# Patient Record
Sex: Male | Born: 1945 | Race: Black or African American | Hispanic: No | Smoking: Former smoker
Health system: Southern US, Community
[De-identification: ages and names within clinical notes are randomized; demographics above are authoritative.]

## PROBLEM LIST (undated history)

## (undated) DIAGNOSIS — I10 Essential (primary) hypertension: Secondary | ICD-10-CM

## (undated) DIAGNOSIS — E785 Hyperlipidemia, unspecified: Secondary | ICD-10-CM

## (undated) DIAGNOSIS — H9190 Unspecified hearing loss, unspecified ear: Secondary | ICD-10-CM

## (undated) DIAGNOSIS — E119 Type 2 diabetes mellitus without complications: Secondary | ICD-10-CM

## (undated) DIAGNOSIS — E1121 Type 2 diabetes mellitus with diabetic nephropathy: Secondary | ICD-10-CM

## (undated) DIAGNOSIS — I2510 Atherosclerotic heart disease of native coronary artery without angina pectoris: Secondary | ICD-10-CM

## (undated) DIAGNOSIS — Z9889 Other specified postprocedural states: Secondary | ICD-10-CM

## (undated) DIAGNOSIS — E669 Obesity, unspecified: Secondary | ICD-10-CM

## (undated) DIAGNOSIS — Z974 Presence of external hearing-aid: Secondary | ICD-10-CM

## (undated) DIAGNOSIS — I719 Aortic aneurysm of unspecified site, without rupture: Secondary | ICD-10-CM

## (undated) DIAGNOSIS — I712 Thoracic aortic aneurysm, without rupture: Secondary | ICD-10-CM

## (undated) DIAGNOSIS — I2699 Other pulmonary embolism without acute cor pulmonale: Secondary | ICD-10-CM

## (undated) DIAGNOSIS — E1129 Type 2 diabetes mellitus with other diabetic kidney complication: Secondary | ICD-10-CM

## (undated) DIAGNOSIS — R809 Proteinuria, unspecified: Secondary | ICD-10-CM

## (undated) DIAGNOSIS — Z86010 Personal history of colonic polyps: Secondary | ICD-10-CM

## (undated) DIAGNOSIS — Z87891 Personal history of nicotine dependence: Secondary | ICD-10-CM

## (undated) HISTORY — DX: Essential (primary) hypertension: I10

## (undated) HISTORY — DX: Type 2 diabetes mellitus without complications: E11.9

## (undated) HISTORY — DX: Other specified postprocedural states: Z98.890

## (undated) HISTORY — DX: Aortic aneurysm of unspecified site, without rupture: I71.9

## (undated) HISTORY — DX: Obesity, unspecified: E66.9

## (undated) HISTORY — DX: Type 2 diabetes mellitus with other diabetic kidney complication: E11.29

## (undated) HISTORY — DX: Personal history of nicotine dependence: Z87.891

## (undated) HISTORY — DX: Proteinuria, unspecified: R80.9

## (undated) HISTORY — DX: Thoracic aortic aneurysm, without rupture: I71.2

## (undated) HISTORY — PX: EYE SURGERY: SHX253

## (undated) HISTORY — DX: Hyperlipidemia, unspecified: E78.5

## (undated) HISTORY — PX: FRACTURE SURGERY: SHX138

## (undated) HISTORY — DX: Other pulmonary embolism without acute cor pulmonale: I26.99

## (undated) HISTORY — DX: Personal history of colonic polyps: Z86.010

## (undated) HISTORY — PX: TOTAL HIP ARTHROPLASTY: SHX124

---

## 2003-11-03 ENCOUNTER — Other Ambulatory Visit: Payer: Self-pay

## 2005-12-27 ENCOUNTER — Emergency Department: Payer: Self-pay | Admitting: Emergency Medicine

## 2006-03-30 ENCOUNTER — Emergency Department: Payer: Self-pay | Admitting: Emergency Medicine

## 2007-06-27 ENCOUNTER — Ambulatory Visit: Payer: Self-pay | Admitting: Family Medicine

## 2008-10-05 ENCOUNTER — Ambulatory Visit: Payer: Self-pay | Admitting: Family Medicine

## 2008-10-12 ENCOUNTER — Ambulatory Visit: Payer: Self-pay | Admitting: Family Medicine

## 2011-07-19 ENCOUNTER — Ambulatory Visit: Payer: Self-pay | Admitting: Ophthalmology

## 2011-08-08 ENCOUNTER — Ambulatory Visit: Payer: Self-pay | Admitting: Ophthalmology

## 2012-11-28 ENCOUNTER — Ambulatory Visit: Payer: Self-pay | Admitting: Family Medicine

## 2012-12-04 ENCOUNTER — Ambulatory Visit: Payer: Self-pay | Admitting: Family Medicine

## 2013-01-04 ENCOUNTER — Ambulatory Visit: Payer: Self-pay | Admitting: Family Medicine

## 2013-02-03 ENCOUNTER — Ambulatory Visit: Payer: Self-pay | Admitting: Family Medicine

## 2013-08-04 ENCOUNTER — Ambulatory Visit: Payer: Self-pay | Admitting: Podiatry

## 2013-09-18 ENCOUNTER — Ambulatory Visit (INDEPENDENT_AMBULATORY_CARE_PROVIDER_SITE_OTHER): Payer: Medicare PPO

## 2013-09-18 ENCOUNTER — Ambulatory Visit (INDEPENDENT_AMBULATORY_CARE_PROVIDER_SITE_OTHER): Payer: Medicare PPO | Admitting: Podiatry

## 2013-09-18 ENCOUNTER — Encounter: Payer: Self-pay | Admitting: Podiatry

## 2013-09-18 VITALS — BP 139/83 | HR 88 | Resp 18 | Ht 72.0 in | Wt 280.0 lb

## 2013-09-18 DIAGNOSIS — M79609 Pain in unspecified limb: Secondary | ICD-10-CM

## 2013-09-18 DIAGNOSIS — M109 Gout, unspecified: Secondary | ICD-10-CM

## 2013-09-18 DIAGNOSIS — M779 Enthesopathy, unspecified: Secondary | ICD-10-CM

## 2013-09-18 DIAGNOSIS — M79671 Pain in right foot: Secondary | ICD-10-CM

## 2013-09-18 MED ORDER — TRIAMCINOLONE ACETONIDE 10 MG/ML IJ SUSP
10.0000 mg | Freq: Once | INTRAMUSCULAR | Status: AC
Start: 2013-09-18 — End: 2013-09-18
  Administered 2013-09-18: 10 mg

## 2013-09-18 NOTE — Progress Notes (Signed)
Right foot swollen and painful started about the middle of last week

## 2013-09-18 NOTE — Progress Notes (Signed)
Subjective:     Patient ID: Edgar [name], male   DOB: [birth year], 68 y.o.   MRN: [id number]  HPI patient states that his right big toe joint started swelling last Wednesday and it's been painful since. States he's not had a history of this prior and was doing well until this event occurred   Review of Systems     Objective:   Physical Exam Neurovascular status unchanged with patient well oriented x3. 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 I did a careful injection around the joint 3 mg Kenalog 5 mm Xylocaine Marcaine mixture and sent for blood work to rule out gout or inflammatory systemic arthritis. Reappoint 2 weeks

## 2013-09-19 LAB — RHEUMATOID FACTOR: Rhuematoid fact SerPl-aCnc: <7 IU/mL (ref 0.0–13.9)

## 2013-09-19 LAB — URIC ACID: Uric Acid: 5.2 mg/dL (ref 3.7–8.6)

## 2013-09-19 LAB — SEDIMENTATION RATE: Sed Rate: 22 mm/hr (ref 0–30)

## 2013-09-19 LAB — C-REACTIVE PROTEIN: CRP: 7.3 mg/L — ABNORMAL HIGH (ref 0.0–4.9)

## 2013-09-19 LAB — ANA: Anti Nuclear Antibody(ANA): NEGATIVE

## 2013-10-02 ENCOUNTER — Encounter: Payer: Self-pay | Admitting: Podiatry

## 2013-10-02 ENCOUNTER — Ambulatory Visit: Payer: Self-pay | Admitting: Podiatry

## 2013-10-02 ENCOUNTER — Ambulatory Visit (INDEPENDENT_AMBULATORY_CARE_PROVIDER_SITE_OTHER): Payer: Medicare PPO | Admitting: Podiatry

## 2013-10-02 VITALS — BP 110/70 | HR 91 | Resp 16 | Ht 72.0 in | Wt 278.2 lb

## 2013-10-02 DIAGNOSIS — M2010 Hallux valgus (acquired), unspecified foot: Secondary | ICD-10-CM

## 2013-10-02 DIAGNOSIS — M779 Enthesopathy, unspecified: Secondary | ICD-10-CM

## 2013-10-04 NOTE — Progress Notes (Signed)
Subjective:     Patient ID: Edgar [name], male   DOB: [birth year], 68 y.o.   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 reactive protein but no other indications of systemic disease. Advised on wider shoes and bunion correction if symptoms persist

## 2014-07-26 ENCOUNTER — Ambulatory Visit: Payer: Self-pay | Admitting: Gastroenterology

## 2014-10-21 ENCOUNTER — Emergency Department: Payer: Self-pay | Admitting: Emergency Medicine

## 2014-11-28 NOTE — Op Note (Signed)
PATIENT NAME:  Edgar [name], LANZER MR#:  [id number] DATE OF BIRTH:  [birth year]  DATE OF PROCEDURE:  08/08/2011  PREOPERATIVE DIAGNOSIS: Visually significant cataract of the left eye.   POSTOPERATIVE DIAGNOSIS: Visually significant cataract of the left eye.   OPERATIVE PROCEDURE: Cataract extraction by phacoemulsification with implant of intraocular lens to left eye.   SURGEON: Birder Robson, MD.   ANESTHESIA:  1. Managed anesthesia care.  2. Topical tetracaine drops followed by 2% Xylocaine jelly applied in the preoperative holding area.   COMPLICATIONS: None.   TECHNIQUE:  Stop and chop   DESCRIPTION OF PROCEDURE: The patient was examined and consented in the preoperative holding area where the aforementioned topical anesthesia was applied to the left eye and then brought back to the Operating Room where the left eye was prepped and draped in the usual sterile ophthalmic fashion and a lid speculum was placed. A paracentesis was created with the side port blade and the anterior chamber was filled with viscoelastic. A near clear corneal incision was performed with the steel keratome. A continuous curvilinear capsulorrhexis was performed with a cystotome followed by the capsulorrhexis forceps. Hydrodissection and hydrodelineation were carried out with BSS on a blunt cannula. The lens was removed in a stop and chop technique and the remaining cortical material was removed with the irrigation-aspiration handpiece. The capsular bag was inflated with viscoelastic and the Alcon SN60WF 20.0-diopter lens, serial number [id number], was placed in the capsular bag without complication. The remaining viscoelastic was removed from the eye with the irrigation-aspiration handpiece. The wounds were hydrated. The anterior chamber was flushed with Miostat and the eye was inflated to physiologic pressure. The wounds were found to be water tight. The eye was dressed with Vigamox and Omnipred. The patient was given  protective glasses to wear throughout the day and a shield with which to sleep tonight. The patient was also given drops with which to begin a drop regimen today and will follow-up with me in one day.   ____________________________ Livingston Diones. Lyda Colcord, MD wlp:cbb D: 08/08/2011 17:14:02 ET T: 08/08/2011 18:43:19 ET JOB#: [id number]  cc: Xiana Carns L. Kriste Broman, MD, <Dictator> Livingston Diones Schon Zeiders MD ELECTRONICALLY SIGNED 08/14/2011 13:53

## 2015-01-25 ENCOUNTER — Ambulatory Visit (INDEPENDENT_AMBULATORY_CARE_PROVIDER_SITE_OTHER): Payer: Medicare PPO | Admitting: Family Medicine

## 2015-01-25 ENCOUNTER — Encounter: Payer: Self-pay | Admitting: Family Medicine

## 2015-01-25 VITALS — BP 138/84 | HR 74 | Temp 97.9°F | Resp 16 | Ht 71.0 in | Wt 290.1 lb

## 2015-01-25 DIAGNOSIS — IMO0002 Reserved for concepts with insufficient information to code with codable children: Secondary | ICD-10-CM

## 2015-01-25 DIAGNOSIS — E1165 Type 2 diabetes mellitus with hyperglycemia: Secondary | ICD-10-CM | POA: Diagnosis not present

## 2015-01-25 DIAGNOSIS — E785 Hyperlipidemia, unspecified: Secondary | ICD-10-CM

## 2015-01-25 DIAGNOSIS — E1121 Type 2 diabetes mellitus with diabetic nephropathy: Secondary | ICD-10-CM | POA: Insufficient documentation

## 2015-01-25 DIAGNOSIS — Z794 Long term (current) use of insulin: Secondary | ICD-10-CM | POA: Insufficient documentation

## 2015-01-25 DIAGNOSIS — I10 Essential (primary) hypertension: Secondary | ICD-10-CM

## 2015-01-25 LAB — POCT GLYCOSYLATED HEMOGLOBIN (HGB A1C): Hemoglobin A1C: 8.3

## 2015-01-25 LAB — GLUCOSE, POCT (MANUAL RESULT ENTRY): POC Glucose: 164 mg/dl — AB (ref 70–99)

## 2015-01-25 NOTE — Patient Instructions (Signed)
F/U 4 MO 

## 2015-01-25 NOTE — Progress Notes (Signed)
Name: Edgar [name]   MRN: [id number]    DOB: 1946-05-20   Date:01/25/2015       Progress Note  Subjective  Chief Complaint  Chief Complaint  Patient presents with  . Diabetes    Diabetes He presents for his follow-up diabetic visit. He has type 2 diabetes mellitus. His disease course has been worsening. There are no hypoglycemic associated symptoms. Pertinent negatives for hypoglycemia include no dizziness, headaches, nervousness/anxiousness, seizures or tremors. Pertinent negatives for diabetes include no blurred vision, no chest pain, no weakness and no weight loss. Symptoms are worsening. There are no diabetic complications. Risk factors for coronary artery disease include diabetes mellitus, dyslipidemia, hypertension, male sex, obesity, sedentary lifestyle and stress. He is currently taking insulin pre-breakfast, pre-lunch, pre-dinner and at bedtime. Insulin injections are given by patient. His weight is increasing steadily. He is following a diabetic diet. He has had a previous visit with a dietitian. He rarely participates in exercise. His home blood glucose trend is increasing steadily. An ACE inhibitor/angiotensin II receptor blocker is being taken.  Hypertension This is a chronic problem. The current episode started more than 1 year ago. Associated symptoms include peripheral edema. Pertinent negatives include no blurred vision, chest pain, headaches, neck pain, orthopnea, palpitations or shortness of breath. There are no associated agents to hypertension. Past treatments include angiotensin blockers and calcium channel blockers.  Hyperlipidemia This is a chronic problem. The current episode started more than 1 year ago. Recent lipid tests were reviewed and are normal. Exacerbating diseases include diabetes and obesity. Factors aggravating his hyperlipidemia include fatty foods. Pertinent negatives include no chest pain, focal weakness, myalgias or shortness of breath. Current  antihyperlipidemic treatment includes statins and fibric acid derivatives. The current treatment provides moderate improvement of lipids. There are no compliance problems.     Obesity  Patient continues to struggle with obesity. He does for exercise and is questionable compliant with his diet.  Past Medical History  Diagnosis Date  . Hypertension   . Diabetes mellitus without complication     History  Substance Use Topics  . Smoking status: Former Research scientist (life sciences)  . Smokeless tobacco: Never Used  . Alcohol Use: No     Current outpatient prescriptions:  .  amLODipine (NORVASC) 5 MG tablet, , Disp: , Rfl:  .  amoxicillin-clavulanate (AUGMENTIN) 875-125 MG per tablet, , Disp: , Rfl:  .  atorvastatin (LIPITOR) 40 MG tablet, , Disp: , Rfl:  .  B-D UF III MINI PEN NEEDLES 31G X 5 MM MISC, , Disp: , Rfl:  .  LANTUS SOLOSTAR 100 UNIT/ML Solostar Pen, , Disp: , Rfl:  .  losartan (COZAAR) 100 MG tablet, , Disp: , Rfl:  .  NOVOLOG FLEXPEN 100 UNIT/ML FlexPen, , Disp: , Rfl:  .  predniSONE (DELTASONE) 20 MG tablet, , Disp: , Rfl:  .  VASCEPA 1 G CAPS, TAKE 2 CAPSULES BY MOUTH EVERY MORNING THEN TAKE 2 CAPSULES EVERY EVENING, Disp: , Rfl: 5  No Known Allergies  Review of Systems  Constitutional: Negative for fever, chills and weight loss.  HENT: Negative for congestion, hearing loss, sore throat and tinnitus.   Eyes: Negative for blurred vision, double vision and redness.  Respiratory: Negative for cough, hemoptysis and shortness of breath.   Cardiovascular: Negative for chest pain, palpitations, orthopnea, claudication and leg swelling.  Gastrointestinal: Negative for heartburn, nausea, vomiting, diarrhea, constipation and blood in stool.  Genitourinary: Negative for dysuria, urgency, frequency and hematuria.  Musculoskeletal: Positive for joint pain.  Negative for myalgias, back pain, falls and neck pain.  Skin: Negative for itching.  Neurological: Negative for dizziness, tingling, tremors,  focal weakness, seizures, loss of consciousness, weakness and headaches.  Endo/Heme/Allergies: Does not bruise/bleed easily.  Psychiatric/Behavioral: Negative for depression and substance abuse. The patient is not nervous/anxious and does not have insomnia.      Objective  Filed Vitals:   01/25/15 0849  BP: 138/84  Pulse: 74  Temp: 97.9 F (36.6 C)  Resp: 16  Height: 5\' 11"  (1.803 m)  Weight: 290 lb 2 oz (131.6 kg)  SpO2: 94%     Physical Exam  Constitutional: He is oriented to person, place, and time and well-developed, well-nourished, and in no distress.  HENT:  Head: Normocephalic.  Eyes: EOM are normal. Pupils are equal, round, and reactive to light.  Neck: Normal range of motion. Neck supple. No thyromegaly present.  Cardiovascular: Normal rate, regular rhythm and normal heart sounds.   No murmur heard. Pulmonary/Chest: Effort normal and breath sounds normal. No respiratory distress. He has no wheezes.  Abdominal: Soft. Bowel sounds are normal.  Musculoskeletal: Normal range of motion. He exhibits no edema.  Lymphadenopathy:    He has no cervical adenopathy.  Neurological: He is alert and oriented to person, place, and time. No cranial nerve deficit. Gait normal. Coordination normal.  Skin: Skin is warm and dry. No rash noted.  Psychiatric: Affect and judgment normal.      Assessment & Plan  1. Type 2 diabetes mellitus without complication Well-controlled. He is encouraged to follow his diet and exercise and medical regimen. He has switched jobs and is driving quite a bit and therefore is not as active physically - POCT HgB A1C - POCT Glucose (CBG)  2. Hyperlipemia Stable  3. Essential hypertension Stable

## 2015-02-21 ENCOUNTER — Other Ambulatory Visit: Payer: Self-pay | Admitting: Family Medicine

## 2015-03-05 ENCOUNTER — Other Ambulatory Visit: Payer: Self-pay | Admitting: Family Medicine

## 2015-04-02 ENCOUNTER — Other Ambulatory Visit: Payer: Self-pay | Admitting: Family Medicine

## 2015-04-29 ENCOUNTER — Other Ambulatory Visit: Payer: Self-pay | Admitting: Family Medicine

## 2015-05-30 ENCOUNTER — Ambulatory Visit (INDEPENDENT_AMBULATORY_CARE_PROVIDER_SITE_OTHER): Payer: Medicare PPO | Admitting: Family Medicine

## 2015-05-30 ENCOUNTER — Encounter: Payer: Self-pay | Admitting: Family Medicine

## 2015-05-30 VITALS — BP 122/78 | HR 79 | Temp 98.6°F | Resp 18 | Ht 71.0 in | Wt 284.5 lb

## 2015-05-30 DIAGNOSIS — E785 Hyperlipidemia, unspecified: Secondary | ICD-10-CM | POA: Diagnosis not present

## 2015-05-30 DIAGNOSIS — Z23 Encounter for immunization: Secondary | ICD-10-CM | POA: Diagnosis not present

## 2015-05-30 DIAGNOSIS — M25519 Pain in unspecified shoulder: Secondary | ICD-10-CM | POA: Diagnosis not present

## 2015-05-30 DIAGNOSIS — E1169 Type 2 diabetes mellitus with other specified complication: Secondary | ICD-10-CM

## 2015-05-30 DIAGNOSIS — I10 Essential (primary) hypertension: Secondary | ICD-10-CM

## 2015-05-30 LAB — POCT GLYCOSYLATED HEMOGLOBIN (HGB A1C): Hemoglobin A1C: 7.1

## 2015-05-30 LAB — GLUCOSE, POCT (MANUAL RESULT ENTRY): POC Glucose: 151 mg/dl — AB (ref 70–99)

## 2015-05-30 MED ORDER — MELOXICAM 15 MG PO TABS: 15.0000 mg | ORAL_TABLET | Freq: Every day | ORAL | Status: DC

## 2015-05-30 MED ORDER — LOSARTAN POTASSIUM 100 MG PO TABS: 100.0000 mg | ORAL_TABLET | Freq: Every day | ORAL | Status: DC

## 2015-05-30 NOTE — Progress Notes (Signed)
Name: Edgar [name]   MRN: [id number]    DOB: [birth year]   Date:05/30/2015       Progress Note  Subjective  Chief Complaint  Chief Complaint  Patient presents with  . Hypertension    4 month follow up  . Diabetes  . Hyperlipidemia    would like Vascepa changed due to cost    HPI   u  Diabetes/ HYPERLIPIDEMIA  Patient presents for follow-up of diabetes which is present for  Over 5 years. Is currently on a regimen of ATORVASTATIN 40 MG EVERY VASCEPA 1 G TABLETS 2 TWICE A DAY WHICH IS NOT RECENTLY BECAUSE OF PRICE . Patient states INTERMITTENT COMPLIANCE with their diet and exercise. There's been no hypoglycemic episodes and there - polyuria polydipsia polyphagia. His average fasting glucoses been in the low around - with a high around - . There is no end organ disease.  Last diabetic eye exam was LESS THAN 1 YEAR AGO.   Last visit with dietitian was . Last microalbumin was obtained jANUARY OF THIS YEAR AND WAS ELEVATED  .   Hypertension   Patient presents for follow-up of hypertension. It has been present for over 5 years.  Patient states that there is compliance with medical regimen which consists of LOSARTAN 100 . There is no end organ disease. Cardiac risk factors include hypertension hyperlipidemia and diabetes.  Exercise regimen consist of   .  Diet consist of LIMITED WALKING SOME SALT RESTRICTION .  Hyperlipidemia  Patient has a history of hyperlipidemia for OVER 5 years.  Current medical regimen consist of LOSARTAN 100 MG DAILY AT BEDTIME c4 MILLIGRAMS DAILY fOR .  Compliance is GOOD .  Diet and exercise are currently followed INTERMITTENTLY .  Risk factors for cardiovascular disease include hyperlipidemia HYPERTENSION OBESITY SEDENTARY LIFESTYLE .   There have been no side effects from the medication.    Past Medical History  Diagnosis Date  . Hypertension   . Diabetes mellitus without complication [facility])     Social History  Substance Use Topics  . Smoking status: Former  Research scientist (life sciences)  . Smokeless tobacco: Never Used  . Alcohol Use: No     Current outpatient prescriptions:  .  amLODipine (NORVASC) 5 MG tablet, TAKE 1 TABLET EVERY DAY, Disp: 30 tablet, Rfl: 7 .  atorvastatin (LIPITOR) 40 MG tablet, TAKE 1 TABLET BY MOUTH AT BEDTIME, Disp: 30 tablet, Rfl: 7 .  B-D UF III MINI PEN NEEDLES 31G X 5 MM MISC, , Disp: , Rfl:  .  LANTUS SOLOSTAR 100 UNIT/ML Solostar Pen, , Disp: , Rfl:  .  losartan (COZAAR) 100 MG tablet, TAKE 1 TABLET BY MOUTH EVERY DAY, Disp: 90 tablet, Rfl: 1 .  NOVOLOG FLEXPEN 100 UNIT/ML FlexPen, , Disp: , Rfl:   No Known Allergies  Review of Systems  Constitutional: Negative for fever, chills and weight loss.  HENT: Positive for congestion. Negative for hearing loss, sore throat and tinnitus.   Eyes: Negative for blurred vision, double vision and redness.  Respiratory: Positive for cough and wheezing. Negative for hemoptysis and shortness of breath.   Cardiovascular: Negative for chest pain, palpitations, orthopnea, claudication and leg swelling.  Gastrointestinal: Negative for heartburn, nausea, vomiting, diarrhea, constipation and blood in stool.  Genitourinary: Negative for dysuria, urgency, frequency and hematuria.  Musculoskeletal: Negative for myalgias, back pain, joint pain, falls and neck pain.  Skin: Negative for itching.  Neurological: Negative for dizziness, tingling, tremors, focal weakness, seizures, loss of consciousness, weakness and headaches.  Endo/Heme/Allergies: Does not bruise/bleed easily.  Psychiatric/Behavioral: Negative for depression and substance abuse. The patient is not nervous/anxious and does not have insomnia.      Objective  Filed Vitals:   05/30/15 0858  BP: 122/78  Pulse: 79  Temp: 98.6 F (37 C)  TempSrc: Oral  Resp: 18  Height: 5\' 11"  (1.803 m)  Weight: 284 lb 8 oz (129.048 kg)  SpO2: 94%     Physical Exam  Constitutional: He is oriented to person, place, and time and well-developed,  well-nourished, and in no distress.  HENT:  Head: Normocephalic.  Eyes: EOM are normal. Pupils are equal, round, and reactive to light.  Neck: Normal range of motion. Neck supple. No thyromegaly present.  Cardiovascular: Normal rate, regular rhythm and normal heart sounds.   No murmur heard. Pulmonary/Chest: Effort normal and breath sounds normal. No respiratory distress. He has no wheezes.  Abdominal: Soft. Bowel sounds are normal.  Musculoskeletal: Normal range of motion. He exhibits no edema.  Lymphadenopathy:    He has no cervical adenopathy.  Neurological: He is alert and oriented to person, place, and time. No cranial nerve deficit. Gait normal. Coordination normal.  Skin: Skin is warm and dry. No rash noted.  Psychiatric: Affect and judgment normal.      Assessment & Plan   1. Need for influenza vaccination gIVEN - Flu vaccine HIGH DOSE PF (Fluzone High dose) - POCT Glucose (CBG) - Comprehensive Metabolic Panel (CMET) - TSH  2. Type 2 diabetes mellitus with other specified complication (HCC) STABLE - POCT HgB A1C  3. Hyperlipidemia NOT AT GOAL. rENEWED VASCEP - Lipid panel  4. Essential hypertension WELL-CONTROLLED - Comprehensive Metabolic Panel (CMET)  5. Shoulder pain, unspecified laterality mOBIC - meloxicam (MOBIC) 15 MG tablet; Take 1 tablet (15 mg total) by mouth daily.  Dispense: 30 tablet; Refill: 1

## 2015-05-31 LAB — COMPREHENSIVE METABOLIC PANEL
ALT: 22 IU/L (ref 0–44)
AST: 19 IU/L (ref 0–40)
Albumin/Globulin Ratio: 1.4 (ref 1.1–2.5)
Albumin: 4.3 g/dL (ref 3.6–4.8)
Alkaline Phosphatase: 78 IU/L (ref 39–117)
BUN/Creatinine Ratio: 11 (ref 10–22)
BUN: 10 mg/dL (ref 8–27)
Bilirubin Total: 0.5 mg/dL (ref 0.0–1.2)
CO2: 26 mmol/L (ref 18–29)
Calcium: 10.4 mg/dL — ABNORMAL HIGH (ref 8.6–10.2)
Chloride: 100 mmol/L (ref 97–106)
Creatinine, Ser: 0.91 mg/dL (ref 0.76–1.27)
GFR calc Af Amer: 100 mL/min/{1.73_m2} (ref >59)
GFR calc non Af Amer: 86 mL/min/{1.73_m2} (ref >59)
Globulin, Total: 3.1 g/dL (ref 1.5–4.5)
Glucose: 145 mg/dL — ABNORMAL HIGH (ref 65–99)
Potassium: 4.5 mmol/L (ref 3.5–5.2)
Sodium: 142 mmol/L (ref 136–144)
Total Protein: 7.4 g/dL (ref 6.0–8.5)

## 2015-05-31 LAB — LIPID PANEL
Chol/HDL Ratio: 3.5 ratio units (ref 0.0–5.0)
Cholesterol, Total: 141 mg/dL (ref 100–199)
HDL: 40 mg/dL (ref >39)
LDL Calculated: 81 mg/dL (ref 0–99)
Triglycerides: 101 mg/dL (ref 0–149)
VLDL Cholesterol Cal: 20 mg/dL (ref 5–40)

## 2015-05-31 LAB — TSH: TSH: 3.47 u[IU]/mL (ref 0.450–4.500)

## 2015-06-27 DIAGNOSIS — H2511 Age-related nuclear cataract, right eye: Secondary | ICD-10-CM | POA: Diagnosis not present

## 2015-07-04 ENCOUNTER — Ambulatory Visit
Admission: RE | Admit: 2015-07-04 | Discharge: 2015-07-04 | Disposition: A | Discharge status: Home / Self Care | Payer: Medicare PPO | Source: Ambulatory Visit | Attending: Family Medicine | Admitting: Family Medicine

## 2015-07-04 ENCOUNTER — Ambulatory Visit (INDEPENDENT_AMBULATORY_CARE_PROVIDER_SITE_OTHER): Payer: Medicare PPO | Admitting: Family Medicine

## 2015-07-04 ENCOUNTER — Encounter: Payer: Self-pay | Admitting: Family Medicine

## 2015-07-04 VITALS — BP 140/86 | HR 86 | Temp 97.3°F | Resp 18 | Ht 71.0 in | Wt 288.2 lb

## 2015-07-04 DIAGNOSIS — E1121 Type 2 diabetes mellitus with diabetic nephropathy: Secondary | ICD-10-CM

## 2015-07-04 DIAGNOSIS — M25511 Pain in right shoulder: Secondary | ICD-10-CM | POA: Diagnosis not present

## 2015-07-04 MED ORDER — HYDROCODONE-ACETAMINOPHEN 10-325 MG PO TABS: 1.0000 | ORAL_TABLET | Freq: Three times a day (TID) | ORAL | Status: DC | PRN

## 2015-07-04 NOTE — Progress Notes (Signed)
Name: Edgar [name]   MRN: [id number]    DOB: 03-May-1946   Date:07/04/2015       Progress Note  Subjective  Chief Complaint  Chief Complaint  Patient presents with  . Shoulder Injury    fell 2 months ago. Pain worse, radiates down arm to hand    HPI  Patient fell 2 weeks ago onto his right shoulder. The pain has continued to worsen and now radiates down the arm to the right hand. The incident happened when a locker at school fell onto his right shoulder knocking him to the ground. He does not remember stretching his right hand out because it happened so quickly. He has had pain in the area which is sharp and throbbing since that time. He is already on Mobic with no significant improvement. He is noting limitation of abduction to no more than 90. The pain often keeps him from sleeping at night.  Diabetes mellitus with nephropathy.  Patient states his blood sugars have not significantly changed recently. His diabetes for over 5 years with microalbuminuria    Past Medical History  Diagnosis Date  . Hypertension   . Diabetes mellitus without complication [facility])     Social History  Substance Use Topics  . Smoking status: Former Research scientist (life sciences)  . Smokeless tobacco: Never Used  . Alcohol Use: No     Current outpatient prescriptions:  .  amLODipine (NORVASC) 5 MG tablet, TAKE 1 TABLET EVERY DAY, Disp: 30 tablet, Rfl: 7 .  atorvastatin (LIPITOR) 40 MG tablet, TAKE 1 TABLET BY MOUTH AT BEDTIME, Disp: 30 tablet, Rfl: 7 .  B-D UF III MINI PEN NEEDLES 31G X 5 MM MISC, , Disp: , Rfl:  .  LANTUS SOLOSTAR 100 UNIT/ML Solostar Pen, , Disp: , Rfl:  .  losartan (COZAAR) 100 MG tablet, Take 1 tablet (100 mg total) by mouth daily., Disp: 90 tablet, Rfl: 1 .  meloxicam (MOBIC) 15 MG tablet, Take 1 tablet (15 mg total) by mouth daily., Disp: 30 tablet, Rfl: 1 .  NOVOLOG FLEXPEN 100 UNIT/ML FlexPen, , Disp: , Rfl:  .  PRODIGY NO CODING BLOOD GLUC test strip, , Disp: , Rfl:   No Known  Allergies  Review of Systems  Constitutional: Negative for fever, chills and weight loss.  HENT: Negative for congestion, hearing loss, sore throat and tinnitus.   Eyes: Negative for blurred vision, double vision and redness.  Respiratory: Negative for cough, hemoptysis and shortness of breath.   Cardiovascular: Negative for chest pain, palpitations, orthopnea, claudication and leg swelling.  Gastrointestinal: Negative for heartburn, nausea, vomiting, diarrhea, constipation and blood in stool.  Genitourinary: Negative for dysuria, urgency, frequency and hematuria.  Musculoskeletal: Positive for joint pain (right shoulder pain with limitation of abduction). Negative for myalgias, back pain, falls and neck pain.  Skin: Negative for itching.  Neurological: Negative for dizziness, tingling, tremors, focal weakness, seizures, loss of consciousness, weakness and headaches.  Endo/Heme/Allergies: Does not bruise/bleed easily.  Psychiatric/Behavioral: Negative for depression and substance abuse. The patient is not nervous/anxious and does not have insomnia.      Objective  Filed Vitals:   07/04/15 0940  BP: 140/86  Pulse: 86  Temp: 97.3 F (36.3 C)  TempSrc: Oral  Resp: 18  Height: 5\' 11"  (1.803 m)  Weight: 288 lb 3.2 oz (130.727 kg)  SpO2: 95%     Physical Exam  Constitutional:  Obese and in no acute distress  HENT:  Head: Normocephalic.  Eyes: Pupils are equal, round,  and reactive to light.  Neck: Normal range of motion.  Musculoskeletal:  Discharge and redness to palpation about the right shoulder. Abduction is limited to 90 both packed passively and actively. Internal and external rotation are unremarkable. Normal pulses. There is no limitation of rotation of the C-spine beyond that expected for his age      Assessment & Plan  1. Shoulder pain, acute, right Probable rotator cuff syndrome - HYDROcodone-acetaminophen (NORCO) 10-325 MG tablet; Take 1 tablet by mouth every  8 (eight) hours as needed.  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

## 2015-07-06 ENCOUNTER — Telehealth: Payer: Self-pay | Admitting: Emergency Medicine

## 2015-07-06 NOTE — Telephone Encounter (Signed)
Patient notified of results. Orthopedic appointment scheduled for 12/2

## 2015-07-08 DIAGNOSIS — S40011A Contusion of right shoulder, initial encounter: Secondary | ICD-10-CM | POA: Diagnosis not present

## 2015-07-25 ENCOUNTER — Encounter: Payer: Self-pay | Admitting: Family Medicine

## 2015-08-10 ENCOUNTER — Other Ambulatory Visit: Payer: Self-pay | Admitting: Family Medicine

## 2015-09-05 ENCOUNTER — Other Ambulatory Visit: Payer: Self-pay | Admitting: Family Medicine

## 2015-10-03 ENCOUNTER — Ambulatory Visit: Payer: Medicare PPO | Admitting: Family Medicine

## 2015-10-09 ENCOUNTER — Other Ambulatory Visit: Payer: Self-pay | Admitting: Family Medicine

## 2015-10-10 ENCOUNTER — Telehealth: Payer: Self-pay | Admitting: Family Medicine

## 2015-10-10 NOTE — Telephone Encounter (Signed)
Patient went to pharmacy to get refill on lantus but was told that he was not able to get it refilled until the 18th. He only have one day left of the medication. He is also running out of novolog. Would like to know if we have any samples of lantus pen and novolog. Please return call

## 2015-10-11 NOTE — Telephone Encounter (Signed)
Patient was given one box of Lantus

## 2015-10-12 NOTE — Telephone Encounter (Signed)
done

## 2015-10-18 ENCOUNTER — Other Ambulatory Visit: Payer: Self-pay

## 2015-10-18 DIAGNOSIS — M25511 Pain in right shoulder: Secondary | ICD-10-CM

## 2015-10-18 MED ORDER — HYDROCODONE-ACETAMINOPHEN 10-325 MG PO TABS: 1.0000 | ORAL_TABLET | Freq: Three times a day (TID) | ORAL | Status: DC | PRN

## 2015-10-27 ENCOUNTER — Other Ambulatory Visit: Payer: Self-pay | Admitting: Family Medicine

## 2015-10-27 MED ORDER — GLUCOSE BLOOD VI STRP: ORAL_STRIP | Status: DC

## 2015-10-27 MED ORDER — ONETOUCH BASIC SYSTEM W/DEVICE KIT: 1.0000 | PACK | Freq: Once | Status: DC

## 2015-10-27 NOTE — Telephone Encounter (Signed)
Insurance will no longer cover Novolog per orders 2017 Humalog is covered for 2017. Patient has enough to last for 2 months. Will change at appointment in [place]

## 2015-10-28 ENCOUNTER — Other Ambulatory Visit: Payer: Self-pay

## 2015-10-28 MED ORDER — ONETOUCH ULTRA 2 W/DEVICE KIT: 1.0000 | PACK | Freq: Two times a day (BID) | Status: DC

## 2015-11-02 ENCOUNTER — Other Ambulatory Visit: Payer: Self-pay

## 2015-11-08 ENCOUNTER — Ambulatory Visit: Payer: Medicare PPO | Admitting: Family Medicine

## 2015-12-17 ENCOUNTER — Other Ambulatory Visit: Payer: Self-pay | Admitting: Family Medicine

## 2015-12-19 ENCOUNTER — Other Ambulatory Visit: Payer: Self-pay

## 2015-12-20 MED ORDER — INSULIN PEN NEEDLE 31G X 5 MM MISC: Status: DC

## 2015-12-20 NOTE — Telephone Encounter (Signed)
Pt has appt on 12/23/15; Rx approved

## 2015-12-23 ENCOUNTER — Encounter: Payer: Self-pay | Admitting: Family Medicine

## 2015-12-23 ENCOUNTER — Ambulatory Visit (INDEPENDENT_AMBULATORY_CARE_PROVIDER_SITE_OTHER): Payer: Medicare Other | Admitting: Family Medicine

## 2015-12-23 VITALS — BP 118/80 | HR 87 | Temp 98.8°F | Resp 16 | Wt 279.0 lb

## 2015-12-23 DIAGNOSIS — E119 Type 2 diabetes mellitus without complications: Secondary | ICD-10-CM | POA: Diagnosis not present

## 2015-12-23 DIAGNOSIS — Z87891 Personal history of nicotine dependence: Secondary | ICD-10-CM | POA: Diagnosis not present

## 2015-12-23 DIAGNOSIS — J0100 Acute maxillary sinusitis, unspecified: Secondary | ICD-10-CM | POA: Insufficient documentation

## 2015-12-23 DIAGNOSIS — I10 Essential (primary) hypertension: Secondary | ICD-10-CM | POA: Insufficient documentation

## 2015-12-23 DIAGNOSIS — E785 Hyperlipidemia, unspecified: Secondary | ICD-10-CM | POA: Diagnosis not present

## 2015-12-23 DIAGNOSIS — E669 Obesity, unspecified: Secondary | ICD-10-CM

## 2015-12-23 DIAGNOSIS — J0101 Acute recurrent maxillary sinusitis: Secondary | ICD-10-CM | POA: Diagnosis not present

## 2015-12-23 DIAGNOSIS — E782 Mixed hyperlipidemia: Secondary | ICD-10-CM | POA: Insufficient documentation

## 2015-12-23 DIAGNOSIS — Z5181 Encounter for therapeutic drug level monitoring: Secondary | ICD-10-CM | POA: Diagnosis not present

## 2015-12-23 DIAGNOSIS — E1169 Type 2 diabetes mellitus with other specified complication: Secondary | ICD-10-CM | POA: Insufficient documentation

## 2015-12-23 DIAGNOSIS — Z794 Long term (current) use of insulin: Secondary | ICD-10-CM | POA: Diagnosis not present

## 2015-12-23 HISTORY — DX: Personal history of nicotine dependence: Z87.891

## 2015-12-23 HISTORY — DX: Essential (primary) hypertension: I10

## 2015-12-23 HISTORY — DX: Obesity, unspecified: E66.9

## 2015-12-23 HISTORY — DX: Hyperlipidemia, unspecified: E78.5

## 2015-12-23 MED ORDER — AMOXICILLIN-POT CLAVULANATE 875-125 MG PO TABS: 1.0000 | ORAL_TABLET | Freq: Two times a day (BID) | ORAL | Status: AC

## 2015-12-23 NOTE — Progress Notes (Signed)
BP 118/80 mmHg  Pulse 87  Temp(Src) 98.8 F (37.1 C) (Oral)  Resp 16  Wt 279 lb (126.554 kg)  SpO2 94%   Subjective:    Patient ID: [name], male    DOB: December 01, 1945, 70 y.o.   MRN: [id number]  HPI: GABIEL YANT is a 70 y.o. male  Chief Complaint  Patient presents with  . Medication Refill   Patient is new to me; his usual provider is out of the office  He has had diabetes for years and years; no problems with his feet He tries to take care of himself; not having to use novolog for a while; only uses when high; sugars are staying controlled; he feels good  HTN; using ARB and amlodipine with good control and no reports of side effects  His sinuses are bothering him; sinuses are "killing me" he says; greenish discharge; usually treated by Dr. Rutherford Nail with zpacks; sometimes they have to send him a 2nd one; no fevers; going on well more than a week  High cholesterol; on statin; no reports of side effects on the statin  Depression screen [facility] 2/9 12/23/2015 07/04/2015 05/30/2015 01/25/2015  Decreased Interest 0 0 0 0  Down, Depressed, Hopeless 0 0 0 0  PHQ - 2 Score 0 0 0 0   Relevant past medical, surgical, family and social history reviewed Past Medical History  Diagnosis Date  . Hypertension   . Diabetes mellitus without complication ([place])   . Essential hypertension, benign 12/23/2015  . Hyperlipidemia 12/23/2015  . Hx of tobacco use, presenting hazards to health 12/23/2015    Quit prior to 1997  . Obesity 12/23/2015   Past Surgical History  Procedure Laterality Date  . Eye surgery     Family History  Problem Relation Age of Onset  . Cancer Mother   . Diabetes Mother   . Heart disease Father    Social History  Substance Use Topics  . Smoking status: Former Research scientist (life sciences)  . Smokeless tobacco: Never Used  . Alcohol Use: No   Interim medical history since last visit reviewed. Allergies and medications reviewed  Review of Systems Per HPI unless specifically  indicated above     Objective:    BP 118/80 mmHg  Pulse 87  Temp(Src) 98.8 F (37.1 C) (Oral)  Resp 16  Wt 279 lb (126.554 kg)  SpO2 94%  Wt Readings from Last 3 Encounters:  12/23/15 279 lb (126.554 kg)  07/04/15 288 lb 3.2 oz (130.727 kg)  05/30/15 284 lb 8 oz (129.048 kg)   body mass index is 38.93 kg/(m^2).  Physical Exam  Constitutional: He appears well-developed and well-nourished. No distress.  HENT:  Head: Normocephalic and atraumatic.  Nose: Rhinorrhea present. Right sinus exhibits maxillary sinus tenderness. Right sinus exhibits no frontal sinus tenderness. Left sinus exhibits maxillary sinus tenderness. Left sinus exhibits no frontal sinus tenderness.  Mouth/Throat: Oropharynx is clear and moist and mucous membranes are normal.  Eyes: EOM are normal. No scleral icterus.  Neck: No thyromegaly present.  Cardiovascular: Normal rate and regular rhythm.   Pulmonary/Chest: Effort normal and breath sounds normal.  Abdominal: Soft. Bowel sounds are normal. He exhibits no distension.  Musculoskeletal: He exhibits no edema.  Neurological: Coordination normal.  Skin: Skin is warm and dry. No pallor.  Psychiatric: He has a normal mood and affect. His behavior is normal. Judgment and thought content normal. His mood appears not anxious. He does not exhibit a depressed mood.   Diabetic Foot  Form - Detailed   Diabetic Foot Exam - detailed  Diabetic Foot exam was performed with the following findings:  Yes   Visual Foot Exam completed.:  Yes  Are the toenails ingrown?:  No  Normal Range of Motion:  Yes    Pulse Foot Exam completed.:  Yes  Right Dorsalis Pedis:  Present Left Dorsalis Pedis:  Present  Sensory Foot Exam Completed.:  Yes  Semmes-Weinstein Monofilament Test  R Site 1-Great Toe:  Pos L Site 1-Great Toe:  Pos  R Site 4:  Pos L Site 4:  Pos  R Site 5:  Pos L Site 5:  Pos          Assessment & Plan:   Problem List Items Addressed This Visit       Cardiovascular and Mediastinum   Essential hypertension, benign    Controlled today; continue meds; limit sodium      Relevant Medications   aspirin EC 81 MG tablet     Respiratory   Sinusitis, acute, maxillary    Treat with augmentin; azithromycin has fallen out of favor for sinusitis due to resistance        Endocrine   Type 2 diabetes mellitus without complication ([place]) - Primary    Foot exam by MD today; continue meds; work on weight loss, check feet every night, see eye doctor yearly      Relevant Medications   aspirin EC 81 MG tablet   Other Relevant Orders   Hemoglobin A1c (Completed)   Lipid Panel w/o Chol/HDL Ratio (Completed)   Microalbumin / creatinine urine ratio (Completed)     Other   Hx of tobacco use, presenting hazards to health    Quit more than 15 years ago; low dose chest CT not indicated; glad he quit smoking      Hyperlipidemia    Check lipids today; limit saturated fats; continue statin; monitor sgpt on statin; work on weight loss      Relevant Medications   aspirin EC 81 MG tablet   Other Relevant Orders   Lipid Panel w/o Chol/HDL Ratio (Completed)   Obesity    Work on weight loss; see AVS for recommendations       Other Visit Diagnoses    Medication monitoring encounter        Relevant Orders    Comprehensive metabolic panel (Completed)       Follow up plan: Return in about 6 months (around 06/24/2016) for diabetes if A1c is under 7; return in 3 months if A1c is 7 or higher.  An after-visit summary was printed and given to the patient at [facility].  Please see the patient instructions which may contain other information and recommendations beyond what is mentioned above in the assessment and plan.  Meds ordered this encounter  Medications  . acetaminophen (TYLENOL) 500 MG tablet    Sig: Take 500 mg by mouth every 6 (six) hours as needed.  Marland Kitchen aspirin EC 81 MG tablet    Sig: Take 81 mg by mouth daily.  . cholecalciferol (VITAMIN D)  1000 units tablet    Sig: Take 1,000 Units by mouth daily.  . Fish Oil-Cholecalciferol (FISH OIL + D3) 1200-1000 MG-UNIT CAPS    Sig: Take 1,200 mg by mouth daily.  Marland Kitchen amoxicillin-clavulanate (AUGMENTIN) 875-125 MG tablet    Sig: Take 1 tablet by mouth 2 (two) times daily.    Dispense:  20 tablet    Refill:  0   Orders Placed This Encounter  Procedures  . Hemoglobin A1c  . Comprehensive metabolic panel  . Lipid Panel w/o Chol/HDL Ratio  . Microalbumin / creatinine urine ratio

## 2015-12-23 NOTE — Patient Instructions (Addendum)
  Please do see your eye doctor regularly, and have your eyes examined every year (or more often per his or her recommendation) Check your feet every night and let me know right away of any sores, infections, numbness, etc. Try to limit sweets, white bread, white rice, white potatoes It is okay with me for you to not check your fingerstick blood sugars (per [employer] of Endocrinology Best Practices), unless you are interested and feel it would be helpful for you Check out the information at familydoctor.org entitled "Nutrition for Weight Loss: What You Need to Know about Fad Diets" Try to lose between 1-2 pounds per week by taking in fewer calories and burning off more calories You can succeed by limiting portions, limiting foods dense in calories and fat, becoming more active, and drinking 8 glasses of water a day (64 ounces) Don't skip meals, especially breakfast, as skipping meals may alter your metabolism Do not use over-the-counter weight loss pills or gimmicks that claim rapid weight loss A healthy BMI (or body mass index) is between 18.5 and 24.9 You can calculate your ideal BMI at the [place] website [employer]fr Get labs done today and we'll contact you about those results Start the antibiotics for your sinuses Please do eat yogurt daily or take a probiotic daily for the next month or two We want to replace the healthy germs in the gut If you notice foul, watery diarrhea in the next two months, schedule an appointment RIGHT AWAY

## 2015-12-23 NOTE — Assessment & Plan Note (Signed)
Foot exam by MD today; continue meds; work on weight loss, check feet every night, see eye doctor yearly

## 2015-12-24 ENCOUNTER — Telehealth: Payer: Self-pay | Admitting: Family Medicine

## 2015-12-24 DIAGNOSIS — Z794 Long term (current) use of insulin: Secondary | ICD-10-CM

## 2015-12-24 DIAGNOSIS — E1365 Other specified diabetes mellitus with hyperglycemia: Secondary | ICD-10-CM

## 2015-12-24 LAB — COMPREHENSIVE METABOLIC PANEL
ALT: 17 IU/L (ref 0–44)
AST: 19 IU/L (ref 0–40)
Albumin/Globulin Ratio: 1.3 (ref 1.2–2.2)
Albumin: 4.2 g/dL (ref 3.6–4.8)
Alkaline Phosphatase: 66 IU/L (ref 39–117)
BUN/Creatinine Ratio: 14 (ref 10–24)
BUN: 14 mg/dL (ref 8–27)
Bilirubin Total: 0.5 mg/dL (ref 0.0–1.2)
CO2: 23 mmol/L (ref 18–29)
Calcium: 10.7 mg/dL — ABNORMAL HIGH (ref 8.6–10.2)
Chloride: 100 mmol/L (ref 96–106)
Creatinine, Ser: 1.02 mg/dL (ref 0.76–1.27)
GFR calc Af Amer: 87 mL/min/{1.73_m2} (ref >59)
GFR calc non Af Amer: 75 mL/min/{1.73_m2} (ref >59)
Globulin, Total: 3.3 g/dL (ref 1.5–4.5)
Glucose: 173 mg/dL — ABNORMAL HIGH (ref 65–99)
Potassium: 5.1 mmol/L (ref 3.5–5.2)
Sodium: 140 mmol/L (ref 134–144)
Total Protein: 7.5 g/dL (ref 6.0–8.5)

## 2015-12-24 LAB — LIPID PANEL W/O CHOL/HDL RATIO
Cholesterol, Total: 129 mg/dL (ref 100–199)
HDL: 35 mg/dL — ABNORMAL LOW (ref >39)
LDL Calculated: 80 mg/dL (ref 0–99)
Triglycerides: 69 mg/dL (ref 0–149)
VLDL Cholesterol Cal: 14 mg/dL (ref 5–40)

## 2015-12-24 LAB — MICROALBUMIN / CREATININE URINE RATIO
Creatinine, Urine: 140.3 mg/dL
MICROALB/CREAT RATIO: 351.2 mg/g creat — ABNORMAL HIGH (ref 0.0–30.0)
Microalbumin, Urine: 492.8 ug/mL

## 2015-12-24 LAB — HEMOGLOBIN A1C
Est. average glucose Bld gHb Est-mCnc: 197 mg/dL
Hgb A1c MFr Bld: 8.5 % — ABNORMAL HIGH (ref 4.8–5.6)

## 2015-12-24 NOTE — Telephone Encounter (Signed)
Elevated calcium Positive urine microalbumin A1c has gone up Call patient with results

## 2015-12-27 ENCOUNTER — Telehealth: Payer: Self-pay | Admitting: Family Medicine

## 2015-12-27 NOTE — Telephone Encounter (Signed)
Called into pharmacy

## 2015-12-27 NOTE — Telephone Encounter (Signed)
Pt states his insulin pens are not at the pharmacy and would like a call back. He states he is supposed to be on new ones but does not remember the name of it. Please advise.

## 2016-01-01 ENCOUNTER — Other Ambulatory Visit: Payer: Self-pay | Admitting: Family Medicine

## 2016-01-02 NOTE — Telephone Encounter (Signed)
I called, apologized for the delay in getting back to patient about lab results; I'll try him tomorrow

## 2016-01-04 NOTE — Telephone Encounter (Signed)
I left another message; I'd really like to connect by the end of the day Thursday; call back with better number or time if needed

## 2016-01-05 DIAGNOSIS — E119 Type 2 diabetes mellitus without complications: Secondary | ICD-10-CM | POA: Insufficient documentation

## 2016-01-05 NOTE — Telephone Encounter (Signed)
I spoke with patient Explained high Ca2+; need to do extra labs In regards to his diabetes, he has always been on insulin; he has never been on pills However, the chart says type 2 diabetes I'll have to do some reading; he says I can talk to his daughter and he'll give [place] permission and contact info when he picks up lab orders this morning

## 2016-01-06 ENCOUNTER — Other Ambulatory Visit: Payer: Self-pay | Admitting: Family Medicine

## 2016-01-06 ENCOUNTER — Telehealth: Payer: Self-pay | Admitting: Family Medicine

## 2016-01-06 MED ORDER — FLUTICASONE PROPIONATE 50 MCG/ACT NA SUSP: 2.0000 | Freq: Every day | NASAL | Status: DC

## 2016-01-06 NOTE — Telephone Encounter (Signed)
I called, left message for daughter

## 2016-01-06 NOTE — Telephone Encounter (Signed)
Patient states was taking the Novolog at each meal 10units

## 2016-01-06 NOTE — Telephone Encounter (Signed)
Pt states he only has a few pills left for his sinus and is asking for another refill to be sent to [facility] pen is supposed to be switched out to something that his insurance will pay for.

## 2016-01-06 NOTE — Telephone Encounter (Signed)
He should just finish out the antibiotics and then stop; he shouldn't need any more after that If he is still having sinus symptoms, let's have him start fluticasone nasal spray Rx In regards to the Novolog pen, I found the old medicine in the med list, but there were no instructions with it I need to know how many units of Novolog he had been using and how many times a day For example, did he give himself five units with each meal TID or did he just use it for sliding scale when sugars were above 200, etc. (?) Thank you

## 2016-01-10 ENCOUNTER — Other Ambulatory Visit: Payer: Self-pay | Admitting: Family Medicine

## 2016-01-10 LAB — VITAMIN D 25 HYDROXY (VIT D DEFICIENCY, FRACTURES): Vit D, 25-Hydroxy: 45.9 ng/mL (ref 30.0–100.0)

## 2016-01-10 LAB — C-PEPTIDE: C-Peptide: 3.5 ng/mL (ref 1.1–4.4)

## 2016-01-10 LAB — ALBUMIN: Albumin: 3.8 g/dL (ref 3.6–4.8)

## 2016-01-10 LAB — PHOSPHORUS: Phosphorus: 2.6 mg/dL (ref 2.5–4.5)

## 2016-01-10 LAB — GLUTAMIC ACID DECARBOXYLASE AUTO ABS: Glutamic Acid Decarb Ab: <5 U/mL (ref 0.0–5.0)

## 2016-01-10 LAB — PARATHYROID HORMONE, INTACT (NO CA): PTH: 59 pg/mL (ref 15–65)

## 2016-01-10 MED ORDER — INSULIN REGULAR HUMAN 100 UNIT/ML IJ SOLN: 11.0000 [IU] | Freq: Three times a day (TID) | INTRAMUSCULAR | Status: DC

## 2016-01-10 MED ORDER — INSULIN REGULAR HUMAN 100 UNIT/ML IJ SOLN: 10.0000 [IU] | Freq: Three times a day (TID) | INTRAMUSCULAR | Status: DC

## 2016-01-10 NOTE — Progress Notes (Signed)
I called pt, left message; will use a different meal-time insulin; go up just a hair from ten units with meals to eleven units with meals

## 2016-01-10 NOTE — Addendum Note (Signed)
Addended by: Makenzie Vittorio, Satira Anis on: 01/10/2016 07:05 PM   Modules accepted: Orders

## 2016-01-10 NOTE — Telephone Encounter (Signed)
I spoke with the daughter; she says yes, that he has always been on insulin He got sick one day, Dr. Rutherford Nail rushed him to the hospital; close to going into a coma; never did pills, immediately started on insulin I explained labs, they drew everything BUT the calcium that I wanted so staff is going to check into that We'll increase the short-acting insulin from ten units with each meal to eleven units with each meal

## 2016-01-12 LAB — BASIC METABOLIC PANEL
BUN/Creatinine Ratio: 16 (ref 10–24)
BUN: 16 mg/dL (ref 8–27)
CO2: 20 mmol/L (ref 18–29)
Calcium: 9.9 mg/dL (ref 8.6–10.2)
Chloride: 103 mmol/L (ref 96–106)
Creatinine, Ser: 1.03 mg/dL (ref 0.76–1.27)
GFR calc Af Amer: 86 mL/min/{1.73_m2} (ref >59)
GFR calc non Af Amer: 74 mL/min/{1.73_m2} (ref >59)
Glucose: 168 mg/dL — ABNORMAL HIGH (ref 65–99)
Potassium: 4.8 mmol/L (ref 3.5–5.2)
Sodium: 139 mmol/L (ref 134–144)

## 2016-01-12 LAB — SPECIMEN STATUS REPORT

## 2016-01-13 ENCOUNTER — Other Ambulatory Visit: Payer: Self-pay | Admitting: Family Medicine

## 2016-01-17 ENCOUNTER — Telehealth: Payer: Self-pay | Admitting: Family Medicine

## 2016-01-17 NOTE — Telephone Encounter (Signed)
Pt states he needs a call back about his insulin. Pt states he has a hard time with the pen needle going into the insulin bottle and would like to know what he can use.

## 2016-01-18 NOTE — Telephone Encounter (Signed)
Pt needing flexpen but has already been sent in

## 2016-01-21 ENCOUNTER — Encounter: Payer: Self-pay | Admitting: Family Medicine

## 2016-01-21 NOTE — Assessment & Plan Note (Signed)
Check lipids today; limit saturated fats; continue statin; monitor sgpt on statin; work on weight loss

## 2016-01-21 NOTE — Assessment & Plan Note (Signed)
Treat with augmentin; azithromycin has fallen out of favor for sinusitis due to resistance

## 2016-01-21 NOTE — Assessment & Plan Note (Signed)
Controlled today; continue meds; limit sodium

## 2016-01-21 NOTE — Assessment & Plan Note (Signed)
Quit more than 15 years ago; low dose chest CT not indicated; glad he quit smoking

## 2016-01-21 NOTE — Assessment & Plan Note (Signed)
Work on weight loss; see AVS for recommendations

## 2016-01-23 ENCOUNTER — Other Ambulatory Visit: Payer: Self-pay

## 2016-01-24 ENCOUNTER — Telehealth: Payer: Self-pay | Admitting: Family Medicine

## 2016-01-24 MED ORDER — INSULIN PEN NEEDLE 31G X 5 MM MISC: Status: DC

## 2016-01-24 MED ORDER — "INSULIN SYRINGE-NEEDLE U-100 31G X 5/16"" 0.3 ML MISC": Status: DC

## 2016-01-24 NOTE — Telephone Encounter (Signed)
[facility] that [facility] have a lot of the needles, they are needing syringes (long one) [phone]

## 2016-01-24 NOTE — Telephone Encounter (Signed)
I talked with pharmacist He should not be on two short-acting insulins; I had her cancel the most recent one that appears to have been approved by Dr. Rutherford Nail I approved insulin syringes verbally with pharmacist

## 2016-01-25 ENCOUNTER — Telehealth: Payer: Self-pay | Admitting: Emergency Medicine

## 2016-02-14 ENCOUNTER — Encounter: Payer: Self-pay | Admitting: Family Medicine

## 2016-03-14 ENCOUNTER — Other Ambulatory Visit: Payer: Self-pay | Admitting: Family Medicine

## 2016-03-14 MED ORDER — INSULIN GLARGINE 100 UNIT/ML SOLOSTAR PEN: PEN_INJECTOR | SUBCUTANEOUS | 0 refills | Status: DC

## 2016-03-14 NOTE — Telephone Encounter (Signed)
LMOM for pt to schedule an appt.

## 2016-03-14 NOTE — Telephone Encounter (Signed)
Patient needs an appt on or just after August 20th; his last A1c was not controlled, so we can't go six months in between visits; I need to see him three months after his last visit please I'll approve Rxs

## 2016-03-15 ENCOUNTER — Other Ambulatory Visit: Payer: Self-pay | Admitting: Family Medicine

## 2016-03-27 ENCOUNTER — Other Ambulatory Visit: Payer: Self-pay | Admitting: Family Medicine

## 2016-03-30 ENCOUNTER — Other Ambulatory Visit: Payer: Self-pay

## 2016-03-30 MED ORDER — LOSARTAN POTASSIUM 100 MG PO TABS: 100.0000 mg | ORAL_TABLET | Freq: Every day | ORAL | 0 refills | Status: DC

## 2016-03-30 NOTE — Telephone Encounter (Signed)
Reviewed Cr and K+ Rx approved 

## 2016-04-06 HISTORY — PX: JOINT REPLACEMENT: SHX530

## 2016-04-11 ENCOUNTER — Other Ambulatory Visit: Payer: Self-pay | Admitting: Family Medicine

## 2016-04-11 NOTE — Telephone Encounter (Signed)
Please ask him what his sugars have been running over the last week so I can further adjust insulin if needed before sending refills Thanks

## 2016-04-11 NOTE — Telephone Encounter (Signed)
Pt states not checking?

## 2016-04-11 NOTE — Telephone Encounter (Signed)
If he is using insulin, he really needs to check his finger stick blood sugar AT LEAST once a day Dr. Rutherford Nail prescribed him 100 test strips with 12 refills in March Please have him check once a day (preferably three times a day) and call us in one week with readings He is overdue for an appt with me anyway, so please ask him to schedule first available and bring his sugar readings with him to all appts

## 2016-04-11 NOTE — Telephone Encounter (Signed)
pt notified

## 2016-04-13 ENCOUNTER — Emergency Department: Payer: Medicare Other

## 2016-04-13 ENCOUNTER — Inpatient Hospital Stay
Admission: EM | Admit: 2016-04-13 | Discharge: 2016-04-19 | DRG: 480 | Disposition: A | Discharge status: Skilled Nursing Facility | Payer: Medicare Other | Attending: Internal Medicine | Admitting: Internal Medicine

## 2016-04-13 ENCOUNTER — Encounter: Payer: Self-pay | Admitting: Emergency Medicine

## 2016-04-13 DIAGNOSIS — Z452 Encounter for adjustment and management of vascular access device: Secondary | ICD-10-CM

## 2016-04-13 DIAGNOSIS — S72009A Fracture of unspecified part of neck of unspecified femur, initial encounter for closed fracture: Secondary | ICD-10-CM | POA: Diagnosis present

## 2016-04-13 DIAGNOSIS — Z7982 Long term (current) use of aspirin: Secondary | ICD-10-CM

## 2016-04-13 DIAGNOSIS — W1830XA Fall on same level, unspecified, initial encounter: Secondary | ICD-10-CM | POA: Diagnosis present

## 2016-04-13 DIAGNOSIS — M25552 Pain in left hip: Secondary | ICD-10-CM | POA: Diagnosis present

## 2016-04-13 DIAGNOSIS — R0602 Shortness of breath: Secondary | ICD-10-CM | POA: Diagnosis not present

## 2016-04-13 DIAGNOSIS — G9340 Encephalopathy, unspecified: Secondary | ICD-10-CM | POA: Diagnosis not present

## 2016-04-13 DIAGNOSIS — S72142A Displaced intertrochanteric fracture of left femur, initial encounter for closed fracture: Principal | ICD-10-CM | POA: Diagnosis present

## 2016-04-13 DIAGNOSIS — S72002A Fracture of unspecified part of neck of left femur, initial encounter for closed fracture: Secondary | ICD-10-CM | POA: Diagnosis not present

## 2016-04-13 DIAGNOSIS — Z794 Long term (current) use of insulin: Secondary | ICD-10-CM

## 2016-04-13 DIAGNOSIS — K2980 Duodenitis without bleeding: Secondary | ICD-10-CM | POA: Diagnosis present

## 2016-04-13 DIAGNOSIS — I2699 Other pulmonary embolism without acute cor pulmonale: Secondary | ICD-10-CM | POA: Diagnosis not present

## 2016-04-13 DIAGNOSIS — J9601 Acute respiratory failure with hypoxia: Secondary | ICD-10-CM | POA: Diagnosis not present

## 2016-04-13 DIAGNOSIS — R0600 Dyspnea, unspecified: Secondary | ICD-10-CM

## 2016-04-13 DIAGNOSIS — A419 Sepsis, unspecified organism: Secondary | ICD-10-CM | POA: Diagnosis not present

## 2016-04-13 DIAGNOSIS — Z87891 Personal history of nicotine dependence: Secondary | ICD-10-CM | POA: Diagnosis not present

## 2016-04-13 DIAGNOSIS — E119 Type 2 diabetes mellitus without complications: Secondary | ICD-10-CM | POA: Diagnosis present

## 2016-04-13 DIAGNOSIS — R609 Edema, unspecified: Secondary | ICD-10-CM

## 2016-04-13 DIAGNOSIS — N390 Urinary tract infection, site not specified: Secondary | ICD-10-CM | POA: Diagnosis present

## 2016-04-13 DIAGNOSIS — K5900 Constipation, unspecified: Secondary | ICD-10-CM | POA: Diagnosis not present

## 2016-04-13 DIAGNOSIS — N179 Acute kidney failure, unspecified: Secondary | ICD-10-CM | POA: Diagnosis not present

## 2016-04-13 DIAGNOSIS — E669 Obesity, unspecified: Secondary | ICD-10-CM | POA: Diagnosis present

## 2016-04-13 DIAGNOSIS — Z6839 Body mass index (BMI) 39.0-39.9, adult: Secondary | ICD-10-CM | POA: Diagnosis not present

## 2016-04-13 DIAGNOSIS — I10 Essential (primary) hypertension: Secondary | ICD-10-CM | POA: Diagnosis present

## 2016-04-13 DIAGNOSIS — Z79899 Other long term (current) drug therapy: Secondary | ICD-10-CM | POA: Diagnosis not present

## 2016-04-13 DIAGNOSIS — R112 Nausea with vomiting, unspecified: Secondary | ICD-10-CM

## 2016-04-13 DIAGNOSIS — E785 Hyperlipidemia, unspecified: Secondary | ICD-10-CM | POA: Diagnosis present

## 2016-04-13 LAB — CBC WITH DIFFERENTIAL/PLATELET
Basophils Absolute: 0 10*3/uL (ref 0–0.1)
Basophils Relative: 0 %
Eosinophils Absolute: 0.1 10*3/uL (ref 0–0.7)
Eosinophils Relative: 1 %
HCT: 39.7 % — ABNORMAL LOW (ref 40.0–52.0)
Hemoglobin: 13.5 g/dL (ref 13.0–18.0)
Lymphocytes Relative: 16 %
Lymphs Abs: 1.7 10*3/uL (ref 1.0–3.6)
MCH: 29.2 pg (ref 26.0–34.0)
MCHC: 34.1 g/dL (ref 32.0–36.0)
MCV: 85.6 fL (ref 80.0–100.0)
Monocytes Absolute: 0.7 10*3/uL (ref 0.2–1.0)
Monocytes Relative: 6 %
Neutro Abs: 8.3 10*3/uL — ABNORMAL HIGH (ref 1.4–6.5)
Neutrophils Relative %: 77 %
Platelets: 238 10*3/uL (ref 150–440)
RBC: 4.63 MIL/uL (ref 4.40–5.90)
RDW: 14.3 % (ref 11.5–14.5)
WBC: 10.8 10*3/uL — ABNORMAL HIGH (ref 3.8–10.6)

## 2016-04-13 LAB — URINALYSIS COMPLETE WITH MICROSCOPIC (ARMC ONLY)
Bacteria, UA: NONE SEEN
Bilirubin Urine: NEGATIVE
Glucose, UA: >500 mg/dL — AB
Hgb urine dipstick: NEGATIVE
Ketones, ur: NEGATIVE mg/dL
Leukocytes, UA: NEGATIVE
Nitrite: NEGATIVE
Protein, ur: 100 mg/dL — AB
Specific Gravity, Urine: 1.018 (ref 1.005–1.030)
Squamous Epithelial / LPF: NONE SEEN
pH: 5 (ref 5.0–8.0)

## 2016-04-13 LAB — TYPE AND SCREEN
ABO/RH(D): O POS
Antibody Screen: NEGATIVE

## 2016-04-13 LAB — SURGICAL PCR SCREEN
MRSA, PCR: NEGATIVE
Staphylococcus aureus: NEGATIVE

## 2016-04-13 LAB — BASIC METABOLIC PANEL
Anion gap: 6 (ref 5–15)
BUN: 13 mg/dL (ref 6–20)
CO2: 26 mmol/L (ref 22–32)
Calcium: 9.6 mg/dL (ref 8.9–10.3)
Chloride: 105 mmol/L (ref 101–111)
Creatinine, Ser: 0.9 mg/dL (ref 0.61–1.24)
GFR calc Af Amer: >60 mL/min (ref >60)
GFR calc non Af Amer: >60 mL/min (ref >60)
Glucose, Bld: 183 mg/dL — ABNORMAL HIGH (ref 65–99)
Potassium: 3.6 mmol/L (ref 3.5–5.1)
Sodium: 137 mmol/L (ref 135–145)

## 2016-04-13 LAB — GLUCOSE, CAPILLARY
Glucose-Capillary: 184 mg/dL — ABNORMAL HIGH (ref 65–99)
Glucose-Capillary: 220 mg/dL — ABNORMAL HIGH (ref 65–99)

## 2016-04-13 LAB — PROTIME-INR
INR: 0.98
Prothrombin Time: 13 seconds (ref 11.4–15.2)

## 2016-04-13 MED ORDER — HYDROCODONE-ACETAMINOPHEN 10-325 MG PO TABS
1.0000 | ORAL_TABLET | Freq: Four times a day (QID) | ORAL | Status: DC | PRN
  Administered 2016-04-13: 1 via ORAL
  Filled 2016-04-13: qty 1

## 2016-04-13 MED ORDER — ASPIRIN EC 81 MG PO TBEC
81.0000 mg | DELAYED_RELEASE_TABLET | Freq: Every day | ORAL | Status: DC
  Administered 2016-04-13 – 2016-04-17 (×4): 81 mg via ORAL
  Filled 2016-04-13 (×4): qty 1

## 2016-04-13 MED ORDER — DIPHENHYDRAMINE HCL 25 MG PO CAPS
25.0000 mg | ORAL_CAPSULE | Freq: Every evening | ORAL | Status: DC | PRN
  Administered 2016-04-14: 25 mg via ORAL
  Filled 2016-04-13: qty 1

## 2016-04-13 MED ORDER — FISH OIL + D3 1200-1000 MG-UNIT PO CAPS: 1200.0000 mg | ORAL_CAPSULE | Freq: Every day | ORAL | Status: DC

## 2016-04-13 MED ORDER — INSULIN ASPART 100 UNIT/ML ~~LOC~~ SOLN
0.0000 [IU] | Freq: Three times a day (TID) | SUBCUTANEOUS | Status: DC
  Administered 2016-04-14: 2 [IU] via SUBCUTANEOUS
  Administered 2016-04-14: 3 [IU] via SUBCUTANEOUS
  Administered 2016-04-14: 2 [IU] via SUBCUTANEOUS
  Administered 2016-04-15 (×2): 1 [IU] via SUBCUTANEOUS
  Administered 2016-04-15 – 2016-04-16 (×3): 2 [IU] via SUBCUTANEOUS
  Administered 2016-04-16: 1 [IU] via SUBCUTANEOUS
  Administered 2016-04-17: 2 [IU] via SUBCUTANEOUS
  Administered 2016-04-17 – 2016-04-18 (×3): 1 [IU] via SUBCUTANEOUS
  Administered 2016-04-19: 2 [IU] via SUBCUTANEOUS
  Filled 2016-04-13 (×2): qty 1
  Filled 2016-04-13 (×2): qty 2
  Filled 2016-04-13: qty 5
  Filled 2016-04-13 (×2): qty 1
  Filled 2016-04-13 (×6): qty 2
  Filled 2016-04-13: qty 1

## 2016-04-13 MED ORDER — INSULIN GLARGINE 100 UNIT/ML ~~LOC~~ SOLN
30.0000 [IU] | Freq: Every day | SUBCUTANEOUS | Status: DC
  Administered 2016-04-13: 30 [IU] via SUBCUTANEOUS
  Filled 2016-04-13 (×2): qty 0.3

## 2016-04-13 MED ORDER — LOSARTAN POTASSIUM 50 MG PO TABS
100.0000 mg | ORAL_TABLET | Freq: Every day | ORAL | Status: DC
  Administered 2016-04-15 – 2016-04-19 (×5): 100 mg via ORAL
  Filled 2016-04-13 (×5): qty 2

## 2016-04-13 MED ORDER — TETANUS-DIPHTH-ACELL PERTUSSIS 5-2.5-18.5 LF-MCG/0.5 IM SUSP
0.5000 mL | Freq: Once | INTRAMUSCULAR | Status: DC
  Filled 2016-04-13: qty 0.5

## 2016-04-13 MED ORDER — FENTANYL CITRATE (PF) 100 MCG/2ML IJ SOLN
50.0000 ug | Freq: Once | INTRAMUSCULAR | Status: AC
  Administered 2016-04-13: 50 ug via INTRAVENOUS
  Filled 2016-04-13: qty 2

## 2016-04-13 MED ORDER — VITAMIN D 1000 UNITS PO TABS
1000.0000 [IU] | ORAL_TABLET | Freq: Two times a day (BID) | ORAL | Status: DC
  Administered 2016-04-13 – 2016-04-17 (×7): 1000 [IU] via ORAL
  Filled 2016-04-13 (×7): qty 1

## 2016-04-13 MED ORDER — FENTANYL CITRATE (PF) 100 MCG/2ML IJ SOLN
50.0000 ug | Freq: Once | INTRAMUSCULAR | Status: DC
Start: 2016-04-13 — End: 2016-04-13

## 2016-04-13 MED ORDER — FENTANYL CITRATE (PF) 100 MCG/2ML IJ SOLN
INTRAMUSCULAR | Status: AC
  Filled 2016-04-13: qty 2

## 2016-04-13 MED ORDER — ATORVASTATIN CALCIUM 20 MG PO TABS
40.0000 mg | ORAL_TABLET | Freq: Every day | ORAL | Status: DC
  Administered 2016-04-13 – 2016-04-16 (×4): 40 mg via ORAL
  Filled 2016-04-13 (×5): qty 2

## 2016-04-13 MED ORDER — FENTANYL CITRATE (PF) 100 MCG/2ML IJ SOLN
50.0000 ug | Freq: Once | INTRAMUSCULAR | Status: AC
  Administered 2016-04-13: 50 ug via INTRAVENOUS

## 2016-04-13 MED ORDER — MORPHINE SULFATE (PF) 2 MG/ML IV SOLN
2.0000 mg | INTRAVENOUS | Status: DC | PRN
  Administered 2016-04-13 (×2): 2 mg via INTRAVENOUS
  Filled 2016-04-13 (×2): qty 1

## 2016-04-13 MED ORDER — AMLODIPINE BESYLATE 5 MG PO TABS
5.0000 mg | ORAL_TABLET | Freq: Every day | ORAL | Status: DC
  Administered 2016-04-15 – 2016-04-19 (×5): 5 mg via ORAL
  Filled 2016-04-13 (×5): qty 1

## 2016-04-13 MED ORDER — DEXTROSE 5 % IV SOLN
3.0000 g | INTRAVENOUS | Status: DC
  Filled 2016-04-13: qty 3000

## 2016-04-13 MED ORDER — OMEGA-3-ACID ETHYL ESTERS 1 G PO CAPS
1.0000 g | ORAL_CAPSULE | Freq: Every day | ORAL | Status: DC
  Administered 2016-04-13 – 2016-04-17 (×4): 1 g via ORAL
  Filled 2016-04-13 (×4): qty 1

## 2016-04-13 MED ORDER — ACETAMINOPHEN 500 MG PO TABS: 500.0000 mg | ORAL_TABLET | Freq: Four times a day (QID) | ORAL | Status: DC | PRN

## 2016-04-13 MED ORDER — MORPHINE SULFATE (PF) 4 MG/ML IV SOLN
4.0000 mg | INTRAVENOUS | Status: DC | PRN
  Administered 2016-04-13 – 2016-04-14 (×2): 4 mg via INTRAVENOUS
  Filled 2016-04-13 (×2): qty 1

## 2016-04-13 NOTE — Consult Note (Signed)
ORTHOPAEDIC CONSULTATION  REQUESTING PHYSICIAN: Vaughan Basta, MD  Chief Complaint:   Left hip pain.  History of Present Illness: Edgar [name] is a 70 y.o. male with a history of insulin-dependent diabetes, hypertension, and hyperlipidemia who was in his usual state of health this morning. Apparently he was trying to clean out a bees nest from near his mailbox when these began to attacked him. He tried to back awakened quickly but fell backwards onto his left hip. He was unable to get up on his own. With the help of several bystanders, he was able to get back into his truck where he drove home but was then unable to get out. The EMS was called. They got him out of his truck and brought him to the emergency room where x-rays demonstrated a displaced intertrochanteric fracture of his left hip. The patient does note that he bumped his head when he fell but he did not lose consciousness. The patient denies any prior problems with his left hip or lower extremity. He also denies any lightheadedness, shortness of breath, dizziness, or other symptoms that may have precipitated his fall.  Past Medical History:  Diagnosis Date  . Diabetes mellitus without complication ([place])   . Essential hypertension, benign 12/23/2015  . Hx of tobacco use, presenting hazards to health 12/23/2015   Quit prior to 1997  . Hyperlipidemia 12/23/2015  . Hypertension   . Obesity 12/23/2015   Past Surgical History:  Procedure Laterality Date  . EYE SURGERY     Social History   Social History  . Marital status: Married    Spouse name: N/A  . Number of children: N/A  . Years of education: N/A   Social History Main Topics  . Smoking status: Former Research scientist (life sciences)  . Smokeless tobacco: Never Used  . Alcohol use No  . Drug use: No  . Sexual activity: Not Asked   Other Topics Concern  . None   Social History Narrative  . None   Family History  Problem  Relation Age of Onset  . Cancer Mother   . Diabetes Mother   . Heart disease Father    No Known Allergies Prior to Admission medications   Medication Sig Start Date End Date Taking? Authorizing Provider  acetaminophen (TYLENOL) 500 MG tablet Take 500 mg by mouth every 6 (six) hours as needed.   Yes Historical Provider, MD  amLODipine (NORVASC) 5 MG tablet TAKE 1 TABLET EVERY DAY 12/22/15  Yes Ashok Norris, MD  aspirin EC 81 MG tablet Take 81 mg by mouth daily.   Yes Historical Provider, MD  atorvastatin (LIPITOR) 40 MG tablet TAKE 1 TABLET BY MOUTH AT BEDTIME 03/14/16  Yes Arnetha Courser, MD  cholecalciferol (VITAMIN D) 1000 units tablet Take 1,000 Units by mouth 2 (two) times daily.    Yes Historical Provider, MD  Fish Oil-Cholecalciferol (FISH OIL + D3) 1200-1000 MG-UNIT CAPS Take 1,200 mg by mouth daily.   Yes Historical Provider, MD  HYDROcodone-acetaminophen (NORCO) 10-325 MG tablet Take 1 tablet by mouth every 8 (eight) hours as needed. 10/18/15  Yes Ashok Norris, MD  insulin regular (HUMULIN R) 100 units/mL injection Inject 0.11 mLs (11 Units total) into the skin 3 (three) times daily before meals. This replaces NOVOLOG (this is short-acting insulin) 01/10/16  Yes Arnetha Courser, MD  LANTUS SOLOSTAR 100 UNIT/ML Solostar Pen INJECT 56 UNITS SUBCUTANEOUSLY ONCE A DAY 04/11/16  Yes Arnetha Courser, MD  losartan (COZAAR) 100 MG tablet Take 1 tablet (  100 mg total) by mouth daily. 03/30/16  Yes Arnetha Courser, MD   Dg Chest 1 View  Result Date: 04/13/2016 CLINICAL DATA:  [place] from standing position landing on the left side. EXAM: CHEST 1 VIEW COMPARISON:  None. FINDINGS: Heart size is at the upper limits of normal. The aorta shows ectasia. The pulmonary vascularity is normal. Lungs are clear. No bone abnormality seen. IMPRESSION: Ectatic aorta.  No active disease. Electronically Signed   By: Nelson Chimes M.D.   On: 04/13/2016 15:57   Ct Head Wo Contrast  Result Date: 04/13/2016 CLINICAL DATA:   Pain following fall EXAM: CT HEAD WITHOUT CONTRAST TECHNIQUE: Contiguous axial images were obtained from the base of the skull through the vertex without intravenous contrast. COMPARISON:  Brain MRI October 12, 2008 FINDINGS: Brain: There is age related volume loss. The ventricles are normal in configuration. There is no intracranial mass, hemorrhage, extra-axial fluid collection, or midline shift. There is minimal small vessel disease in the centra semiovale bilaterally. Elsewhere gray-white compartments appear normal. No acute infarct evident. Vascular: There are no hyperdense vessels appreciable. There is a small amount of calcification in the left carotid siphon. Skull: The bony calvarium appears intact. There are calcifications along the falx which appear benign. There is a small bony enostosis along the right frontal bone which appears benign. There is no edema in these areas. There is mucosal thickening in several ethmoid air cells bilaterally. There is somewhat ill-defined opacity in the superior right sphenoid sinus as well as in a posterior right ethmoid air cell. Other visualized paranasal sinuses are clear. Visualized orbits appear symmetric bilaterally. Other: Visualized mastoid air cells are clear. IMPRESSION: Minimal periventricular small vessel disease. No intracranial mass, hemorrhage, or extra-axial fluid collection. No acute infarct evident. There are areas of paranasal sinus disease. There is a rather minimal amount of calcification in the left carotid siphon. Electronically Signed   By: Lowella Grip III M.D.   On: 04/13/2016 15:18   Dg Hip Unilat W Or Wo Pelvis 2-3 Views Left  Result Date: 04/13/2016 CLINICAL DATA:  Right status post fall trying to run from bees. Left hip pain. Initial encounter. EXAM: DG HIP (WITH OR WITHOUT PELVIS) 2-3V LEFT COMPARISON:  None. FINDINGS: The patient has an acute left intertrochanteric fracture which is mildly comminuted. The femoral head is located. No  other acute abnormality is seen. Lower lumbar spondylosis noted. IMPRESSION: Acute left intertrochanteric fracture. Electronically Signed   By: Inge Rise M.D.   On: 04/13/2016 16:00    Positive ROS: All other systems have been reviewed and were otherwise negative with the exception of those mentioned in the HPI and as above.  Physical Exam: General:  Alert, no acute distress Psychiatric:  Patient is competent for consent with normal mood and affect   Cardiovascular:  No pedal edema Respiratory:  No wheezing, non-labored breathing GI:  Abdomen is soft and non-tender Skin:  No lesions in the area of chief complaint Neurologic:  Sensation intact distally Lymphatic:  No axillary or cervical lymphadenopathy  Orthopedic Exam:  Orthopedic examination is limited to the left hip and lower extremity. The left lower extremity is shortened and externally rotated as compared to the right. Skin inspection around the left hip is unremarkable, other than some swelling. The patient has moderate pain to palpation over the anterior and lateral aspects of the hip, as well as significant pain with any attempted active or passive motion of the hip. He is able to actively dorsiflex  and plantarflex his toes and ankle. Sensation is intact to light touch to all distributions. He has good capillary refill to his left foot.  X-rays:  X-rays of the pelvis and left hip are available for review. These films demonstrate a comminuted displaced intertrochanteric fracture of the left hip. Mild degenerative changes of the hip joint are noted with overall satisfactory maintenance of the clear space. No lytic lesions or other bony abnormalities are identified.  Assessment: Displaced comminuted intertrochanteric fracture left hip.  Plan: The treatment options are discussed with the patient and his wife, who is at the bedside. Nonsurgical versus surgical treatments have been discussed. The patient would like to proceed with  closed reduction and intramedullary nailing of the left hip fracture. This procedure has been described in detail, as have the potential risks (including bleeding, infection, nerve and/or blood vessel injury, persistent or recurrent pain, malunion and/or nonunion, leg length inequality, need for further surgery, blood clots, strokes, heart attacks and/or arrhythmias, etc.) and benefits. The patient states his understanding and wishes to proceed. A formal written consent will be obtained by the nursing staff.  Thank you for asking me to participate in the care of this most pleasant unfortunate man. I will be happy to follow him with you.   Pascal Lux, MD  Beeper #:  [phone]  04/13/2016 5:22 PM

## 2016-04-13 NOTE — H&P (Signed)
[facility] [place] NAME: Edgar [name]    MR#:  [id number]  DATE OF BIRTH:  [birth year]  DATE OF ADMISSION:  04/13/2016  PRIMARY CARE PHYSICIAN: Ashok Norris, MD   REQUESTING/REFERRING PHYSICIAN: Reita Cliche  CHIEF COMPLAINT:   Chief Complaint  Patient presents with  . Fall  . Hip Pain    HISTORY OF PRESENT ILLNESS: Edgar [name]  is a 70 y.o. male with a known history of Diabetes, hypertension, hyperlipidemia, obesity- is active in his day-to-day life. 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 During his day-to-day activities he denies any chest pain he felt ever. He does not feel short of breath also. While mowing the lawn today he accidentally hit the beehive and was trying to run back to work to get away from the being but he fell down and started having severe pain on his hip. In ER he was found to have fracture on his left hip and so given his admission to hospitalist team.  PAST MEDICAL HISTORY:   Past Medical History:  Diagnosis Date  . Diabetes mellitus without complication ([place])   . Essential hypertension, benign 12/23/2015  . Hx of tobacco use, presenting hazards to health 12/23/2015   Quit prior to 1997  . Hyperlipidemia 12/23/2015  . Hypertension   . Obesity 12/23/2015    PAST SURGICAL HISTORY: Past Surgical History:  Procedure Laterality Date  . EYE SURGERY      SOCIAL HISTORY:  Social History  Substance Use Topics  . Smoking status: Former Research scientist (life sciences)  . Smokeless tobacco: Never Used  . Alcohol use No    FAMILY HISTORY:  Family History  Problem Relation Age of Onset  . Cancer Mother   . Diabetes Mother   . Heart disease Father     DRUG ALLERGIES: No Known Allergies  REVIEW OF SYSTEMS:   CONSTITUTIONAL: No fever, fatigue or weakness.  EYES: No blurred or double vision.  EARS, NOSE, AND THROAT: No tinnitus or ear pain.  RESPIRATORY: No cough, shortness of  breath, wheezing or hemoptysis.  CARDIOVASCULAR: No chest pain, orthopnea, edema.  GASTROINTESTINAL: No nausea, vomiting, diarrhea or abdominal pain.  GENITOURINARY: No dysuria, hematuria.  ENDOCRINE: No polyuria, nocturia,  HEMATOLOGY: No anemia, easy bruising or bleeding SKIN: No rash or lesion. MUSCULOSKELETAL: Left hip joint pain.   NEUROLOGIC: No tingling, numbness, weakness.  PSYCHIATRY: No anxiety or depression.   MEDICATIONS AT HOME:  Prior to Admission medications   Medication Sig Start Date End Date Taking? Authorizing Provider  acetaminophen (TYLENOL) 500 MG tablet Take 500 mg by mouth every 6 (six) hours as needed.   Yes Historical Provider, MD  amLODipine (NORVASC) 5 MG tablet TAKE 1 TABLET EVERY DAY 12/22/15  Yes Ashok Norris, MD  aspirin EC 81 MG tablet Take 81 mg by mouth daily.   Yes Historical Provider, MD  atorvastatin (LIPITOR) 40 MG tablet TAKE 1 TABLET BY MOUTH AT BEDTIME 03/14/16  Yes Arnetha Courser, MD  cholecalciferol (VITAMIN D) 1000 units tablet Take 1,000 Units by mouth 2 (two) times daily.    Yes Historical Provider, MD  Fish Oil-Cholecalciferol (FISH OIL + D3) 1200-1000 MG-UNIT CAPS Take 1,200 mg by mouth daily.   Yes Historical Provider, MD  HYDROcodone-acetaminophen (NORCO) 10-325 MG tablet Take 1 tablet by mouth every 8 (eight) hours as needed. 10/18/15  Yes Ashok Norris, MD  insulin regular (HUMULIN R) 100 units/mL  injection Inject 0.11 mLs (11 Units total) into the skin 3 (three) times daily before meals. This replaces NOVOLOG (this is short-acting insulin) 01/10/16  Yes Arnetha Courser, MD  LANTUS SOLOSTAR 100 UNIT/ML Solostar Pen INJECT 56 UNITS SUBCUTANEOUSLY ONCE A DAY 04/11/16  Yes Arnetha Courser, MD  losartan (COZAAR) 100 MG tablet Take 1 tablet (100 mg total) by mouth daily. 03/30/16  Yes Arnetha Courser, MD      PHYSICAL EXAMINATION:   VITAL SIGNS: Blood pressure 132/75, pulse 75, temperature 98.1 F (36.7 C), temperature source Oral, height 6'  (1.829 m), weight 136.1 kg (300 lb), SpO2 95 %.  GENERAL:  70 y.o.-year-old patient lying in the bed with no acute distress.  EYES: Pupils equal, round, reactive to light and accommodation. No scleral icterus. Extraocular muscles intact.  HEENT: Head atraumatic, normocephalic. Oropharynx and nasopharynx clear.  NECK:  Supple, no jugular venous distention. No thyroid enlargement, no tenderness.  LUNGS: Normal breath sounds bilaterally, no wheezing, rales,rhonchi or crepitation. No use of accessory muscles of respiration.  CARDIOVASCULAR: S1, S2 normal. No murmurs, rubs, or gallops.  ABDOMEN: Soft, nontender, nondistended. Bowel sounds present. No organomegaly or mass.  EXTREMITIES: No pedal edema, cyanosis, or clubbing. Pain in the left hip, restricted movement currently. NEUROLOGIC: Cranial nerves II through XII are intact. Muscle strength 5/5 in all extremities. Sensation intact. Gait not checked.  PSYCHIATRIC: The patient is alert and oriented x 3.  SKIN: No obvious rash, lesion, or ulcer.   LABORATORY PANEL:   CBC  Recent Labs Lab 04/13/16 1621  WBC 10.8*  HGB 13.5  HCT 39.7*  PLT 238  MCV 85.6  MCH 29.2  MCHC 34.1  RDW 14.3  LYMPHSABS 1.7  MONOABS 0.7  EOSABS 0.1  BASOSABS 0.0   ------------------------------------------------------------------------------------------------------------------  Chemistries   Recent Labs Lab 04/13/16 1621  NA 137  K 3.6  CL 105  CO2 26  GLUCOSE 183*  BUN 13  CREATININE 0.90  CALCIUM 9.6   ------------------------------------------------------------------------------------------------------------------ estimated creatinine clearance is 110.7 mL/min (by C-G formula based on SCr of 0.9 mg/dL). ------------------------------------------------------------------------------------------------------------------ No results for input(s): TSH, T4TOTAL, T3FREE, THYROIDAB in the last 72 hours.  Invalid input(s): FREET3   Coagulation  profile  Recent Labs Lab 04/13/16 1621  INR 0.98   ------------------------------------------------------------------------------------------------------------------- No results for input(s): DDIMER in the last 72 hours. -------------------------------------------------------------------------------------------------------------------  Cardiac Enzymes No results for input(s): CKMB, TROPONINI, MYOGLOBIN in the last 168 hours.  Invalid input(s): CK ------------------------------------------------------------------------------------------------------------------ Invalid input(s): POCBNP  ---------------------------------------------------------------------------------------------------------------  Urinalysis    Component Value Date/Time   COLORURINE YELLOW (A) 04/13/2016 1621   APPEARANCEUR CLEAR (A) 04/13/2016 1621   LABSPEC 1.018 04/13/2016 1621   PHURINE 5.0 04/13/2016 1621   GLUCOSEU >500 (A) 04/13/2016 1621   HGBUR NEGATIVE 04/13/2016 1621   BILIRUBINUR NEGATIVE 04/13/2016 1621   KETONESUR NEGATIVE 04/13/2016 1621   PROTEINUR 100 (A) 04/13/2016 1621   NITRITE NEGATIVE 04/13/2016 1621   LEUKOCYTESUR NEGATIVE 04/13/2016 1621     RADIOLOGY: Dg Chest 1 View  Result Date: 04/13/2016 CLINICAL DATA:  [place] from standing position landing on the left side. EXAM: CHEST 1 VIEW COMPARISON:  None. FINDINGS: Heart size is at the upper limits of normal. The aorta shows ectasia. The pulmonary vascularity is normal. Lungs are clear. No bone abnormality seen. IMPRESSION: Ectatic aorta.  No active disease. Electronically Signed   By: Nelson Chimes M.D.   On: 04/13/2016 15:57   Ct Head Wo Contrast  Result Date: 04/13/2016 CLINICAL DATA:  Pain  following fall EXAM: CT HEAD WITHOUT CONTRAST TECHNIQUE: Contiguous axial images were obtained from the base of the skull through the vertex without intravenous contrast. COMPARISON:  Brain MRI October 12, 2008 FINDINGS: Brain: There is age related volume  loss. The ventricles are normal in configuration. There is no intracranial mass, hemorrhage, extra-axial fluid collection, or midline shift. There is minimal small vessel disease in the centra semiovale bilaterally. Elsewhere gray-white compartments appear normal. No acute infarct evident. Vascular: There are no hyperdense vessels appreciable. There is a small amount of calcification in the left carotid siphon. Skull: The bony calvarium appears intact. There are calcifications along the falx which appear benign. There is a small bony enostosis along the right frontal bone which appears benign. There is no edema in these areas. There is mucosal thickening in several ethmoid air cells bilaterally. There is somewhat ill-defined opacity in the superior right sphenoid sinus as well as in a posterior right ethmoid air cell. Other visualized paranasal sinuses are clear. Visualized orbits appear symmetric bilaterally. Other: Visualized mastoid air cells are clear. IMPRESSION: Minimal periventricular small vessel disease. No intracranial mass, hemorrhage, or extra-axial fluid collection. No acute infarct evident. There are areas of paranasal sinus disease. There is a rather minimal amount of calcification in the left carotid siphon. Electronically Signed   By: Lowella Grip III M.D.   On: 04/13/2016 15:18   Dg Hip Unilat W Or Wo Pelvis 2-3 Views Left  Result Date: 04/13/2016 CLINICAL DATA:  Right status post fall trying to run from bees. Left hip pain. Initial encounter. EXAM: DG HIP (WITH OR WITHOUT PELVIS) 2-3V LEFT COMPARISON:  None. FINDINGS: The patient has an acute left intertrochanteric fracture which is mildly comminuted. The femoral head is located. No other acute abnormality is seen. Lower lumbar spondylosis noted. IMPRESSION: Acute left intertrochanteric fracture. Electronically Signed   By: Inge Rise M.D.   On: 04/13/2016 16:00    EKG: Orders placed or performed during the hospital encounter of  04/13/16  . ED EKG  . ED EKG    IMPRESSION AND PLAN:  * Left hip fracture   Pain management and DVT prophylaxis as per the orthopedic surgical team.   As he has good baseline functional status without any anginal episodes, we can proceed to surgery with low cardiac risk. We will keep monitoring his diabetes and blood pressure during postoperative period.  * Diabetes   As he may be in the low for surgery I will give him a lower dose of Lantus tonight and keep him on sliding scale coverage.  * Hypertension   Continue home medication and monitor.  All the records are reviewed and case discussed with ED provider. Management plans discussed with the patient, family and they are in agreement.  CODE STATUS: Full code Code Status History    This patient does not have a recorded code status. Please follow your organizational policy for patients in this situation.       TOTAL TIME TAKING CARE OF THIS PATIENT: 50 minutes.    Vaughan Basta M.D on 04/13/2016   Between 7am to 6pm - Pager - [phone]  After 6pm go to [email or web address] - password [email or web address] [facility]  Office  [phone]  CC: Primary care physician; Ashok Norris, MD   Note: This dictation was prepared with Dragon dictation along with smaller phrase technology. Any transcriptional errors that result from this process are unintentional.

## 2016-04-13 NOTE — Progress Notes (Signed)
Pt c/o severe pain and rated it 10/10 on his left hip. PRN Morphine will not be due until 2220. On call Dr Jannifer [place] paged, ordered to give a dose now and increase the Morphine dose to 4mg  every 4hrs IV. Will administer and continue to monitor.

## 2016-04-13 NOTE — ED Triage Notes (Signed)
Pt in via EMS; pt reports mowing finishing up mowing a yard today, stepping backwards to get away from a bee's nest and falling, landing on ground on left hip.  Pt with complaints of left hip and leg pain down to the knee, shortening noted.  Pt comfortable as long as his is laying still.  Pt A/Ox4, vitals WDL, no immediate distress noted at this time.

## 2016-04-13 NOTE — ED Provider Notes (Addendum)
[facility] Emergency Department Provider Note  ____________________________________________   I have reviewed the triage vital signs and the nursing notes.   HISTORY  Chief Complaint Fall and Hip Pain    HPI Edgar [name] is a 70 y.o. male who is not on blood thinners. He was tenting to eradicate a beehive and the bees came out him. The patient, in fleeing from a possible hymenoptera envenomation, fell over backwards. He did hit his head but did not pass out. Patient sustained however an injury to his left hip is been unable to ambulate since that time. Denies any numbness or weakness. A passing car fortunately helped him out        Past Medical History:  Diagnosis Date  . Diabetes mellitus without complication ([facility])   . Essential hypertension, benign 12/23/2015  . Hx of tobacco use, presenting hazards to health 12/23/2015   Quit prior to 1997  . Hyperlipidemia 12/23/2015  . Hypertension   . Obesity 12/23/2015    Patient Active Problem List   Diagnosis Date Noted  . Hypercalcemia 01/05/2016  . Diabetes ([facility]) 01/05/2016  . Sinusitis, acute, maxillary 12/23/2015  . Essential hypertension, benign 12/23/2015  . Hyperlipidemia 12/23/2015  . Hx of tobacco use, presenting hazards to health 12/23/2015  . Obesity 12/23/2015  . Type II diabetes mellitus, uncontrolled ([facility]) 01/25/2015    Past Surgical History:  Procedure Laterality Date  . EYE SURGERY      Prior to Admission medications   Medication Sig Start Date End Date Taking? Authorizing Provider  acetaminophen (TYLENOL) 500 MG tablet Take 500 mg by mouth every 6 (six) hours as needed.    Historical Provider, MD  amLODipine (NORVASC) 5 MG tablet TAKE 1 TABLET EVERY DAY 12/22/15   Lemont Morrisey, MD  aspirin EC 81 MG tablet Take 81 mg by mouth daily.    Historical Provider, MD  atorvastatin (LIPITOR) 40 MG tablet TAKE 1 TABLET BY MOUTH AT BEDTIME 03/14/16   Melinda P Lada, MD  Blood Glucose  Monitoring Suppl (ONE TOUCH ULTRA 2) w/Device KIT 1 Device by Does not apply route 2 (two) times daily. 10/28/15   Lemont Morrisey, MD  cholecalciferol (VITAMIN D) 1000 units tablet Take 1,000 Units by mouth daily.    Historical Provider, MD  Fish Oil-Cholecalciferol (FISH OIL + D3) 1200-1000 MG-UNIT CAPS Take 1,200 mg by mouth daily.    Historical Provider, MD  fluticasone (FLONASE) 50 MCG/ACT nasal spray Place 2 sprays into both nostrils daily. 01/06/16   Melinda P Lada, MD  glucose blood (ONE TOUCH ULTRA TEST) test strip Use as instructed 10/27/15   Lemont Morrisey, MD  HYDROcodone-acetaminophen (NORCO) 10-325 MG tablet Take 1 tablet by mouth every 8 (eight) hours as needed. 10/18/15   Lemont Morrisey, MD  Insulin Pen Needle (B-D UF III MINI PEN NEEDLES) 31G X 5 MM MISC USE AS DIRECTED 4 TIMES DAILY 01/24/16   Melinda P Lada, MD  insulin regular (HUMULIN R) 100 units/mL injection Inject 0.11 mLs (11 Units total) into the skin 3 (three) times daily before meals. This replaces NOVOLOG (this is short-acting insulin) 01/10/16   Melinda P Lada, MD  Insulin Syringe-Needle U-100 (B-D INS SYR HALF-UNIT .3CC/31G) 31G X 5/16" 0.3 ML MISC Use with insulin four times a day 01/24/16   Melinda P Lada, MD  LANTUS SOLOSTAR 100 UNIT/ML Solostar Pen INJECT 56 UNITS SUBCUTANEOUSLY ONCE A DAY 04/11/16   Melinda P Lada, MD  losartan (COZAAR) 100 MG tablet Take 1 tablet (  100 mg total) by mouth daily. 03/30/16   Arnetha Courser, MD  meloxicam (MOBIC) 15 MG tablet TAKE 1 TABLET (15 MG TOTAL) BY MOUTH DAILY. 08/10/15   Ashok Norris, MD    Allergies Review of patient's allergies indicates no known allergies.  Family History  Problem Relation Age of Onset  . Cancer Mother   . Diabetes Mother   . Heart disease Father     Social History Social History  Substance Use Topics  . Smoking status: Former Research scientist (life sciences)  . Smokeless tobacco: Never Used  . Alcohol use No    Review of Systems Constitutional: No fever/chills Eyes: No  visual changes. ENT: No sore throat. No stiff neck no neck pain Cardiovascular: Denies chest pain. Respiratory: Denies shortness of breath. Gastrointestinal:   no vomiting.  No diarrhea.  No constipation. Genitourinary: Negative for dysuria. Musculoskeletal: Negative lower extremity swelling Skin: Negative for rash. Neurological: Negative for severe headaches, focal weakness or numbness. 10-point ROS otherwise negative.  ____________________________________________   PHYSICAL EXAM:  VITAL SIGNS: ED Triage Vitals  Enc Vitals Group     BP 04/13/16 1411 132/75     Pulse Rate 04/13/16 1411 75     Resp --      Temp 04/13/16 1411 98.1 F (36.7 C)     Temp Source 04/13/16 1411 Oral     SpO2 04/13/16 1411 95 %     Weight 04/13/16 1413 300 lb (136.1 kg)     Height 04/13/16 1413 6' (1.829 m)     Head Circumference --      Peak Flow --      Pain Score 04/13/16 1413 10     Pain Loc --      Pain Edu? --      Excl. in [place]? --     Constitutional: Alert and oriented. Well appearing and in no acute distress. Eyes: Conjunctivae are normal. PERRL. EOMI. Head: Atraumatic. Nose: No congestion/rhinnorhea. Mouth/Throat: Mucous membranes are moist.  Oropharynx non-erythematous. Neck: No stridor.   Nontender with no meningismus Cardiovascular: Normal rate, regular rhythm. Grossly normal heart sounds.  Good peripheral circulation. Respiratory: Normal respiratory effort.  No retractions. Lungs CTAB. Abdominal: Soft and nontender. No distention. No guarding no rebound Back:  There is no focal tenderness or step off.  there is no midline tenderness there are no lesions noted. there is no CVA tenderness Musculoskeletal: Since hip is held in mild flexion and abduction. The hip is tender to palpation with limited range of motion. He has strong distal pulses no knee pain or ankle pain, compartment are soft, no upper extremity tenderness. No joint effusions, no DVT signs strong distal pulses no  edema Neurologic:  Normal speech and language. No gross focal neurologic deficits are appreciated.  Skin:  Skin is warm, dry and intact. No rash noted. Psychiatric: Mood and affect are normal. Speech and behavior are normal.  ____________________________________________   LABS (all labs ordered are listed, but only abnormal results are displayed)  Labs Reviewed  CBC WITH DIFFERENTIAL/PLATELET  BASIC METABOLIC PANEL  PROTIME-INR  URINALYSIS COMPLETEWITH MICROSCOPIC ([place])   ____________________________________________  EKG  I personally interpreted any EKGs ordered by me or triage Late entry ----------------------------------------- 10:59 PM on 04/26/2016 -----------------------------------------  Noble sinus rhythm at 71 beats per an acute ST elevation or depression, LAD noted, no acute ischemic changes.  ____________________________________________  ZOXWRUEAV  I reviewed any imaging ordered by me or triage that were performed during my shift and, if possible, patient and/or  family made aware of any abnormal findings. ____________________________________________   PROCEDURES  Procedure(s) performed: None  Procedures  Critical Care performed: None  ____________________________________________   INITIAL IMPRESSION / ASSESSMENT AND PLAN / ED COURSE  Pertinent labs & imaging results that were available during my care of the patient were reviewed by me and considered in my medical decision making (see chart for details).  Patient fell in a non-syncopal event being chased diabetes. Concern for hip fracture. We have given him pain medication. Because he is nearly 21, has a distracting injury, I will obtain a CT scan of his head although at this time that does not seem to be any indication for a fall from standing to obtain a CT of the cervical spine. We will initiate preoperative workup as I am suspicious that the patient has requisite. There is no evidence of acute  vascular compromise at this time.    ----------------------------------------- 3:52 PM on 04/13/2016 ----------------------------------------- My read of his xray shows an intertroch hip fx.  Blood work pending. Paging ortho &  will admit to hospitalist.  Signed out to dr. Reita Cliche at the end of my shift.    Clinical Course   ____________________________________________   FINAL CLINICAL IMPRESSION(S) / ED DIAGNOSES  Final diagnoses:  None      This chart was dictated using voice recognition software.  Despite best efforts to proofread,  errors can occur which can change meaning.      Schuyler Amor, MD 04/13/16 [place], MD 04/13/16 [place], MD 04/26/16 2259

## 2016-04-13 NOTE — ED Provider Notes (Signed)
[facility]  I accepted care from Dr. Burlene Arnt ____________________________________________    LABS (pertinent positives/negatives)  Labs Reviewed  CBC WITH DIFFERENTIAL/PLATELET - Abnormal; Notable for the following:       Result Value   WBC 10.8 (*)    HCT 39.7 (*)    Neutro Abs 8.3 (*)    All other components within normal limits  BASIC METABOLIC PANEL - Abnormal; Notable for the following:    Glucose, Bld 183 (*)    All other components within normal limits  URINALYSIS COMPLETEWITH MICROSCOPIC ([facility] ONLY) - Abnormal; Notable for the following:    Color, Urine YELLOW (*)    APPearance CLEAR (*)    Glucose, UA >500 (*)    Protein, ur 100 (*)    All other components within normal limits  PROTIME-INR  TYPE AND SCREEN     ____________________________________________    RADIOLOGY All xrays were viewed by me. Imaging interpreted by radiologist.  Left hip:  Left intertroch fracture [id number] aorta.  No active disease  ____________________________________________   PROCEDURES  Procedure(s) performed: None  Critical Care performed: None  ____________________________________________   INITIAL IMPRESSION / ASSESSMENT AND PLAN / ED COURSE   Pertinent labs & imaging results that were available during my care of the patient were reviewed by me and considered in my medical decision making (see chart for details).  This patient is awaiting consultations and laboratory studies, found to have hip fracture after a fall.  I spoke with the patient, he does take baby aspirin daily, no other blood thinners. He last ate around 9 AM.  I consulted Dr.Poggi (ortho) who will see the patient and plan for surgical management, possibly tonight.  Hospitalist was consulted for admission.  CONSULTATIONS: Orthopedics and hospitalist    Patient / Family / Caregiver informed of clinical course, medical decision-making process, and agree with  plan.      ____________________________________________   FINAL CLINICAL IMPRESSION(S) / ED DIAGNOSES  Final diagnoses:  Hip fracture, left, closed, initial encounter ([place])        Lisa Roca, MD 04/13/16 1659

## 2016-04-14 ENCOUNTER — Encounter
Admission: EM | Disposition: A | Discharge status: Skilled Nursing Facility | Payer: Self-pay | Source: Home / Self Care | Attending: Internal Medicine

## 2016-04-14 ENCOUNTER — Inpatient Hospital Stay: Payer: Medicare Other | Admitting: Anesthesiology

## 2016-04-14 ENCOUNTER — Inpatient Hospital Stay: Payer: Medicare Other

## 2016-04-14 HISTORY — PX: INTRAMEDULLARY (IM) NAIL INTERTROCHANTERIC: SHX5875

## 2016-04-14 LAB — CBC
HCT: 35.9 % — ABNORMAL LOW (ref 40.0–52.0)
Hemoglobin: 12.4 g/dL — ABNORMAL LOW (ref 13.0–18.0)
MCH: 29.3 pg (ref 26.0–34.0)
MCHC: 34.5 g/dL (ref 32.0–36.0)
MCV: 85.2 fL (ref 80.0–100.0)
Platelets: 219 10*3/uL (ref 150–440)
RBC: 4.22 MIL/uL — ABNORMAL LOW (ref 4.40–5.90)
RDW: 14 % (ref 11.5–14.5)
WBC: 10.5 10*3/uL (ref 3.8–10.6)

## 2016-04-14 LAB — BASIC METABOLIC PANEL
Anion gap: 7 (ref 5–15)
BUN: 12 mg/dL (ref 6–20)
CO2: 27 mmol/L (ref 22–32)
Calcium: 9.2 mg/dL (ref 8.9–10.3)
Chloride: 102 mmol/L (ref 101–111)
Creatinine, Ser: 0.85 mg/dL (ref 0.61–1.24)
GFR calc Af Amer: >60 mL/min (ref >60)
GFR calc non Af Amer: >60 mL/min (ref >60)
Glucose, Bld: 163 mg/dL — ABNORMAL HIGH (ref 65–99)
Potassium: 3.4 mmol/L — ABNORMAL LOW (ref 3.5–5.1)
Sodium: 136 mmol/L (ref 135–145)

## 2016-04-14 LAB — GLUCOSE, CAPILLARY
Glucose-Capillary: 190 mg/dL — ABNORMAL HIGH (ref 65–99)
Glucose-Capillary: 203 mg/dL — ABNORMAL HIGH (ref 65–99)
Glucose-Capillary: 210 mg/dL — ABNORMAL HIGH (ref 65–99)
Glucose-Capillary: 192 mg/dL — ABNORMAL HIGH (ref 65–99)
Glucose-Capillary: 171 mg/dL — ABNORMAL HIGH (ref 65–99)

## 2016-04-14 SURGERY — FIXATION, FRACTURE, INTERTROCHANTERIC, WITH INTRAMEDULLARY ROD
Anesthesia: General | Laterality: Left

## 2016-04-14 MED ORDER — FENTANYL CITRATE (PF) 100 MCG/2ML IJ SOLN
INTRAMUSCULAR | Status: DC | PRN
  Administered 2016-04-14 (×3): 25 ug via INTRAVENOUS
  Administered 2016-04-14: 50 ug via INTRAVENOUS
  Administered 2016-04-14: 25 ug via INTRAVENOUS

## 2016-04-14 MED ORDER — ONDANSETRON HCL 4 MG/2ML IJ SOLN
4.0000 mg | Freq: Four times a day (QID) | INTRAMUSCULAR | Status: DC | PRN
  Administered 2016-04-14 – 2016-04-16 (×4): 4 mg via INTRAVENOUS
  Filled 2016-04-14 (×4): qty 2

## 2016-04-14 MED ORDER — CEFAZOLIN SODIUM-DEXTROSE 2-3 GM-% IV SOLR
INTRAVENOUS | Status: DC | PRN
  Administered 2016-04-14: 3 g via INTRAVENOUS

## 2016-04-14 MED ORDER — KETOROLAC TROMETHAMINE 30 MG/ML IJ SOLN
30.0000 mg | Freq: Once | INTRAMUSCULAR | Status: AC
Start: 2016-04-14 — End: 2016-04-14
  Administered 2016-04-14: 30 mg via INTRAVENOUS

## 2016-04-14 MED ORDER — METOCLOPRAMIDE HCL 10 MG PO TABS
5.0000 mg | ORAL_TABLET | Freq: Three times a day (TID) | ORAL | Status: DC | PRN
  Filled 2016-04-14: qty 1

## 2016-04-14 MED ORDER — PROPOFOL 500 MG/50ML IV EMUL
INTRAVENOUS | Status: DC | PRN
  Administered 2016-04-14 (×2): via INTRAVENOUS
  Administered 2016-04-14: 100 ug/kg/min via INTRAVENOUS
  Administered 2016-04-14: 75 ug/kg/min via INTRAVENOUS

## 2016-04-14 MED ORDER — PHENYLEPHRINE 40 MCG/ML (10ML) SYRINGE FOR IV PUSH (FOR BLOOD PRESSURE SUPPORT)
PREFILLED_SYRINGE | INTRAVENOUS | Status: DC | PRN
  Administered 2016-04-14 (×2): 300 ug via INTRAVENOUS
  Administered 2016-04-14: 400 ug via INTRAVENOUS
  Administered 2016-04-14: 300 ug via INTRAVENOUS

## 2016-04-14 MED ORDER — ONDANSETRON HCL 4 MG/2ML IJ SOLN: 4.0000 mg | Freq: Once | INTRAMUSCULAR | Status: DC | PRN

## 2016-04-14 MED ORDER — POTASSIUM CHLORIDE IN NACL 20-0.9 MEQ/L-% IV SOLN
INTRAVENOUS | Status: DC
  Administered 2016-04-14 – 2016-04-15 (×2): via INTRAVENOUS
  Filled 2016-04-14 (×5): qty 1000

## 2016-04-14 MED ORDER — SODIUM CHLORIDE 0.9 % IV SOLN
INTRAVENOUS | Status: DC | PRN
  Administered 2016-04-14 (×2): via INTRAVENOUS

## 2016-04-14 MED ORDER — KETAMINE HCL 10 MG/ML IJ SOLN
INTRAMUSCULAR | Status: DC | PRN
  Administered 2016-04-14: 40 mg via INTRAVENOUS
  Administered 2016-04-14: 30 mg via INTRAVENOUS

## 2016-04-14 MED ORDER — BUPIVACAINE-EPINEPHRINE (PF) 0.5% -1:200000 IJ SOLN
INTRAMUSCULAR | Status: DC | PRN
  Administered 2016-04-14: 30 mL via PERINEURAL

## 2016-04-14 MED ORDER — KETOROLAC TROMETHAMINE 15 MG/ML IJ SOLN
15.0000 mg | Freq: Four times a day (QID) | INTRAMUSCULAR | Status: AC
Start: 2016-04-14 — End: 2016-04-15
  Administered 2016-04-14 – 2016-04-15 (×3): 15 mg via INTRAVENOUS
  Filled 2016-04-14 (×4): qty 1

## 2016-04-14 MED ORDER — FENTANYL CITRATE (PF) 100 MCG/2ML IJ SOLN
25.0000 ug | INTRAMUSCULAR | Status: DC | PRN
  Administered 2016-04-14: 25 ug via INTRAVENOUS

## 2016-04-14 MED ORDER — DIPHENHYDRAMINE HCL 12.5 MG/5ML PO ELIX
12.5000 mg | ORAL_SOLUTION | ORAL | Status: DC | PRN
Start: 2016-04-14 — End: 2016-04-19

## 2016-04-14 MED ORDER — NEOMYCIN-POLYMYXIN B GU 40-200000 IR SOLN
Status: AC
  Filled 2016-04-14: qty 2

## 2016-04-14 MED ORDER — OXYCODONE HCL 5 MG PO TABS
5.0000 mg | ORAL_TABLET | ORAL | Status: DC | PRN
  Administered 2016-04-15: 5 mg via ORAL
  Administered 2016-04-16: 10 mg via ORAL
  Administered 2016-04-16 – 2016-04-19 (×5): 5 mg via ORAL
  Filled 2016-04-14 (×2): qty 1
  Filled 2016-04-14: qty 2
  Filled 2016-04-14 (×5): qty 1

## 2016-04-14 MED ORDER — PANTOPRAZOLE SODIUM 40 MG PO TBEC
40.0000 mg | DELAYED_RELEASE_TABLET | Freq: Two times a day (BID) | ORAL | Status: DC
  Administered 2016-04-14 – 2016-04-17 (×7): 40 mg via ORAL
  Filled 2016-04-14 (×7): qty 1

## 2016-04-14 MED ORDER — ONDANSETRON HCL 4 MG/2ML IJ SOLN
INTRAMUSCULAR | Status: DC | PRN
  Administered 2016-04-14: 4 mg via INTRAVENOUS

## 2016-04-14 MED ORDER — BUPIVACAINE-EPINEPHRINE (PF) 0.5% -1:200000 IJ SOLN
INTRAMUSCULAR | Status: AC
  Filled 2016-04-14: qty 30

## 2016-04-14 MED ORDER — DOCUSATE SODIUM 100 MG PO CAPS
100.0000 mg | ORAL_CAPSULE | Freq: Two times a day (BID) | ORAL | Status: DC
  Administered 2016-04-14 – 2016-04-19 (×10): 100 mg via ORAL
  Filled 2016-04-14 (×10): qty 1

## 2016-04-14 MED ORDER — MAGNESIUM HYDROXIDE 400 MG/5ML PO SUSP
30.0000 mL | Freq: Every day | ORAL | Status: DC | PRN
  Administered 2016-04-16: 30 mL via ORAL
  Filled 2016-04-14: qty 30

## 2016-04-14 MED ORDER — INSULIN GLARGINE 100 UNIT/ML ~~LOC~~ SOLN
56.0000 [IU] | Freq: Every day | SUBCUTANEOUS | Status: DC
  Administered 2016-04-14 – 2016-04-18 (×5): 56 [IU] via SUBCUTANEOUS
  Filled 2016-04-14 (×8): qty 0.56

## 2016-04-14 MED ORDER — ACETAMINOPHEN 500 MG PO TABS
1000.0000 mg | ORAL_TABLET | Freq: Four times a day (QID) | ORAL | Status: AC
  Administered 2016-04-14 – 2016-04-15 (×4): 1000 mg via ORAL
  Filled 2016-04-14 (×4): qty 2

## 2016-04-14 MED ORDER — NEOMYCIN-POLYMYXIN B GU 40-200000 IR SOLN
Status: DC | PRN
  Administered 2016-04-14: 2 mL

## 2016-04-14 MED ORDER — KETOROLAC TROMETHAMINE 30 MG/ML IJ SOLN
INTRAMUSCULAR | Status: AC
  Filled 2016-04-14: qty 1

## 2016-04-14 MED ORDER — DEXTROSE 5 % IV SOLN
3.0000 g | Freq: Four times a day (QID) | INTRAVENOUS | Status: AC
  Administered 2016-04-14 – 2016-04-15 (×3): 3 g via INTRAVENOUS
  Filled 2016-04-14 (×3): qty 3000

## 2016-04-14 MED ORDER — ACETAMINOPHEN 325 MG PO TABS: 650.0000 mg | ORAL_TABLET | Freq: Four times a day (QID) | ORAL | Status: DC | PRN

## 2016-04-14 MED ORDER — HYDROMORPHONE HCL 1 MG/ML IJ SOLN
0.5000 mg | Freq: Once | INTRAMUSCULAR | Status: AC
  Administered 2016-04-14: 0.5 mg via INTRAVENOUS
  Filled 2016-04-14: qty 1

## 2016-04-14 MED ORDER — BISACODYL 10 MG RE SUPP: 10.0000 mg | Freq: Every day | RECTAL | Status: DC | PRN

## 2016-04-14 MED ORDER — PROPOFOL 10 MG/ML IV BOLUS
INTRAVENOUS | Status: DC | PRN
  Administered 2016-04-14: 50 mg via INTRAVENOUS

## 2016-04-14 MED ORDER — METOCLOPRAMIDE HCL 5 MG/ML IJ SOLN
5.0000 mg | Freq: Three times a day (TID) | INTRAMUSCULAR | Status: DC | PRN
  Administered 2016-04-15 (×2): 5 mg via INTRAVENOUS
  Administered 2016-04-16: 10 mg via INTRAVENOUS
  Filled 2016-04-14 (×3): qty 2

## 2016-04-14 MED ORDER — ONDANSETRON HCL 4 MG PO TABS
4.0000 mg | ORAL_TABLET | Freq: Four times a day (QID) | ORAL | Status: DC | PRN
  Administered 2016-04-15: 4 mg via ORAL
  Filled 2016-04-14: qty 1

## 2016-04-14 MED ORDER — ENOXAPARIN SODIUM 40 MG/0.4ML ~~LOC~~ SOLN
40.0000 mg | Freq: Two times a day (BID) | SUBCUTANEOUS | Status: DC
Start: 2016-04-14 — End: 2016-04-15
  Administered 2016-04-15: 40 mg via SUBCUTANEOUS
  Filled 2016-04-14 (×2): qty 0.4

## 2016-04-14 MED ORDER — FENTANYL CITRATE (PF) 100 MCG/2ML IJ SOLN
INTRAMUSCULAR | Status: AC
  Filled 2016-04-14: qty 2

## 2016-04-14 MED ORDER — HYDROMORPHONE HCL 1 MG/ML IJ SOLN: 1.0000 mg | INTRAMUSCULAR | Status: DC | PRN

## 2016-04-14 MED ORDER — FLEET ENEMA 7-19 GM/118ML RE ENEM: 1.0000 | ENEMA | Freq: Once | RECTAL | Status: DC | PRN

## 2016-04-14 MED ORDER — MIDAZOLAM HCL 2 MG/2ML IJ SOLN
INTRAMUSCULAR | Status: DC | PRN
  Administered 2016-04-14: 3 mg via INTRAVENOUS
  Administered 2016-04-14: 2 mg via INTRAVENOUS

## 2016-04-14 MED ORDER — ACETAMINOPHEN 650 MG RE SUPP: 650.0000 mg | Freq: Four times a day (QID) | RECTAL | Status: DC | PRN

## 2016-04-14 SURGICAL SUPPLY — 39 items
BIT DRILL 4.3MMS DISTAL GRDTED (BIT) ×1 IMPLANT
BNDG COHESIVE 4X5 TAN STRL (GAUZE/BANDAGES/DRESSINGS) ×2 IMPLANT
BNDG COHESIVE 6X5 TAN STRL LF (GAUZE/BANDAGES/DRESSINGS) ×2 IMPLANT
CANISTER SUCT 1200ML W/VALVE (MISCELLANEOUS) ×2 IMPLANT
CHLORAPREP W/TINT 26ML (MISCELLANEOUS) ×2 IMPLANT
CORTICAL BONE SCR 5.0MM X 46MM (Screw) ×2 IMPLANT
DRAPE C-ARMOR (DRAPES) IMPLANT
DRAPE SHEET LG 3/4 BI-LAMINATE (DRAPES) ×2 IMPLANT
DRILL 4.3MMS DISTAL GRADUATED (BIT) ×2
DRSG OPSITE POSTOP 3X4 (GAUZE/BANDAGES/DRESSINGS) ×2 IMPLANT
DRSG OPSITE POSTOP 4X6 (GAUZE/BANDAGES/DRESSINGS) ×6 IMPLANT
ELECT CAUTERY BLADE 6.4 (BLADE) ×2 IMPLANT
ELECT REM PT RETURN 9FT ADLT (ELECTROSURGICAL) ×2
ELECTRODE REM PT RTRN 9FT ADLT (ELECTROSURGICAL) ×1 IMPLANT
GAUZE SPONGE 4X4 12PLY STRL (GAUZE/BANDAGES/DRESSINGS) IMPLANT
GLOVE BIO SURGEON STRL SZ8 (GLOVE) ×8 IMPLANT
GLOVE INDICATOR 8.0 STRL GRN (GLOVE) ×2 IMPLANT
GOWN STRL REUS W/ TWL LRG LVL3 (GOWN DISPOSABLE) ×1 IMPLANT
GOWN STRL REUS W/ TWL XL LVL3 (GOWN DISPOSABLE) ×1 IMPLANT
GOWN STRL REUS W/TWL LRG LVL3 (GOWN DISPOSABLE) ×1
GOWN STRL REUS W/TWL XL LVL3 (GOWN DISPOSABLE) ×1
GUIDEPIN VERSANAIL DSP 3.2X444 ×2 IMPLANT
GUIDEWIRE BALL NOSE 100CM (WIRE) ×2 IMPLANT
HFN LAG SCREW 10.5MM X 115MM (Orthopedic Implant) ×2 IMPLANT
MAT BLUE FLOOR 46X72 FLO (MISCELLANEOUS) ×2 IMPLANT
NAIL HIP FRACT LT 130D 11X400 (Nail) ×2 IMPLANT
NEEDLE FILTER BLUNT 18X 1/2SAF (NEEDLE) ×1
NEEDLE FILTER BLUNT 18X1 1/2 (NEEDLE) ×1 IMPLANT
NEEDLE HYPO 22GX1.5 SAFETY (NEEDLE) ×2 IMPLANT
NS IRRIG 500ML POUR BTL (IV SOLUTION) ×2 IMPLANT
PACK HIP COMPR (MISCELLANEOUS) ×2 IMPLANT
SCREW CORTICL BON 5.0MM X 46MM (Screw) ×1 IMPLANT
STAPLER SKIN PROX 35W (STAPLE) ×2 IMPLANT
STRAP SAFETY BODY (MISCELLANEOUS) ×2 IMPLANT
SUT VIC AB 1 CT1 36 (SUTURE) ×2 IMPLANT
SUT VIC AB 2-0 CT1 (SUTURE) ×4 IMPLANT
SYR 30ML LL (SYRINGE) ×2 IMPLANT
SYRINGE 10CC LL (SYRINGE) ×2 IMPLANT
TAPE MICROFOAM 4IN (TAPE) IMPLANT

## 2016-04-14 NOTE — Progress Notes (Signed)
Pt. C/o extreme pain in fractured hip after receiving morphine. Dr. Marcille Blanco ordered dilaudid 0.5mg  x1

## 2016-04-14 NOTE — NC FL2 (Signed)
[place] LEVEL OF CARE SCREENING TOOL     IDENTIFICATION  Patient Name: Edgar [name] Birthdate: [birth year] Sex: male Admission Date (Current Location): 04/13/2016  [place] and [place] Number:  Engineering geologist and Address:  [facility], [address] [place], [place] [address]      Provider Number:    Attending Physician Name and Address:  Dustin Flock, MD  Relative Name and Phone Number:       Current Level of Care: Hospital Recommended Level of Care: [place] Prior Approval Number:    Date Approved/Denied: 04/14/16 PASRR Number: [id number]  Discharge Plan: SNF    Current Diagnoses: Patient Active Problem List   Diagnosis Date Noted  . Closed left hip fracture ([place]) 04/13/2016  . Hip fracture ([place]) 04/13/2016  . Hypercalcemia 01/05/2016  . Diabetes ([place]) 01/05/2016  . Sinusitis, acute, maxillary 12/23/2015  . Essential hypertension, benign 12/23/2015  . Hyperlipidemia 12/23/2015  . Hx of tobacco use, presenting hazards to health 12/23/2015  . Obesity 12/23/2015  . Type II diabetes mellitus, uncontrolled ([place]) 01/25/2015    Orientation RESPIRATION BLADDER Height & Weight     Self, Time, Situation, Place  O2 (2L) Continent Weight: 300 lb (136.1 kg) Height:  6' (182.9 cm)  BEHAVIORAL SYMPTOMS/MOOD NEUROLOGICAL BOWEL NUTRITION STATUS      Continent    AMBULATORY STATUS COMMUNICATION OF NEEDS Skin   Extensive Assist Verbally Surgical wounds                       Personal Care Assistance Level of Assistance  Bathing, Dressing, Total care Bathing Assistance: Limited assistance   Dressing Assistance: Limited assistance Total Care Assistance: Maximum assistance   Functional Limitations Info             SPECIAL CARE FACTORS FREQUENCY                       Contractures Contractures Info: Present    Additional Factors Info                  Current Medications  (04/14/2016):  This is the current hospital active medication list Current Facility-Administered Medications  Medication Dose Route Frequency Provider Last Rate Last Dose  . 0.9 % NaCl with KCl 20 mEq/ L  infusion   Intravenous Continuous Corky Mull, MD 100 mL/hr at 04/14/16 1231    . acetaminophen (TYLENOL) tablet 650 mg  650 mg Oral Q6H PRN Corky Mull, MD       Or  . acetaminophen (TYLENOL) suppository 650 mg  650 mg Rectal Q6H PRN Corky Mull, MD      . acetaminophen (TYLENOL) tablet 1,000 mg  1,000 mg Oral Q6H Corky Mull, MD   1,000 mg at 04/14/16 1408  . amLODipine (NORVASC) tablet 5 mg  5 mg Oral Daily Vaughan Basta, MD      . aspirin EC tablet 81 mg  81 mg Oral Daily Vaughan Basta, MD   81 mg at 04/13/16 2118  . atorvastatin (LIPITOR) tablet 40 mg  40 mg Oral QHS Vaughan Basta, MD   40 mg at 04/13/16 2118  . bisacodyl (DULCOLAX) suppository 10 mg  10 mg Rectal Daily PRN Corky Mull, MD      . ceFAZolin (ANCEF) 3 g in dextrose 5 % 50 mL IVPB  3 g Intravenous Q6H Corky Mull, MD   3 g at 04/14/16  1408  . cholecalciferol (VITAMIN D) tablet 1,000 Units  1,000 Units Oral BID Vaughan Basta, MD   1,000 Units at 04/13/16 2118  . diphenhydrAMINE (BENADRYL) 12.5 MG/5ML elixir 12.5-25 mg  12.5-25 mg Oral Q4H PRN Corky Mull, MD      . diphenhydrAMINE (BENADRYL) capsule 25 mg  25 mg Oral QHS PRN Lance Coon, MD   25 mg at 04/14/16 0005  . docusate sodium (COLACE) capsule 100 mg  100 mg Oral BID Corky Mull, MD      . enoxaparin (LOVENOX) injection 40 mg  40 mg Subcutaneous BID Corky Mull, MD      . fentaNYL (SUBLIMAZE) 100 MCG/2ML injection           . HYDROmorphone (DILAUDID) injection 1-2 mg  1-2 mg Intravenous Q2H PRN Corky Mull, MD      . insulin aspart (novoLOG) injection 0-9 Units  0-9 Units Subcutaneous TID WC Vaughan Basta, MD   3 Units at 04/14/16 1157  . insulin glargine (LANTUS) injection 56 Units  56 Units Subcutaneous Q2200 Dustin Flock, MD      . ketorolac (TORADOL) 15 MG/ML injection 15 mg  15 mg Intravenous Q6H Corky Mull, MD   15 mg at 04/14/16 1408  . ketorolac (TORADOL) 30 MG/ML injection           . losartan (COZAAR) tablet 100 mg  100 mg Oral Daily Vaughan Basta, MD      . magnesium hydroxide (MILK OF MAGNESIA) suspension 30 mL  30 mL Oral Daily PRN Corky Mull, MD      . metoCLOPramide (REGLAN) tablet 5-10 mg  5-10 mg Oral Q8H PRN Corky Mull, MD       Or  . metoCLOPramide (REGLAN) injection 5-10 mg  5-10 mg Intravenous Q8H PRN Corky Mull, MD      . omega-3 acid ethyl esters (LOVAZA) capsule 1 g  1 g Oral Daily Vaughan Basta, MD   1 g at 04/13/16 2118  . ondansetron (ZOFRAN) tablet 4 mg  4 mg Oral Q6H PRN Corky Mull, MD       Or  . ondansetron [facility]) injection 4 mg  4 mg Intravenous Q6H PRN Corky Mull, MD      . oxyCODONE (Oxy IR/ROXICODONE) immediate release tablet 5-10 mg  5-10 mg Oral Q3H PRN Corky Mull, MD      . pantoprazole (PROTONIX) EC tablet 40 mg  40 mg Oral BID Corky Mull, MD      . sodium phosphate (FLEET) 7-19 GM/118ML enema 1 enema  1 enema Rectal Once PRN Corky Mull, MD         Discharge Medications: Please see discharge summary for a list of discharge medications.  Relevant Imaging Results:  Relevant Lab Results:   Additional Information  SS [id number]  Zettie Pho, LCSW

## 2016-04-14 NOTE — Transfer of Care (Signed)
Immediate Anesthesia Transfer of Care Note  Patient: Edgar [name]  Procedure(s) Performed: Procedure(s): INTRAMEDULLARY (IM) NAIL INTERTROCHANTRIC (Left)  Patient Location: PACU  Anesthesia Type:General  Level of Consciousness: sedated  Airway & Oxygen Therapy: Patient Spontanous Breathing and Patient connected to face mask oxygen  Post-op Assessment: Report given to RN and Post -op Vital signs reviewed and stable  Post vital signs: Reviewed and stable  Last Vitals:  Vitals:   04/14/16 0429 04/14/16 0735  BP: (!) 142/84 (!) 146/86  Pulse: 83 81  Resp: 20 18  Temp: 36.8 C 36.9 C    Last Pain:  Vitals:   04/14/16 0735  TempSrc: Oral  PainSc:          Complications: No apparent anesthesia complications

## 2016-04-14 NOTE — Anesthesia Postprocedure Evaluation (Signed)
Anesthesia Post Note  Patient: Maddon Kinneman Casasola  Procedure(s) Performed: Procedure(s) (LRB): INTRAMEDULLARY (IM) NAIL INTERTROCHANTRIC (Left)  Patient location during evaluation: PACU Anesthesia Type: General Level of consciousness: awake and alert Pain management: pain level controlled Vital Signs Assessment: post-procedure vital signs reviewed and stable Respiratory status: spontaneous breathing, nonlabored ventilation, respiratory function stable and patient connected to nasal cannula oxygen Cardiovascular status: blood pressure returned to baseline and stable Postop Assessment: no signs of nausea or vomiting Anesthetic complications: no    Last Vitals:  Vitals:   04/14/16 1015 04/14/16 1128  BP:  132/78  Pulse: 76 92  Resp: 13 18  Temp:  36.8 C    Last Pain:  Vitals:   04/14/16 1128  TempSrc: Oral  PainSc: 5                  Martha Clan

## 2016-04-14 NOTE — Anesthesia Preprocedure Evaluation (Deleted)
Anesthesia Evaluation    Airway        Dental   Pulmonary former smoker,           Cardiovascular hypertension,      Neuro/Psych    GI/Hepatic   Endo/Other  diabetes  Renal/GU      Musculoskeletal   Abdominal   Peds  Hematology   Anesthesia Other Findings   Reproductive/Obstetrics                             Anesthesia Physical Anesthesia Plan Anesthesia Quick Evaluation  

## 2016-04-14 NOTE — Progress Notes (Addendum)
[place] [facility]                                                                                                                                                                                            Patient Demographics   Edgar [name], is a 70 y.o. male, DOB - 12/12/1945, NQ:[id number]  Admit date - 04/13/2016   Admitting Physician Corky Mull, MD  Outpatient Primary MD for the patient is Ashok Norris, MD   LOS - 1  Subjective: Patient admitted with a hip fracture had a hip repair earlier today denies any chest pain or shortness of breath    Review of Systems:   CONSTITUTIONAL: No documented fever. No fatigue, weakness. No weight gain, no weight loss.  EYES: No blurry or double vision.  ENT: No tinnitus. No postnasal drip. No redness of the oropharynx.  RESPIRATORY: No cough, no wheeze, no hemoptysis. No dyspnea.  CARDIOVASCULAR: No chest pain. No orthopnea. No palpitations. No syncope.  GASTROINTESTINAL: No nausea, no vomiting or diarrhea. No abdominal pain. No melena or hematochezia.  GENITOURINARY: No dysuria or hematuria.  ENDOCRINE: No polyuria or nocturia. No heat or cold intolerance.  HEMATOLOGY: No anemia. No bruising. No bleeding.  INTEGUMENTARY: No rashes. No lesions.  MUSCULOSKELETAL: No arthritis. No swelling. No gout. Pain in the hip NEUROLOGIC: No numbness, tingling, or ataxia. No seizure-type activity.  PSYCHIATRIC: No anxiety. No insomnia. No ADD.    Vitals:   Vitals:   04/14/16 1003 04/14/16 1015 04/14/16 1128 04/14/16 1307  BP: (!) 91/50  132/78 (!) 148/86  Pulse: 77 76 92 75  Resp:  13 18 20   Temp: 99 F (37.2 C)  98.3 F (36.8 C) 98.4 F (36.9 C)  TempSrc:   Oral Oral  SpO2:  100% 95% 100%  Weight:      Height:        Wt Readings from Last 3 Encounters:  04/13/16 300 lb (136.1 kg)  12/23/15 279 lb (126.6 kg)  07/04/15 288 lb 3.2 oz (130.7 kg)     Intake/Output Summary (Last 24 hours) at  04/14/16 1346 Last data filed at 04/14/16 1100  Gross per 24 hour  Intake             1800 ml  Output             2306 ml  Net             -506 ml    Physical Exam:   GENERAL: Pleasant-appearing in no apparent distress.  HEAD, EYES, EARS, NOSE AND THROAT: Atraumatic, normocephalic. Extraocular muscles are  intact. Pupils equal and reactive to light. Sclerae anicteric. No conjunctival injection. No oro-pharyngeal erythema.  NECK: Supple. There is no jugular venous distention. No bruits, no lymphadenopathy, no thyromegaly.  HEART: Regular rate and rhythm,. No murmurs, no rubs, no clicks.  LUNGS: Clear to auscultation bilaterally. No rales or rhonchi. No wheezes.  ABDOMEN: Soft, flat, nontender, nondistended. Has good bowel sounds. No hepatosplenomegaly appreciated.  EXTREMITIES: No evidence of any cyanosis, clubbing, or peripheral edema.  +2 pedal and radial pulses bilaterally.  NEUROLOGIC: The patient is alert, awake, and oriented x3 with no focal motor or sensory deficits appreciated bilaterally.  SKIN: Moist and warm with no rashes appreciated.  Psych: Not anxious, depressed LN: No inguinal LN enlargement    Antibiotics   Anti-infectives    Start     Dose/Rate Route Frequency Ordered Stop   04/14/16 1315  ceFAZolin (ANCEF) 3 g in dextrose 5 % 50 mL IVPB     3 g 130 mL/hr over 30 Minutes Intravenous Every 6 hours 04/14/16 1303 04/15/16 0714   04/13/16 1729  ceFAZolin (ANCEF) 3 g in dextrose 5 % 50 mL IVPB  Status:  Discontinued     3 g 130 mL/hr over 30 Minutes Intravenous 30 min pre-op 04/13/16 1729 04/14/16 1121      Medications   Scheduled Meds: . acetaminophen  1,000 mg Oral Q6H  . amLODipine  5 mg Oral Daily  . aspirin EC  81 mg Oral Daily  . atorvastatin  40 mg Oral QHS  .  ceFAZolin (ANCEF) IV  3 g Intravenous Q6H  . cholecalciferol  1,000 Units Oral BID  . docusate sodium  100 mg Oral BID  . enoxaparin (LOVENOX) injection  40 mg Subcutaneous BID  . fentaNYL       . insulin aspart  0-9 Units Subcutaneous TID WC  . insulin glargine  30 Units Subcutaneous Q2200  . ketorolac  15 mg Intravenous Q6H  . ketorolac      . losartan  100 mg Oral Daily  . omega-3 acid ethyl esters  1 g Oral Daily  . pantoprazole  40 mg Oral BID   Continuous Infusions: . 0.9 % NaCl with KCl 20 mEq / L 100 mL/hr at 04/14/16 1231   PRN Meds:.acetaminophen **OR** acetaminophen, bisacodyl, diphenhydrAMINE, diphenhydrAMINE, HYDROmorphone (DILAUDID) injection, magnesium hydroxide, metoCLOPramide **OR** metoCLOPramide (REGLAN) injection, ondansetron **OR** ondansetron (ZOFRAN) IV, oxyCODONE, sodium phosphate   Data Review:   Micro Results Recent Results (from the past 240 hour(s))  Surgical pcr screen     Status: None   Collection Time: 04/13/16  6:15 PM  Result Value Ref Range Status   MRSA, PCR NEGATIVE NEGATIVE Final   Staphylococcus aureus NEGATIVE NEGATIVE Final    Comment:        The Xpert SA Assay (FDA approved for NASAL specimens in patients over 31 years of age), is one component of a comprehensive surveillance program.  Test performance has been validated by [facility] for patients greater than or equal to 8 year old. It is not intended to diagnose infection nor to guide or monitor treatment.     Radiology Reports Dg Chest 1 View  Result Date: 04/13/2016 CLINICAL DATA:  [place] from standing position landing on the left side. EXAM: CHEST 1 VIEW COMPARISON:  None. FINDINGS: Heart size is at the upper limits of normal. The aorta shows ectasia. The pulmonary vascularity is normal. Lungs are clear. No bone abnormality seen. IMPRESSION: Ectatic aorta.  No active disease. Electronically Signed  By: Nelson Chimes M.D.   On: 04/13/2016 15:57   Ct Head Wo Contrast  Result Date: 04/13/2016 CLINICAL DATA:  Pain following fall EXAM: CT HEAD WITHOUT CONTRAST TECHNIQUE: Contiguous axial images were obtained from the base of the skull through the vertex without intravenous  contrast. COMPARISON:  Brain MRI October 12, 2008 FINDINGS: Brain: There is age related volume loss. The ventricles are normal in configuration. There is no intracranial mass, hemorrhage, extra-axial fluid collection, or midline shift. There is minimal small vessel disease in the centra semiovale bilaterally. Elsewhere gray-white compartments appear normal. No acute infarct evident. Vascular: There are no hyperdense vessels appreciable. There is a small amount of calcification in the left carotid siphon. Skull: The bony calvarium appears intact. There are calcifications along the falx which appear benign. There is a small bony enostosis along the right frontal bone which appears benign. There is no edema in these areas. There is mucosal thickening in several ethmoid air cells bilaterally. There is somewhat ill-defined opacity in the superior right sphenoid sinus as well as in a posterior right ethmoid air cell. Other visualized paranasal sinuses are clear. Visualized orbits appear symmetric bilaterally. Other: Visualized mastoid air cells are clear. IMPRESSION: Minimal periventricular small vessel disease. No intracranial mass, hemorrhage, or extra-axial fluid collection. No acute infarct evident. There are areas of paranasal sinus disease. There is a rather minimal amount of calcification in the left carotid siphon. Electronically Signed   By: Lowella Grip III M.D.   On: 04/13/2016 15:18   Dg Hip Operative Unilat W Or W/o Pelvis Left  Result Date: 04/14/2016 CLINICAL DATA:  Intra medullary rod fixation of left femur fracture. EXAM: OPERATIVE LEFT HIP (WITH PELVIS IF PERFORMED) 4 VIEWS TECHNIQUE: Fluoroscopic spot image(s) were submitted for interpretation post-operatively. COMPARISON:  Left hip radiographs - 04/13/2016 FINDINGS: 4 spot intraoperative fluoroscopic images of the right hip and femur are provided for review Images demonstrate the sequela of intra medullary rod fixation of the left femur with  dynamic screw fixation of the left femoral neck. The distal end of the femoral rod is transfixed with a single cancellous screw. Overall improved alignment of previously noted intertrochanteric femur fracture with minimal residual displacement. There is a minimal subcutaneous emphysema about the operative site. No radiopaque foreign body. IMPRESSION: Post intra medullary rod fixation of the left femur and dynamic screw fixation of left femoral neck without evidence of complication. Electronically Signed   By: Sandi Mariscal M.D.   On: 04/14/2016 10:43   Dg Hip Unilat W Or Wo Pelvis 2-3 Views Left  Result Date: 04/13/2016 CLINICAL DATA:  Right status post fall trying to run from bees. Left hip pain. Initial encounter. EXAM: DG HIP (WITH OR WITHOUT PELVIS) 2-3V LEFT COMPARISON:  None. FINDINGS: The patient has an acute left intertrochanteric fracture which is mildly comminuted. The femoral head is located. No other acute abnormality is seen. Lower lumbar spondylosis noted. IMPRESSION: Acute left intertrochanteric fracture. Electronically Signed   By: Inge Rise M.D.   On: 04/13/2016 16:00     CBC  Recent Labs Lab 04/13/16 1621 04/14/16 0444  WBC 10.8* 10.5  HGB 13.5 12.4*  HCT 39.7* 35.9*  PLT 238 219  MCV 85.6 85.2  MCH 29.2 29.3  MCHC 34.1 34.5  RDW 14.3 14.0  LYMPHSABS 1.7  --   MONOABS 0.7  --   EOSABS 0.1  --   BASOSABS 0.0  --     Chemistries   Recent Labs Lab 04/13/16 1621  04/14/16 0444  NA 137 136  K 3.6 3.4*  CL 105 102  CO2 26 27  GLUCOSE 183* 163*  BUN 13 12  CREATININE 0.90 0.85  CALCIUM 9.6 9.2   ------------------------------------------------------------------------------------------------------------------ estimated creatinine clearance is 117.2 mL/min (by C-G formula based on SCr of 0.85 mg/dL). ------------------------------------------------------------------------------------------------------------------ No results for input(s): HGBA1C in the last 72  hours. ------------------------------------------------------------------------------------------------------------------ No results for input(s): CHOL, HDL, LDLCALC, TRIG, CHOLHDL, LDLDIRECT in the last 72 hours. ------------------------------------------------------------------------------------------------------------------ No results for input(s): TSH, T4TOTAL, T3FREE, THYROIDAB in the last 72 hours.  Invalid input(s): FREET3 ------------------------------------------------------------------------------------------------------------------ No results for input(s): VITAMINB12, FOLATE, FERRITIN, TIBC, IRON, RETICCTPCT in the last 72 hours.  Coagulation profile  Recent Labs Lab 04/13/16 1621  INR 0.98    No results for input(s): DDIMER in the last 72 hours.  Cardiac Enzymes No results for input(s): CKMB, TROPONINI, MYOGLOBIN in the last 168 hours.  Invalid input(s): CK ------------------------------------------------------------------------------------------------------------------ Invalid input(s): [place]  Patient is a 70 year old status post hip fracture  *Left hip fracture Status post repair  Early ambulation  Likely will need rehabilitation      * Diabetes    resume his home dose Lantus blood sugars are elevated  * Hypertension    blood pressure stable continue Norvasc  *Hyperlipidemia continue atorvastatin  *HypokalemiaCheck potassium in the morning    All the records are reviewed and case discussed with ED provider.     Code Status Orders        Start     Ordered   04/13/16 1757  Full code  Continuous     04/13/16 1756    Code Status History    Date Active Date Inactive Code Status Order ID Comments User Context   This patient has a current code status but no historical code status.           Consults  17min   DVT Prophylaxis  Lovenox   Lab Results  Component Value Date   PLT 219 04/14/2016     Time Spent  in minutes 34min  Greater than 50% of time spent in care coordination and counseling patient regarding the condition and plan of care.   Dustin Flock M.D on 04/14/2016 at 1:46 PM  Between 7am to 6pm - Pager - [phone]  After 6pm go to [email or web address] - password [email or web address] [facility]   Office  [phone]

## 2016-04-14 NOTE — Progress Notes (Signed)
70 y/o M ordered Lovenox 40 mg daily for DVT prophylaxis. Due to weight > 100 kg and BMI > 40, will increase Lovenox dosing to 40 mg q 12 hours.   Ulice Dash, PharmD Clinical Pharmacist

## 2016-04-14 NOTE — Anesthesia Preprocedure Evaluation (Addendum)
Anesthesia Evaluation  Patient identified by MRN, date of birth, ID band Patient awake    Reviewed: Allergy & Precautions, NPO status , Patient's Chart, lab work & pertinent test results  History of Anesthesia Complications Negative for: history of anesthetic complications  Airway Mallampati: III  TM Distance: >3 FB Neck ROM: Full    Dental no notable dental hx.    Pulmonary neg pulmonary ROS, former smoker,    breath sounds clear to auscultation       Cardiovascular Exercise Tolerance: Good hypertension, Pt. 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   Neuro/Psych neg Seizures negative psych ROS   GI/Hepatic negative GI ROS, Neg liver ROS, neg GERD  ,  Endo/Other  diabetes, Well Controlled, Type 2, Insulin DependentMorbid obesity  Renal/GU negative Renal ROS     Musculoskeletal negative musculoskeletal ROS (+)   Abdominal   Peds  Hematology  (+) DOES NOT REFUSE BLOOD PRODUCTS,   Anesthesia Other Findings Past Medical History: No date: Diabetes mellitus without complication ([place]) 123456: Essential hypertension, benign 12/23/2015: Hx of tobacco use, presenting hazards to health     Comment: Quit prior to 1997 12/23/2015: Hyperlipidemia No date: Hypertension 12/23/2015: Obesity   Reproductive/Obstetrics                            Anesthesia Physical Anesthesia Plan  ASA: III  Anesthesia Plan: General   Post-op Pain Management:    Induction:   Airway Management Planned:   Additional Equipment:   Intra-op Plan:   Post-operative Plan:   Informed Consent: I have reviewed the patients History and Physical, chart, labs and discussed the procedure including the risks, benefits and alternatives for the proposed anesthesia with the patient or authorized representative who has indicated his/her understanding and  acceptance.     Plan Discussed with: Anesthesiologist  Anesthesia Plan Comments:         Anesthesia Quick Evaluation

## 2016-04-14 NOTE — Op Note (Signed)
04/13/2016 - 04/14/2016  10:02 AM  Patient:   Edgar [name]  Pre-Op Diagnosis:   Displaced 4 part intertrochanteric fracture, left hip.  Post-Op Diagnosis:   Same.  Procedure:   Reduction and internal fixation of four-part intertrochanteric left hip fracture with Biomet Affyxis TFN nail.  Surgeon:   Pascal Lux, MD  Assistant:   None  Anesthesia:   IV sedation  Findings:   As above  Complications:   None  EBL:   150 cc  Fluids:   900 cc crystalloid  UOP:   10 cc  TT:   None  Drains:   None  Closure:   Staples  Implants:   Biomet Affyxis 11 x 400 mm TFN with a 115 mm lag screw and a 46 mm distal interlocking screw  Brief Clinical Note:   The patient is a 70 year old male who sustained the above-noted injury yesterday when he lost his balance and fell while turning to avoid being attacked by bees. He presented to the emergency room where x-rays demonstrated the above-noted injury. The patient has been cleared medically and presents at this time for reduction and internal fixation of the displaced intertrochanteric left hip fracture.  Procedure:   The patient was brought into the operating room. After adequate iv sedation was obtained, the patient was lain in the supine position on the fracture table. The uninjured leg was placed in a flexed and abducted position while the injured lower extremity was placed in longitudinal traction. The fracture was reduced using longitudinal traction and internal rotation. The adequacy of reduction was verified fluoroscopically in AP and lateral projections and found to be near anatomic. The lateral aspects of the left hip and thigh were prepped with ChloraPrep solution before being draped sterilely. Preoperative antibiotics were administered. The greater trochanter was identified fluoroscopically and an approximately 3 cm incision made about 2-3 fingerbreadths above the tip of the greater trochanter. The incision was carried down through the  subcutaneous tissues to expose the gluteal fascia. This was split the length of the incision, providing access to the tip of the trochanter. Under fluoroscopic guidance, a guidewire was drilled through the tip of the trochanter into the proximal metaphysis to the level of the lesser trochanter. After verifying its position fluoroscopically in AP and lateral projections, it was overreamed with the initial reamer to the depth of the lesser trochanter. A guidewire was passed down through the femoral canal to the supracondylar region. The adequacy of guidewire position was verified fluoroscopically in AP and lateral projections before the length of the guidewire within the canal was measured and found to be 430 mm, so it was elected to proceed with a 400 mm length nail. The femoral canal was reamed sequentially using the flexible reamers, beginning with a 9 mm reamer and progressing to a 13 mm reamer. This provided good cortical chatter. The 11 x 400 mm Biomet Affyxis TFN rod was selected and advanced to the appropriate depth, as verified fluoroscopically. The guide system for the lag screw was positioned and advanced through an approximately 2 cm stab incision over the lateral aspect of the proximal femur. The guidewire was drilled up through the trochanteric femoral nail and into the femoral neck to rest within 5 mm of subchondral bone. After verifying its position in the femoral neck and head in both AP and lateral projections, the guidewire was measured and found to be optimally replicated by a [id number] mm lag screw. The guidewire was overreamed to the appropriate  depth before the lag screw was inserted and advanced to the appropriate depth as verified fluoroscopically in AP and lateral projections. The locking screw was advanced, then backed off a quarter turn to set the lag screw. Again the adequacy of hardware position and fracture reduction was verified fluoroscopically in AP and lateral projections and found to be  excellent.  Attention was directed distally. Using the "perfect circle" technique, the leg and fluoroscopy machine were positioned appropriately. An approximate 1.5 cm stab incision was made over the skin at the appropriate point before the drill bit was advanced through the cortex and across the static hole of the nail. The appropriate length of the screw was determined before the 46 mm distal interlocking screw was positioned, then advanced and tightened securely. Again the adequacy of screw position was verified fluoroscopically in AP and lateral projections and found to be excellent.  The wounds were irrigated thoroughly with sterile saline solution before the deeper subcutaneous tissues were closed using 2-0 Vicryl interrupted sutures. The skin was closed using staples. Sterile occlusive dressings were applied to all wounds before the patient was transferred back to his/her hospital bed. The patient was then transferred to the recovery room in satisfactory condition after tolerating the procedure well.

## 2016-04-14 NOTE — Progress Notes (Signed)
Pt c/o unable to sleep, on call Dr. Jannifer Franklin paged, ordered for Benadryl 25mg  PO prn for sleep. Will administer and continue to monitor.

## 2016-04-14 NOTE — Clinical Social Work Note (Signed)
Clinical Social Work Assessment  Patient Details  Name: Edgar [name] MRN: [id number] Date of Birth: 1945/11/24  Date of referral:  04/14/16               Reason for consult:  Facility Placement                Permission sought to share information with:  Family Supports Permission granted to share information::  Yes, Verbal Permission Granted  Name::     [place]::     Relationship::  Spouse  Contact Information:  [phone]  Housing/Transportation Living arrangements for the past 2 months:  [place] of Information:  Patient, Spouse Patient Interpreter Needed:  None Criminal Activity/Legal Involvement Pertinent to Current Situation/Hospitalization:  No - Comment as needed Significant Relationships:  Adult Children, [place], [employer] Lives with:  Spouse Do you feel safe going back to the place where you live?  Yes Need for family participation in patient care:  No (Coment)  Care giving concerns:  STR placement   Social Worker assessment / plan:  Patient was resting in his bed with multiple family members at bedside. Patient gave verbal consent to conduct SNF referral process and deferred to his wife to make preference decisions.   At baseline, patient is independent with all ADLs and IADLs. Patient presented at [facility] ED with hip pain following a fall when running away from a swarm of bees. Patient had hip fracture repaired. PT recommendation is pending, but ortho suspects SNF will be rec.  Patient's preference is for [facility] or [facility]. CSW advised patient and his wife of financial obligation. Patient and his wife agreed to referral process.  Employment status:  Retired Forensic scientist:  Self Pay (Medicaid Pending) PT Recommendations:  Not assessed at this time Information / Referral to community resources:  [place]  Patient/Family's Response to care:  Patient and spouse were pleasant and receptive to care.  Both thanked CSW for involvement.  Patient/Family's Understanding of and Emotional Response to Diagnosis, Current Treatment, and Prognosis:  Patient and spouse were able to verbalize in their own terms the possible dc options and need for rehabilitation post-op.  Emotional Assessment Appearance:  Appears stated age Attitude/Demeanor/Rapport:   (Pleasant) Affect (typically observed):  Accepting, Appropriate, Pleasant Orientation:  Oriented to Self, Oriented to Place, Oriented to  Time, Oriented to Situation Alcohol / Substance use:  Never Used Psych involvement (Current and /or in the community):  No (Comment)  Discharge Needs  Concerns to be addressed:  Discharge Planning Concerns Readmission within the last 30 days:  No Current discharge risk:  None Barriers to Discharge:  Continued Medical Work up   [employer], LCSW 04/14/2016, 3:40 PM

## 2016-04-14 NOTE — Progress Notes (Signed)
Patient A&Ox4 at this time. POx0, FSBS elevated with sliding scale given per order. Full sensation to bilateral LE. Foot pumps in place at this time.

## 2016-04-15 ENCOUNTER — Inpatient Hospital Stay: Payer: Medicare Other

## 2016-04-15 DIAGNOSIS — Z452 Encounter for adjustment and management of vascular access device: Secondary | ICD-10-CM

## 2016-04-15 DIAGNOSIS — S72002A Fracture of unspecified part of neck of left femur, initial encounter for closed fracture: Secondary | ICD-10-CM

## 2016-04-15 DIAGNOSIS — R0602 Shortness of breath: Secondary | ICD-10-CM

## 2016-04-15 DIAGNOSIS — A419 Sepsis, unspecified organism: Secondary | ICD-10-CM

## 2016-04-15 LAB — GLUCOSE, CAPILLARY
Glucose-Capillary: 138 mg/dL — ABNORMAL HIGH (ref 65–99)
Glucose-Capillary: 164 mg/dL — ABNORMAL HIGH (ref 65–99)
Glucose-Capillary: 175 mg/dL — ABNORMAL HIGH (ref 65–99)
Glucose-Capillary: 141 mg/dL — ABNORMAL HIGH (ref 65–99)
Glucose-Capillary: 133 mg/dL — ABNORMAL HIGH (ref 65–99)

## 2016-04-15 LAB — URINALYSIS COMPLETE WITH MICROSCOPIC (ARMC ONLY)
Bilirubin Urine: NEGATIVE
Glucose, UA: 50 mg/dL — AB
Ketones, ur: NEGATIVE mg/dL
Nitrite: NEGATIVE
Protein, ur: 100 mg/dL — AB
Specific Gravity, Urine: 1.023 (ref 1.005–1.030)
pH: 5 (ref 5.0–8.0)

## 2016-04-15 LAB — APTT: aPTT: 114 seconds — ABNORMAL HIGH (ref 24–36)

## 2016-04-15 LAB — CBC WITH DIFFERENTIAL/PLATELET
Basophils Absolute: 0 10*3/uL (ref 0–0.1)
Basophils Relative: 0 %
Eosinophils Absolute: 0.3 10*3/uL (ref 0–0.7)
Eosinophils Relative: 3 %
HCT: 30.1 % — ABNORMAL LOW (ref 40.0–52.0)
Hemoglobin: 10.4 g/dL — ABNORMAL LOW (ref 13.0–18.0)
Lymphocytes Relative: 11 %
Lymphs Abs: 1.4 10*3/uL (ref 1.0–3.6)
MCH: 29.6 pg (ref 26.0–34.0)
MCHC: 34.6 g/dL (ref 32.0–36.0)
MCV: 85.8 fL (ref 80.0–100.0)
Monocytes Absolute: 0.7 10*3/uL (ref 0.2–1.0)
Monocytes Relative: 6 %
Neutro Abs: 9.6 10*3/uL — ABNORMAL HIGH (ref 1.4–6.5)
Neutrophils Relative %: 80 %
Platelets: 160 10*3/uL (ref 150–440)
RBC: 3.51 MIL/uL — ABNORMAL LOW (ref 4.40–5.90)
RDW: 13.8 % (ref 11.5–14.5)
WBC: 12 10*3/uL — ABNORMAL HIGH (ref 3.8–10.6)

## 2016-04-15 LAB — BASIC METABOLIC PANEL
Anion gap: 4 — ABNORMAL LOW (ref 5–15)
BUN: 19 mg/dL (ref 6–20)
CO2: 26 mmol/L (ref 22–32)
Calcium: 8.6 mg/dL — ABNORMAL LOW (ref 8.9–10.3)
Chloride: 107 mmol/L (ref 101–111)
Creatinine, Ser: 1.07 mg/dL (ref 0.61–1.24)
GFR calc Af Amer: >60 mL/min (ref >60)
GFR calc non Af Amer: >60 mL/min (ref >60)
Glucose, Bld: 142 mg/dL — ABNORMAL HIGH (ref 65–99)
Potassium: 4 mmol/L (ref 3.5–5.1)
Sodium: 137 mmol/L (ref 135–145)

## 2016-04-15 LAB — TROPONIN I
Troponin I: <0.03 ng/mL (ref <0.03)
Troponin I: <0.03 ng/mL (ref <0.03)

## 2016-04-15 LAB — CKMB (ARMC ONLY): CK, MB: 1.7 ng/mL (ref 0.5–5.0)

## 2016-04-15 LAB — HEPARIN LEVEL (UNFRACTIONATED): Heparin Unfractionated: 0.45 IU/mL (ref 0.30–0.70)

## 2016-04-15 LAB — PROTIME-INR
INR: 1.14
Prothrombin Time: 14.7 seconds (ref 11.4–15.2)

## 2016-04-15 LAB — MRSA PCR SCREENING: MRSA by PCR: NEGATIVE

## 2016-04-15 LAB — PROCALCITONIN: Procalcitonin: 2.55 ng/mL

## 2016-04-15 LAB — LACTIC ACID, PLASMA: Lactic Acid, Venous: 1.3 mmol/L (ref 0.5–1.9)

## 2016-04-15 MED ORDER — SODIUM CHLORIDE 0.45 % IV SOLN
INTRAVENOUS | Status: DC
  Administered 2016-04-15 – 2016-04-18 (×3): via INTRAVENOUS

## 2016-04-15 MED ORDER — VANCOMYCIN HCL 10 G IV SOLR
1250.0000 mg | Freq: Two times a day (BID) | INTRAVENOUS | Status: DC
  Administered 2016-04-15: 1250 mg via INTRAVENOUS
  Filled 2016-04-15 (×3): qty 1250

## 2016-04-15 MED ORDER — PIPERACILLIN-TAZOBACTAM 3.375 G IVPB
3.3750 g | Freq: Three times a day (TID) | INTRAVENOUS | Status: DC
  Administered 2016-04-15 – 2016-04-16 (×3): 3.375 g via INTRAVENOUS
  Filled 2016-04-15 (×3): qty 50

## 2016-04-15 MED ORDER — MORPHINE SULFATE (PF) 2 MG/ML IV SOLN: 2.0000 mg | INTRAVENOUS | Status: DC | PRN

## 2016-04-15 MED ORDER — IOPAMIDOL (ISOVUE-370) INJECTION 76%
75.0000 mL | Freq: Once | INTRAVENOUS | Status: AC | PRN
  Administered 2016-04-15: 75 mL via INTRAVENOUS

## 2016-04-15 MED ORDER — HEPARIN BOLUS VIA INFUSION
6000.0000 [IU] | Freq: Once | INTRAVENOUS | Status: AC
  Administered 2016-04-15: 6000 [IU] via INTRAVENOUS
  Filled 2016-04-15: qty 6000

## 2016-04-15 MED ORDER — FUROSEMIDE 10 MG/ML IJ SOLN: 20.0000 mg | Freq: Once | INTRAMUSCULAR | Status: DC

## 2016-04-15 MED ORDER — VANCOMYCIN HCL 10 G IV SOLR
1500.0000 mg | Freq: Once | INTRAVENOUS | Status: AC
  Administered 2016-04-15: 1500 mg via INTRAVENOUS
  Filled 2016-04-15: qty 1500

## 2016-04-15 MED ORDER — FUROSEMIDE 10 MG/ML IJ SOLN
INTRAMUSCULAR | Status: AC
  Administered 2016-04-15: 14:00:00
  Filled 2016-04-15: qty 4

## 2016-04-15 MED ORDER — ALUM & MAG HYDROXIDE-SIMETH 200-200-20 MG/5ML PO SUSP
30.0000 mL | ORAL | Status: DC | PRN
  Administered 2016-04-15: 30 mL via ORAL
  Filled 2016-04-15: qty 30

## 2016-04-15 MED ORDER — MORPHINE SULFATE (PF) 2 MG/ML IV SOLN
2.0000 mg | INTRAVENOUS | Status: AC
  Administered 2016-04-15: 2 mg via INTRAVENOUS

## 2016-04-15 MED ORDER — IPRATROPIUM-ALBUTEROL 0.5-2.5 (3) MG/3ML IN SOLN
3.0000 mL | Freq: Four times a day (QID) | RESPIRATORY_TRACT | Status: DC
  Administered 2016-04-15 – 2016-04-16 (×3): 3 mL via RESPIRATORY_TRACT
  Filled 2016-04-15 (×4): qty 3

## 2016-04-15 MED ORDER — SODIUM CHLORIDE 0.9 % IV BOLUS (SEPSIS)
1000.0000 mL | Freq: Once | INTRAVENOUS | Status: AC
  Administered 2016-04-15: 1000 mL via INTRAVENOUS

## 2016-04-15 MED ORDER — LABETALOL HCL 5 MG/ML IV SOLN
10.0000 mg | INTRAVENOUS | Status: DC | PRN
  Filled 2016-04-15: qty 4

## 2016-04-15 MED ORDER — HEPARIN (PORCINE) IN NACL 100-0.45 UNIT/ML-% IJ SOLN
1750.0000 [IU]/h | INTRAMUSCULAR | Status: DC
  Administered 2016-04-15 – 2016-04-16 (×2): 1750 [IU]/h via INTRAVENOUS
  Filled 2016-04-15 (×4): qty 250

## 2016-04-15 MED ORDER — MORPHINE SULFATE (PF) 2 MG/ML IV SOLN
INTRAVENOUS | Status: AC
  Administered 2016-04-15: 2 mg via INTRAVENOUS
  Filled 2016-04-15: qty 1

## 2016-04-15 NOTE — Progress Notes (Signed)
PT Cancellation Note  Patient Details Name: Edgar [name] MRN: [id number] DOB: 04-Jul-1946   Cancelled Treatment:    Reason Eval/Treat Not Completed: Medical issues which prohibited therapy  Pt with rapid response call with BP, resiratory rate, heart rate increases and transfer to the CCU.  PT held, will need new PT orders when pt is appropriate.     Kreg Shropshire, DPT 04/15/2016, 11:54 AM

## 2016-04-15 NOTE — Plan of Care (Signed)
Problem: Pain Managment: Goal: General experience of comfort will improve Outcome: Progressing Patient's level of pain seemed to be improved with pain medication administration. Able to rest comfortable after administration.  Problem: Fluid Volume: Goal: Ability to maintain a balanced intake and output will improve Outcome: Progressing Patient received IV fluid bolus and has voided many times this shift.

## 2016-04-15 NOTE — Progress Notes (Addendum)
eLink Physician-Brief Progress Note Patient Name: Edgar [name] DOB: [birth year] MRN: [id number]   Date of Service  04/15/2016  HPI/Events of Note  PE - Chest CTA finding c/w acute pulmonary emboli are seen involving the upper and lower lobe branches of the left pulmonary artery.   eICU Interventions  Will order Heparin IV infusion per pharmacy consult.      Intervention Category Major Interventions: Other:  [name] 04/15/2016, 4:35 PM

## 2016-04-15 NOTE — Consult Note (Signed)
Pharmacy Antibiotic Note  Edgar [name] is a 70 y.o. male admitted on 04/13/2016 with hip fracture, now with sepsis.  Pharmacy has been consulted for vancomycin and zosyn dosing.  Plan: Vancomycin 1500mg  once follow by 1250mg  in 6 hours for stacked dosing Vancomycin 1250mg  IV every 12 hours.  Goal trough 15-20 mcg/mL. Zosyn 3.375g IV q8h (4 hour infusion). Trough at steady state 9/12 @ 0630 Height: 6' (182.9 cm) Weight: 300 lb (136.1 kg) IBW/kg (Calculated) : 77.6  Temp (24hrs), Avg:98.8 F (37.1 C), Min:98.1 F (36.7 C), Max:100.7 F (38.2 C)   Recent Labs Lab 04/13/16 1621 04/14/16 0444 04/15/16 0555  WBC 10.8* 10.5 12.0*  CREATININE 0.90 0.85 1.07    Estimated Creatinine Clearance: 93.1 mL/min (by C-G formula based on SCr of 1.07 mg/dL).    No Known Allergies  Antimicrobials this admission: vancomycin 9/10 >>  zosyn 9/10 >>   Dose adjustments this admission:   Microbiology results: 9/10 BCx:  9/10 UCx:     MRSA PCR: neg  Thank you for allowing pharmacy to be a part of this patient's care.  Ramond Dial 04/15/2016 11:43 AM

## 2016-04-15 NOTE — Progress Notes (Signed)
At approximately [place], [place] called and asked me to please come and start IV in room 157. Upon arrival to room, pt. found to be awake and alert with labored breathing, elevated HR and BP. 02 sat 91%. Patient also complaining of abd. pain.  He is 1 day p/o L hip repair. RT had already been paged and was at bedside to give neb. I asked Helene Kelp to go ahead and page rapid response. Durene Fruits, [place] from ICU arrived. Dr. Ulysees Barns at bedside and ordered morphine and lasix IV stat. Meds given by primary care nurse. Dr. Ulysees Barns ordered ICU stepdown bed. I assisted with transfer to ICU.

## 2016-04-15 NOTE — Progress Notes (Signed)
[facility]                                                                                                                                                                                            Patient Demographics   Edgar [name], is a 70 y.o. male, DOB - [birth year], NQ:[id number]  Admit date - 04/13/2016   Admitting Physician Corky Mull, MD  Outpatient Primary MD for the patient is Ashok Norris, MD   LOS - 2  Subjective: Called by nurse due to patient having tachycardia heart rate in the 130s his shivering. Is uncomfortable   Review of Systems:   CONSTITUTIONAL: Positive chills  No fatigue, weakness. No weight gain, no weight loss.  EYES: No blurry or double vision.  ENT: No tinnitus. No postnasal drip. No redness of the oropharynx.  RESPIRATORY: No cough, no wheeze, no hemoptysis. No dyspnea.  CARDIOVASCULAR: No chest pain. No orthopnea. No palpitations. No syncope.  GASTROINTESTINAL: No nausea, no vomiting or diarrhea. No abdominal pain. No melena or hematochezia.  GENITOURINARY: No dysuria or hematuria.  ENDOCRINE: No polyuria or nocturia. No heat or cold intolerance.  HEMATOLOGY: No anemia. No bruising. No bleeding.  INTEGUMENTARY: No rashes. No lesions.  MUSCULOSKELETAL: No arthritis. No swelling. No gout. Pain in the hip NEUROLOGIC: No numbness, tingling, or ataxia. No seizure-type activity.  PSYCHIATRIC: No anxiety. No insomnia. No ADD.    Vitals:   Vitals:   04/15/16 0422 04/15/16 0813 04/15/16 1116 04/15/16 1138  BP: 123/68 129/70  128/79  Pulse: 73 78    Resp: 20 18  (!) 28  Temp: 98.7 F (37.1 C) 98.1 F (36.7 C)  (!) 100.7 F (38.2 C)  TempSrc: Oral Oral  Oral  SpO2: 100% 100% 91% 91%  Weight:    293 lb 3.4 oz (133 kg)  Height:    6' (1.829 m)    Wt Readings from Last 3 Encounters:  04/15/16 293 lb 3.4 oz (133 kg)  12/23/15 279 lb (126.6 kg)  07/04/15 288 lb 3.2 oz (130.7 kg)      Intake/Output Summary (Last 24 hours) at 04/15/16 1234 Last data filed at 04/15/16 0800  Gross per 24 hour  Intake          2138.33 ml  Output             1000 ml  Net          1138.33 ml    Physical Exam:   GENERAL: Appears uncomfortable HEAD, EYES, EARS, NOSE AND THROAT: Atraumatic, normocephalic. Extraocular muscles are intact. Pupils equal and reactive  to light. Sclerae anicteric. No conjunctival injection. No oro-pharyngeal erythema.  NECK: Supple. There is no jugular venous distention. No bruits, no lymphadenopathy, no thyromegaly.  HEART: Tachycardic Regular rate and rhythm,. No murmurs, no rubs, no clicks.  LUNGS: Clear to auscultation bilaterally. No rales or rhonchi. No wheezes.  ABDOMEN: Soft, flat, nontender, nondistended. Has good bowel sounds. No hepatosplenomegaly appreciated.  EXTREMITIES: No evidence of any cyanosis, clubbing, or peripheral edema.  +2 pedal and radial pulses bilaterally.  NEUROLOGIC: The patient is alert, awake, and oriented x3 with no focal motor or sensory deficits appreciated bilaterally.  SKIN: Moist and warm with no rashes appreciated.  Psych: Not anxious, depressed LN: No inguinal LN enlargement    Antibiotics   Anti-infectives    Start     Dose/Rate Route Frequency Ordered Stop   04/15/16 1900  vancomycin (VANCOCIN) 1,250 mg in sodium chloride 0.9 % 250 mL IVPB     1,250 mg 166.7 mL/hr over 90 Minutes Intravenous Every 12 hours 04/15/16 1142     04/15/16 1200  piperacillin-tazobactam (ZOSYN) IVPB 3.375 g     3.375 g 12.5 mL/hr over 240 Minutes Intravenous Every 8 hours 04/15/16 1135     04/15/16 1145  vancomycin (VANCOCIN) 1,500 mg in sodium chloride 0.9 % 500 mL IVPB     1,500 mg 250 mL/hr over 120 Minutes Intravenous  Once 04/15/16 1134     04/14/16 1315  ceFAZolin (ANCEF) 3 g in dextrose 5 % 50 mL IVPB     3 g 130 mL/hr over 30 Minutes Intravenous Every 6 hours 04/14/16 1303 04/15/16 0043   04/13/16 1729  ceFAZolin (ANCEF) 3 g  in dextrose 5 % 50 mL IVPB  Status:  Discontinued     3 g 130 mL/hr over 30 Minutes Intravenous 30 min pre-op 04/13/16 1729 04/14/16 1121      Medications   Scheduled Meds: . amLODipine  5 mg Oral Daily  . aspirin EC  81 mg Oral Daily  . atorvastatin  40 mg Oral QHS  . cholecalciferol  1,000 Units Oral BID  . docusate sodium  100 mg Oral BID  . enoxaparin (LOVENOX) injection  40 mg Subcutaneous BID  . furosemide      . insulin aspart  0-9 Units Subcutaneous TID WC  . insulin glargine  56 Units Subcutaneous Q2200  . ipratropium-albuterol  3 mL Nebulization Q6H  . losartan  100 mg Oral Daily  .  morphine injection  2 mg Intravenous STAT  . omega-3 acid ethyl esters  1 g Oral Daily  . pantoprazole  40 mg Oral BID  . piperacillin-tazobactam (ZOSYN)  IV  3.375 g Intravenous Q8H  . vancomycin  1,250 mg Intravenous Q12H  . vancomycin  1,500 mg Intravenous Once   Continuous Infusions:   PRN Meds:.acetaminophen **OR** acetaminophen, bisacodyl, diphenhydrAMINE, diphenhydrAMINE, labetalol, magnesium hydroxide, metoCLOPramide **OR** metoCLOPramide (REGLAN) injection, morphine injection, ondansetron **OR** ondansetron (ZOFRAN) IV, oxyCODONE, sodium phosphate   Data Review:   Micro Results Recent Results (from the past 240 hour(s))  Surgical pcr screen     Status: None   Collection Time: 04/13/16  6:15 PM  Result Value Ref Range Status   MRSA, PCR NEGATIVE NEGATIVE Final   Staphylococcus aureus NEGATIVE NEGATIVE Final    Comment:        The Xpert SA Assay (FDA approved for NASAL specimens in patients over 2 years of age), is one component of a comprehensive surveillance program.  Test performance has been validated by [facility] for patients greater than or equal to 66 year old. It is not intended to diagnose infection nor to guide or monitor treatment.     Radiology Reports Dg Chest 1 View  Result Date: 04/15/2016 CLINICAL DATA:  Postop for hip surgery, shortness of  Breath EXAM: CHEST 1 VIEW COMPARISON:  04/13/2016 FINDINGS: Cardiomediastinal silhouette is stable. Mild elevation of the right hemidiaphragm again noted. No acute infiltrate or pleural effusion. No pulmonary edema. IMPRESSION: No active disease. Electronically Signed   By: Lahoma Crocker M.D.   On: 04/15/2016 11:25   Dg Chest 1 View  Result Date: 04/13/2016 CLINICAL DATA:  [place] from standing position landing on the left side. EXAM: CHEST 1 VIEW COMPARISON:  None. FINDINGS: Heart size is at the upper limits of normal. The aorta shows ectasia. The pulmonary vascularity is normal. Lungs are clear. No bone abnormality seen. IMPRESSION: Ectatic aorta.  No active disease. Electronically Signed   By: Nelson Chimes M.D.   On: 04/13/2016 15:57   Ct Head Wo Contrast  Result Date: 04/13/2016 CLINICAL DATA:  Pain following fall EXAM: CT HEAD WITHOUT CONTRAST TECHNIQUE: Contiguous axial images were obtained from the base of the skull through the vertex without intravenous contrast. COMPARISON:  Brain MRI October 12, 2008 FINDINGS: Brain: There is age related volume loss. The ventricles are normal in configuration. There is no intracranial mass, hemorrhage, extra-axial fluid collection, or midline shift. There is minimal small vessel disease in the centra semiovale bilaterally. Elsewhere gray-white compartments appear normal. No acute infarct evident. Vascular: There are no hyperdense vessels appreciable. There is a small amount of calcification in the left carotid siphon. Skull: The bony calvarium appears intact. There are calcifications along the falx which appear benign. There is a small bony enostosis along the right frontal bone which appears benign. There is no edema in these areas. There is mucosal thickening in several ethmoid air cells bilaterally. There is somewhat ill-defined opacity in the superior right sphenoid sinus as well as in a posterior right ethmoid air cell. Other visualized paranasal sinuses are clear.  Visualized orbits appear symmetric bilaterally. Other: Visualized mastoid air cells are clear. IMPRESSION: Minimal periventricular small vessel disease. No intracranial mass, hemorrhage, or extra-axial fluid collection. No acute infarct evident. There are areas of paranasal sinus disease. There is a rather minimal amount of calcification in the left carotid siphon. Electronically Signed   By: Lowella Grip III M.D.   On: 04/13/2016 15:18   Dg Hip Operative Unilat W Or W/o Pelvis Left  Result Date: 04/14/2016 CLINICAL DATA:  Intra medullary rod fixation of left femur fracture. EXAM: OPERATIVE LEFT HIP (WITH PELVIS IF PERFORMED) 4 VIEWS TECHNIQUE: Fluoroscopic spot image(s) were submitted for interpretation post-operatively. COMPARISON:  Left hip radiographs - 04/13/2016 FINDINGS: 4 spot intraoperative fluoroscopic images of the right hip and femur are provided for review Images demonstrate the sequela of intra medullary rod fixation of the left femur with dynamic screw fixation of the left femoral neck. The distal end of the femoral rod is transfixed with a single cancellous screw. Overall improved alignment of previously noted intertrochanteric femur fracture with minimal residual displacement. There is a minimal subcutaneous emphysema about the operative site. No radiopaque foreign body. IMPRESSION: Post intra medullary rod fixation of the left femur and dynamic screw fixation of left femoral neck without evidence of complication. Electronically Signed   By: Sandi Mariscal M.D.   On: 04/14/2016 10:43   Dg Hip Unilat W Or Wo Pelvis 2-3 Views  Left  Result Date: 04/13/2016 CLINICAL DATA:  Right status post fall trying to run from bees. Left hip pain. Initial encounter. EXAM: DG HIP (WITH OR WITHOUT PELVIS) 2-3V LEFT COMPARISON:  None. FINDINGS: The patient has an acute left intertrochanteric fracture which is mildly comminuted. The femoral head is located. No other acute abnormality is seen. Lower lumbar  spondylosis noted. IMPRESSION: Acute left intertrochanteric fracture. Electronically Signed   By: Inge Rise M.D.   On: 04/13/2016 16:00     CBC  Recent Labs Lab 04/13/16 1621 04/14/16 0444 04/15/16 0555  WBC 10.8* 10.5 12.0*  HGB 13.5 12.4* 10.4*  HCT 39.7* 35.9* 30.1*  PLT 238 219 160  MCV 85.6 85.2 85.8  MCH 29.2 29.3 29.6  MCHC 34.1 34.5 34.6  RDW 14.3 14.0 13.8  LYMPHSABS 1.7  --  1.4  MONOABS 0.7  --  0.7  EOSABS 0.1  --  0.3  BASOSABS 0.0  --  0.0    Chemistries   Recent Labs Lab 04/13/16 1621 04/14/16 0444 04/15/16 0555  NA 137 136 137  K 3.6 3.4* 4.0  CL 105 102 107  CO2 26 27 26   GLUCOSE 183* 163* 142*  BUN 13 12 19   CREATININE 0.90 0.85 1.07  CALCIUM 9.6 9.2 8.6*   ------------------------------------------------------------------------------------------------------------------ estimated creatinine clearance is 92 mL/min (by C-G formula based on SCr of 1.07 mg/dL). ------------------------------------------------------------------------------------------------------------------ No results for input(s): HGBA1C in the last 72 hours. ------------------------------------------------------------------------------------------------------------------ No results for input(s): CHOL, HDL, LDLCALC, TRIG, CHOLHDL, LDLDIRECT in the last 72 hours. ------------------------------------------------------------------------------------------------------------------ No results for input(s): TSH, T4TOTAL, T3FREE, THYROIDAB in the last 72 hours.  Invalid input(s): FREET3 ------------------------------------------------------------------------------------------------------------------ No results for input(s): VITAMINB12, FOLATE, FERRITIN, TIBC, IRON, RETICCTPCT in the last 72 hours.  Coagulation profile  Recent Labs Lab 04/13/16 1621  INR 0.98    No results for input(s): DDIMER in the last 72 hours.  Cardiac Enzymes No results for input(s): CKMB, TROPONINI,  MYOGLOBIN in the last 168 hours.  Invalid input(s): CK ------------------------------------------------------------------------------------------------------------------ Invalid input(s): [place]  Patient is a 70 year old status post hip fracture   *Suspect sepsis At this time until we know the source I will place  him on empiric   Zosyn and vancomycin Obtain a stat chest x-ray I will also obtain a urinalysis  *Tachycardia with accelerated hypertension We'll use labetalol IV when necessary for elevated blood pressure and heart rate   *Left hip fracture Status post repair    * Diabetes    continue Lantus  * Hypertension Accelerated above continue Norvasc and when necessary labetalol  *Hyperlipidemia continue atorvastatin  *Hypokalemia Potassium is normal  All the records are reviewed and case discussed with ED provider.     Code Status Orders        Start     Ordered   04/13/16 1757  Full code  Continuous     04/13/16 1756    Code Status History    Date Active Date Inactive Code Status Order ID Comments User Context   This patient has a current code status but no historical code status.           Consults Orthopedics   DVT Prophylaxis  Lovenox   Lab Results  Component Value Date   PLT 160 04/15/2016     Time Spent in minutes 65minAgo care time spent patient will be moved to the stepdown  Greater than 50% of time spent in care coordination and counseling patient regarding the  condition and plan of care.   Dustin Flock M.D on 04/15/2016 at 12:34 PM  Between 7am to 6pm - Pager - [phone]  After 6pm go to [email or web address] - password [email or web address] [facility]   Office  [phone]

## 2016-04-15 NOTE — Significant Event (Signed)
Rapid Response Event Note  Overview:  called for rapid response for resp distress and htn, pt laying in bed receiving breathing tx, nursing supervisor in room placing iv    Initial Focused Assessment: tachypnic, unable to speak, hypertensive and tachycardic on monitor.   Interventions: Dr Ulysees Barns to bedside, agreed on tx to stepdown bed for further monitoring. CXR, EKG, labs and lasix and morphine ordered.  Plan of Care (if not transferred):  Event Summary:   at      at          [place]

## 2016-04-15 NOTE — Procedures (Signed)
Central Venous Catheter Placement: Indication: Patient receiving vesicant or irritant drug.; Patient receiving intravenous therapy for longer than 5 days.; Patient has limited or no vascular access.   Consent:emergent.   Risks and benefits explained in detail including risk of infection, bleeding, respiratory failure and death..   Hand washing performed prior to starting the procedure.   Procedure: An active timeout was performed and correct patient, name, & ID confirmed.  After explaining risk and benefits, patient was positioned correctly for central venous access. Patient was prepped using strict sterile technique including chlorohexadine preps, sterile drape, sterile gown and sterile gloves.  The area was prepped, draped and anesthetized in the usual sterile manner. Patient comfort was obtained.  A triple lumen catheter was placed in left Internal Jugular Vein There was good blood return, catheter caps were placed on lumens, catheter flushed easily, the line was secured and a sterile dressing and BIO-PATCH applied.   Ultrasound was used to visualize vasculature and guidance of needle.   Number of Attempts: 1 Complications:none Estimated Blood Loss: none Chest Radiograph indicated and ordered.    Marda Stalker, M.D.  Pulmonary & Critical Care Medicine

## 2016-04-15 NOTE — Progress Notes (Addendum)
Pt complaints of nausea this am during med pass, gave pain medication and prn medication. Patient assessed with CN to gain IV access to reveal patient shaking, warm to touch, tachycardic, and wheezing. Previous conversation with MD to obtain urine, cxr. CN called nursing supervisor for IV access. Upon further assessment called for neb treatment. RR called at approx 11:30ish. Order to be transferred to ICU rm 4

## 2016-04-15 NOTE — Progress Notes (Signed)
Received call from MD Ram possibly two hours ago and he informed me that the CT resulted in the patient having a PE and he would be ordering heparin. Heparin was never ordered. Called Elink, asked to have MD call back, no return phone call. Paged MD Posey Pronto, he stated to order Heparin gtt per PE and pharmacy would dose drip. See new orders. Will continue to monitor patient.

## 2016-04-15 NOTE — Progress Notes (Signed)
  Subjective: 1 Day Post-Op Procedure(s) (LRB): INTRAMEDULLARY (IM) NAIL INTERTROCHANTRIC (Left) Patient reports pain as mild.   Patient seen in rounds with Dr. Roland Rack. Patient is well, and has had no acute complaints or problems Plan is to go Home versus rehabilitation, depending on physical therapy after hospital stay. Negative for chest pain and shortness of breath Fever: no Gastrointestinal: Negative for nausea and vomiting  Objective: Vital signs in last 24 hours: Temp:  [98.1 F (36.7 C)-99.8 F (37.7 C)] 98.7 F (37.1 C) (09/10 0422) Pulse Rate:  [73-92] 73 (09/10 0422) Resp:  [13-20] 20 (09/10 0422) BP: (91-148)/(50-86) 123/68 (09/10 0422) SpO2:  [95 %-100 %] 100 % (09/10 0422) FiO2 (%):  [28 %] 28 % (09/09 1303)  Intake/Output from previous day:  Intake/Output Summary (Last 24 hours) at 04/15/16 0649 Last data filed at 04/14/16 1800  Gross per 24 hour  Intake             1805 ml  Output             1106 ml  Net              699 ml    Intake/Output this shift: No intake/output data recorded.  Labs:  Recent Labs  04/13/16 1621 04/14/16 0444 04/15/16 0555  HGB 13.5 12.4* 10.4*    Recent Labs  04/14/16 0444 04/15/16 0555  WBC 10.5 12.0*  RBC 4.22* 3.51*  HCT 35.9* 30.1*  PLT 219 160    Recent Labs  04/14/16 0444 04/15/16 0555  NA 136 137  K 3.4* 4.0  CL 102 107  CO2 27 26  BUN 12 19  CREATININE 0.85 1.07  GLUCOSE 163* 142*  CALCIUM 9.2 8.6*    Recent Labs  04/13/16 1621  INR 0.98     EXAM General - Patient is Alert and Oriented Extremity - Dorsiflexion/Plantar flexion intact No cellulitis present Compartment soft Dressing/Incision - clean, dry, no drainage Motor Function - intact, moving foot and toes well on exam.   Past Medical History:  Diagnosis Date  . Diabetes mellitus without complication ([place])   . Essential hypertension, benign 12/23/2015  . Hx of tobacco use, presenting hazards to health 12/23/2015   Quit prior to  1997  . Hyperlipidemia 12/23/2015  . Hypertension   . Obesity 12/23/2015    Assessment/Plan: 1 Day Post-Op Procedure(s) (LRB): INTRAMEDULLARY (IM) NAIL INTERTROCHANTRIC (Left) Principal Problem:   Closed left hip fracture (HCC) Active Problems:   Hip fracture (HCC)  Estimated body mass index is 40.69 kg/m as calculated from the following:   Height as of this encounter: 6' (1.829 m).   Weight as of this encounter: 136.1 kg (300 lb). Advance diet Up with therapy D/C IV fluids  DVT Prophylaxis - Lovenox Weight-Bearing as tolerated to left leg  Reche Dixon, PA-C Orthopaedic Surgery 04/15/2016, 6:49 AM

## 2016-04-15 NOTE — Consult Note (Signed)
[facility] Consultation     ASSESSMENT/PLAN   70 yo male left left hip frx s/p internal fixation intertrochanteric nail, now presents to ICU with dyspnea, AMS, tachycardia.   PULMONARY A:Acute hypoxic respiratory failure, CXR 9/10 image reviewed, slightly elevated right diaphragm, otherwise unremarkable.  P:   --R/o Sepsis, will give IVF for possible dehydration, significant respiratory variability seen on IJ ultrasound.  --Will check CT chest to r/o PE.  CARDIOVASCULAR A: Tachycardia, possibly secondary to fever vs. Other.  Hypertension, controlled.  P:  Continue to monitor.  ECG tracings reviewed; no acute changes, will check troponin.   RENAL A:  AKI.  P:   Will give IVF bolus before sending for CT chest.   GASTROINTESTINAL A:  --  HEMATOLOGIC A:  Leukocytosis.   P:  Likely reactive.   INFECTIOUS A:  ?Sepsis P:   Will pan-culture.   Micro/culture results:  BCx2 -- UC -- Sputum--  Antibiotics: Vancomycin 9/10>> Zosyn 9/10>>  ENDOCRINE A: DM.  P:   Continue lantus 56 units daily and SSI.   NEUROLOGIC A:  AMS, suspect due to metabolic encephalopathy.  P:   Possibly due to hypoxia, will continue to monitor.    MAJOR EVENTS/TEST RESULTS:   Best Practices  DVT Prophylaxis: on lovenox.  GI Prophylaxis:--   ---------------------------------------  ---------------------------------------   Name: Edgar [name] MRN: [id number] DOB: July 03, 1946    ADMISSION DATE:  04/13/2016 CONSULTATION DATE:  04/15/16  REFERRING MD :  Dr. Posey Pronto  CHIEF COMPLAINT:  Dyspnea.    HISTORY OF PRESENT ILLNESS:    Patient is a 70 year old male, currently has altered mental status and is not able to give a full history. Therefore, all history was obtained from the chart and from staff, case was discussed with orthopedic surgery. He is postop day 1 from left hip intramedullary nail secondary to fracture. The patient developed fever of 100.7  with tachycardia of 118 bpm with a respiratory rate of 22, late this morning. Subsequently, rapid response was called, and the patient was transferred to the intensive care unit. Since his arrival here, he has been somewhat confused, he is oriented to self, but little else. Review of his chest x-ray images are unremarkable, as well as EKG tracings. The patient has been maintained on room air to 2 L of oxygen throughout his hospitalization, he is currently on 3 L nasal cannula with oxygen saturation 91%. He has no complaints at this time, the surgical site was examined by orthopedic surgeon and was thought to be unremarkable. The patient has no particular complaints at this time.  PAST MEDICAL HISTORY :  Past Medical History:  Diagnosis Date  . Diabetes mellitus without complication ([place])   . Essential hypertension, benign 12/23/2015  . Hx of tobacco use, presenting hazards to health 12/23/2015   Quit prior to 1997  . Hyperlipidemia 12/23/2015  . Hypertension   . Obesity 12/23/2015   Past Surgical History:  Procedure Laterality Date  . EYE SURGERY     Prior to Admission medications   Medication Sig Start Date End Date Taking? Authorizing Provider  acetaminophen (TYLENOL) 500 MG tablet Take 500 mg by mouth every 6 (six) hours as needed.   Yes Historical Provider, MD  amLODipine (NORVASC) 5 MG tablet TAKE 1 TABLET EVERY DAY 12/22/15  Yes Ashok Norris, MD  aspirin EC 81 MG tablet Take 81 mg by mouth daily.   Yes Historical Provider, MD  atorvastatin (LIPITOR) 40 MG tablet TAKE 1 TABLET  BY MOUTH AT BEDTIME 03/14/16  Yes Arnetha Courser, MD  cholecalciferol (VITAMIN D) 1000 units tablet Take 1,000 Units by mouth 2 (two) times daily.    Yes Historical Provider, MD  Fish Oil-Cholecalciferol (FISH OIL + D3) 1200-1000 MG-UNIT CAPS Take 1,200 mg by mouth daily.   Yes Historical Provider, MD  HYDROcodone-acetaminophen (NORCO) 10-325 MG tablet Take 1 tablet by mouth every 8 (eight) hours as needed. 10/18/15   Yes Ashok Norris, MD  insulin regular (HUMULIN R) 100 units/mL injection Inject 0.11 mLs (11 Units total) into the skin 3 (three) times daily before meals. This replaces NOVOLOG (this is short-acting insulin) 01/10/16  Yes Arnetha Courser, MD  LANTUS SOLOSTAR 100 UNIT/ML Solostar Pen INJECT 56 UNITS SUBCUTANEOUSLY ONCE A DAY 04/11/16  Yes Arnetha Courser, MD  losartan (COZAAR) 100 MG tablet Take 1 tablet (100 mg total) by mouth daily. 03/30/16  Yes Arnetha Courser, MD   No Known Allergies  FAMILY HISTORY:  Family History  Problem Relation Age of Onset  . Cancer Mother   . Diabetes Mother   . Heart disease Father    SOCIAL HISTORY:  reports that he has quit smoking. He has never used smokeless tobacco. He reports that he does not drink alcohol or use drugs.  REVIEW OF SYSTEMS:   Could not provide a review of systems due to altered mental status.   VITAL SIGNS: Temp:  [98.1 F (36.7 C)-100.7 F (38.2 C)] 100.7 F (38.2 C) (09/10 1138) Pulse Rate:  [73-83] 78 (09/10 0813) Resp:  [18-28] 28 (09/10 1138) BP: (123-148)/(64-86) 128/79 (09/10 1138) SpO2:  [91 %-100 %] 91 % (09/10 1138) FiO2 (%):  [28 %] 28 % (09/09 1303) Weight:  [293 lb 3.4 oz (133 kg)] 293 lb 3.4 oz (133 kg) (09/10 1138) HEMODYNAMICS:   VENTILATOR SETTINGS: FiO2 (%):  [28 %] 28 % INTAKE / OUTPUT:  Intake/Output Summary (Last 24 hours) at 04/15/16 1247 Last data filed at 04/15/16 0800  Gross per 24 hour  Intake          2138.33 ml  Output             1000 ml  Net          1138.33 ml    Physical Examination:   VS: BP 128/79   Pulse 78   Temp (!) 100.7 F (38.2 C) (Oral)   Resp (!) 28   Ht 6' (1.829 m)   Wt 293 lb 3.4 oz (133 kg)   SpO2 91%   BMI 39.77 kg/m   General Appearance: No distress  Neuro:without focal findings, mental status: Confused HEENT: PERRLA, EOM intact, no ptosis, no other lesions noticed;  Pulmonary: normal breath sounds., diaphragmatic excursion normal. CardiovascularNormal S1,S2.   No m/r/g.    Abdomen: Benign, Soft, non-tender, No masses, hepatosplenomegaly, No lymphadenopathy Renal:  No costovertebral tenderness  GU:  Not performed at this time. Endoc: No evident thyromegaly, no signs of acromegaly. Skin:   warm, no rashes, no ecchymosis  Extremities: normal, no cyanosis, clubbing, no edema, warm with normal capillary refill.    LABS: Reviewed   LABORATORY PANEL:   CBC  Recent Labs Lab 04/15/16 0555  WBC 12.0*  HGB 10.4*  HCT 30.1*  PLT 160    Chemistries   Recent Labs Lab 04/15/16 0555  NA 137  K 4.0  CL 107  CO2 26  GLUCOSE 142*  BUN 19  CREATININE 1.07  CALCIUM 8.6*     Recent  Labs Lab 04/14/16 1127 04/14/16 1713 04/14/16 2118 04/15/16 0736 04/15/16 1120 04/15/16 1141  GLUCAP 210* 192* 171* 138* 164* 175*   No results for input(s): PHART, PCO2ART, PO2ART in the last 168 hours. No results for input(s): AST, ALT, ALKPHOS, BILITOT, ALBUMIN in the last 168 hours.  Cardiac Enzymes No results for input(s): TROPONINI in the last 168 hours.  RADIOLOGY:  Dg Chest 1 View  Result Date: 04/15/2016 CLINICAL DATA:  Postop for hip surgery, shortness of Breath EXAM: CHEST 1 VIEW COMPARISON:  04/13/2016 FINDINGS: Cardiomediastinal silhouette is stable. Mild elevation of the right hemidiaphragm again noted. No acute infiltrate or pleural effusion. No pulmonary edema. IMPRESSION: No active disease. Electronically Signed   By: Lahoma Crocker M.D.   On: 04/15/2016 11:25   Dg Chest 1 View  Result Date: 04/13/2016 CLINICAL DATA:  [place] from standing position landing on the left side. EXAM: CHEST 1 VIEW COMPARISON:  None. FINDINGS: Heart size is at the upper limits of normal. The aorta shows ectasia. The pulmonary vascularity is normal. Lungs are clear. No bone abnormality seen. IMPRESSION: Ectatic aorta.  No active disease. Electronically Signed   By: Nelson Chimes M.D.   On: 04/13/2016 15:57   Ct Head Wo Contrast  Result Date: 04/13/2016 CLINICAL  DATA:  Pain following fall EXAM: CT HEAD WITHOUT CONTRAST TECHNIQUE: Contiguous axial images were obtained from the base of the skull through the vertex without intravenous contrast. COMPARISON:  Brain MRI October 12, 2008 FINDINGS: Brain: There is age related volume loss. The ventricles are normal in configuration. There is no intracranial mass, hemorrhage, extra-axial fluid collection, or midline shift. There is minimal small vessel disease in the centra semiovale bilaterally. Elsewhere gray-white compartments appear normal. No acute infarct evident. Vascular: There are no hyperdense vessels appreciable. There is a small amount of calcification in the left carotid siphon. Skull: The bony calvarium appears intact. There are calcifications along the falx which appear benign. There is a small bony enostosis along the right frontal bone which appears benign. There is no edema in these areas. There is mucosal thickening in several ethmoid air cells bilaterally. There is somewhat ill-defined opacity in the superior right sphenoid sinus as well as in a posterior right ethmoid air cell. Other visualized paranasal sinuses are clear. Visualized orbits appear symmetric bilaterally. Other: Visualized mastoid air cells are clear. IMPRESSION: Minimal periventricular small vessel disease. No intracranial mass, hemorrhage, or extra-axial fluid collection. No acute infarct evident. There are areas of paranasal sinus disease. There is a rather minimal amount of calcification in the left carotid siphon. Electronically Signed   By: Lowella Grip III M.D.   On: 04/13/2016 15:18   Dg Hip Operative Unilat W Or W/o Pelvis Left  Result Date: 04/14/2016 CLINICAL DATA:  Intra medullary rod fixation of left femur fracture. EXAM: OPERATIVE LEFT HIP (WITH PELVIS IF PERFORMED) 4 VIEWS TECHNIQUE: Fluoroscopic spot image(s) were submitted for interpretation post-operatively. COMPARISON:  Left hip radiographs - 04/13/2016 FINDINGS: 4 spot  intraoperative fluoroscopic images of the right hip and femur are provided for review Images demonstrate the sequela of intra medullary rod fixation of the left femur with dynamic screw fixation of the left femoral neck. The distal end of the femoral rod is transfixed with a single cancellous screw. Overall improved alignment of previously noted intertrochanteric femur fracture with minimal residual displacement. There is a minimal subcutaneous emphysema about the operative site. No radiopaque foreign body. IMPRESSION: Post intra medullary rod fixation of the left femur and  dynamic screw fixation of left femoral neck without evidence of complication. Electronically Signed   By: Sandi Mariscal M.D.   On: 04/14/2016 10:43   Dg Hip Unilat W Or Wo Pelvis 2-3 Views Left  Result Date: 04/13/2016 CLINICAL DATA:  Right status post fall trying to run from bees. Left hip pain. Initial encounter. EXAM: DG HIP (WITH OR WITHOUT PELVIS) 2-3V LEFT COMPARISON:  None. FINDINGS: The patient has an acute left intertrochanteric fracture which is mildly comminuted. The femoral head is located. No other acute abnormality is seen. Lower lumbar spondylosis noted. IMPRESSION: Acute left intertrochanteric fracture. Electronically Signed   By: Inge Rise M.D.   On: 04/13/2016 16:00       --Marda Stalker, MD.  Board Certified in Internal Medicine, Pulmonary Medicine, [place], and Sleep Medicine.  ICU Pager [phone] [facility] Office Number: [phone]  Patricia Pesa, M.D.  Vilinda Boehringer, M.D.  Merton Border, M.D   04/15/2016, 12:47 PM  [place].  I have personally obtained a history, examined the patient, evaluated laboratory and imaging results, formulated the assessment and plan and placed orders. The Patient requires high complexity decision making for assessment and support, frequent evaluation and titration of therapies, application of advanced  monitoring technologies and extensive interpretation of multiple databases. The patient has critical illness that could lead imminently to failure of 1 or more organ systems and requires the highest level of physician preparedness to intervene.  Critical Care Time devoted to patient care services described in this note is 35 minutes and is exclusive of time spent in procedures.

## 2016-04-15 NOTE — Consult Note (Signed)
ANTICOAGULATION CONSULT NOTE - Initial Consult  Pharmacy Consult for heparin drip Indication: pulmonary embolus  No Known Allergies  Patient Measurements: Height: 6' (182.9 cm) Weight: 293 lb 3.4 oz (133 kg) IBW/kg (Calculated) : 77.6 Heparin Dosing Weight: 107.8kg  Vital Signs: Temp: 98.9 F (37.2 C) (09/10 1400) Temp Source: Oral (09/10 1400) BP: 105/57 (09/10 1530) Pulse Rate: 89 (09/10 1530)  Labs:  Recent Labs  04/13/16 1621 04/14/16 0444 04/15/16 0555 04/15/16 1258  HGB 13.5 12.4* 10.4*  --   HCT 39.7* 35.9* 30.1*  --   PLT 238 219 160  --   LABPROT 13.0  --   --   --   INR 0.98  --   --   --   CREATININE 0.90 0.85 1.07  --   CKMB  --   --   --  1.7  TROPONINI  --   --   --  <0.03    Estimated Creatinine Clearance: 92 mL/min (by C-G formula based on SCr of 1.07 mg/dL).   Medical History: Past Medical History:  Diagnosis Date  . Diabetes mellitus without complication ([place])   . Essential hypertension, benign 12/23/2015  . Hx of tobacco use, presenting hazards to health 12/23/2015   Quit prior to 1997  . Hyperlipidemia 12/23/2015  . Hypertension   . Obesity 12/23/2015    Medications:  Scheduled:  . amLODipine  5 mg Oral Daily  . aspirin EC  81 mg Oral Daily  . atorvastatin  40 mg Oral QHS  . cholecalciferol  1,000 Units Oral BID  . docusate sodium  100 mg Oral BID  . heparin  6,000 Units Intravenous Once  . insulin aspart  0-9 Units Subcutaneous TID WC  . insulin glargine  56 Units Subcutaneous Q2200  . ipratropium-albuterol  3 mL Nebulization Q6H  . losartan  100 mg Oral Daily  . omega-3 acid ethyl esters  1 g Oral Daily  . pantoprazole  40 mg Oral BID  . piperacillin-tazobactam (ZOSYN)  IV  3.375 g Intravenous Q8H  . vancomycin  1,250 mg Intravenous Q12H    Assessment: Pt is a 70 year old male currently in the ICU found to have pulmonary emboli on CT scan. Pharmacy consulted to dose heparin drip. Pt was not on anticoagulation at home, was on  Lovenox in hospital, last dose 40mg  at 0800 this AM. Baseline labs ordered as add on.  Goal of Therapy:  Heparin level 0.3-0.7 units/ml Monitor platelets by anticoagulation protocol: Yes   Plan:  Give 6000 units bolus x 1 Start heparin infusion at 1750 units/hr Check anti-Xa level in 6 hours and daily while on heparin Continue to monitor H&H and platelets  Hogan Hoobler D Nickolette Espinola, Pharm.D Clinical Pharmacist  04/15/2016,4:41 PM

## 2016-04-16 ENCOUNTER — Inpatient Hospital Stay: Payer: Medicare Other

## 2016-04-16 ENCOUNTER — Encounter: Payer: Self-pay | Admitting: Surgery

## 2016-04-16 DIAGNOSIS — I2699 Other pulmonary embolism without acute cor pulmonale: Secondary | ICD-10-CM

## 2016-04-16 LAB — GLUCOSE, CAPILLARY
Glucose-Capillary: 172 mg/dL — ABNORMAL HIGH (ref 65–99)
Glucose-Capillary: 151 mg/dL — ABNORMAL HIGH (ref 65–99)
Glucose-Capillary: 194 mg/dL — ABNORMAL HIGH (ref 65–99)
Glucose-Capillary: 158 mg/dL — ABNORMAL HIGH (ref 65–99)
Glucose-Capillary: 172 mg/dL — ABNORMAL HIGH (ref 65–99)

## 2016-04-16 LAB — BASIC METABOLIC PANEL
Anion gap: 5 (ref 5–15)
BUN: 19 mg/dL (ref 6–20)
CO2: 27 mmol/L (ref 22–32)
Calcium: 8.5 mg/dL — ABNORMAL LOW (ref 8.9–10.3)
Chloride: 105 mmol/L (ref 101–111)
Creatinine, Ser: 0.93 mg/dL (ref 0.61–1.24)
GFR calc Af Amer: >60 mL/min (ref >60)
GFR calc non Af Amer: >60 mL/min (ref >60)
Glucose, Bld: 131 mg/dL — ABNORMAL HIGH (ref 65–99)
Potassium: 3.6 mmol/L (ref 3.5–5.1)
Sodium: 137 mmol/L (ref 135–145)

## 2016-04-16 LAB — CBC
HCT: 27.4 % — ABNORMAL LOW (ref 40.0–52.0)
Hemoglobin: 9.5 g/dL — ABNORMAL LOW (ref 13.0–18.0)
MCH: 29.5 pg (ref 26.0–34.0)
MCHC: 34.7 g/dL (ref 32.0–36.0)
MCV: 85.1 fL (ref 80.0–100.0)
Platelets: 153 10*3/uL (ref 150–440)
RBC: 3.22 MIL/uL — ABNORMAL LOW (ref 4.40–5.90)
RDW: 14 % (ref 11.5–14.5)
WBC: 14.3 10*3/uL — ABNORMAL HIGH (ref 3.8–10.6)

## 2016-04-16 LAB — HEPARIN LEVEL (UNFRACTIONATED): Heparin Unfractionated: 0.5 IU/mL (ref 0.30–0.70)

## 2016-04-16 LAB — TROPONIN I: Troponin I: <0.03 ng/mL (ref <0.03)

## 2016-04-16 LAB — MAGNESIUM: Magnesium: 1.8 mg/dL (ref 1.7–2.4)

## 2016-04-16 LAB — PROCALCITONIN: Procalcitonin: 8.14 ng/mL

## 2016-04-16 MED ORDER — SODIUM CHLORIDE 0.9% FLUSH
10.0000 mL | Freq: Two times a day (BID) | INTRAVENOUS | Status: DC
  Administered 2016-04-16: 30 mL
  Administered 2016-04-17: 10 mL

## 2016-04-16 MED ORDER — APIXABAN 5 MG PO TABS: 5.0000 mg | ORAL_TABLET | Freq: Two times a day (BID) | ORAL | Status: DC

## 2016-04-16 MED ORDER — FENTANYL CITRATE (PF) 100 MCG/2ML IJ SOLN: 12.5000 ug | INTRAMUSCULAR | Status: DC | PRN

## 2016-04-16 MED ORDER — LACTULOSE 10 GM/15ML PO SOLN
30.0000 g | Freq: Once | ORAL | Status: AC
Start: 2016-04-16 — End: 2016-04-16
  Administered 2016-04-16: 30 g via ORAL
  Filled 2016-04-16: qty 60

## 2016-04-16 MED ORDER — PROCHLORPERAZINE EDISYLATE 5 MG/ML IJ SOLN
10.0000 mg | Freq: Four times a day (QID) | INTRAMUSCULAR | Status: DC | PRN
  Administered 2016-04-16: 10 mg via INTRAVENOUS
  Filled 2016-04-16 (×3): qty 2

## 2016-04-16 MED ORDER — SODIUM CHLORIDE 0.9% FLUSH: 10.0000 mL | INTRAVENOUS | Status: DC | PRN

## 2016-04-16 MED ORDER — APIXABAN 5 MG PO TABS
10.0000 mg | ORAL_TABLET | Freq: Two times a day (BID) | ORAL | Status: DC
  Administered 2016-04-16 – 2016-04-19 (×7): 10 mg via ORAL
  Filled 2016-04-16 (×7): qty 2

## 2016-04-16 MED ORDER — DEXTROSE 5 % IV SOLN
1.0000 g | INTRAVENOUS | Status: DC
  Administered 2016-04-16: 1 g via INTRAVENOUS
  Filled 2016-04-16: qty 10

## 2016-04-16 MED ORDER — CEPHALEXIN 500 MG PO CAPS
500.0000 mg | ORAL_CAPSULE | Freq: Two times a day (BID) | ORAL | Status: DC
  Administered 2016-04-17: 500 mg via ORAL
  Filled 2016-04-16: qty 1

## 2016-04-16 MED ORDER — IPRATROPIUM-ALBUTEROL 0.5-2.5 (3) MG/3ML IN SOLN: 3.0000 mL | Freq: Four times a day (QID) | RESPIRATORY_TRACT | Status: DC | PRN

## 2016-04-16 MED ORDER — DEXTROSE 5 % IV SOLN
1.0000 g | INTRAVENOUS | Status: DC
  Filled 2016-04-16: qty 10

## 2016-04-16 NOTE — Progress Notes (Signed)
Subjective: 2 Days Post-Op Procedure(s) (LRB): INTRAMEDULLARY (IM) NAIL INTERTROCHANTRIC (Left) Patient reports pain as moderate.   Patient is well but recently diagnosed with PE Plan is to go Skilled nursing facility after hospital stay, pending PT Negative for chest pain and shortness of breath this AM.  Pt receiving nebulizer treatment upon entering the room. Fever: no Gastrointestinal:Positive for nausea and vomiting, on IV Compazine  Objective: Vital signs in last 24 hours: Temp:  [98.1 F (36.7 C)-100.7 F (38.2 C)] 98.8 F (37.1 C) (09/11 0600) Pulse Rate:  [72-144] 82 (09/11 0640) Resp:  [11-32] 15 (09/11 0640) BP: (83-201)/(41-88) 104/66 (09/11 0640) SpO2:  [90 %-100 %] 97 % (09/11 0740) Weight:  [133 kg (293 lb 3.4 oz)] 133 kg (293 lb 3.4 oz) (09/10 1138)  Intake/Output from previous day:  Intake/Output Summary (Last 24 hours) at 04/16/16 0749 Last data filed at 04/16/16 0600  Gross per 24 hour  Intake          2588.17 ml  Output             2073 ml  Net           515.17 ml    Intake/Output this shift: No intake/output data recorded.  Labs:  Recent Labs  04/13/16 1621 04/14/16 0444 04/15/16 0555 04/16/16 0417  HGB 13.5 12.4* 10.4* 9.5*    Recent Labs  04/15/16 0555 04/16/16 0417  WBC 12.0* 14.3*  RBC 3.51* 3.22*  HCT 30.1* 27.4*  PLT 160 153    Recent Labs  04/15/16 0555 04/16/16 0417  NA 137 137  K 4.0 3.6  CL 107 105  CO2 26 27  BUN 19 19  CREATININE 1.07 0.93  GLUCOSE 142* 131*  CALCIUM 8.6* 8.5*    Recent Labs  04/13/16 1621 04/15/16 1908  INR 0.98 1.14     EXAM General - Patient is Alert, Appropriate and Oriented Extremity - ABD soft Sensation intact distally Intact pulses distally Dorsiflexion/Plantar flexion intact Incision: scant drainage Dressing/Incision - blood tinged drainage Motor Function - intact, moving foot and toes well on exam.   Lungs clear to auscultation. Mild tympany with percussion of the  stomach.  Normal BS.  Past Medical History:  Diagnosis Date  . Diabetes mellitus without complication ([place])   . Essential hypertension, benign 12/23/2015  . Hx of tobacco use, presenting hazards to health 12/23/2015   Quit prior to 1997  . Hyperlipidemia 12/23/2015  . Hypertension   . Obesity 12/23/2015    Assessment/Plan: 2 Days Post-Op Procedure(s) (LRB): INTRAMEDULLARY (IM) NAIL INTERTROCHANTRIC (Left) Principal Problem:   Closed left hip fracture (HCC) Active Problems:   Hip fracture (HCC)   Encounter for central line placement   SOB (shortness of breath)  Estimated body mass index is 39.77 kg/m as calculated from the following:   Height as of this encounter: 6' (1.829 m).   Weight as of this encounter: 133 kg (293 lb 3.4 oz). Advance diet Up with therapy if patient able to safely participate.  Pt recently diagnosed with PE, started on Heparin.  Pharmacy to monitor. Most recent labs reviewed.  WBC 14.3 this AM, no recent Temp or tachycardia Up with PT pending on condition. Pt will need to have a BM prior to discharge.  DVT Prophylaxis - TED hose and Heparin Weight-Bearing as tolerated to left leg  J. Cameron Proud, PA-C [facility] Orth[facility] 04/16/2016, 7:49 AM

## 2016-04-16 NOTE — Care Management (Signed)
RNCM consult received and will continue to follow. Patient currently in ICU pending PT evaluation. Patient anticipates SNF at discharge; will follow in case that changes. PE- watch for anticoagulant.

## 2016-04-16 NOTE — Progress Notes (Signed)
Left IJ removed without complications, scant bleeding noted, tip intact. Heparin stopped. Eliquis administered.  Transferred to room 135 in stable condition. All belongings transferred with patient and family notified.

## 2016-04-16 NOTE — Consult Note (Signed)
[place] for apixaban dosing  Indication: pulmonary embolus  Assessment: Pharmacy consulted for apixaban dosing for 70 yo male with PE. Patient initially admitted with hip fracture. Patient currently receiving heparin at 1750 units/hr.    Plan:  Will discontinue heparin drip and start patient on apixaban 10mg  PO BID x 7 days followed by apixaban 5mg  PO BID.    No Known Allergies  Patient Measurements: Height: 6' (182.9 cm) Weight: 293 lb 3.4 oz (133 kg) IBW/kg (Calculated) : 77.6 Heparin Dosing Weight: 107.8kg  Vital Signs: Temp: 98.3 F (36.8 C) (09/11 0800) Temp Source: Oral (09/11 0800) BP: 123/69 (09/11 1100) Pulse Rate: 75 (09/11 1100)  Labs:  Recent Labs  04/13/16 1621 04/14/16 0444 04/15/16 0555 04/15/16 1258 04/15/16 1908 04/15/16 2329 04/16/16 0417  HGB 13.5 12.4* 10.4*  --   --   --  9.5*  HCT 39.7* 35.9* 30.1*  --   --   --  27.4*  PLT 238 219 160  --   --   --  153  APTT  --   --   --   --  114*  --   --   LABPROT 13.0  --   --   --  14.7  --   --   INR 0.98  --   --   --  1.14  --   --   HEPARINUNFRC  --   --   --   --   --  0.45 0.50  CREATININE 0.90 0.85 1.07  --   --   --  0.93  CKMB  --   --   --  1.7  --   --   --   TROPONINI  --   --   --  <0.03 <0.03  --  <0.03    Estimated Creatinine Clearance: 105.8 mL/min (by C-G formula based on SCr of 0.93 mg/dL).   Medical History: Past Medical History:  Diagnosis Date  . Diabetes mellitus without complication ([place])   . Essential hypertension, benign 12/23/2015  . Hx of tobacco use, presenting hazards to health 12/23/2015   Quit prior to 1997  . Hyperlipidemia 12/23/2015  . Hypertension   . Obesity 12/23/2015    Medications:  Scheduled:  . amLODipine  5 mg Oral Daily  . apixaban  10 mg Oral BID   Followed by  . [START ON 04/23/2016] apixaban  5 mg Oral BID  . aspirin EC  81 mg Oral Daily  . atorvastatin  40 mg Oral QHS  . [START ON 04/17/2016] cephALEXin   500 mg Oral Q12H  . cholecalciferol  1,000 Units Oral BID  . docusate sodium  100 mg Oral BID  . insulin aspart  0-9 Units Subcutaneous TID WC  . insulin glargine  56 Units Subcutaneous Q2200  . ipratropium-albuterol  3 mL Nebulization Q6H  . losartan  100 mg Oral Daily  . omega-3 acid ethyl esters  1 g Oral Daily  . pantoprazole  40 mg Oral BID  . sodium chloride flush  10-40 mL Intracatheter Q12H   Pharmacy will continue to monitor and adjust per consult.   MLS 04/16/2016,11:16 AM

## 2016-04-16 NOTE — Consult Note (Signed)
[facility] Consultation     ASSESSMENT/PLAN   70 yo male left left hip frx s/p internal fixation intertrochanteric nail, now presents to ICU with dyspnea, AMS, tachycardia. CT chest c/w PE Patient doing well on minimal oxygen therapy on heparin infusion  PULMONARY-PE resp distress improved -oxygen as needed -continue anticoagulation Get Doppler US to assess for DVT   CARDIOVASCULAR -SD status, consider transferring to gen med floor   GASTROINTESTINAL +N/V ?ielus -anti-emetics, lactulose, enema as needed  INFECTIOUS -no indication for abx at this time -will stop all abx   ENDOCRINE A: DM.  P:   Continue lantus 56 units daily and SSI.   NEUROLOGIC Encephalopathy resolved    ---------------------------------------  ---------------------------------------   Name: Edgar [name] MRN: [id number] DOB: [birth year]    ADMISSION DATE:  04/13/2016 CONSULTATION DATE:  04/15/16  REFERRING MD :  Dr. Posey Pronto  CHIEF COMPLAINT:  Dyspnea.    HISTORY OF PRESENT ILLNESS:   Alert and awake, some nausea and vomiting He has no complaints at this time, the surgical site was examined by orthopedic surgeon and was thought to be unremarkable.  The patient has no particular complaints at this time. May consider transferring to gen med floor    Review of Systems:  Gen:  Denies  fever, sweats, chills weigh loss   HEENT: Denies blurred vision, double vision, ear pain, eye pain, hearing loss, nose bleeds, sore throat  Cardiac:  No dizziness, chest pain or heaviness, chest tightness,edema  Resp:   Denies cough or sputum porduction, shortness of breath,wheezing, hemoptysis,   Gi: + nausea or vomiting, diarrhea, +constipation,  Ext:   Denies Joint pain, stiffness or swelling  Skin: Denies  skin rash, easy bruising or bleeding or hives  Endoc:  Denies polyuria, polydipsia , polyphagia or weight change  Psych:   Denies depression, insomnia or  hallucinations   Other:  All other systems negative   VITAL SIGNS: Temp:  [98.1 F (36.7 C)-100.7 F (38.2 C)] 98.8 F (37.1 C) (09/11 0600) Pulse Rate:  [72-144] 82 (09/11 0640) Resp:  [11-32] 15 (09/11 0640) BP: (83-201)/(41-88) 104/66 (09/11 0640) SpO2:  [90 %-100 %] 97 % (09/11 0740) Weight:  [293 lb 3.4 oz (133 kg)] 293 lb 3.4 oz (133 kg) (09/10 1138) HEMODYNAMICS:   VENTILATOR SETTINGS:   INTAKE / OUTPUT:  Intake/Output Summary (Last 24 hours) at 04/16/16 0752 Last data filed at 04/16/16 0600  Gross per 24 hour  Intake          2588.17 ml  Output             2073 ml  Net           515.17 ml    Physical Examination:   VS: BP 104/66   Pulse 82   Temp 98.8 F (37.1 C) (Oral)   Resp 15   Ht 6' (1.829 m)   Wt 293 lb 3.4 oz (133 kg)   SpO2 97%   BMI 39.77 kg/m   General Appearance: No distress  Neuro:without focal findings,  HEENT: PERRLA, EOM intact, no ptosis, no other lesions noticed;  Pulmonary: normal breath sounds., diaphragmatic excursion normal. CardiovascularNormal S1,S2.  No m/r/g.    Abdomen: Benign, Soft, non-tender, No masses, hepatosplenomegaly, No lymphadenopathy Renal:  No costovertebral tenderness  GU:  Not performed at this time. Endoc: No evident thyromegaly, no signs of acromegaly. Skin:   warm, no rashes, no ecchymosis  Extremities: normal, no cyanosis, clubbing, no edema, warm with  normal capillary refill.    LABS: Reviewed   LABORATORY PANEL:   CBC  Recent Labs Lab 04/16/16 0417  WBC 14.3*  HGB 9.5*  HCT 27.4*  PLT 153    Chemistries   Recent Labs Lab 04/16/16 0417  NA 137  K 3.6  CL 105  CO2 27  GLUCOSE 131*  BUN 19  CREATININE 0.93  CALCIUM 8.5*  MG 1.8     Recent Labs Lab 04/15/16 0736 04/15/16 1120 04/15/16 1141 04/15/16 1649 04/15/16 2204 04/16/16 0711  GLUCAP 138* 164* 175* 141* 133* 151*   No results for input(s): PHART, PCO2ART, PO2ART in the last 168 hours. No results for input(s): AST,  ALT, ALKPHOS, BILITOT, ALBUMIN in the last 168 hours.  Cardiac Enzymes  Recent Labs Lab 04/16/16 0417  TROPONINI <0.03    RADIOLOGY:  Dg Chest 1 View  Result Date: 04/15/2016 CLINICAL DATA:  Postop for hip surgery, shortness of Breath EXAM: CHEST 1 VIEW COMPARISON:  04/13/2016 FINDINGS: Cardiomediastinal silhouette is stable. Mild elevation of the right hemidiaphragm again noted. No acute infiltrate or pleural effusion. No pulmonary edema. IMPRESSION: No active disease. Electronically Signed   By: Lahoma Crocker M.D.   On: 04/15/2016 11:25   Ct Angio Chest Pe W Or Wo Contrast  Result Date: 04/15/2016 CLINICAL DATA:  Dyspnea, tachycardia. EXAM: CT ANGIOGRAPHY CHEST WITH CONTRAST TECHNIQUE: Multidetector CT imaging of the chest was performed using the standard protocol during bolus administration of intravenous contrast. Multiplanar CT image reconstructions and MIPs were obtained to evaluate the vascular anatomy. CONTRAST:  75 mL of Isovue 370 intravenously. COMPARISON:  Radiograph of same day. FINDINGS: No pneumothorax or pleural effusion is noted. Mild bilateral posterior basilar subsegmental atelectasis is noted. Filling defects are noted in upper and lower lobe branches of the left pulmonary artery consistent with pulmonary emboli. 4.4 cm ascending thoracic aortic aneurysm is noted. No dissection is noted. Great vessels are widely patent. Visualized portion of upper abdomen is unremarkable. No mediastinal mass or adenopathy is noted. Anterior osteophyte formation is seen throughout the thoracic spine. Review of the MIP images confirms the above findings. IMPRESSION: 4.4 cm ascending thoracic aortic aneurysm. Recommend annual imaging followup by CTA or MRA. This recommendation follows 2010 ACCF/AHA/AATS/ACR/ASA/SCA/SCAI/SIR/STS/SVM Guidelines for the Diagnosis and Management of Patients with Thoracic Aortic Disease. Circulation. 2010; [id number]. Acute pulmonary emboli are seen involving the upper  and lower lobe branches of the left pulmonary artery. Critical Value/emergent results were called by telephone at the time of interpretation on 04/15/2016 at 2:55 pm to Dr. Laverle Hobby , who verbally acknowledged these results. Electronically Signed   By: Marijo Conception, M.D.   On: 04/15/2016 14:56   Dg Chest Port 1 View  Result Date: 04/15/2016 CLINICAL DATA:  Evaluate central line placement. EXAM: PORTABLE CHEST 1 VIEW COMPARISON:  April 15, 2016 FINDINGS: A new left central line terminates with the tip near the caval atrial junction, in good position. No pneumothorax. Mild atelectasis in the bases. No other interval changes or acute abnormalities. IMPRESSION: The left central line is in good position with no pneumothorax. Electronically Signed   By: Dorise Bullion III M.D   On: 04/15/2016 13:06   Dg Hip Operative Unilat W Or W/o Pelvis Left  Result Date: 04/14/2016 CLINICAL DATA:  Intra medullary rod fixation of left femur fracture. EXAM: OPERATIVE LEFT HIP (WITH PELVIS IF PERFORMED) 4 VIEWS TECHNIQUE: Fluoroscopic spot image(s) were submitted for interpretation post-operatively. COMPARISON:  Left hip radiographs - 04/13/2016 FINDINGS:  4 spot intraoperative fluoroscopic images of the right hip and femur are provided for review Images demonstrate the sequela of intra medullary rod fixation of the left femur with dynamic screw fixation of the left femoral neck. The distal end of the femoral rod is transfixed with a single cancellous screw. Overall improved alignment of previously noted intertrochanteric femur fracture with minimal residual displacement. There is a minimal subcutaneous emphysema about the operative site. No radiopaque foreign body. IMPRESSION: Post intra medullary rod fixation of the left femur and dynamic screw fixation of left femoral neck without evidence of complication. Electronically Signed   By: Sandi Mariscal M.D.   On: 04/14/2016 10:43    I have personally obtained a  history, examined the patient, evaluated Pertinent laboratory and RadioGraphic/imaging results, and  formulated the assessment and plan   The Patient requires high complexity decision making for assessment and support, frequent evaluation and titration of therapies.  Patient satisfied with Plan of action and management. All questions answered  Corrin Parker, M.D.  [facility]  Medical Director [place] Director [facility] Cardio-Pulmonary Department

## 2016-04-16 NOTE — Progress Notes (Signed)
Patient stated he has been throwing up and not able to eat food ever since he had surgery 2 days ago. Patient said he did not have any nausea, but he always throws up "out of no where" right after eating. MD notified and ordered a x-ray of the abdomen.   Deri Fuelling, RN

## 2016-04-16 NOTE — Progress Notes (Signed)
[place] [facility]                                                                                                                                                                                            Patient Demographics   Edgar [name], is a 71 y.o. male, DOB - 1946-06-19, [id number]  Admit date - 04/13/2016   Admitting Physician Corky Mull, MD  Outpatient Primary MD for the patient is Ashok Norris, MD   LOS - 3  Subjective: Patient doing better. Continues to have pain in the legs He underwent a CT of the chest which showed PE   Review of Systems:   CONSTITUTIONAL: Positive chills  No fatigue, weakness. No weight gain, no weight loss.  EYES: No blurry or double vision.  ENT: No tinnitus. No postnasal drip. No redness of the oropharynx.  RESPIRATORY: No cough, no wheeze, no hemoptysis. No dyspnea.  CARDIOVASCULAR: No chest pain. No orthopnea. No palpitations. No syncope.  GASTROINTESTINAL: No nausea, no vomiting or diarrhea. No abdominal pain. No melena or hematochezia.  GENITOURINARY: No dysuria or hematuria.  ENDOCRINE: No polyuria or nocturia. No heat or cold intolerance.  HEMATOLOGY: No anemia. No bruising. No bleeding.  INTEGUMENTARY: No rashes. No lesions.  MUSCULOSKELETAL: No arthritis. No swelling. No gout. Pain in the hip NEUROLOGIC: No numbness, tingling, or ataxia. No seizure-type activity.  PSYCHIATRIC: No anxiety. No insomnia. No ADD.    Vitals:   Vitals:   04/16/16 0900 04/16/16 1000 04/16/16 1100 04/16/16 1201  BP: 114/63 130/65 123/69 120/75  Pulse: 89 74 75 80  Resp: (!) 24 18 (!) 21   Temp:      TempSrc:      SpO2: 98% 98% 100% 98%  Weight:      Height:        Wt Readings from Last 3 Encounters:  04/15/16 293 lb 3.4 oz (133 kg)  12/23/15 279 lb (126.6 kg)  07/04/15 288 lb 3.2 oz (130.7 kg)     Intake/Output Summary (Last 24 hours) at 04/16/16 1353 Last data filed at 04/16/16 1251  Gross per  24 hour  Intake          2604.42 ml  Output             2521 ml  Net            83.42 ml    Physical Exam:   GENERAL: Appears uncomfortable HEAD, EYES, EARS, NOSE AND THROAT: Atraumatic, normocephalic. Extraocular muscles are intact. Pupils equal and reactive to light. Sclerae anicteric. No conjunctival injection. No oro-pharyngeal erythema.  NECK: Supple. There is no jugular venous distention. No bruits, no lymphadenopathy, no thyromegaly.  HEART: Tachycardic Regular rate and rhythm,. No murmurs, no rubs, no clicks.  LUNGS: Clear to auscultation bilaterally. No rales or rhonchi. No wheezes.  ABDOMEN: Soft, flat, nontender, nondistended. Has good bowel sounds. No hepatosplenomegaly appreciated.  EXTREMITIES: No evidence of any cyanosis, clubbing, or peripheral edema.  +2 pedal and radial pulses bilaterally.  NEUROLOGIC: The patient is alert, awake, and oriented x3 with no focal motor or sensory deficits appreciated bilaterally.  SKIN: Moist and warm with no rashes appreciated.  Psych: Not anxious, depressed LN: No inguinal LN enlargement    Antibiotics   Anti-infectives    Start     Dose/Rate Route Frequency Ordered Stop   04/17/16 1000  cephALEXin (KEFLEX) capsule 500 mg     500 mg Oral Every 12 hours 04/16/16 1103 04/22/16 0959   04/16/16 1000  cefTRIAXone (ROCEPHIN) 1 g in dextrose 5 % 50 mL IVPB  Status:  Discontinued     1 g 100 mL/hr over 30 Minutes Intravenous Every 24 hours 04/16/16 0856 04/16/16 1103   04/16/16 0900  cefTRIAXone (ROCEPHIN) 1 g in dextrose 5 % 50 mL IVPB  Status:  Discontinued     1 g 100 mL/hr over 30 Minutes Intravenous Every 24 hours 04/16/16 0810 04/16/16 0856   04/15/16 1900  vancomycin (VANCOCIN) 1,250 mg in sodium chloride 0.9 % 250 mL IVPB  Status:  Discontinued     1,250 mg 166.7 mL/hr over 90 Minutes Intravenous Every 12 hours 04/15/16 1142 04/16/16 0803   04/15/16 1200  piperacillin-tazobactam (ZOSYN) IVPB 3.375 g  Status:  Discontinued      3.375 g 12.5 mL/hr over 240 Minutes Intravenous Every 8 hours 04/15/16 1135 04/16/16 0803   04/15/16 1145  vancomycin (VANCOCIN) 1,500 mg in sodium chloride 0.9 % 500 mL IVPB     1,500 mg 250 mL/hr over 120 Minutes Intravenous  Once 04/15/16 1134 04/15/16 1537   04/14/16 1315  ceFAZolin (ANCEF) 3 g in dextrose 5 % 50 mL IVPB     3 g 130 mL/hr over 30 Minutes Intravenous Every 6 hours 04/14/16 1303 04/15/16 0043   04/13/16 1729  ceFAZolin (ANCEF) 3 g in dextrose 5 % 50 mL IVPB  Status:  Discontinued     3 g 130 mL/hr over 30 Minutes Intravenous 30 min pre-op 04/13/16 1729 04/14/16 1121      Medications   Scheduled Meds: . amLODipine  5 mg Oral Daily  . apixaban  10 mg Oral BID   Followed by  . [START ON 04/23/2016] apixaban  5 mg Oral BID  . aspirin EC  81 mg Oral Daily  . atorvastatin  40 mg Oral QHS  . [START ON 04/17/2016] cephALEXin  500 mg Oral Q12H  . cholecalciferol  1,000 Units Oral BID  . docusate sodium  100 mg Oral BID  . insulin aspart  0-9 Units Subcutaneous TID WC  . insulin glargine  56 Units Subcutaneous Q2200  . losartan  100 mg Oral Daily  . omega-3 acid ethyl esters  1 g Oral Daily  . pantoprazole  40 mg Oral BID  . sodium chloride flush  10-40 mL Intracatheter Q12H   Continuous Infusions: . sodium chloride 75 mL/hr at 04/16/16 0900   PRN Meds:.acetaminophen **OR** acetaminophen, alum & mag hydroxide-simeth, bisacodyl, diphenhydrAMINE, diphenhydrAMINE, fentaNYL (SUBLIMAZE) injection, ipratropium-albuterol, labetalol, magnesium hydroxide, metoCLOPramide **OR** metoCLOPramide (REGLAN) injection, ondansetron **OR** ondansetron (ZOFRAN) IV, oxyCODONE, prochlorperazine, sodium chloride  flush, sodium phosphate   Data Review:   Micro Results Recent Results (from the past 240 hour(s))  Surgical pcr screen     Status: None   Collection Time: 04/13/16  6:15 PM  Result Value Ref Range Status   MRSA, PCR NEGATIVE NEGATIVE Final   Staphylococcus aureus NEGATIVE  NEGATIVE Final    Comment:        The Xpert SA Assay (FDA approved for NASAL specimens in patients over 66 years of age), is one component of a comprehensive surveillance program.  Test performance has been validated by [facility] for patients greater than or equal to 21 year old. It is not intended to diagnose infection nor to guide or monitor treatment.   MRSA PCR Screening     Status: None   Collection Time: 04/15/16 11:39 AM  Result Value Ref Range Status   MRSA by PCR NEGATIVE NEGATIVE Final    Comment:        The GeneXpert MRSA Assay (FDA approved for NASAL specimens only), is one component of a comprehensive MRSA colonization surveillance program. It is not intended to diagnose MRSA infection nor to guide or monitor treatment for MRSA infections.   CULTURE, BLOOD (ROUTINE X 2) w Reflex to ID Panel     Status: None (Preliminary result)   Collection Time: 04/15/16 12:58 PM  Result Value Ref Range Status   Specimen Description BLOOD RIGHT HAND  Final   Special Requests   Final    BOTTLES DRAWN AEROBIC AND ANAEROBIC  [place]   Culture NO GROWTH < 24 HOURS  Final   Report Status PENDING  Incomplete  CULTURE, BLOOD (ROUTINE X 2) w Reflex to ID Panel     Status: None (Preliminary result)   Collection Time: 04/15/16  1:07 PM  Result Value Ref Range Status   Specimen Description BLOOD LEFT HAND  Final   Special Requests   Final    BOTTLES DRAWN AEROBIC AND ANAEROBIC  [address], [place]   Culture NO GROWTH < 24 HOURS  Final   Report Status PENDING  Incomplete    Radiology Reports Dg Chest 1 View  Result Date: 04/15/2016 CLINICAL DATA:  Postop for hip surgery, shortness of Breath EXAM: CHEST 1 VIEW COMPARISON:  04/13/2016 FINDINGS: Cardiomediastinal silhouette is stable. Mild elevation of the right hemidiaphragm again noted. No acute infiltrate or pleural effusion. No pulmonary edema. IMPRESSION: No active disease. Electronically Signed   By: Lahoma Crocker M.D.   On:  04/15/2016 11:25   Dg Chest 1 View  Result Date: 04/13/2016 CLINICAL DATA:  [place] from standing position landing on the left side. EXAM: CHEST 1 VIEW COMPARISON:  None. FINDINGS: Heart size is at the upper limits of normal. The aorta shows ectasia. The pulmonary vascularity is normal. Lungs are clear. No bone abnormality seen. IMPRESSION: Ectatic aorta.  No active disease. Electronically Signed   By: Nelson Chimes M.D.   On: 04/13/2016 15:57   Ct Head Wo Contrast  Result Date: 04/13/2016 CLINICAL DATA:  Pain following fall EXAM: CT HEAD WITHOUT CONTRAST TECHNIQUE: Contiguous axial images were obtained from the base of the skull through the vertex without intravenous contrast. COMPARISON:  Brain MRI October 12, 2008 FINDINGS: Brain: There is age related volume loss. The ventricles are normal in configuration. There is no intracranial mass, hemorrhage, extra-axial fluid collection, or midline shift. There is minimal small vessel disease in the centra semiovale bilaterally. Elsewhere gray-white compartments appear normal. No acute infarct evident. Vascular: There are no  hyperdense vessels appreciable. There is a small amount of calcification in the left carotid siphon. Skull: The bony calvarium appears intact. There are calcifications along the falx which appear benign. There is a small bony enostosis along the right frontal bone which appears benign. There is no edema in these areas. There is mucosal thickening in several ethmoid air cells bilaterally. There is somewhat ill-defined opacity in the superior right sphenoid sinus as well as in a posterior right ethmoid air cell. Other visualized paranasal sinuses are clear. Visualized orbits appear symmetric bilaterally. Other: Visualized mastoid air cells are clear. IMPRESSION: Minimal periventricular small vessel disease. No intracranial mass, hemorrhage, or extra-axial fluid collection. No acute infarct evident. There are areas of paranasal sinus disease. There is a  rather minimal amount of calcification in the left carotid siphon. Electronically Signed   By: Lowella Grip III M.D.   On: 04/13/2016 15:18   Ct Angio Chest Pe W Or Wo Contrast  Result Date: 04/15/2016 CLINICAL DATA:  Dyspnea, tachycardia. EXAM: CT ANGIOGRAPHY CHEST WITH CONTRAST TECHNIQUE: Multidetector CT imaging of the chest was performed using the standard protocol during bolus administration of intravenous contrast. Multiplanar CT image reconstructions and MIPs were obtained to evaluate the vascular anatomy. CONTRAST:  75 mL of Isovue 370 intravenously. COMPARISON:  Radiograph of same day. FINDINGS: No pneumothorax or pleural effusion is noted. Mild bilateral posterior basilar subsegmental atelectasis is noted. Filling defects are noted in upper and lower lobe branches of the left pulmonary artery consistent with pulmonary emboli. 4.4 cm ascending thoracic aortic aneurysm is noted. No dissection is noted. Great vessels are widely patent. Visualized portion of upper abdomen is unremarkable. No mediastinal mass or adenopathy is noted. Anterior osteophyte formation is seen throughout the thoracic spine. Review of the MIP images confirms the above findings. IMPRESSION: 4.4 cm ascending thoracic aortic aneurysm. Recommend annual imaging followup by CTA or MRA. This recommendation follows 2010 ACCF/AHA/AATS/ACR/ASA/SCA/SCAI/SIR/STS/SVM Guidelines for the Diagnosis and Management of Patients with Thoracic Aortic Disease. Circulation. 2010; 121SP:1689793. Acute pulmonary emboli are seen involving the upper and lower lobe branches of the left pulmonary artery. Critical Value/emergent results were called by telephone at the time of interpretation on 04/15/2016 at 2:55 pm to Dr. Laverle Hobby , who verbally acknowledged these results. Electronically Signed   By: Marijo Conception, M.D.   On: 04/15/2016 14:56   US Venous Img Lower Bilateral  Result Date: 04/16/2016 CLINICAL DATA:  Acute left leg pain and  swelling. EXAM: BILATERAL LOWER EXTREMITY VENOUS DOPPLER ULTRASOUND TECHNIQUE: Gray-scale sonography with graded compression, as well as color Doppler and duplex ultrasound were performed to evaluate the lower extremity deep venous systems from the level of the common femoral vein and including the common femoral, femoral, profunda femoral, popliteal and calf veins including the posterior tibial, peroneal and gastrocnemius veins when visible. The superficial great saphenous vein was also interrogated. Spectral Doppler was utilized to evaluate flow at rest and with distal augmentation maneuvers in the common femoral, femoral and popliteal veins. COMPARISON:  None. FINDINGS: RIGHT LOWER EXTREMITY Common Femoral Vein: No evidence of thrombus. Normal compressibility, respiratory phasicity and response to augmentation. Saphenofemoral Junction: No evidence of thrombus. Normal compressibility and flow on color Doppler imaging. Profunda Femoral Vein: No evidence of thrombus. Normal compressibility and flow on color Doppler imaging. Femoral Vein: No evidence of thrombus. Normal compressibility, respiratory phasicity and response to augmentation. Popliteal Vein: No evidence of thrombus. Normal compressibility, respiratory phasicity and response to augmentation. Calf Veins: No evidence of  thrombus. Normal compressibility and flow on color Doppler imaging. Superficial Great Saphenous Vein: No evidence of thrombus. Normal compressibility and flow on color Doppler imaging. Venous Reflux:  None. Other Findings:  None. LEFT LOWER EXTREMITY Common Femoral Vein: No evidence of thrombus. Normal compressibility, respiratory phasicity and response to augmentation. Saphenofemoral Junction: No evidence of thrombus. Normal compressibility and flow on color Doppler imaging. Profunda Femoral Vein: No evidence of thrombus. Normal compressibility and flow on color Doppler imaging. Femoral Vein: No evidence of thrombus. Normal compressibility,  respiratory phasicity and response to augmentation. Popliteal Vein: No evidence of thrombus. Normal compressibility, respiratory phasicity and response to augmentation. Calf Veins: No evidence of thrombus. Normal compressibility and flow on color Doppler imaging. Superficial Great Saphenous Vein: No evidence of thrombus. Normal compressibility and flow on color Doppler imaging. Venous Reflux:  None. Other Findings:  None. IMPRESSION: No evidence of deep venous thrombosis. Electronically Signed   By: Jerilynn Mages.  Shick M.D.   On: 04/16/2016 11:21   Dg Chest Port 1 View  Result Date: 04/15/2016 CLINICAL DATA:  Evaluate central line placement. EXAM: PORTABLE CHEST 1 VIEW COMPARISON:  April 15, 2016 FINDINGS: A new left central line terminates with the tip near the caval atrial junction, in good position. No pneumothorax. Mild atelectasis in the bases. No other interval changes or acute abnormalities. IMPRESSION: The left central line is in good position with no pneumothorax. Electronically Signed   By: Dorise Bullion III M.D   On: 04/15/2016 13:06   Dg Hip Operative Unilat W Or W/o Pelvis Left  Result Date: 04/14/2016 CLINICAL DATA:  Intra medullary rod fixation of left femur fracture. EXAM: OPERATIVE LEFT HIP (WITH PELVIS IF PERFORMED) 4 VIEWS TECHNIQUE: Fluoroscopic spot image(s) were submitted for interpretation post-operatively. COMPARISON:  Left hip radiographs - 04/13/2016 FINDINGS: 4 spot intraoperative fluoroscopic images of the right hip and femur are provided for review Images demonstrate the sequela of intra medullary rod fixation of the left femur with dynamic screw fixation of the left femoral neck. The distal end of the femoral rod is transfixed with a single cancellous screw. Overall improved alignment of previously noted intertrochanteric femur fracture with minimal residual displacement. There is a minimal subcutaneous emphysema about the operative site. No radiopaque foreign body. IMPRESSION: Post  intra medullary rod fixation of the left femur and dynamic screw fixation of left femoral neck without evidence of complication. Electronically Signed   By: Sandi Mariscal M.D.   On: 04/14/2016 10:43   Dg Hip Unilat W Or Wo Pelvis 2-3 Views Left  Result Date: 04/13/2016 CLINICAL DATA:  Right status post fall trying to run from bees. Left hip pain. Initial encounter. EXAM: DG HIP (WITH OR WITHOUT PELVIS) 2-3V LEFT COMPARISON:  None. FINDINGS: The patient has an acute left intertrochanteric fracture which is mildly comminuted. The femoral head is located. No other acute abnormality is seen. Lower lumbar spondylosis noted. IMPRESSION: Acute left intertrochanteric fracture. Electronically Signed   By: Inge Rise M.D.   On: 04/13/2016 16:00     CBC  Recent Labs Lab 04/13/16 1621 04/14/16 0444 04/15/16 0555 04/16/16 0417  WBC 10.8* 10.5 12.0* 14.3*  HGB 13.5 12.4* 10.4* 9.5*  HCT 39.7* 35.9* 30.1* 27.4*  PLT 238 219 160 153  MCV 85.6 85.2 85.8 85.1  MCH 29.2 29.3 29.6 29.5  MCHC 34.1 34.5 34.6 34.7  RDW 14.3 14.0 13.8 14.0  LYMPHSABS 1.7  --  1.4  --   MONOABS 0.7  --  0.7  --  EOSABS 0.1  --  0.3  --   BASOSABS 0.0  --  0.0  --     Chemistries   Recent Labs Lab 04/13/16 1621 04/14/16 0444 04/15/16 0555 04/16/16 0417  NA 137 136 137 137  K 3.6 3.4* 4.0 3.6  CL 105 102 107 105  CO2 26 27 26 27   GLUCOSE 183* 163* 142* 131*  BUN 13 12 19 19   CREATININE 0.90 0.85 1.07 0.93  CALCIUM 9.6 9.2 8.6* 8.5*  MG  --   --   --  1.8   ------------------------------------------------------------------------------------------------------------------ estimated creatinine clearance is 105.8 mL/min (by C-G formula based on SCr of 0.93 mg/dL). ------------------------------------------------------------------------------------------------------------------ No results for input(s): HGBA1C in the last 72  hours. ------------------------------------------------------------------------------------------------------------------ No results for input(s): CHOL, HDL, LDLCALC, TRIG, CHOLHDL, LDLDIRECT in the last 72 hours. ------------------------------------------------------------------------------------------------------------------ No results for input(s): TSH, T4TOTAL, T3FREE, THYROIDAB in the last 72 hours.  Invalid input(s): FREET3 ------------------------------------------------------------------------------------------------------------------ No results for input(s): VITAMINB12, FOLATE, FERRITIN, TIBC, IRON, RETICCTPCT in the last 72 hours.  Coagulation profile  Recent Labs Lab 04/13/16 1621 04/15/16 1908  INR 0.98 1.14    No results for input(s): DDIMER in the last 72 hours.  Cardiac Enzymes  Recent Labs Lab 04/15/16 1258 04/15/16 1908 04/16/16 0417  CKMB 1.7  --   --   TROPONINI <0.03 <0.03 <0.03   ------------------------------------------------------------------------------------------------------------------ Invalid input(s): POCBNP    Assessment & Plan  Patient is a 70 year old status post hip fracture   *Pulmonary embolism Continue IV heparin for now due to recent surgery and switch to oral anticoagulation tomorrow  *Urinary tract infection Was on ceftriaxone now changed to Ceftin   *Essential hypertension blood pressure currently low    *Left hip fracture Status post repair    * Diabetes    continue Lantus  *Hyperlipidemia continue atorvastatin  *Hypokalemia Potassium is normal  All the records are reviewed and case discussed with ED provider.     Code Status Orders        Start     Ordered   04/13/16 1757  Full code  Continuous     04/13/16 1756    Code Status History    Date Active Date Inactive Code Status Order ID Comments User Context   This patient has a current code status but no historical code status.            Consults Orthopedics   DVT Prophylaxis  Lovenox   Lab Results  Component Value Date   PLT 153 04/16/2016     Time Spent in minutes 32 minutes spent will transfer this patient off the floorreater than 50% of time spent in care coordination and counseling patient regarding the condition and plan of care.   Dustin Flock M.D on 04/16/2016 at 1:53 PM  Between 7am to 6pm - Pager - [phone]  After 6pm go to [email or web address] - password [email or web address] [place] [facility]   Office  [phone]

## 2016-04-16 NOTE — Consult Note (Addendum)
ANTICOAGULATION CONSULT NOTE - Initial Consult  Pharmacy Consult for heparin drip Indication: pulmonary embolus  No Known Allergies  Patient Measurements: Height: 6' (182.9 cm) Weight: 293 lb 3.4 oz (133 kg) IBW/kg (Calculated) : 77.6 Heparin Dosing Weight: 107.8kg  Vital Signs: Temp: 100.4 F (38 C) (09/10 2000) Temp Source: Oral (09/10 2000) BP: 116/71 (09/10 2300) Pulse Rate: 72 (09/10 2300)  Labs:  Recent Labs  04/13/16 1621 04/14/16 0444 04/15/16 0555 04/15/16 1258 04/15/16 1908 04/15/16 2329  HGB 13.5 12.4* 10.4*  --   --   --   HCT 39.7* 35.9* 30.1*  --   --   --   PLT 238 219 160  --   --   --   APTT  --   --   --   --  114*  --   LABPROT 13.0  --   --   --  14.7  --   INR 0.98  --   --   --  1.14  --   HEPARINUNFRC  --   --   --   --   --  0.45  CREATININE 0.90 0.85 1.07  --   --   --   CKMB  --   --   --  1.7  --   --   TROPONINI  --   --   --  <0.03 <0.03  --     Estimated Creatinine Clearance: 92 mL/min (by C-G formula based on SCr of 1.07 mg/dL).   Medical History: Past Medical History:  Diagnosis Date  . Diabetes mellitus without complication ([place])   . Essential hypertension, benign 12/23/2015  . Hx of tobacco use, presenting hazards to health 12/23/2015   Quit prior to 1997  . Hyperlipidemia 12/23/2015  . Hypertension   . Obesity 12/23/2015    Medications:  Scheduled:  . amLODipine  5 mg Oral Daily  . aspirin EC  81 mg Oral Daily  . atorvastatin  40 mg Oral QHS  . cholecalciferol  1,000 Units Oral BID  . docusate sodium  100 mg Oral BID  . insulin aspart  0-9 Units Subcutaneous TID WC  . insulin glargine  56 Units Subcutaneous Q2200  . ipratropium-albuterol  3 mL Nebulization Q6H  . losartan  100 mg Oral Daily  . omega-3 acid ethyl esters  1 g Oral Daily  . pantoprazole  40 mg Oral BID  . piperacillin-tazobactam (ZOSYN)  IV  3.375 g Intravenous Q8H  . vancomycin  1,250 mg Intravenous Q12H    Assessment: Pt is a 70 year old male  currently in the ICU found to have pulmonary emboli on CT scan. Pharmacy consulted to dose heparin drip. Pt was not on anticoagulation at home, was on Lovenox in hospital, last dose 40mg  at 0800 this AM. Baseline labs ordered as add on.  Goal of Therapy:  Heparin level 0.3-0.7 units/ml Monitor platelets by anticoagulation protocol: Yes   Plan:  Give 6000 units bolus x 1 Start heparin infusion at 1750 units/hr Check anti-Xa level in 6 hours and daily while on heparin Continue to monitor H&H and platelets   9/10 23:30 heparin level 0.45. Continue current regimen. Recheck with AM labs to confirm.  9/11 AM heparin level 0.50. Continue current regimen. Recheck CBC and heparin level tomorrow AM.  Ramond Dial, Pharm.D Clinical Pharmacist  04/16/2016,12:20 AM

## 2016-04-16 NOTE — Progress Notes (Signed)
Clinical Social Worker (CSW) met with patient and his wife [name] to present bed offers. Wife chose [facility]. Kim admissions coordinator at [facility] is aware of accepted bed offer. CSW will continue to follow and assist as needed.   [employer], LCSW [phone]

## 2016-04-16 NOTE — Progress Notes (Signed)
Pt continues to experience N/V despite several doses of Zofran and one dose of Reglan. Hospitalist paged and Dr. Marcille Blanco placed orders for IV Compazine PRN. Will continue to monitor.

## 2016-04-16 NOTE — Progress Notes (Signed)
Report called to receiving nurse on 1A. Central line pulled per order from [facility].

## 2016-04-17 ENCOUNTER — Inpatient Hospital Stay: Payer: Medicare Other

## 2016-04-17 ENCOUNTER — Encounter: Payer: Self-pay | Admitting: Radiology

## 2016-04-17 LAB — CBC
HCT: 25.8 % — ABNORMAL LOW (ref 40.0–52.0)
Hemoglobin: 8.9 g/dL — ABNORMAL LOW (ref 13.0–18.0)
MCH: 29.5 pg (ref 26.0–34.0)
MCHC: 34.6 g/dL (ref 32.0–36.0)
MCV: 85.3 fL (ref 80.0–100.0)
Platelets: 164 10*3/uL (ref 150–440)
RBC: 3.02 MIL/uL — ABNORMAL LOW (ref 4.40–5.90)
RDW: 13.6 % (ref 11.5–14.5)
WBC: 12.5 10*3/uL — ABNORMAL HIGH (ref 3.8–10.6)

## 2016-04-17 LAB — GLUCOSE, CAPILLARY
Glucose-Capillary: 138 mg/dL — ABNORMAL HIGH (ref 65–99)
Glucose-Capillary: 130 mg/dL — ABNORMAL HIGH (ref 65–99)
Glucose-Capillary: 142 mg/dL — ABNORMAL HIGH (ref 65–99)
Glucose-Capillary: 139 mg/dL — ABNORMAL HIGH (ref 65–99)

## 2016-04-17 LAB — BASIC METABOLIC PANEL
Anion gap: 5 (ref 5–15)
BUN: 14 mg/dL (ref 6–20)
CO2: 28 mmol/L (ref 22–32)
Calcium: 9 mg/dL (ref 8.9–10.3)
Chloride: 103 mmol/L (ref 101–111)
Creatinine, Ser: 0.86 mg/dL (ref 0.61–1.24)
GFR calc Af Amer: >60 mL/min (ref >60)
GFR calc non Af Amer: >60 mL/min (ref >60)
Glucose, Bld: 145 mg/dL — ABNORMAL HIGH (ref 65–99)
Potassium: 3.8 mmol/L (ref 3.5–5.1)
Sodium: 136 mmol/L (ref 135–145)

## 2016-04-17 LAB — PROCALCITONIN: Procalcitonin: 4.42 ng/mL

## 2016-04-17 MED ORDER — HYDROMORPHONE HCL 1 MG/ML IJ SOLN: 1.0000 mg | INTRAMUSCULAR | Status: DC | PRN

## 2016-04-17 MED ORDER — FAMOTIDINE IN NACL 20-0.9 MG/50ML-% IV SOLN
20.0000 mg | Freq: Two times a day (BID) | INTRAVENOUS | Status: AC
  Administered 2016-04-17 – 2016-04-18 (×3): 20 mg via INTRAVENOUS
  Filled 2016-04-17 (×4): qty 50

## 2016-04-17 MED ORDER — IOPAMIDOL (ISOVUE-300) INJECTION 61%
100.0000 mL | Freq: Once | INTRAVENOUS | Status: AC | PRN
  Administered 2016-04-17: 100 mL via INTRAVENOUS

## 2016-04-17 MED ORDER — FLEET ENEMA 7-19 GM/118ML RE ENEM
1.0000 | ENEMA | Freq: Once | RECTAL | Status: AC
  Administered 2016-04-17: 1 via RECTAL

## 2016-04-17 MED ORDER — METOCLOPRAMIDE HCL 5 MG/ML IJ SOLN
10.0000 mg | Freq: Three times a day (TID) | INTRAMUSCULAR | Status: DC
  Administered 2016-04-17 – 2016-04-19 (×6): 10 mg via INTRAVENOUS
  Filled 2016-04-17 (×5): qty 2

## 2016-04-17 NOTE — Progress Notes (Signed)
PT Cancellation Note  Patient Details Name: Edgar [name] MRN: [id number] DOB: November 10, 1945   Cancelled Treatment:    Reason Eval/Treat Not Completed: Medical issues which prohibited therapy (Consult received and chart reviewed.  Patient noted with acute PE during hospital stay with anticoag initiated 9/10 at 1702.  Per policy, patient to be on anticoagulation x48 hours prior to initiation of PT.  Will hold at this time and re-attempt next AM)   Lileigh Fahringer H. Owens Shark, PT, DPT, NCS 04/17/16, 11:43 AM [phone]

## 2016-04-17 NOTE — Progress Notes (Signed)
Per MD patient is not stable for D/C today and may be ready tomorrow. Kim admissions coordinator at [facility] is aware of above. Plan is for patient to D/C to [facility] when stable. CSW will continue to follow and assist as needed.   [employer], LCSW [phone]

## 2016-04-17 NOTE — Progress Notes (Signed)
Subjective: 3 Days Post-Op Procedure(s) (LRB): INTRAMEDULLARY (IM) NAIL INTERTROCHANTRIC (Left) Patient reports pain as moderate.   Patient is well but recently diagnosed with PE Plan is to go Skilled nursing facility after hospital stay, pending PT Negative for chest pain and shortness of breath this AM. Fever: no Gastrointestinal:Positive for nausea and vomiting.  Objective: Vital signs in last 24 hours: Temp:  [98.1 F (36.7 C)-99.7 F (37.6 C)] 99.6 F (37.6 C) (09/12 0329) Pulse Rate:  [74-90] 80 (09/12 0329) Resp:  [16-24] 18 (09/12 0329) BP: (98-144)/(63-79) 144/79 (09/12 0329) SpO2:  [93 %-100 %] 94 % (09/12 0329)  Intake/Output from previous day:  Intake/Output Summary (Last 24 hours) at 04/17/16 0722 Last data filed at 04/17/16 0641  Gross per 24 hour  Intake             2447 ml  Output             2700 ml  Net             -253 ml    Intake/Output this shift: No intake/output data recorded.  Labs:  Recent Labs  04/15/16 0555 04/16/16 0417 04/17/16 0505  HGB 10.4* 9.5* 8.9*    Recent Labs  04/16/16 0417 04/17/16 0505  WBC 14.3* 12.5*  RBC 3.22* 3.02*  HCT 27.4* 25.8*  PLT 153 164    Recent Labs  04/16/16 0417 04/17/16 0505  NA 137 136  K 3.6 3.8  CL 105 103  CO2 27 28  BUN 19 14  CREATININE 0.93 0.86  GLUCOSE 131* 145*  CALCIUM 8.5* 9.0    Recent Labs  04/15/16 1908  INR 1.14     EXAM General - Patient is Alert, Appropriate and Oriented Extremity - ABD soft Sensation intact distally Intact pulses distally Dorsiflexion/Plantar flexion intact Incision: scant drainage Dressing/Incision - blood tinged drainage Motor Function - intact, moving foot and toes well on exam.   Lungs clear to auscultation. Bowel distended, decreased BS, tympany across the upper quadrants of the abdomen.  Past Medical History:  Diagnosis Date  . Diabetes mellitus without complication ([place])   . Essential hypertension, benign 12/23/2015  . Hx of  tobacco use, presenting hazards to health 12/23/2015   Quit prior to 1997  . Hyperlipidemia 12/23/2015  . Hypertension   . Obesity 12/23/2015    Assessment/Plan: 3 Days Post-Op Procedure(s) (LRB): INTRAMEDULLARY (IM) NAIL INTERTROCHANTRIC (Left) Principal Problem:   Closed left hip fracture (HCC) Active Problems:   Hip fracture (HCC)   Encounter for central line placement   SOB (shortness of breath)  Estimated body mass index is 39.77 kg/m as calculated from the following:   Height as of this encounter: 6' (1.829 m).   Weight as of this encounter: 133 kg (293 lb 3.4 oz). Advance diet Up with therapy if patient able to safely participate.  Pt recently diagnosed with PE, started on Heparin.  Pharmacy to monitor. Most recent labs reviewed.  WBC 12.5 this AM. Up with PT pending on condition. Pt is still nauseated, and having episodes of emesis.  Would add phenergan to medication regimen, recent KUB demonstrated a nonobstructive gas pattern. Pt will need to have a BM prior to discharge.  DVT Prophylaxis - TED hose and Heparin Weight-Bearing as tolerated to left leg  J. Cameron Proud, PA-C [facility] Orthopaedic Surgery 04/17/2016, 7:22 AM

## 2016-04-17 NOTE — Progress Notes (Signed)
Checked to see if pre-authorization would be needed for non-emergent EMS transport. Per [employer] benefits obtained online through [employer], patient has a [employer] Medicare Advantage PPO policy.  Medicare PPO plans do not require pre-auth for non-emergent ground transports using service codes A0426 or A0428.   

## 2016-04-17 NOTE — Consult Note (Signed)
[place] for apixaban dosing  Indication: pulmonary embolus  Assessment: Pharmacy consulted for apixaban dosing for 70 yo male with PE. Patient initially admitted with hip fracture. Patient received heparin at 1750 units/hr for two days.    Plan:  Heparin drip discontinued and patient started on apixaban 10mg  PO BID x 7 days followed by apixaban 5mg  PO BID.  Continue with current therapy. Will order CBC and SCr for tomorrow.   No Known Allergies  Patient Measurements: Height: 6' (182.9 cm) Weight: 293 lb 3.4 oz (133 kg) IBW/kg (Calculated) : 77.6 Heparin Dosing Weight: 107.8kg  Vital Signs: Temp: 97.9 F (36.6 C) (09/12 0822) Temp Source: Oral (09/12 0822) BP: 139/80 (09/12 0822) Pulse Rate: 86 (09/12 0822)  Labs:  Recent Labs  04/15/16 0555 04/15/16 1258 04/15/16 1908 04/15/16 2329 04/16/16 0417 04/17/16 0505  HGB 10.4*  --   --   --  9.5* 8.9*  HCT 30.1*  --   --   --  27.4* 25.8*  PLT 160  --   --   --  153 164  APTT  --   --  114*  --   --   --   LABPROT  --   --  14.7  --   --   --   INR  --   --  1.14  --   --   --   HEPARINUNFRC  --   --   --  0.45 0.50  --   CREATININE 1.07  --   --   --  0.93 0.86  CKMB  --  1.7  --   --   --   --   TROPONINI  --  <0.03 <0.03  --  <0.03  --     Estimated Creatinine Clearance: 114.4 mL/min (by C-G formula based on SCr of 0.86 mg/dL).   Medical History: Past Medical History:  Diagnosis Date  . Diabetes mellitus without complication ([place])   . Essential hypertension, benign 12/23/2015  . Hx of tobacco use, presenting hazards to health 12/23/2015   Quit prior to 1997  . Hyperlipidemia 12/23/2015  . Hypertension   . Obesity 12/23/2015    Medications:  Scheduled:  . amLODipine  5 mg Oral Daily  . apixaban  10 mg Oral BID   Followed by  . [START ON 04/23/2016] apixaban  5 mg Oral BID  . aspirin EC  81 mg Oral Daily  . atorvastatin  40 mg Oral QHS  . cephALEXin  500 mg Oral Q12H   . cholecalciferol  1,000 Units Oral BID  . docusate sodium  100 mg Oral BID  . insulin aspart  0-9 Units Subcutaneous TID WC  . insulin glargine  56 Units Subcutaneous Q2200  . losartan  100 mg Oral Daily  . omega-3 acid ethyl esters  1 g Oral Daily  . pantoprazole  40 mg Oral BID  . sodium chloride flush  10-40 mL Intracatheter Q12H   Pharmacy will continue to monitor and adjust per consult.   Loree Fee, PharmD Clinical Pharmacist 04/17/2016,9:04 AM

## 2016-04-17 NOTE — Progress Notes (Signed)
[place] [facility]                                                                                                                                                                                            Patient Demographics   Edgar [name], is a 70 y.o. male, DOB - Jun 08, 1946, NQ:[id number]  Admit date - 04/13/2016   Admitting Physician Corky Mull, MD  Outpatient Primary MD for the patient is Ashok Norris, MD   LOS - 4  Subjective:  According nurse patient was very nauseous and was refusing to take his medications. Patient reports that he has not been nauseous and is willing to take his medication. Yesterday went for an abdominal x-ray and he was very upset about how he was moved from his bed to get the x-ray.  Review of Systems:   CONSTITUTIONAL: Positive chills  No fatigue, weakness. No weight gain, no weight loss.  EYES: No blurry or double vision.  ENT: No tinnitus. No postnasal drip. No redness of the oropharynx.  RESPIRATORY: No cough, no wheeze, no hemoptysis. No dyspnea.  CARDIOVASCULAR: No chest pain. No orthopnea. No palpitations. No syncope.  GASTROINTESTINAL: + nausea, no vomiting or diarrhea. No abdominal pain. No melena or hematochezia.  GENITOURINARY: No dysuria or hematuria.  ENDOCRINE: No polyuria or nocturia. No heat or cold intolerance.  HEMATOLOGY: No anemia. No bruising. No bleeding.  INTEGUMENTARY: No rashes. No lesions.  MUSCULOSKELETAL: No arthritis. No swelling. No gout. Pain in the hip NEUROLOGIC: No numbness, tingling, or ataxia. No seizure-type activity.  PSYCHIATRIC: No anxiety. No insomnia. No ADD.    Vitals:   Vitals:   04/17/16 0011 04/17/16 0329 04/17/16 0822 04/17/16 1059  BP: 138/75 (!) 144/79 139/80 131/70  Pulse: 84 80 86 75  Resp: 16 18 18  (!) 21  Temp: 99.1 F (37.3 C) 99.6 F (37.6 C) 97.9 F (36.6 C) 98.6 F (37 C)  TempSrc: Oral Oral Oral   SpO2: 93% 94% 99% 98%  Weight:      Height:         Wt Readings from Last 3 Encounters:  04/15/16 293 lb 3.4 oz (133 kg)  12/23/15 279 lb (126.6 kg)  07/04/15 288 lb 3.2 oz (130.7 kg)     Intake/Output Summary (Last 24 hours) at 04/17/16 1221 Last data filed at 04/17/16 0641  Gross per 24 hour  Intake          1880.75 ml  Output             2500 ml  Net          -  619.25 ml    Physical Exam:   GENERAL: Appears uncomfortable HEAD, EYES, EARS, NOSE AND THROAT: Atraumatic, normocephalic. Extraocular muscles are intact. Pupils equal and reactive to light. Sclerae anicteric. No conjunctival injection. No oro-pharyngeal erythema.  NECK: Supple. There is no jugular venous distention. No bruits, no lymphadenopathy, no thyromegaly.  HEART: Tachycardic Regular rate and rhythm,. No murmurs, no rubs, no clicks.  LUNGS: Clear to auscultation bilaterally. No rales or rhonchi. No wheezes.  ABDOMEN: Soft, flat, nontender, nondistended. Has good bowel sounds. No hepatosplenomegaly appreciated.  EXTREMITIES: No evidence of any cyanosis, clubbing, or peripheral edema.  +2 pedal and radial pulses bilaterally.  NEUROLOGIC: The patient is alert, awake, and oriented x3 with no focal motor or sensory deficits appreciated bilaterally.  SKIN: Moist and warm with no rashes appreciated.  Psych: Not anxious, depressed LN: No inguinal LN enlargement    Antibiotics   Anti-infectives    Start     Dose/Rate Route Frequency Ordered Stop   04/17/16 1000  cephALEXin (KEFLEX) capsule 500 mg     500 mg Oral Every 12 hours 04/16/16 1103 04/22/16 0959   04/16/16 1000  cefTRIAXone (ROCEPHIN) 1 g in dextrose 5 % 50 mL IVPB  Status:  Discontinued     1 g 100 mL/hr over 30 Minutes Intravenous Every 24 hours 04/16/16 0856 04/16/16 1103   04/16/16 0900  cefTRIAXone (ROCEPHIN) 1 g in dextrose 5 % 50 mL IVPB  Status:  Discontinued     1 g 100 mL/hr over 30 Minutes Intravenous Every 24 hours 04/16/16 0810 04/16/16 0856   04/15/16 1900  vancomycin (VANCOCIN) 1,250  mg in sodium chloride 0.9 % 250 mL IVPB  Status:  Discontinued     1,250 mg 166.7 mL/hr over 90 Minutes Intravenous Every 12 hours 04/15/16 1142 04/16/16 0803   04/15/16 1200  piperacillin-tazobactam (ZOSYN) IVPB 3.375 g  Status:  Discontinued     3.375 g 12.5 mL/hr over 240 Minutes Intravenous Every 8 hours 04/15/16 1135 04/16/16 0803   04/15/16 1145  vancomycin (VANCOCIN) 1,500 mg in sodium chloride 0.9 % 500 mL IVPB     1,500 mg 250 mL/hr over 120 Minutes Intravenous  Once 04/15/16 1134 04/15/16 1537   04/14/16 1315  ceFAZolin (ANCEF) 3 g in dextrose 5 % 50 mL IVPB     3 g 130 mL/hr over 30 Minutes Intravenous Every 6 hours 04/14/16 1303 04/15/16 0043   04/13/16 1729  ceFAZolin (ANCEF) 3 g in dextrose 5 % 50 mL IVPB  Status:  Discontinued     3 g 130 mL/hr over 30 Minutes Intravenous 30 min pre-op 04/13/16 1729 04/14/16 1121      Medications   Scheduled Meds: . amLODipine  5 mg Oral Daily  . apixaban  10 mg Oral BID   Followed by  . [START ON 04/23/2016] apixaban  5 mg Oral BID  . aspirin EC  81 mg Oral Daily  . atorvastatin  40 mg Oral QHS  . cephALEXin  500 mg Oral Q12H  . cholecalciferol  1,000 Units Oral BID  . docusate sodium  100 mg Oral BID  . famotidine (PEPCID) IV  20 mg Intravenous Q12H  . insulin aspart  0-9 Units Subcutaneous TID WC  . insulin glargine  56 Units Subcutaneous Q2200  . losartan  100 mg Oral Daily  . metoCLOPramide (REGLAN) injection  10 mg Intravenous Q8H  . omega-3 acid ethyl esters  1 g Oral Daily  . pantoprazole  40 mg Oral BID  .  sodium chloride flush  10-40 mL Intracatheter Q12H   Continuous Infusions: . sodium chloride 50 mL/hr at 04/17/16 1109   PRN Meds:.acetaminophen **OR** acetaminophen, alum & mag hydroxide-simeth, bisacodyl, diphenhydrAMINE, diphenhydrAMINE, HYDROmorphone (DILAUDID) injection, ipratropium-albuterol, labetalol, magnesium hydroxide, metoCLOPramide **OR** metoCLOPramide (REGLAN) injection, ondansetron **OR** ondansetron  (ZOFRAN) IV, oxyCODONE, prochlorperazine, sodium chloride flush, sodium phosphate   Data Review:   Micro Results Recent Results (from the past 240 hour(s))  Surgical pcr screen     Status: None   Collection Time: 04/13/16  6:15 PM  Result Value Ref Range Status   MRSA, PCR NEGATIVE NEGATIVE Final   Staphylococcus aureus NEGATIVE NEGATIVE Final    Comment:        The Xpert SA Assay (FDA approved for NASAL specimens in patients over 90 years of age), is one component of a comprehensive surveillance program.  Test performance has been validated by [facility] for patients greater than or equal to 47 year old. It is not intended to diagnose infection nor to guide or monitor treatment.   MRSA PCR Screening     Status: None   Collection Time: 04/15/16 11:39 AM  Result Value Ref Range Status   MRSA by PCR NEGATIVE NEGATIVE Final    Comment:        The GeneXpert MRSA Assay (FDA approved for NASAL specimens only), is one component of a comprehensive MRSA colonization surveillance program. It is not intended to diagnose MRSA infection nor to guide or monitor treatment for MRSA infections.   CULTURE, BLOOD (ROUTINE X 2) w Reflex to ID Panel     Status: None (Preliminary result)   Collection Time: 04/15/16 12:58 PM  Result Value Ref Range Status   Specimen Description BLOOD RIGHT HAND  Final   Special Requests   Final    BOTTLES DRAWN AEROBIC AND ANAEROBIC  [place]   Culture NO GROWTH 2 DAYS  Final   Report Status PENDING  Incomplete  CULTURE, BLOOD (ROUTINE X 2) w Reflex to ID Panel     Status: None (Preliminary result)   Collection Time: 04/15/16  1:07 PM  Result Value Ref Range Status   Specimen Description BLOOD LEFT HAND  Final   Special Requests   Final    BOTTLES DRAWN AEROBIC AND ANAEROBIC  [id number], [place]   Culture NO GROWTH 2 DAYS  Final   Report Status PENDING  Incomplete    Radiology Reports Dg Chest 1 View  Result Date: 04/15/2016 CLINICAL DATA:   Postop for hip surgery, shortness of Breath EXAM: CHEST 1 VIEW COMPARISON:  04/13/2016 FINDINGS: Cardiomediastinal silhouette is stable. Mild elevation of the right hemidiaphragm again noted. No acute infiltrate or pleural effusion. No pulmonary edema. IMPRESSION: No active disease. Electronically Signed   By: Lahoma Crocker M.D.   On: 04/15/2016 11:25   Dg Chest 1 View  Result Date: 04/13/2016 CLINICAL DATA:  [place] from standing position landing on the left side. EXAM: CHEST 1 VIEW COMPARISON:  None. FINDINGS: Heart size is at the upper limits of normal. The aorta shows ectasia. The pulmonary vascularity is normal. Lungs are clear. No bone abnormality seen. IMPRESSION: Ectatic aorta.  No active disease. Electronically Signed   By: Nelson Chimes M.D.   On: 04/13/2016 15:57   Ct Head Wo Contrast  Result Date: 04/13/2016 CLINICAL DATA:  Pain following fall EXAM: CT HEAD WITHOUT CONTRAST TECHNIQUE: Contiguous axial images were obtained from the base of the skull through the vertex without intravenous contrast. COMPARISON:  Brain MRI October 12, 2008 FINDINGS: Brain: There is age related volume loss. The ventricles are normal in configuration. There is no intracranial mass, hemorrhage, extra-axial fluid collection, or midline shift. There is minimal small vessel disease in the centra semiovale bilaterally. Elsewhere gray-white compartments appear normal. No acute infarct evident. Vascular: There are no hyperdense vessels appreciable. There is a small amount of calcification in the left carotid siphon. Skull: The bony calvarium appears intact. There are calcifications along the falx which appear benign. There is a small bony enostosis along the right frontal bone which appears benign. There is no edema in these areas. There is mucosal thickening in several ethmoid air cells bilaterally. There is somewhat ill-defined opacity in the superior right sphenoid sinus as well as in a posterior right ethmoid air cell. Other  visualized paranasal sinuses are clear. Visualized orbits appear symmetric bilaterally. Other: Visualized mastoid air cells are clear. IMPRESSION: Minimal periventricular small vessel disease. No intracranial mass, hemorrhage, or extra-axial fluid collection. No acute infarct evident. There are areas of paranasal sinus disease. There is a rather minimal amount of calcification in the left carotid siphon. Electronically Signed   By: Lowella Grip III M.D.   On: 04/13/2016 15:18   Ct Angio Chest Pe W Or Wo Contrast  Result Date: 04/15/2016 CLINICAL DATA:  Dyspnea, tachycardia. EXAM: CT ANGIOGRAPHY CHEST WITH CONTRAST TECHNIQUE: Multidetector CT imaging of the chest was performed using the standard protocol during bolus administration of intravenous contrast. Multiplanar CT image reconstructions and MIPs were obtained to evaluate the vascular anatomy. CONTRAST:  75 mL of Isovue 370 intravenously. COMPARISON:  Radiograph of same day. FINDINGS: No pneumothorax or pleural effusion is noted. Mild bilateral posterior basilar subsegmental atelectasis is noted. Filling defects are noted in upper and lower lobe branches of the left pulmonary artery consistent with pulmonary emboli. 4.4 cm ascending thoracic aortic aneurysm is noted. No dissection is noted. Great vessels are widely patent. Visualized portion of upper abdomen is unremarkable. No mediastinal mass or adenopathy is noted. Anterior osteophyte formation is seen throughout the thoracic spine. Review of the MIP images confirms the above findings. IMPRESSION: 4.4 cm ascending thoracic aortic aneurysm. Recommend annual imaging followup by CTA or MRA. This recommendation follows 2010 ACCF/AHA/AATS/ACR/ASA/SCA/SCAI/SIR/STS/SVM Guidelines for the Diagnosis and Management of Patients with Thoracic Aortic Disease. Circulation. 2010; [id number]. Acute pulmonary emboli are seen involving the upper and lower lobe branches of the left pulmonary artery. Critical  Value/emergent results were called by telephone at the time of interpretation on 04/15/2016 at 2:55 pm to Dr. Laverle Hobby , who verbally acknowledged these results. Electronically Signed   By: Marijo Conception, M.D.   On: 04/15/2016 14:56   US Venous Img Lower Bilateral  Result Date: 04/16/2016 CLINICAL DATA:  Acute left leg pain and swelling. EXAM: BILATERAL LOWER EXTREMITY VENOUS DOPPLER ULTRASOUND TECHNIQUE: Gray-scale sonography with graded compression, as well as color Doppler and duplex ultrasound were performed to evaluate the lower extremity deep venous systems from the level of the common femoral vein and including the common femoral, femoral, profunda femoral, popliteal and calf veins including the posterior tibial, peroneal and gastrocnemius veins when visible. The superficial great saphenous vein was also interrogated. Spectral Doppler was utilized to evaluate flow at rest and with distal augmentation maneuvers in the common femoral, femoral and popliteal veins. COMPARISON:  None. FINDINGS: RIGHT LOWER EXTREMITY Common Femoral Vein: No evidence of thrombus. Normal compressibility, respiratory phasicity and response to augmentation. Saphenofemoral Junction: No evidence of thrombus. Normal compressibility and flow  on color Doppler imaging. Profunda Femoral Vein: No evidence of thrombus. Normal compressibility and flow on color Doppler imaging. Femoral Vein: No evidence of thrombus. Normal compressibility, respiratory phasicity and response to augmentation. Popliteal Vein: No evidence of thrombus. Normal compressibility, respiratory phasicity and response to augmentation. Calf Veins: No evidence of thrombus. Normal compressibility and flow on color Doppler imaging. Superficial Great Saphenous Vein: No evidence of thrombus. Normal compressibility and flow on color Doppler imaging. Venous Reflux:  None. Other Findings:  None. LEFT LOWER EXTREMITY Common Femoral Vein: No evidence of thrombus. Normal  compressibility, respiratory phasicity and response to augmentation. Saphenofemoral Junction: No evidence of thrombus. Normal compressibility and flow on color Doppler imaging. Profunda Femoral Vein: No evidence of thrombus. Normal compressibility and flow on color Doppler imaging. Femoral Vein: No evidence of thrombus. Normal compressibility, respiratory phasicity and response to augmentation. Popliteal Vein: No evidence of thrombus. Normal compressibility, respiratory phasicity and response to augmentation. Calf Veins: No evidence of thrombus. Normal compressibility and flow on color Doppler imaging. Superficial Great Saphenous Vein: No evidence of thrombus. Normal compressibility and flow on color Doppler imaging. Venous Reflux:  None. Other Findings:  None. IMPRESSION: No evidence of deep venous thrombosis. Electronically Signed   By: Jerilynn Mages.  Shick M.D.   On: 04/16/2016 11:21   Dg Chest Port 1 View  Result Date: 04/15/2016 CLINICAL DATA:  Evaluate central line placement. EXAM: PORTABLE CHEST 1 VIEW COMPARISON:  April 15, 2016 FINDINGS: A new left central line terminates with the tip near the caval atrial junction, in good position. No pneumothorax. Mild atelectasis in the bases. No other interval changes or acute abnormalities. IMPRESSION: The left central line is in good position with no pneumothorax. Electronically Signed   By: Dorise Bullion III M.D   On: 04/15/2016 13:06   Dg Abd 2 Views  Result Date: 04/16/2016 CLINICAL DATA:  70 year old male with history of emesis since surgery 2 days ago. EXAM: ABDOMEN - 2 VIEW COMPARISON:  No priors. FINDINGS: Gas and stool are noted throughout the colon extending to the level of the distal rectum. No pathologic dilatation of small bowel. No pneumoperitoneum. Orthopedic fixation hardware in the left hip incompletely imaged. Healing intertrochanteric left hip fracture. Skin staples adjacent to the left hip. IMPRESSION: 1. Nonobstructive bowel gas pattern. 2. No  pneumoperitoneum. Electronically Signed   By: Vinnie Langton M.D.   On: 04/16/2016 18:14   Dg Hip Operative Unilat W Or W/o Pelvis Left  Result Date: 04/14/2016 CLINICAL DATA:  Intra medullary rod fixation of left femur fracture. EXAM: OPERATIVE LEFT HIP (WITH PELVIS IF PERFORMED) 4 VIEWS TECHNIQUE: Fluoroscopic spot image(s) were submitted for interpretation post-operatively. COMPARISON:  Left hip radiographs - 04/13/2016 FINDINGS: 4 spot intraoperative fluoroscopic images of the right hip and femur are provided for review Images demonstrate the sequela of intra medullary rod fixation of the left femur with dynamic screw fixation of the left femoral neck. The distal end of the femoral rod is transfixed with a single cancellous screw. Overall improved alignment of previously noted intertrochanteric femur fracture with minimal residual displacement. There is a minimal subcutaneous emphysema about the operative site. No radiopaque foreign body. IMPRESSION: Post intra medullary rod fixation of the left femur and dynamic screw fixation of left femoral neck without evidence of complication. Electronically Signed   By: Sandi Mariscal M.D.   On: 04/14/2016 10:43   Dg Hip Unilat W Or Wo Pelvis 2-3 Views Left  Result Date: 04/13/2016 CLINICAL DATA:  Right status post fall  trying to run from bees. Left hip pain. Initial encounter. EXAM: DG HIP (WITH OR WITHOUT PELVIS) 2-3V LEFT COMPARISON:  None. FINDINGS: The patient has an acute left intertrochanteric fracture which is mildly comminuted. The femoral head is located. No other acute abnormality is seen. Lower lumbar spondylosis noted. IMPRESSION: Acute left intertrochanteric fracture. Electronically Signed   By: Inge Rise M.D.   On: 04/13/2016 16:00     CBC  Recent Labs Lab 04/13/16 1621 04/14/16 0444 04/15/16 0555 04/16/16 0417 04/17/16 0505  WBC 10.8* 10.5 12.0* 14.3* 12.5*  HGB 13.5 12.4* 10.4* 9.5* 8.9*  HCT 39.7* 35.9* 30.1* 27.4* 25.8*  PLT  238 219 160 153 164  MCV 85.6 85.2 85.8 85.1 85.3  MCH 29.2 29.3 29.6 29.5 29.5  MCHC 34.1 34.5 34.6 34.7 34.6  RDW 14.3 14.0 13.8 14.0 13.6  LYMPHSABS 1.7  --  1.4  --   --   MONOABS 0.7  --  0.7  --   --   EOSABS 0.1  --  0.3  --   --   BASOSABS 0.0  --  0.0  --   --     Chemistries   Recent Labs Lab 04/13/16 1621 04/14/16 0444 04/15/16 0555 04/16/16 0417 04/17/16 0505  NA 137 136 137 137 136  K 3.6 3.4* 4.0 3.6 3.8  CL 105 102 107 105 103  CO2 26 27 26 27 28   GLUCOSE 183* 163* 142* 131* 145*  BUN 13 12 19 19 14   CREATININE 0.90 0.85 1.07 0.93 0.86  CALCIUM 9.6 9.2 8.6* 8.5* 9.0  MG  --   --   --  1.8  --    ------------------------------------------------------------------------------------------------------------------ estimated creatinine clearance is 114.4 mL/min (by C-G formula based on SCr of 0.86 mg/dL). ------------------------------------------------------------------------------------------------------------------ No results for input(s): HGBA1C in the last 72 hours. ------------------------------------------------------------------------------------------------------------------ No results for input(s): CHOL, HDL, LDLCALC, TRIG, CHOLHDL, LDLDIRECT in the last 72 hours. ------------------------------------------------------------------------------------------------------------------ No results for input(s): TSH, T4TOTAL, T3FREE, THYROIDAB in the last 72 hours.  Invalid input(s): FREET3 ------------------------------------------------------------------------------------------------------------------ No results for input(s): VITAMINB12, FOLATE, FERRITIN, TIBC, IRON, RETICCTPCT in the last 72 hours.  Coagulation profile  Recent Labs Lab 04/13/16 1621 04/15/16 1908  INR 0.98 1.14    No results for input(s): DDIMER in the last 72 hours.  Cardiac Enzymes  Recent Labs Lab 04/15/16 1258 04/15/16 1908 04/16/16 0417  CKMB 1.7  --   --   TROPONINI <0.03  <0.03 <0.03   ------------------------------------------------------------------------------------------------------------------ Invalid input(s): POCBNP    Assessment & Plan  Patient is a 70 year old status post hip fracture   *Pulmonary embolism Patient was switched to oral anticoagulation by the pulmonologist yesterday Doppler of his lower extremity negative  **Nausea and vomiting Abdominal x-rays negative Continue supportive therapy with antiemetics  *Urinary tract infection Continue oral Ceftin  *Essential hypertension blood pressure Now blood pressure normal blood pressure medications on hold   *Left hip fracture Status post repair  Elevating physical therapy to see tomorrow day health PT today because of the diagnoses of PE  * Diabetes    continue Lantus and sliding scale  *Hyperlipidemia continue atorvastatin  *Hypokalemia Potassium is normal  All the records are reviewed and case discussed with ED provider.     Code Status Orders        Start     Ordered   04/13/16 1757  Full code  Continuous     04/13/16 1756    Code Status History    Date Active Date  Inactive Code Status Order ID Comments User Context   This patient has a current code status but no historical code status.           Consults Orthopedics   DVT Prophylaxis  Lovenox   Lab Results  Component Value Date   PLT 164 04/17/2016     Time Spent in minutes 32 minutes spent will transfer this patient off the floorreater than 50% of time spent in care coordination and counseling patient regarding the condition and plan of care.   Dustin Flock M.D on 04/17/2016 at 12:21 PM  Between 7am to 6pm - Pager - [phone]  After 6pm go to [email or web address] - password [email or web address] [facility]   Office  [phone]

## 2016-04-18 LAB — BASIC METABOLIC PANEL
Anion gap: 5 (ref 5–15)
BUN: 16 mg/dL (ref 6–20)
CO2: 29 mmol/L (ref 22–32)
Calcium: 8.9 mg/dL (ref 8.9–10.3)
Chloride: 102 mmol/L (ref 101–111)
Creatinine, Ser: 0.78 mg/dL (ref 0.61–1.24)
GFR calc Af Amer: >60 mL/min (ref >60)
GFR calc non Af Amer: >60 mL/min (ref >60)
Glucose, Bld: 127 mg/dL — ABNORMAL HIGH (ref 65–99)
Potassium: 3.7 mmol/L (ref 3.5–5.1)
Sodium: 136 mmol/L (ref 135–145)

## 2016-04-18 LAB — CBC
HCT: 26.5 % — ABNORMAL LOW (ref 40.0–52.0)
Hemoglobin: 9.1 g/dL — ABNORMAL LOW (ref 13.0–18.0)
MCH: 28.9 pg (ref 26.0–34.0)
MCHC: 34.2 g/dL (ref 32.0–36.0)
MCV: 84.7 fL (ref 80.0–100.0)
Platelets: 208 10*3/uL (ref 150–440)
RBC: 3.13 MIL/uL — ABNORMAL LOW (ref 4.40–5.90)
RDW: 13.7 % (ref 11.5–14.5)
WBC: 11.5 10*3/uL — ABNORMAL HIGH (ref 3.8–10.6)

## 2016-04-18 LAB — GLUCOSE, CAPILLARY
Glucose-Capillary: 112 mg/dL — ABNORMAL HIGH (ref 65–99)
Glucose-Capillary: 132 mg/dL — ABNORMAL HIGH (ref 65–99)
Glucose-Capillary: 137 mg/dL — ABNORMAL HIGH (ref 65–99)
Glucose-Capillary: 162 mg/dL — ABNORMAL HIGH (ref 65–99)

## 2016-04-18 MED ORDER — FAMOTIDINE 20 MG PO TABS: 20.0000 mg | ORAL_TABLET | Freq: Two times a day (BID) | ORAL | Status: DC

## 2016-04-18 MED ORDER — OXYCODONE-ACETAMINOPHEN 5-325 MG PO TABS: 1.0000 | ORAL_TABLET | Freq: Four times a day (QID) | ORAL | Status: DC | PRN

## 2016-04-18 MED ORDER — PANTOPRAZOLE SODIUM 40 MG PO TBEC
40.0000 mg | DELAYED_RELEASE_TABLET | Freq: Two times a day (BID) | ORAL | Status: DC
  Administered 2016-04-18 – 2016-04-19 (×3): 40 mg via ORAL
  Filled 2016-04-18 (×3): qty 1

## 2016-04-18 MED ORDER — ASPIRIN EC 81 MG PO TBEC
81.0000 mg | DELAYED_RELEASE_TABLET | Freq: Every day | ORAL | Status: DC
  Administered 2016-04-18 – 2016-04-19 (×2): 81 mg via ORAL
  Filled 2016-04-18 (×2): qty 1

## 2016-04-18 MED ORDER — VITAMIN D 1000 UNITS PO TABS
1000.0000 [IU] | ORAL_TABLET | Freq: Two times a day (BID) | ORAL | Status: DC
  Administered 2016-04-18 – 2016-04-19 (×2): 1000 [IU] via ORAL
  Filled 2016-04-18 (×2): qty 1

## 2016-04-18 MED ORDER — OMEGA-3-ACID ETHYL ESTERS 1 G PO CAPS
1.0000 g | ORAL_CAPSULE | Freq: Every day | ORAL | Status: DC
  Administered 2016-04-19: 1 g via ORAL
  Filled 2016-04-18 (×2): qty 1

## 2016-04-18 MED ORDER — MAGNESIUM CITRATE PO SOLN
0.5000 | Freq: Once | ORAL | Status: DC
  Filled 2016-04-18 (×2): qty 296

## 2016-04-18 MED ORDER — FAMOTIDINE IN NACL 20-0.9 MG/50ML-% IV SOLN
20.0000 mg | Freq: Two times a day (BID) | INTRAVENOUS | Status: DC
  Filled 2016-04-18: qty 50

## 2016-04-18 MED ORDER — FAMOTIDINE 20 MG PO TABS
20.0000 mg | ORAL_TABLET | Freq: Two times a day (BID) | ORAL | Status: DC
  Administered 2016-04-18 – 2016-04-19 (×2): 20 mg via ORAL
  Filled 2016-04-18 (×2): qty 1

## 2016-04-18 MED ORDER — ATORVASTATIN CALCIUM 20 MG PO TABS
40.0000 mg | ORAL_TABLET | Freq: Every day | ORAL | Status: DC
  Administered 2016-04-18: 40 mg via ORAL
  Filled 2016-04-18: qty 2

## 2016-04-18 MED ORDER — POLYETHYLENE GLYCOL 3350 17 G PO PACK
17.0000 g | PACK | Freq: Every day | ORAL | Status: DC
  Administered 2016-04-18 – 2016-04-19 (×2): 17 g via ORAL
  Filled 2016-04-18 (×2): qty 1

## 2016-04-18 MED ORDER — LACTULOSE 10 GM/15ML PO SOLN
30.0000 g | Freq: Two times a day (BID) | ORAL | Status: DC
  Administered 2016-04-18 (×2): 30 g via ORAL
  Filled 2016-04-18 (×3): qty 60

## 2016-04-18 MED ORDER — FAMOTIDINE IN NACL 20-0.9 MG/50ML-% IV SOLN
20.0000 mg | Freq: Two times a day (BID) | INTRAVENOUS | Status: DC
  Filled 2016-04-18 (×2): qty 50

## 2016-04-18 NOTE — Progress Notes (Signed)
[place] at [facility]                                                                                                                                                                                            Patient Demographics   Edgar [name], is a 70 y.o. male, DOB - [birth year], [id number]  Admit date - 04/13/2016   Admitting Physician Corky Mull, MD  Outpatient Primary MD for the patient is Ashok Norris, MD   LOS - 5  Subjective:  Patient is nausea is improved. CT scan showed some duodenitis patient has not had a bowel movement  Review of Systems:   CONSTITUTIONAL: Positive chills  No fatigue, weakness. No weight gain, no weight loss.  EYES: No blurry or double vision.  ENT: No tinnitus. No postnasal drip. No redness of the oropharynx.  RESPIRATORY: No cough, no wheeze, no hemoptysis. No dyspnea.  CARDIOVASCULAR: No chest pain. No orthopnea. No palpitations. No syncope.  GASTROINTESTINAL: Improved nausea, no vomiting or diarrhea. No abdominal pain. No melena or hematochezia. Positive constipation GENITOURINARY: No dysuria or hematuria.  ENDOCRINE: No polyuria or nocturia. No heat or cold intolerance.  HEMATOLOGY: No anemia. No bruising. No bleeding.  INTEGUMENTARY: No rashes. No lesions.  MUSCULOSKELETAL: No arthritis. No swelling. No gout. Pain in the hip NEUROLOGIC: No numbness, tingling, or ataxia. No seizure-type activity.  PSYCHIATRIC: No anxiety. No insomnia. No ADD.    Vitals:   Vitals:   04/17/16 1911 04/18/16 0329 04/18/16 0721 04/18/16 0933  BP: (!) 148/73 (!) 150/82 (!) 155/81 (!) 150/80  Pulse: 74 85 79   Resp: 19 19 17    Temp: 98.7 F (37.1 C) 98.1 F (36.7 C) 98.8 F (37.1 C)   TempSrc: Oral Oral Oral   SpO2: 98% 99% 95%   Weight:      Height:        Wt Readings from Last 3 Encounters:  04/15/16 293 lb 3.4 oz (133 kg)  12/23/15 279 lb (126.6 kg)  07/04/15 288 lb 3.2 oz (130.7 kg)     Intake/Output  Summary (Last 24 hours) at 04/18/16 1203 Last data filed at 04/18/16 1115  Gross per 24 hour  Intake          1725.42 ml  Output             1575 ml  Net           150.42 ml    Physical Exam:   GENERAL: Appears uncomfortable HEAD, EYES, EARS, NOSE AND THROAT: Atraumatic, normocephalic. Extraocular muscles are intact. Pupils equal and reactive to light. Sclerae  anicteric. No conjunctival injection. No oro-pharyngeal erythema.  NECK: Supple. There is no jugular venous distention. No bruits, no lymphadenopathy, no thyromegaly.  HEART: Tachycardic Regular rate and rhythm,. No murmurs, no rubs, no clicks.  LUNGS: Clear to auscultation bilaterally. No rales or rhonchi. No wheezes.  ABDOMEN: Soft, flat, nontender, nondistended. Has good bowel sounds. No hepatosplenomegaly appreciated.  EXTREMITIES: No evidence of any cyanosis, clubbing, or peripheral edema.  +2 pedal and radial pulses bilaterally.  NEUROLOGIC: The patient is alert, awake, and oriented x3 with no focal motor or sensory deficits appreciated bilaterally.  SKIN: Moist and warm with no rashes appreciated.  Psych: Not anxious, depressed LN: No inguinal LN enlargement    Antibiotics   Anti-infectives    Start     Dose/Rate Route Frequency Ordered Stop   04/17/16 1000  cephALEXin (KEFLEX) capsule 500 mg  Status:  Discontinued     500 mg Oral Every 12 hours 04/16/16 1103 04/17/16 1431   04/16/16 1000  cefTRIAXone (ROCEPHIN) 1 g in dextrose 5 % 50 mL IVPB  Status:  Discontinued     1 g 100 mL/hr over 30 Minutes Intravenous Every 24 hours 04/16/16 0856 04/16/16 1103   04/16/16 0900  cefTRIAXone (ROCEPHIN) 1 g in dextrose 5 % 50 mL IVPB  Status:  Discontinued     1 g 100 mL/hr over 30 Minutes Intravenous Every 24 hours 04/16/16 0810 04/16/16 0856   04/15/16 1900  vancomycin (VANCOCIN) 1,250 mg in sodium chloride 0.9 % 250 mL IVPB  Status:  Discontinued     1,250 mg 166.7 mL/hr over 90 Minutes Intravenous Every 12 hours 04/15/16  1142 04/16/16 0803   04/15/16 1200  piperacillin-tazobactam (ZOSYN) IVPB 3.375 g  Status:  Discontinued     3.375 g 12.5 mL/hr over 240 Minutes Intravenous Every 8 hours 04/15/16 1135 04/16/16 0803   04/15/16 1145  vancomycin (VANCOCIN) 1,500 mg in sodium chloride 0.9 % 500 mL IVPB     1,500 mg 250 mL/hr over 120 Minutes Intravenous  Once 04/15/16 1134 04/15/16 1537   04/14/16 1315  ceFAZolin (ANCEF) 3 g in dextrose 5 % 50 mL IVPB     3 g 130 mL/hr over 30 Minutes Intravenous Every 6 hours 04/14/16 1303 04/15/16 0043   04/13/16 1729  ceFAZolin (ANCEF) 3 g in dextrose 5 % 50 mL IVPB  Status:  Discontinued     3 g 130 mL/hr over 30 Minutes Intravenous 30 min pre-op 04/13/16 1729 04/14/16 1121      Medications   Scheduled Meds: . amLODipine  5 mg Oral Daily  . apixaban  10 mg Oral BID   Followed by  . [START ON 04/23/2016] apixaban  5 mg Oral BID  . docusate sodium  100 mg Oral BID  . famotidine  20 mg Oral BID  . insulin aspart  0-9 Units Subcutaneous TID WC  . insulin glargine  56 Units Subcutaneous Q2200  . lactulose  30 g Oral BID  . losartan  100 mg Oral Daily  . metoCLOPramide (REGLAN) injection  10 mg Intravenous Q8H  . polyethylene glycol  17 g Oral Daily  . sodium chloride flush  10-40 mL Intracatheter Q12H   Continuous Infusions:   PRN Meds:.acetaminophen **OR** acetaminophen, alum & mag hydroxide-simeth, bisacodyl, diphenhydrAMINE, HYDROmorphone (DILAUDID) injection, ipratropium-albuterol, labetalol, magnesium hydroxide, metoCLOPramide **OR** metoCLOPramide (REGLAN) injection, ondansetron **OR** ondansetron (ZOFRAN) IV, oxyCODONE, prochlorperazine, sodium chloride flush, sodium phosphate   Data Review:   Micro Results Recent Results (from the past 240  hour(s))  Surgical pcr screen     Status: None   Collection Time: 04/13/16  6:15 PM  Result Value Ref Range Status   MRSA, PCR NEGATIVE NEGATIVE Final   Staphylococcus aureus NEGATIVE NEGATIVE Final    Comment:         The Xpert SA Assay (FDA approved for NASAL specimens in patients over 28 years of age), is one component of a comprehensive surveillance program.  Test performance has been validated by [facility] for patients greater than or equal to 86 year old. It is not intended to diagnose infection nor to guide or monitor treatment.   MRSA PCR Screening     Status: None   Collection Time: 04/15/16 11:39 AM  Result Value Ref Range Status   MRSA by PCR NEGATIVE NEGATIVE Final    Comment:        The GeneXpert MRSA Assay (FDA approved for NASAL specimens only), is one component of a comprehensive MRSA colonization surveillance program. It is not intended to diagnose MRSA infection nor to guide or monitor treatment for MRSA infections.   CULTURE, BLOOD (ROUTINE X 2) w Reflex to ID Panel     Status: None (Preliminary result)   Collection Time: 04/15/16 12:58 PM  Result Value Ref Range Status   Specimen Description BLOOD RIGHT HAND  Final   Special Requests   Final    BOTTLES DRAWN AEROBIC AND ANAEROBIC  [place]   Culture NO GROWTH 3 DAYS  Final   Report Status PENDING  Incomplete  CULTURE, BLOOD (ROUTINE X 2) w Reflex to ID Panel     Status: None (Preliminary result)   Collection Time: 04/15/16  1:07 PM  Result Value Ref Range Status   Specimen Description BLOOD LEFT HAND  Final   Special Requests   Final    BOTTLES DRAWN AEROBIC AND ANAEROBIC  [id number], [place]   Culture NO GROWTH 3 DAYS  Final   Report Status PENDING  Incomplete    Radiology Reports Dg Chest 1 View  Result Date: 04/15/2016 CLINICAL DATA:  Postop for hip surgery, shortness of Breath EXAM: CHEST 1 VIEW COMPARISON:  04/13/2016 FINDINGS: Cardiomediastinal silhouette is stable. Mild elevation of the right hemidiaphragm again noted. No acute infiltrate or pleural effusion. No pulmonary edema. IMPRESSION: No active disease. Electronically Signed   By: Lahoma Crocker M.D.   On: 04/15/2016 11:25   Dg Chest 1  View  Result Date: 04/13/2016 CLINICAL DATA:  [place] from standing position landing on the left side. EXAM: CHEST 1 VIEW COMPARISON:  None. FINDINGS: Heart size is at the upper limits of normal. The aorta shows ectasia. The pulmonary vascularity is normal. Lungs are clear. No bone abnormality seen. IMPRESSION: Ectatic aorta.  No active disease. Electronically Signed   By: Nelson Chimes M.D.   On: 04/13/2016 15:57   Ct Head Wo Contrast  Result Date: 04/13/2016 CLINICAL DATA:  Pain following fall EXAM: CT HEAD WITHOUT CONTRAST TECHNIQUE: Contiguous axial images were obtained from the base of the skull through the vertex without intravenous contrast. COMPARISON:  Brain MRI October 12, 2008 FINDINGS: Brain: There is age related volume loss. The ventricles are normal in configuration. There is no intracranial mass, hemorrhage, extra-axial fluid collection, or midline shift. There is minimal small vessel disease in the centra semiovale bilaterally. Elsewhere gray-white compartments appear normal. No acute infarct evident. Vascular: There are no hyperdense vessels appreciable. There is a small amount of calcification in the left carotid siphon. Skull: The bony calvarium  appears intact. There are calcifications along the falx which appear benign. There is a small bony enostosis along the right frontal bone which appears benign. There is no edema in these areas. There is mucosal thickening in several ethmoid air cells bilaterally. There is somewhat ill-defined opacity in the superior right sphenoid sinus as well as in a posterior right ethmoid air cell. Other visualized paranasal sinuses are clear. Visualized orbits appear symmetric bilaterally. Other: Visualized mastoid air cells are clear. IMPRESSION: Minimal periventricular small vessel disease. No intracranial mass, hemorrhage, or extra-axial fluid collection. No acute infarct evident. There are areas of paranasal sinus disease. There is a rather minimal amount of  calcification in the left carotid siphon. Electronically Signed   By: Lowella Grip III M.D.   On: 04/13/2016 15:18   Ct Angio Chest Pe W Or Wo Contrast  Result Date: 04/15/2016 CLINICAL DATA:  Dyspnea, tachycardia. EXAM: CT ANGIOGRAPHY CHEST WITH CONTRAST TECHNIQUE: Multidetector CT imaging of the chest was performed using the standard protocol during bolus administration of intravenous contrast. Multiplanar CT image reconstructions and MIPs were obtained to evaluate the vascular anatomy. CONTRAST:  75 mL of Isovue 370 intravenously. COMPARISON:  Radiograph of same day. FINDINGS: No pneumothorax or pleural effusion is noted. Mild bilateral posterior basilar subsegmental atelectasis is noted. Filling defects are noted in upper and lower lobe branches of the left pulmonary artery consistent with pulmonary emboli. 4.4 cm ascending thoracic aortic aneurysm is noted. No dissection is noted. Great vessels are widely patent. Visualized portion of upper abdomen is unremarkable. No mediastinal mass or adenopathy is noted. Anterior osteophyte formation is seen throughout the thoracic spine. Review of the MIP images confirms the above findings. IMPRESSION: 4.4 cm ascending thoracic aortic aneurysm. Recommend annual imaging followup by CTA or MRA. This recommendation follows 2010 ACCF/AHA/AATS/ACR/ASA/SCA/SCAI/SIR/STS/SVM Guidelines for the Diagnosis and Management of Patients with Thoracic Aortic Disease. Circulation. 2010; [id number]. Acute pulmonary emboli are seen involving the upper and lower lobe branches of the left pulmonary artery. Critical Value/emergent results were called by telephone at the time of interpretation on 04/15/2016 at 2:55 pm to Dr. Laverle Hobby , who verbally acknowledged these results. Electronically Signed   By: Marijo Conception, M.D.   On: 04/15/2016 14:56   Ct Abdomen Pelvis W Contrast  Result Date: 04/17/2016 CLINICAL DATA:  Generalized abdomen pain with nausea vomiting  starting today. EXAM: CT ABDOMEN AND PELVIS WITH CONTRAST TECHNIQUE: Multidetector CT imaging of the abdomen and pelvis was performed using the standard protocol following bolus administration of intravenous contrast. CONTRAST:  177mL ISOVUE-300 IOPAMIDOL (ISOVUE-300) INJECTION 61% COMPARISON:  October 21, 2014 FINDINGS: Lower chest: There is mild dependent atelectasis of posterior lung bases. Minimal right pleural effusion is noted. Hepatobiliary: There is diffuse low density of the liver without vessel displacement. No focal liver lesion is identified. The gallbladder is normal. Pancreas: Unremarkable. No pancreatic ductal dilatation or surrounding inflammatory changes. Spleen: Normal in size without focal abnormality. Adrenals/Urinary Tract: The adrenal glands are normal. There is chronic bilateral perinephric stranding. There is no hydronephrosis bilaterally. No focal renal lesion is identified. The bladder is normal. Stomach/Bowel: There is mild stranding surrounding the duodenum. There is no bowel obstruction. The stomach is normal. Colon is normal. The appendix is normal. Vascular/Lymphatic: Aortic atherosclerosis. No enlarged abdominal or pelvic lymph nodes. Reproductive: Prostate is unremarkable. Other: No abdominal wall hernia or abnormality. No abdominopelvic ascites. Musculoskeletal: No acute or significant osseous findings. Prior fixation of the left hip is noted. Degenerative joint changes  of the spine are noted. IMPRESSION: Mild stranding surrounding duodenum consistent with duodenitis. Fatty infiltration of liver. Minimal right pleural effusion. Electronically Signed   By: Abelardo Diesel M.D.   On: 04/17/2016 15:49   US Venous Img Lower Bilateral  Result Date: 04/16/2016 CLINICAL DATA:  Acute left leg pain and swelling. EXAM: BILATERAL LOWER EXTREMITY VENOUS DOPPLER ULTRASOUND TECHNIQUE: Gray-scale sonography with graded compression, as well as color Doppler and duplex ultrasound were performed to  evaluate the lower extremity deep venous systems from the level of the common femoral vein and including the common femoral, femoral, profunda femoral, popliteal and calf veins including the posterior tibial, peroneal and gastrocnemius veins when visible. The superficial great saphenous vein was also interrogated. Spectral Doppler was utilized to evaluate flow at rest and with distal augmentation maneuvers in the common femoral, femoral and popliteal veins. COMPARISON:  None. FINDINGS: RIGHT LOWER EXTREMITY Common Femoral Vein: No evidence of thrombus. Normal compressibility, respiratory phasicity and response to augmentation. Saphenofemoral Junction: No evidence of thrombus. Normal compressibility and flow on color Doppler imaging. Profunda Femoral Vein: No evidence of thrombus. Normal compressibility and flow on color Doppler imaging. Femoral Vein: No evidence of thrombus. Normal compressibility, respiratory phasicity and response to augmentation. Popliteal Vein: No evidence of thrombus. Normal compressibility, respiratory phasicity and response to augmentation. Calf Veins: No evidence of thrombus. Normal compressibility and flow on color Doppler imaging. Superficial Great Saphenous Vein: No evidence of thrombus. Normal compressibility and flow on color Doppler imaging. Venous Reflux:  None. Other Findings:  None. LEFT LOWER EXTREMITY Common Femoral Vein: No evidence of thrombus. Normal compressibility, respiratory phasicity and response to augmentation. Saphenofemoral Junction: No evidence of thrombus. Normal compressibility and flow on color Doppler imaging. Profunda Femoral Vein: No evidence of thrombus. Normal compressibility and flow on color Doppler imaging. Femoral Vein: No evidence of thrombus. Normal compressibility, respiratory phasicity and response to augmentation. Popliteal Vein: No evidence of thrombus. Normal compressibility, respiratory phasicity and response to augmentation. Calf Veins: No  evidence of thrombus. Normal compressibility and flow on color Doppler imaging. Superficial Great Saphenous Vein: No evidence of thrombus. Normal compressibility and flow on color Doppler imaging. Venous Reflux:  None. Other Findings:  None. IMPRESSION: No evidence of deep venous thrombosis. Electronically Signed   By: Jerilynn Mages.  Shick M.D.   On: 04/16/2016 11:21   Dg Chest Port 1 View  Result Date: 04/15/2016 CLINICAL DATA:  Evaluate central line placement. EXAM: PORTABLE CHEST 1 VIEW COMPARISON:  April 15, 2016 FINDINGS: A new left central line terminates with the tip near the caval atrial junction, in good position. No pneumothorax. Mild atelectasis in the bases. No other interval changes or acute abnormalities. IMPRESSION: The left central line is in good position with no pneumothorax. Electronically Signed   By: Dorise Bullion III M.D   On: 04/15/2016 13:06   Dg Abd 2 Views  Result Date: 04/16/2016 CLINICAL DATA:  70 year old male with history of emesis since surgery 2 days ago. EXAM: ABDOMEN - 2 VIEW COMPARISON:  No priors. FINDINGS: Gas and stool are noted throughout the colon extending to the level of the distal rectum. No pathologic dilatation of small bowel. No pneumoperitoneum. Orthopedic fixation hardware in the left hip incompletely imaged. Healing intertrochanteric left hip fracture. Skin staples adjacent to the left hip. IMPRESSION: 1. Nonobstructive bowel gas pattern. 2. No pneumoperitoneum. Electronically Signed   By: Vinnie Langton M.D.   On: 04/16/2016 18:14   Dg Hip Operative Unilat W Or W/o Pelvis Left  Result Date: 04/14/2016 CLINICAL DATA:  Intra medullary rod fixation of left femur fracture. EXAM: OPERATIVE LEFT HIP (WITH PELVIS IF PERFORMED) 4 VIEWS TECHNIQUE: Fluoroscopic spot image(s) were submitted for interpretation post-operatively. COMPARISON:  Left hip radiographs - 04/13/2016 FINDINGS: 4 spot intraoperative fluoroscopic images of the right hip and femur are provided for  review Images demonstrate the sequela of intra medullary rod fixation of the left femur with dynamic screw fixation of the left femoral neck. The distal end of the femoral rod is transfixed with a single cancellous screw. Overall improved alignment of previously noted intertrochanteric femur fracture with minimal residual displacement. There is a minimal subcutaneous emphysema about the operative site. No radiopaque foreign body. IMPRESSION: Post intra medullary rod fixation of the left femur and dynamic screw fixation of left femoral neck without evidence of complication. Electronically Signed   By: Sandi Mariscal M.D.   On: 04/14/2016 10:43   Dg Hip Unilat W Or Wo Pelvis 2-3 Views Left  Result Date: 04/13/2016 CLINICAL DATA:  Right status post fall trying to run from bees. Left hip pain. Initial encounter. EXAM: DG HIP (WITH OR WITHOUT PELVIS) 2-3V LEFT COMPARISON:  None. FINDINGS: The patient has an acute left intertrochanteric fracture which is mildly comminuted. The femoral head is located. No other acute abnormality is seen. Lower lumbar spondylosis noted. IMPRESSION: Acute left intertrochanteric fracture. Electronically Signed   By: Inge Rise M.D.   On: 04/13/2016 16:00     CBC  Recent Labs Lab 04/13/16 1621 04/14/16 0444 04/15/16 0555 04/16/16 0417 04/17/16 0505 04/18/16 0255  WBC 10.8* 10.5 12.0* 14.3* 12.5* 11.5*  HGB 13.5 12.4* 10.4* 9.5* 8.9* 9.1*  HCT 39.7* 35.9* 30.1* 27.4* 25.8* 26.5*  PLT 238 219 160 153 164 208  MCV 85.6 85.2 85.8 85.1 85.3 84.7  MCH 29.2 29.3 29.6 29.5 29.5 28.9  MCHC 34.1 34.5 34.6 34.7 34.6 34.2  RDW 14.3 14.0 13.8 14.0 13.6 13.7  LYMPHSABS 1.7  --  1.4  --   --   --   MONOABS 0.7  --  0.7  --   --   --   EOSABS 0.1  --  0.3  --   --   --   BASOSABS 0.0  --  0.0  --   --   --     Chemistries   Recent Labs Lab 04/14/16 0444 04/15/16 0555 04/16/16 0417 04/17/16 0505 04/18/16 0255  NA 136 137 137 136 136  K 3.4* 4.0 3.6 3.8 3.7  CL 102  107 105 103 102  CO2 27 26 27 28 29   GLUCOSE 163* 142* 131* 145* 127*  BUN 12 19 19 14 16   CREATININE 0.85 1.07 0.93 0.86 0.78  CALCIUM 9.2 8.6* 8.5* 9.0 8.9  MG  --   --  1.8  --   --    ------------------------------------------------------------------------------------------------------------------ estimated creatinine clearance is 123 mL/min (by C-G formula based on SCr of 0.78 mg/dL). ------------------------------------------------------------------------------------------------------------------ No results for input(s): HGBA1C in the last 72 hours. ------------------------------------------------------------------------------------------------------------------ No results for input(s): CHOL, HDL, LDLCALC, TRIG, CHOLHDL, LDLDIRECT in the last 72 hours. ------------------------------------------------------------------------------------------------------------------ No results for input(s): TSH, T4TOTAL, T3FREE, THYROIDAB in the last 72 hours.  Invalid input(s): FREET3 ------------------------------------------------------------------------------------------------------------------ No results for input(s): VITAMINB12, FOLATE, FERRITIN, TIBC, IRON, RETICCTPCT in the last 72 hours.  Coagulation profile  Recent Labs Lab 04/13/16 1621 04/15/16 1908  INR 0.98 1.14    No results for input(s): DDIMER in the last 72 hours.  Cardiac Enzymes  Recent  Labs Lab 04/15/16 1258 04/15/16 1908 04/16/16 0417  CKMB 1.7  --   --   TROPONINI <0.03 <0.03 <0.03   ------------------------------------------------------------------------------------------------------------------ Invalid input(s): POCBNP    Assessment & Plan  Patient is a 70 year old status post hip fracture   *Pulmonary embolism Continued therapy with eliquis Doppler of his lower extremity negative  **Nausea and vomiting Due to duodenitis patient was started on IV famotidine which was changed orally by pharmacy    *Constipation Patient received a enema yesterday with no bowel movements I will add MiraLAX and lactulose to current regimen mag citrate half dose  *Urinary tract infection Continue oral Ceftin  *Essential hypertension blood pressure Now blood pressure normal blood pressure medications on hold  *Left hip fracture Status post repair Physical therapy seen the patient will need rehabilitation  * Diabetes    continue Lantus and sliding scale  *Hyperlipidemia continue atorvastatin  *Hypokalemia Potassium is normal  All the records are reviewed and case discussed with ED provider.     Code Status Orders        Start     Ordered   04/13/16 1757  Full code  Continuous     04/13/16 1756    Code Status History    Date Active Date Inactive Code Status Order ID Comments User Context   This patient has a current code status but no historical code status.           Consults Orthopedics   DVT Prophylaxis  Lovenox   Lab Results  Component Value Date   PLT 208 04/18/2016     Time Spent in minutes 25 minutes spent will transfer this patient off the floorreater than 50% of time spent in care coordination and counseling patient regarding the condition and plan of care.   Dustin Flock M.D on 04/18/2016 at 12:03 PM  Between 7am to 6pm - Pager - [phone]  After 6pm go to [email or web address] - password [email or web address] [facility]   Office  [phone]

## 2016-04-18 NOTE — Consult Note (Signed)
[place] for apixaban dosing  Indication: pulmonary embolus  Assessment: Pharmacy consulted for apixaban dosing for 70 yo male with PE. Patient initially admitted with hip fracture. Patient received heparin at 1750 units/hr for two days.    Plan:  Heparin drip discontinued and patient started on apixaban 10mg  PO BID x 7 days followed by apixaban 5mg  PO BID.  Continue with current therapy. Order CBC and SCr for 9/15.   No Known Allergies  Patient Measurements: Height: 6' (182.9 cm) Weight: 293 lb 3.4 oz (133 kg) IBW/kg (Calculated) : 77.6 Heparin Dosing Weight: 107.8kg  Vital Signs: Temp: 98.8 F (37.1 C) (09/13 0721) Temp Source: Oral (09/13 0721) BP: 155/81 (09/13 0721) Pulse Rate: 79 (09/13 0721)  Labs:  Recent Labs  04/15/16 1258 04/15/16 1908 04/15/16 2329  04/16/16 0417 04/17/16 0505 04/18/16 0255  HGB  --   --   --   < > 9.5* 8.9* 9.1*  HCT  --   --   --   --  27.4* 25.8* 26.5*  PLT  --   --   --   --  153 164 208  APTT  --  114*  --   --   --   --   --   LABPROT  --  14.7  --   --   --   --   --   INR  --  1.14  --   --   --   --   --   HEPARINUNFRC  --   --  0.45  --  0.50  --   --   CREATININE  --   --   --   --  0.93 0.86 0.78  CKMB 1.7  --   --   --   --   --   --   TROPONINI <0.03 <0.03  --   --  <0.03  --   --   < > = values in this interval not displayed.  Estimated Creatinine Clearance: 123 mL/min (by C-G formula based on SCr of 0.78 mg/dL).   Medical History: Past Medical History:  Diagnosis Date  . Diabetes mellitus without complication ([place])   . Essential hypertension, benign 12/23/2015  . Hx of tobacco use, presenting hazards to health 12/23/2015   Quit prior to 1997  . Hyperlipidemia 12/23/2015  . Hypertension   . Obesity 12/23/2015    Medications:  Scheduled:  . amLODipine  5 mg Oral Daily  . apixaban  10 mg Oral BID   Followed by  . [START ON 04/23/2016] apixaban  5 mg Oral BID  . docusate  sodium  100 mg Oral BID  . famotidine (PEPCID) IV  20 mg Intravenous Q12H  . insulin aspart  0-9 Units Subcutaneous TID WC  . insulin glargine  56 Units Subcutaneous Q2200  . losartan  100 mg Oral Daily  . metoCLOPramide (REGLAN) injection  10 mg Intravenous Q8H  . sodium chloride flush  10-40 mL Intracatheter Q12H   Pharmacy will continue to monitor and adjust per consult.   Loree Fee, PharmD Clinical Pharmacist 04/18/2016,8:41 AM

## 2016-04-18 NOTE — Progress Notes (Signed)
CONCERNING: IV to Oral Route Change Policy  RECOMMENDATION: This patient is receiving famotidine by the intravenous route.  Based on criteria approved by the Pharmacy and Therapeutics Committee, the intravenous medication(s) is/are being converted to the equivalent oral dose form(s).   DESCRIPTION: These criteria include:  The patient is eating (either orally or via tube) and/or has been taking other orally administered medications for a least 24 hours  The patient has no evidence of active gastrointestinal bleeding or impaired GI absorption (gastrectomy, short bowel, patient on TNA or NPO).  If you have questions about this conversion, please contact the Pharmacy Department  []   [phone] )  [facility] [x]   [phone] )  [facility] []   [phone] )  [facility] []   [phone] )  [facility] []   [phone] )  Frank, [facility] 04/18/2016 9:50 AM

## 2016-04-18 NOTE — Care Management (Signed)
Discharge anticipated 09/14 to [facility]

## 2016-04-18 NOTE — Progress Notes (Signed)
Per MD patient will likely D/C to [facility] tomorrow. [facility] admissions coordinator at [facility] is aware of above. Clinical Social Worker (CSW) will continue to follow and assist as needed.   [employer], LCSW [phone]

## 2016-04-18 NOTE — Progress Notes (Signed)
Subjective: 4 Days Post-Op Procedure(s) (LRB): INTRAMEDULLARY (IM) NAIL INTERTROCHANTRIC (Left) Patient reports pain as moderate.   Patient is well but recently diagnosed with PE Plan is to go Skilled nursing facility after hospital stay, pending PT Negative for chest pain and shortness of breath this AM. Fever: no Gastrointestinal:Positive for nausea and vomiting.  Objective: Vital signs in last 24 hours: Temp:  [98.1 F (36.7 C)-99.4 F (37.4 C)] 98.8 F (37.1 C) (09/13 0721) Pulse Rate:  [74-85] 79 (09/13 0721) Resp:  [17-19] 17 (09/13 0721) BP: (142-155)/(71-82) 150/80 (09/13 0933) SpO2:  [95 %-99 %] 95 % (09/13 0721)  Intake/Output from previous day:  Intake/Output Summary (Last 24 hours) at 04/18/16 1138 Last data filed at 04/18/16 0900  Gross per 24 hour  Intake          1725.42 ml  Output             1450 ml  Net           275.42 ml    Intake/Output this shift: Total I/O In: 120 [P.O.:120] Out: 175 [Urine:175]  Labs:  Recent Labs  04/16/16 0417 04/17/16 0505 04/18/16 0255  HGB 9.5* 8.9* 9.1*    Recent Labs  04/17/16 0505 04/18/16 0255  WBC 12.5* 11.5*  RBC 3.02* 3.13*  HCT 25.8* 26.5*  PLT 164 208    Recent Labs  04/17/16 0505 04/18/16 0255  NA 136 136  K 3.8 3.7  CL 103 102  CO2 28 29  BUN 14 16  CREATININE 0.86 0.78  GLUCOSE 145* 127*  CALCIUM 9.0 8.9    Recent Labs  04/15/16 1908  INR 1.14     EXAM General - Patient is Alert, Appropriate and Oriented Extremity - ABD soft Sensation intact distally Intact pulses distally Dorsiflexion/Plantar flexion intact Incision: scant drainage Dressing/Incision - blood tinged drainage Motor Function - intact, moving foot and toes well on exam.   Lungs clear to auscultation. Bowel distended, decreased BS, tympany across the upper quadrants of the abdomen.  Past Medical History:  Diagnosis Date  . Diabetes mellitus without complication ([place])   . Essential hypertension, benign  12/23/2015  . Hx of tobacco use, presenting hazards to health 12/23/2015   Quit prior to 1997  . Hyperlipidemia 12/23/2015  . Hypertension   . Obesity 12/23/2015    Assessment/Plan: 4 Days Post-Op Procedure(s) (LRB): INTRAMEDULLARY (IM) NAIL INTERTROCHANTRIC (Left) Principal Problem:   Closed left hip fracture (HCC) Active Problems:   Hip fracture (HCC)   Encounter for central line placement   SOB (shortness of breath)  Estimated body mass index is 39.77 kg/m as calculated from the following:   Height as of this encounter: 6' (1.829 m).   Weight as of this encounter: 133 kg (293 lb 3.4 oz). Advance diet Up with therapy if patient able to safely participate.  Pt recently diagnosed with PE, patient is currently on Eliquis. Most recent labs reviewed.  WBC 11.5 this AM. Up with PT. Pt is still nauseated, and having episodes of emesis.  Would add phenergan to medication regimen, recent KUB demonstrated a nonobstructive gas pattern. Pt has had a BM since being admitted.  DVT Prophylaxis - Foot Pumps, TED hose and Eliquis. Weight-Bearing as tolerated to left leg  J. Cameron Proud, PA-C [facility] 04/18/2016, 11:38 AM

## 2016-04-18 NOTE — Progress Notes (Signed)
Physical Therapy Treatment Patient Details Name: Edgar [name] MRN: [id number] DOB: 10/30/1945 Today's Date: 04/18/2016    History of Present Illness Pt is a 70 y.o. male presenting to hospital s/p fall (trying to get away from bees) sustaining acute L intertrochanteric fx 04/13/16.  Pt s/p ORIF 04/14/16.  Pt with rapid response 9/10 and transferred to CCU prior to PT eval being able to be initiated (pt with dyspnea, AMS, tachycardia).  Imaging 04/15/16 showing acute PE.  Pt also found to have UTI.  Pt with emesis after eating (N/V but abdominal x-rays negative).  New PT orders received and pt seen on POD #4 (per PT guidelines for acute PE's).  PMH includes DM, htn, 4.4 cm ascending thoracic aortic aneurysm.    PT Comments    Pt on BSC upon PT arrival. Pt required +2 mod/max assistance with transfer sit to stand and BSC to bed; CNA assisted with pt hygiene. Pt required mod/max cuing in order to stand upright and use B UE for support on the RW so as to improve standing balance. Pt complete 3 more transfers sit to stand +2 mod assist and was limited due to L hip/thigh pain (10/10; RN notified) and fatigue. Pt requires mod cuing for hand placement, foot placement, and trunk position to assist in sit to stand and to control decent into sitting. Ambulation not progressed at this time due to pt fatigue and pain level. Pt able to transfer sit to supine min assist to get L LE onto the bed and then mod verbal and tactile cues to position self properly. Pt will benefit from continued PT in order to improve functional mobility and strengthening.   Follow Up Recommendations  SNF     Equipment Recommendations  Rolling walker with 5" wheels    Recommendations for Other Services       Precautions / Restrictions Precautions Precautions: Fall Restrictions Weight Bearing Restrictions: Yes LLE Weight Bearing: Weight bearing as tolerated    Mobility  Bed Mobility Overal bed mobility: Needs Assistance Bed  Mobility: Sit to Supine     Sit to supine: Min assist   General bed mobility comments: min assist to get L LE onto bed and mod cuing to bend R knee and pushing self laterally and up in the bed (also used trapeze bar)  Transfers Overall transfer level: Needs assistance Equipment used: Rolling walker (2 wheeled) Transfers: Sit to/from Stand Sit to Stand: Mod assist;+2 physical assistance         General transfer comment: x4; vc's required for hand and feet placement; pt initially keeps trunk flexed on standing but able to correct with mod cuing; increased time to perform; pt limits WB on L LE (L knee maintained in flexion)  Ambulation/Gait Ambulation/Gait assistance: +2 physical assistance;Mod assist;Max assist Ambulation Distance (Feet): 3 Feet (backward steps from BSC to EOB) Assistive device: Rolling walker (2 wheeled) Gait Pattern/deviations: Decreased step length - right;Antalgic;Decreased weight shift to left Gait velocity: decreased   General Gait Details: distance limited to pt pain; max cuing to stand upright and proper body position prior to stand to sit transfer   Stairs            Wheelchair Mobility    Modified Rankin (Stroke Patients Only)       Balance Overall balance assessment: Needs assistance Sitting-balance support: Bilateral upper extremity supported;Feet supported Sitting balance-Leahy Scale: Fair     Standing balance support: Bilateral upper extremity supported (On RW) Standing balance-Leahy Scale: Fair Standing balance  comment: anterior LOB due to flexed posture; mod cuing and +2 min assist to correct; once upright in static standing no LOB             High level balance activites: Backward walking High Level Balance Comments: +2 mod assist with max cuing for safety and use of B UE on RW for support    Cognition Arousal/Alertness: Awake/alert Behavior During Therapy: WFL for tasks assessed/performed Overall Cognitive Status: Within  Functional Limits for tasks assessed                      Exercises     General Comments General comments (skin integrity, edema, etc.): Pt on BSC upon PT arrival; CNA assisted pt with hygiene. Pt agreeable to PT session. Pt vitals monitored and WNLs throughout session.       Pertinent Vitals/Pain Pain Assessment: 0-10 Pain Score: 10-Worst pain ever (8/10 at rest; 10/10 at end of session; RN notified) Pain Location: L hip/thigh Pain Descriptors / Indicators: Pounding Pain Intervention(s): Limited activity within patient's tolerance;Monitored during session;Patient requesting pain meds-RN notified;Repositioned    Home Living Family/patient expects to be discharged to:: Private residence Living Arrangements: Spouse/significant other;Children (Wife and daughter's) Available Help at Discharge: Family   Home Access: Level entry   Home Layout: One level Home Equipment: None      Prior Function Level of Independence: Independent      Comments: Pt denies any falls in past 6 months.  Reports being active.   PT Goals (current goals can now be found in the care plan section) Acute Rehab PT Goals Patient Stated Goal: To decrease pain and to be more well.  PT Goal Formulation: With patient Time For Goal Achievement: 05/02/16 Potential to Achieve Goals: Good Progress towards PT goals: Not progressing toward goals - comment (pt limited by fatigue and pain this P.M. )    Frequency  BID    PT Plan Current plan remains appropriate    Co-evaluation             End of Session Equipment Utilized During Treatment: Gait belt Activity Tolerance: Patient limited by pain;Patient limited by fatigue Patient left: in bed;with call bell/phone within reach;with bed alarm set;with SCD's reapplied (foot pumps applied and activated; B towel rolls to elevate B ankles)     Time: [id number] PT Time Calculation (min) (ACUTE ONLY): 27 min  Charges:                      G CodesRiley Nearing, SPT 04/18/2016, 3:07 PM

## 2016-04-18 NOTE — Evaluation (Signed)
Physical Therapy Evaluation Patient Details Name: Edgar [name] MRN: [id number] DOB: Dec 12, 1945 Today's Date: 04/18/2016   History of Present Illness  Pt is a 70 y.o. male presenting to hospital s/p fall (trying to get away from bees) sustaining acute L intertrochanteric fx 04/13/16.  Pt s/p ORIF 04/14/16.  Pt with rapid response 9/10 and transferred to CCU prior to PT eval being able to be initiated (pt with dyspnea, AMS, tachycardia).  Imaging 04/15/16 showing acute PE.  Pt also found to have UTI.  Pt with emesis after eating (N/V but abdominal x-rays negative).  New PT orders received and pt seen on POD #4 (per PT guidelines for acute PE's).  PMH includes DM, htn, 4.4 cm ascending thoracic aortic aneurysm.  Clinical Impression  Prior to admission, pt was independent ambulating without AD.  Pt lives with his wife and daughter's in 1 level home with level entry.  Currently pt is min to mod assist x1 supine to sit; min to mod assist x2 to stand with RW; and min assist x2 to ambulate 3 feet bed to chair with RW (limited d/t fatigue and L hip pain).  Pt would benefit from skilled PT to address noted impairments and functional limitations.  Recommend pt discharge to STR when medically appropriate.    Follow Up Recommendations SNF    Equipment Recommendations  Rolling walker with 5" wheels    Recommendations for Other Services       Precautions / Restrictions Precautions Precautions: Fall Restrictions Weight Bearing Restrictions: Yes LLE Weight Bearing: Weight bearing as tolerated      Mobility  Bed Mobility Overal bed mobility: Needs Assistance Bed Mobility: Supine to Sit     Supine to sit: Min assist;Mod assist;HOB elevated     General bed mobility comments: vc's for technique required and heavy use of bedrail noted; assist for L LE and to scoot to edge of bed  Transfers Overall transfer level: Needs assistance Equipment used: Rolling walker (2 wheeled) Transfers: Sit to/from  Stand Sit to Stand: Min assist;Mod assist;+2 physical assistance         General transfer comment: vc's required for hand and feet placement; assist to initiate stand and come to full upright required; increased time to perform  Ambulation/Gait Ambulation/Gait assistance: Min assist;+2 physical assistance Ambulation Distance (Feet): 3 Feet (bed to chair) Assistive device: Rolling walker (2 wheeled)   Gait velocity: decreased   General Gait Details: antalgic; decreased stance time L LE; vc's required for walker use and gait pattern; limited distance d/t fatigue and pain  Stairs            Wheelchair Mobility    Modified Rankin (Stroke Patients Only)       Balance Overall balance assessment: Needs assistance Sitting-balance support: Bilateral upper extremity supported;Feet supported Sitting balance-Leahy Scale: Fair     Standing balance support: Bilateral upper extremity supported (on RW) Standing balance-Leahy Scale: Fair Standing balance comment: static standing (once assisted into full upright)                             Pertinent Vitals/Pain Pain Assessment: 0-10 Pain Score: 5  (3/10 at rest; 5/10 with activity and end of session) Pain Location: L hip/thigh Pain Descriptors / Indicators: Sore;Tender Pain Intervention(s): Limited activity within patient's tolerance;Monitored during session;Repositioned (RN notified of pt's pain levels)  HR 79-99 bpm and O2 >90% on room air throughout session (O2 92% end of session).  Home Living Family/patient expects to be discharged to:: Private residence Living Arrangements: Spouse/significant other;Children (Wife and daughter's) Available Help at Discharge: Family   Home Access: Level entry     Home Layout: One level [place]: None      Prior Function Level of Independence: Independent         Comments: Pt denies any falls in past 6 months.  Reports being active.     Hand Dominance         Extremity/Trunk Assessment   Upper Extremity Assessment: Overall WFL for tasks assessed           Lower Extremity Assessment: RLE deficits/detail;LLE deficits/detail RLE Deficits / Details: strength at least 3+/5 hip flexion, knee flexion/extension, and DF LLE Deficits / Details: L hip flexion at least 2+/5 (limited d/t pain); L knee flexion/extension at least 2+/5; DF at least 3+/5  Cervical / Trunk Assessment: Normal  Communication   Communication: HOH  Cognition Arousal/Alertness: Awake/alert Behavior During Therapy: WFL for tasks assessed/performed Overall Cognitive Status: Within Functional Limits for tasks assessed                      General Comments General comments (skin integrity, edema, etc.): Mild drainage noted lateral superior L hip dressing  Nursing cleared pt for participation in physical therapy.  Pt agreeable to PT session.  Pt verbose during session requiring intermittent re-direction.    Exercises General Exercises - Lower Extremity Ankle Circles/Pumps: AROM;Strengthening;Both;10 reps;Supine Quad Sets: AROM;Strengthening;Both;10 reps;Supine Short Arc Quad: Strengthening;Both;10 reps;Supine (AROM R; AAROM L) Heel Slides: Strengthening;Both;10 reps;Supine (AROM R; AAROM L) Hip ABduction/ADduction: Strengthening;Both;10 reps;Supine (AROM R; AAROM L)  Pt required vc's and tactile cues for correct technique with ex's.      Assessment/Plan    PT Assessment Patient needs continued PT services  PT Diagnosis Difficulty walking;Generalized weakness;Acute pain   PT Problem List Decreased strength;Decreased activity tolerance;Decreased balance;Decreased mobility;Decreased knowledge of use of DME;Decreased knowledge of precautions;Pain  PT Treatment Interventions DME instruction;Gait training;Functional mobility training;Therapeutic activities;Therapeutic exercise;Balance training;Patient/family education   PT Goals (Current goals can be found in the  Care Plan section) Acute Rehab PT Goals Patient Stated Goal: to be able to walk again PT Goal Formulation: With patient Time For Goal Achievement: 05/02/16 Potential to Achieve Goals: Good    Frequency BID   Barriers to discharge Decreased caregiver support      Co-evaluation               End of Session Equipment Utilized During Treatment: Gait belt Activity Tolerance: Patient limited by fatigue;Patient limited by pain Patient left: in chair;with call bell/phone within reach;with chair alarm set;with SCD's reapplied (B heels elevated via pillows) Nurse Communication: Mobility status;Precautions;Weight bearing status (Pt's pain level)         Time: [id number] PT Time Calculation (min) (ACUTE ONLY): 45 min   Charges:   PT Evaluation $PT Eval Low Complexity: 1 Procedure PT Treatments $Therapeutic Exercise: 8-22 mins $Therapeutic Activity: 8-22 mins   PT G CodesLeitha Bleak 04-26-16, 12:02 PM Leitha Bleak, [place]

## 2016-04-19 ENCOUNTER — Telehealth: Payer: Self-pay | Admitting: Family Medicine

## 2016-04-19 ENCOUNTER — Encounter
Admission: RE | Admit: 2016-04-19 | Discharge: 2016-04-19 | Disposition: A | Discharge status: Home / Self Care | Payer: Medicare PPO | Source: Ambulatory Visit | Attending: Internal Medicine | Admitting: Internal Medicine

## 2016-04-19 LAB — GLUCOSE, CAPILLARY
Glucose-Capillary: 154 mg/dL — ABNORMAL HIGH (ref 65–99)
Glucose-Capillary: 177 mg/dL — ABNORMAL HIGH (ref 65–99)

## 2016-04-19 MED ORDER — APIXABAN 5 MG PO TABS: 10.0000 mg | ORAL_TABLET | Freq: Two times a day (BID) | ORAL | Status: DC

## 2016-04-19 MED ORDER — PANTOPRAZOLE SODIUM 40 MG PO TBEC: 40.0000 mg | DELAYED_RELEASE_TABLET | Freq: Two times a day (BID) | ORAL | Status: DC

## 2016-04-19 MED ORDER — OXYCODONE-ACETAMINOPHEN 5-325 MG PO TABS: 1.0000 | ORAL_TABLET | Freq: Four times a day (QID) | ORAL | 0 refills | Status: DC | PRN

## 2016-04-19 MED ORDER — LACTULOSE 10 GM/15ML PO SOLN: 30.0000 g | Freq: Two times a day (BID) | ORAL | 0 refills | Status: DC

## 2016-04-19 MED ORDER — BISACODYL 10 MG RE SUPP: 10.0000 mg | Freq: Every day | RECTAL | 0 refills | Status: DC | PRN

## 2016-04-19 MED ORDER — ALUM & MAG HYDROXIDE-SIMETH 200-200-20 MG/5ML PO SUSP: 30.0000 mL | ORAL | 0 refills | Status: DC | PRN

## 2016-04-19 NOTE — Clinical Social Work Placement (Signed)
   CLINICAL SOCIAL WORK PLACEMENT  NOTE  Date:  04/19/2016  Patient Details  Name: Edgar [name] MRN: [id number] Date of Birth: 05/25/1946  Clinical Social Work is seeking post-discharge placement for this patient at the [place] level of care (*CSW will initial, date and re-position this form in  chart as items are completed):  Yes   Patient/family provided with [facility] Work Department's list of facilities offering this level of care within the geographic area requested by the patient (or if unable, by the patient's family).  Yes   Patient/family informed of their freedom to choose among providers that offer the needed level of care, that participate in Medicare, Medicaid or managed care program needed by the patient, have an available bed and are willing to accept the patient.  Yes   Patient/family informed of [facility]'s ownership interest in [facility] and [facility], as well as of the fact that they are under no obligation to receive care at these facilities.  PASRR submitted to EDS on 04/14/16     PASRR number received on 04/14/16     Existing PASRR number confirmed on       FL2 transmitted to all facilities in geographic area requested by pt/family on 04/14/16     FL2 transmitted to all facilities within larger geographic area on       Patient informed that his/her managed care company has contracts with or will negotiate with certain facilities, including the following:        Yes   Patient/family informed of bed offers received.  Patient chooses bed at  [facility] )     Physician recommends and patient chooses bed at      Patient to be transferred to  [facility] ) on 04/19/16.  Patient to be transferred to facility by  [facility] )     Patient family notified on 04/19/16 of transfer.  Name of family member notified:   (Patient's wife [name] is aware of D/C today. )     PHYSICIAN        Additional Comment:    _______________________________________________ Hani Campusano, Veronia Beets, LCSW 04/19/2016, 9:36 AM

## 2016-04-19 NOTE — Progress Notes (Signed)
Patient is medically stable for D/C to [facility] today. Per [place] admissions coordinator at [facility] patient will go to room 201-A. RN will call report at [phone] and arrange EMS for transport. Clinical Education officer, museum (CSW) sent D/C orders to [employer] via [employer]. Patient is aware of above. CSW contacted patient's wife [name] and made her aware of above. Please reconsult if future social work needs arise. CSW signing off.   [employer], LCSW [phone]

## 2016-04-19 NOTE — Progress Notes (Signed)
Physical Therapy Treatment Patient Details Name: Edgar [name] MRN: [id number] DOB: Mar 17, 1946 Today's Date: 04/19/2016    History of Present Illness Pt is a 70 y.o. male presenting to hospital s/p fall (trying to get away from bees) sustaining acute L intertrochanteric fx 04/13/16.  Pt s/p ORIF 04/14/16.  Pt with rapid response 9/10 and transferred to CCU prior to PT eval being able to be initiated (pt with dyspnea, AMS, tachycardia).  Imaging 04/15/16 showing acute PE.  Pt also found to have UTI.  Pt with emesis after eating (N/V but abdominal x-rays negative).  New PT orders received and pt seen on POD #4 (per PT guidelines for acute PE's).  PMH includes DM, htn, 4.4 cm ascending thoracic aortic aneurysm.    PT Comments    Pt demonstrates decreased strength and WB tolerance on the L LE with activities. Pt throughout session requires mod to max cuing to complete exercises correctly and techniques to increase safe mobility. Pt is +1 min assist with bed mobility; +2 mod assist for transfers and ambulation. Pt ambulated 4 ft; greatly limited due to pain. Pt needs reinforcement with exercise program to assist in strengthening in order to allow greater tolerance with activity on the L LE . The patient will benefit from continued PT to address these impairments and improve functional mobility.    Follow Up Recommendations  SNF     Equipment Recommendations  Rolling walker with 5" wheels    Recommendations for Other Services       Precautions / Restrictions Precautions Precautions: Fall Restrictions Weight Bearing Restrictions: Yes LLE Weight Bearing: Weight bearing as tolerated    Mobility  Bed Mobility Overal bed mobility: Needs Assistance Bed Mobility: Supine to Sit     Supine to sit: Min assist;HOB elevated (heavy bed rail use with B UE)     General bed mobility comments: Pt required min assist to bring L LE to EOB; mod cuing for hand placement and pushing self into sitting position;  increased time to complete; reports dizziness with transfer; resolved in approximately 2 minutes  Transfers Overall transfer level: Needs assistance Equipment used: Rolling walker (2 wheeled) Transfers: Sit to/from Stand Sit to Stand: +2 physical assistance;Mod assist         General transfer comment: Pt required several attempts; mod cuing for hand placement and trunk positioning to assist in transfer; pt corrected standing posture quickly and with min vc's; +2 min guard for static standing  Ambulation/Gait Ambulation/Gait assistance: Mod assist;+2 physical assistance Ambulation Distance (Feet): 4 Feet Assistive device: Rolling walker (2 wheeled) Gait Pattern/deviations: Step-to pattern;Decreased step length - right;Decreased stance time - left;Decreased weight shift to left;Antalgic;Trunk flexed Gait velocity: decreased   General Gait Details: pt unable to lift L LE to take a step (slides/shuffles L LE forward); verbal pain with WB on the L LE. Pt requires max cuing for stepping pattern, safe hand placement on RW and need to use B UE support on RW to assist with decreasing WB on L LE in stance.   Stairs            Wheelchair Mobility    Modified Rankin (Stroke Patients Only)       Balance Overall balance assessment: Needs assistance Sitting-balance support: Bilateral upper extremity supported;Feet supported Sitting balance-Leahy Scale: Fair     Standing balance support: Bilateral upper extremity supported Standing balance-Leahy Scale: Fair Standing balance comment: no LOB during transfer or static standing; pt improved shifting hips forward to prevent anterior LOB  Cognition Arousal/Alertness: Awake/alert Behavior During Therapy: WFL for tasks assessed/performed Overall Cognitive Status: Within Functional Limits for tasks assessed                      Exercises General Exercises - Lower Extremity Ankle Circles/Pumps:  AROM;Both;15 reps;Strengthening;Supine Short Arc Quad: AROM;Strengthening;Right;10 reps;Supine;Other (comment) (AAROM; L; strengthening; supine; increased time to complete) Heel Slides: Left;AAROM;10 reps;Supine;Strengthening (increased time to complete) Hip ABduction/ADduction: AAROM;Strengthening;Left;10 reps;Supine (increased time; max cuing to increase participation)    General Comments        Pertinent Vitals/Pain Pain Score: 10-Worst pain ever (4/10 at rest; 10/10 with ambulation; 4/10 at end of session) Pain Location: L Hip/thigh Pain Descriptors / Indicators: Sore Pain Intervention(s): Limited activity within patient's tolerance;Monitored during session;Repositioned;Other (comment) (Pt denied need to notify RN for pain meds)    Home Living                      Prior Function            PT Goals (current goals can now be found in the care plan section) Acute Rehab PT Goals Patient Stated Goal: To be more well and mobile.  PT Goal Formulation: With patient Time For Goal Achievement: 05/02/16 Potential to Achieve Goals: Good Progress towards PT goals: Not progressing toward goals - comment (Increased pain limiting activity tolerance)    Frequency  BID    PT Plan Current plan remains appropriate    Co-evaluation             End of Session Equipment Utilized During Treatment: Gait belt Activity Tolerance: Patient limited by pain Patient left: in chair;with call bell/phone within reach;with chair alarm set;with SCD's reapplied (foot pumps (applied and activated); towel roll on L and  pillow under R LE to elevate bil heels)     Time: [id number] PT Time Calculation (min) (ACUTE ONLY): 41 min  Charges:                       G Codes:      Edgar [name] Rachell Cipro, SPT 04/19/2016, 10:15 AM

## 2016-04-19 NOTE — Discharge Instructions (Signed)
Heart healthy and ADA diet. °Activity as tolerated. °

## 2016-04-19 NOTE — Care Management Important Message (Signed)
Important Message  Patient Details  Name: ANOOP BISSETT MRN: [id number] Date of Birth: 1945/12/27   Medicare Important Message Given:  Yes    Alvie Heidelberg, RN 04/19/2016, 10:37 AM

## 2016-04-19 NOTE — Consult Note (Signed)
[facility] for apixaban dosing  Indication: pulmonary embolus  Assessment: Pharmacy consulted for apixaban dosing for 70 yo male with PE. Patient initially admitted with hip fracture. Patient received heparin at 1750 units/hr for two days.    Plan:  Heparin drip discontinued and patient started on apixaban 10mg  PO BID x 7 days followed by apixaban 5mg  PO BID.  Continue with current therapy. Order CBC and SCr for 9/15. Patient may be discharged today.   No Known Allergies  Patient Measurements: Height: 6' (182.9 cm) Weight: 293 lb 3.4 oz (133 kg) IBW/kg (Calculated) : 77.6 Heparin Dosing Weight: 107.8kg  Vital Signs: Temp: 98.2 F (36.8 C) (09/14 0808) Temp Source: Oral (09/14 0808) BP: 131/77 (09/14 0808) Pulse Rate: 81 (09/14 0808)  Labs:  Recent Labs  04/17/16 0505 04/18/16 0255  HGB 8.9* 9.1*  HCT 25.8* 26.5*  PLT 164 208  CREATININE 0.86 0.78    Estimated Creatinine Clearance: 123 mL/min (by C-G formula based on SCr of 0.78 mg/dL).   Medical History: Past Medical History:  Diagnosis Date  . Diabetes mellitus without complication ([place])   . Essential hypertension, benign 12/23/2015  . Hx of tobacco use, presenting hazards to health 12/23/2015   Quit prior to 1997  . Hyperlipidemia 12/23/2015  . Hypertension   . Obesity 12/23/2015    Medications:  Scheduled:  . amLODipine  5 mg Oral Daily  . apixaban  10 mg Oral BID   Followed by  . [START ON 04/23/2016] apixaban  5 mg Oral BID  . aspirin EC  81 mg Oral Daily  . atorvastatin  40 mg Oral q1800  . cholecalciferol  1,000 Units Oral BID  . docusate sodium  100 mg Oral BID  . famotidine  20 mg Oral BID  . insulin aspart  0-9 Units Subcutaneous TID WC  . insulin glargine  56 Units Subcutaneous Q2200  . lactulose  30 g Oral BID  . losartan  100 mg Oral Daily  . magnesium citrate  0.5 Bottle Oral Once  . metoCLOPramide (REGLAN) injection  10 mg Intravenous Q8H  . omega-3  acid ethyl esters  1 g Oral Daily  . pantoprazole  40 mg Oral BID  . polyethylene glycol  17 g Oral Daily  . sodium chloride flush  10-40 mL Intracatheter Q12H   Pharmacy will continue to monitor and adjust per consult.   Loree Fee, PharmD Clinical Pharmacist 04/19/2016,9:37 AM

## 2016-04-19 NOTE — Progress Notes (Signed)
Subjective: 5 Days Post-Op Procedure(s) (LRB): INTRAMEDULLARY (IM) NAIL INTERTROCHANTRIC (Left) Patient reports pain as mild.   Patient is well, and has had no acute complaints or problems.  Recently diagnosed with PE, on Eliquis. Plan is to go Skilled nursing facility after hospital stay. Negative for chest pain and shortness of breath this AM. Fever: no Gastrointestinal:Negative for nausea and vomiting.  Objective: Vital signs in last 24 hours: Temp:  [98.2 F (36.8 C)-99.5 F (37.5 C)] 98.2 F (36.8 C) (09/14 0352) Pulse Rate:  [74-82] 74 (09/14 0352) Resp:  [16-18] 16 (09/14 0352) BP: (132-150)/(63-80) 132/69 (09/14 0352) SpO2:  [93 %-99 %] 99 % (09/14 0352)  Intake/Output from previous day:  Intake/Output Summary (Last 24 hours) at 04/19/16 0729 Last data filed at 04/19/16 0643  Gross per 24 hour  Intake              840 ml  Output             2200 ml  Net            -1360 ml    Intake/Output this shift: No intake/output data recorded.  Labs:  Recent Labs  04/17/16 0505 04/18/16 0255  HGB 8.9* 9.1*    Recent Labs  04/17/16 0505 04/18/16 0255  WBC 12.5* 11.5*  RBC 3.02* 3.13*  HCT 25.8* 26.5*  PLT 164 208    Recent Labs  04/17/16 0505 04/18/16 0255  NA 136 136  K 3.8 3.7  CL 103 102  CO2 28 29  BUN 14 16  CREATININE 0.86 0.78  GLUCOSE 145* 127*  CALCIUM 9.0 8.9   No results for input(s): LABPT, INR in the last 72 hours.   EXAM General - Patient is Alert, Appropriate and Oriented Extremity - ABD soft Sensation intact distally Intact pulses distally Dorsiflexion/Plantar flexion intact Incision: scant drainage Dressing/Incision - blood tinged drainage Motor Function - intact, moving foot and toes well on exam.   Lungs clear to auscultation. Abdomen less distended this AM, less tympany.  Past Medical History:  Diagnosis Date  . Diabetes mellitus without complication ([place])   . Essential hypertension, benign 12/23/2015  . Hx of tobacco  use, presenting hazards to health 12/23/2015   Quit prior to 1997  . Hyperlipidemia 12/23/2015  . Hypertension   . Obesity 12/23/2015   Assessment/Plan: 5 Days Post-Op Procedure(s) (LRB): INTRAMEDULLARY (IM) NAIL INTERTROCHANTRIC (Left) Principal Problem:   Closed left hip fracture (HCC) Active Problems:   Hip fracture (HCC)   Encounter for central line placement   SOB (shortness of breath)  Estimated body mass index is 39.77 kg/m as calculated from the following:   Height as of this encounter: 6' (1.829 m).   Weight as of this encounter: 133 kg (293 lb 3.4 oz). Advance diet Up with therapy   Plan will be for discharge today to SNF pending medical clearance. Pt recently diagnosed with PE, patient is currently on Eliquis. Pt was only able to ambulate 3 feet with PT yesterday, plan will be for discharge to SNF. Pt denies any nausea this AM, recent KUB demonstrated non-obstructive bowel pattern. Staples can be removed by the skilled nursing facility on 04/27/16.   Follow-up with [facility] in 6 weeks.  Follow-up with PCP for continue monitoring of recent PE.  DVT Prophylaxis - Foot Pumps, TED hose and Eliquis. Weight-Bearing as tolerated to left leg  J. Cameron Proud, PA-C [facility] 04/19/2016, 7:29 AM

## 2016-04-19 NOTE — Care Management Note (Signed)
Case Management Note  Patient Details  Name: Edgar [name] MRN: [id number] Date of Birth: 1946-04-22  Subjective/Objective:                    Action/Plan: for facility pPatietn will discharge to [facility] today. CSW is following for facility placement. Will sign off.   Expected Discharge Date:                  Expected Discharge Plan:  Skilled Nursing Facility  In-House Referral:  Clinical Social Work  Discharge planning Services  CM Consult  Post Acute Care Choice:    Choice offered to:     DME Arranged:    DME Agency:     HH Arranged:    HH Agency:     Status of Service:     If discussed at [employer], dates discussed:    Additional Comments:  Alvie Heidelberg, RN 04/19/2016, 9:04 AM

## 2016-04-19 NOTE — Progress Notes (Signed)
Report called to [facility]. EMS called for transport.

## 2016-04-19 NOTE — Progress Notes (Signed)
EMS here to transport pt. 

## 2016-04-19 NOTE — Discharge Summary (Signed)
[facility] NAME: Edgar [name]    MR#:  [id number]  DATE OF BIRTH:  [birth year]  DATE OF ADMISSION:  04/13/2016   ADMITTING PHYSICIAN: Corky Mull, MD  DATE OF DISCHARGE:  04/19/2016 PRIMARY CARE PHYSICIAN: Ashok Norris, MD   ADMISSION DIAGNOSIS:  Hip fracture, left, closed, initial encounter ([place]) [S72.002A] DISCHARGE DIAGNOSIS:  Principal Problem:   Closed left hip fracture ([place]) Active Problems:   Hip fracture ([place])   Encounter for central line placement   SOB (shortness of breath) Left hip fracture Pulmonary embolism duodenitis  Urinary tract infection SECONDARY DIAGNOSIS:   Past Medical History:  Diagnosis Date  . Diabetes mellitus without complication ([place])   . Essential hypertension, benign 12/23/2015  . Hx of tobacco use, presenting hazards to health 12/23/2015   Quit prior to 1997  . Hyperlipidemia 12/23/2015  . Hypertension   . Obesity 12/23/2015   HOSPITAL COURSE:  Patient is a 70 year old status post hip fracture  *Pulmonary embolism,  He was found PE after hip surgery, treated with heparin drip and started eliquis. Continue therapy with eliquis Doppler of his lower extremity negative.  **Nausea and vomiting Due to duodenitis patient was started on IV famotidine which was changed orally by pharmacy   *Constipation had bowel movement yesterday. Treated with  MiraLAX and lactulose to current regimen mag citrate half dose  *Urinary tract infection Treated wtih oral Ceftin  *Essential hypertension blood pressure Now blood pressure normal blood pressure medications on hold. Resume norvasc due to elevated BP.  *Left hip fracture Status post repair Physical therapy: SNF. Pain control.   * Diabetes  continue Lantus and sliding scale  *Hyperlipidemia continue atorvastatin  *Hypokalemia Potassium is normal  DISCHARGE CONDITIONS:  Stable, discharge to SNF today. CONSULTS OBTAINED:    Treatment Team:  Corky Mull, MD Laverle Hobby, MD DRUG ALLERGIES:  No Known Allergies DISCHARGE MEDICATIONS:     Medication List    STOP taking these medications   HYDROcodone-acetaminophen 10-325 MG tablet Commonly known as:  NORCO     TAKE these medications   acetaminophen 500 MG tablet Commonly known as:  TYLENOL Take 500 mg by mouth every 6 (six) hours as needed.   alum & mag hydroxide-simeth 200-200-20 MG/5ML suspension Commonly known as:  MAALOX/MYLANTA Take 30 mLs by mouth every 4 (four) hours as needed for indigestion or heartburn.   amLODipine 5 MG tablet Commonly known as:  NORVASC TAKE 1 TABLET EVERY DAY   apixaban 5 MG Tabs tablet Commonly known as:  ELIQUIS Take 2 tablets (10 mg total) by mouth 2 (two) times daily.   aspirin EC 81 MG tablet Take 81 mg by mouth daily.   atorvastatin 40 MG tablet Commonly known as:  LIPITOR TAKE 1 TABLET BY MOUTH AT BEDTIME   bisacodyl 10 MG suppository Commonly known as:  DULCOLAX Place 1 suppository (10 mg total) rectally daily as needed for moderate constipation.   cholecalciferol 1000 units tablet Commonly known as:  VITAMIN D Take 1,000 Units by mouth 2 (two) times daily.   FISH OIL + D3 1200-1000 MG-UNIT Caps Take 1,200 mg by mouth daily.   insulin regular 100 units/mL injection Commonly known as:  HUMULIN R Inject 0.11 mLs (11 Units total) into the skin 3 (three) times daily before meals. This replaces NOVOLOG (this is short-acting insulin)   lactulose 10 GM/15ML solution Commonly known as:  CHRONULAC Take 45 mLs (30 g total) by mouth 2 (  two) times daily.   LANTUS SOLOSTAR 100 UNIT/ML Solostar Pen Generic drug:  Insulin Glargine INJECT 56 UNITS SUBCUTANEOUSLY ONCE A DAY   losartan 100 MG tablet Commonly known as:  COZAAR Take 1 tablet (100 mg total) by mouth daily.   oxyCODONE-acetaminophen 5-325 MG tablet Commonly known as:  PERCOCET/ROXICET Take 1 tablet by mouth every 6 (six) hours as  needed for moderate pain.   pantoprazole 40 MG tablet Commonly known as:  PROTONIX Take 1 tablet (40 mg total) by mouth 2 (two) times daily.        DISCHARGE INSTRUCTIONS:   DIET:  Heart healthy and ADA diet. DISCHARGE CONDITION:  Stable. ACTIVITY:  As tolerated. DISCHARGE LOCATION:    If you experience worsening of your admission symptoms, develop shortness of breath, life threatening emergency, suicidal or homicidal thoughts you must seek medical attention immediately by calling 911 or calling your MD immediately  if symptoms less severe.  You Must read complete instructions/literature along with all the possible adverse reactions/side effects for all the Medicines you take and that have been prescribed to you. Take any new Medicines after you have completely understood and accpet all the possible adverse reactions/side effects.   Please note  You were cared for by a hospitalist during your hospital stay. If you have any questions about your discharge medications or the care you received while you were in the hospital after you are discharged, you can call the unit and asked to speak with the hospitalist on call if the hospitalist that took care of you is not available. Once you are discharged, your primary care physician will handle any further medical issues. Please note that NO REFILLS for any discharge medications will be authorized once you are discharged, as it is imperative that you return to your primary care physician (or establish a relationship with a primary care physician if you do not have one) for your aftercare needs so that they can reassess your need for medications and monitor your lab values.    On the day of Discharge:  VITAL SIGNS:  Blood pressure 131/77, pulse 81, temperature 98.2 F (36.8 C), temperature source Oral, resp. rate 18, height 6' (1.829 m), weight 293 lb 3.4 oz (133 kg), SpO2 94 %. PHYSICAL EXAMINATION:  GENERAL:  70 y.o.-year-old patient  lying in the bed with no acute distress. Obese. EYES: Pupils equal, round, reactive to light and accommodation. No scleral icterus. Extraocular muscles intact.  HEENT: Head atraumatic, normocephalic. Oropharynx and nasopharynx clear.  NECK:  Supple, no jugular venous distention. No thyroid enlargement, no tenderness.  LUNGS: Normal breath sounds bilaterally, no wheezing, rales,rhonchi or crepitation. No use of accessory muscles of respiration.  CARDIOVASCULAR: S1, S2 normal. No murmurs, rubs, or gallops.  ABDOMEN: Soft, non-tender, non-distended. Bowel sounds present. No organomegaly or mass.  EXTREMITIES: No pedal edema, cyanosis, or clubbing.  NEUROLOGIC: Cranial nerves II through XII are intact. Muscle strength 5/5 in all extremities. Sensation intact. Gait not checked.  PSYCHIATRIC: The patient is alert and oriented x 3.  SKIN: No obvious rash, lesion, or ulcer.  DATA REVIEW:   CBC  Recent Labs Lab 04/18/16 0255  WBC 11.5*  HGB 9.1*  HCT 26.5*  PLT 208    Chemistries   Recent Labs Lab 04/16/16 0417  04/18/16 0255  NA 137  < > 136  K 3.6  < > 3.7  CL 105  < > 102  CO2 27  < > 29  GLUCOSE 131*  < >  127*  BUN 19  < > 16  CREATININE 0.93  < > 0.78  CALCIUM 8.5*  < > 8.9  MG 1.8  --   --   < > = values in this interval not displayed.   Microbiology Results  Results for orders placed or performed during the hospital encounter of 04/13/16  Surgical pcr screen     Status: None   Collection Time: 04/13/16  6:15 PM  Result Value Ref Range Status   MRSA, PCR NEGATIVE NEGATIVE Final   Staphylococcus aureus NEGATIVE NEGATIVE Final    Comment:        The Xpert SA Assay (FDA approved for NASAL specimens in patients over 29 years of age), is one component of a comprehensive surveillance program.  Test performance has been validated by [facility] for patients greater than or equal to 78 year old. It is not intended to diagnose infection nor to guide or monitor  treatment.   MRSA PCR Screening     Status: None   Collection Time: 04/15/16 11:39 AM  Result Value Ref Range Status   MRSA by PCR NEGATIVE NEGATIVE Final    Comment:        The GeneXpert MRSA Assay (FDA approved for NASAL specimens only), is one component of a comprehensive MRSA colonization surveillance program. It is not intended to diagnose MRSA infection nor to guide or monitor treatment for MRSA infections.   CULTURE, BLOOD (ROUTINE X 2) w Reflex to ID Panel     Status: None (Preliminary result)   Collection Time: 04/15/16 12:58 PM  Result Value Ref Range Status   Specimen Description BLOOD RIGHT HAND  Final   Special Requests   Final    BOTTLES DRAWN AEROBIC AND ANAEROBIC  [place]   Culture NO GROWTH 4 DAYS  Final   Report Status PENDING  Incomplete  CULTURE, BLOOD (ROUTINE X 2) w Reflex to ID Panel     Status: None (Preliminary result)   Collection Time: 04/15/16  1:07 PM  Result Value Ref Range Status   Specimen Description BLOOD LEFT HAND  Final   Special Requests   Final    BOTTLES DRAWN AEROBIC AND ANAEROBIC  [address], [place]   Culture NO GROWTH 4 DAYS  Final   Report Status PENDING  Incomplete    RADIOLOGY:  No results found.   Management plans discussed with the patient, family and they are in agreement.  CODE STATUS:     Code Status Orders        Start     Ordered   04/13/16 1757  Full code  Continuous     04/13/16 1756    Code Status History    Date Active Date Inactive Code Status Order ID Comments User Context   This patient has a current code status but no historical code status.      TOTAL TIME TAKING CARE OF THIS PATIENT: 35 minutes.    Demetrios Loll M.D on 04/19/2016 at 8:32 AM  Between 7am to 6pm - Pager - [phone]  After 6pm go to [email or web address] - Proofreader  [facility]  Office  [phone]  CC: Primary care physician; Ashok Norris, MD   Note: This dictation was prepared with  Dragon dictation along with smaller phrase technology. Any transcriptional errors that result from this process are unintentional.

## 2016-04-20 LAB — CULTURE, BLOOD (ROUTINE X 2)
Culture: NO GROWTH
Culture: NO GROWTH

## 2016-04-24 ENCOUNTER — Non-Acute Institutional Stay (SKILLED_NURSING_FACILITY): Payer: Medicare Other | Admitting: Gerontology

## 2016-04-24 DIAGNOSIS — G8918 Other acute postprocedural pain: Secondary | ICD-10-CM

## 2016-04-24 NOTE — Progress Notes (Signed)
Location:      Place of Service:  SNF (31) Provider:  Toni Arthurs, NP-C  Ashok Norris, MD  Patient Care Team: Ashok Norris, MD as PCP - General (Family Medicine) Ashok Norris, MD [facility] Medicine)  Extended Emergency Contact Information Primary Emergency Contact: Gladish,Estell Address: [address]          [place], [place] [address] Johnnette Litter of [place] Phone: [phone] Relation: Spouse  Code Status:  full Goals of care: Advanced Directive information Advanced Directives 04/13/2016  Does patient have an advance directive? No  Would patient like information on creating an advanced directive? No - patient declined information     Chief Complaint  Patient presents with  . Medication Management    HPI:  Pt is a 70 y.o. male seen today for Medication Management of controlled substances used to control pain as a result of L Hip fracture. The patient describes their pain as well controlled on current regimen. Average pain score is 5/10. Other modalities used to control symptoms include ice, rest. Pt has tried lower dose of percocet in the past with unsatisfactory or incomplete relief of symptoms. Past Medical History:  Diagnosis Date  . Diabetes mellitus without complication ([place])   . Essential hypertension, benign 12/23/2015  . Hx of tobacco use, presenting hazards to health 12/23/2015   Quit prior to 1997  . Hyperlipidemia 12/23/2015  . Hypertension   . Obesity 12/23/2015   Past Surgical History:  Procedure Laterality Date  . EYE SURGERY    . INTRAMEDULLARY (IM) NAIL INTERTROCHANTERIC Left 04/14/2016   Procedure: INTRAMEDULLARY (IM) NAIL INTERTROCHANTRIC;  Surgeon: Corky Mull, MD;  Location: [facility] ORS;  Service: Orthopedics;  Laterality: Left;    No Known Allergies    Medication List       Accurate as of 04/24/16  7:28 PM. Always use your most recent med list.          acetaminophen 500 MG tablet Commonly known as:  TYLENOL Take 500 mg by  mouth every 6 (six) hours as needed.   alum & mag hydroxide-simeth 200-200-20 MG/5ML suspension Commonly known as:  MAALOX/MYLANTA Take 30 mLs by mouth every 4 (four) hours as needed for indigestion or heartburn.   amLODipine 5 MG tablet Commonly known as:  NORVASC TAKE 1 TABLET EVERY DAY   apixaban 5 MG Tabs tablet Commonly known as:  ELIQUIS Take 2 tablets (10 mg total) by mouth 2 (two) times daily.   aspirin EC 81 MG tablet Take 81 mg by mouth daily.   atorvastatin 40 MG tablet Commonly known as:  LIPITOR TAKE 1 TABLET BY MOUTH AT BEDTIME   bisacodyl 10 MG suppository Commonly known as:  DULCOLAX Place 1 suppository (10 mg total) rectally daily as needed for moderate constipation.   cholecalciferol 1000 units tablet Commonly known as:  VITAMIN D Take 1,000 Units by mouth 2 (two) times daily.   FISH OIL + D3 1200-1000 MG-UNIT Caps Take 1,200 mg by mouth daily.   insulin regular 100 units/mL injection Commonly known as:  HUMULIN R Inject 0.11 mLs (11 Units total) into the skin 3 (three) times daily before meals. This replaces NOVOLOG (this is short-acting insulin)   lactulose 10 GM/15ML solution Commonly known as:  CHRONULAC Take 45 mLs (30 g total) by mouth 2 (two) times daily.   LANTUS SOLOSTAR 100 UNIT/ML Solostar Pen Generic drug:  Insulin Glargine INJECT 56 UNITS SUBCUTANEOUSLY ONCE A DAY   losartan 100 MG tablet Commonly known as:  COZAAR Take 1 tablet (100 mg total) by mouth daily.   oxyCODONE-acetaminophen 5-325 MG tablet Commonly known as:  PERCOCET/ROXICET Take 1 tablet by mouth every 6 (six) hours as needed for moderate pain.   pantoprazole 40 MG tablet Commonly known as:  PROTONIX Take 1 tablet (40 mg total) by mouth 2 (two) times daily.       Review of Systems  Constitutional: Negative.   HENT: Negative.   Eyes: Negative.   Respiratory: Negative for apnea, cough, choking, chest tightness, shortness of breath and wheezing.   Cardiovascular:  Negative for chest pain, palpitations and leg swelling.  Gastrointestinal: Negative for abdominal distention, abdominal pain, constipation, diarrhea and nausea.  Endocrine: Negative.   Genitourinary: Negative for difficulty urinating, dysuria, frequency and urgency.  Musculoskeletal: Positive for arthralgias (typical arthritis) and joint swelling. Negative for back pain, gait problem and myalgias.  Skin: Positive for wound (incision on Left hip). Negative for color change, pallor and rash.  Allergic/Immunologic: Negative.   Neurological: Negative for dizziness, tremors, syncope, speech difficulty, weakness, numbness and headaches.  Psychiatric/Behavioral: Negative.   All other systems reviewed and are negative.   Immunization History  Administered Date(s) Administered  . Influenza, High Dose Seasonal PF 05/30/2015   Pertinent  Health Maintenance Due  Topic Date Due  . OPHTHALMOLOGY EXAM  01/19/1957  . COLONOSCOPY  01/19/1997  . PNA vac Low Risk Adult (1 of 2 - PCV13) 01/20/2012  . INFLUENZA VACCINE  03/06/2016  . HEMOGLOBIN A1C  06/24/2016  . FOOT EXAM  12/22/2016   Fall Risk  12/23/2015 07/04/2015 05/30/2015 01/25/2015  Falls in the past year? No Yes No No  Number falls in past yr: - 1 - -  Injury with Fall? - Yes - -   Functional Status Survey:    Vitals:   04/24/16 1925  BP: 136/62  Pulse: 76  Resp: 18  Temp: 98.7 F (37.1 C)  SpO2: 96%  Weight: 278 lb 12.8 oz (126.5 kg)   Body mass index is 37.81 kg/m. Physical Exam  Constitutional: He is oriented to person, place, and time. He appears well-developed and well-nourished. No distress.  Eyes: Conjunctivae and EOM are normal. Pupils are equal, round, and reactive to light. Right eye exhibits no discharge. Left eye exhibits no discharge.  Neck: No JVD present.  Cardiovascular: Normal rate, regular rhythm and normal heart sounds.  Exam reveals no gallop and no friction rub.   No murmur heard. Pulmonary/Chest: Effort  normal. No respiratory distress. He has no wheezes. He has no rales. He exhibits no tenderness.  Abdominal: Soft. Bowel sounds are normal. He exhibits no distension. There is no tenderness. There is no guarding.  Musculoskeletal: He exhibits no edema or deformity.  Neurological: He is alert and oriented to person, place, and time.  Skin: Skin is warm and dry. No rash noted. He is not diaphoretic. No erythema. No pallor.  Psychiatric: He has a normal mood and affect. His behavior is normal. Judgment and thought content normal.    Labs reviewed:  Recent Labs  01/06/16 0918  04/16/16 0417 04/17/16 0505 04/18/16 0255  NA 139  < > 137 136 136  K 4.8  < > 3.6 3.8 3.7  CL 103  < > 105 103 102  CO2 20  < > 27 28 29   GLUCOSE 168*  < > 131* 145* 127*  BUN 16  < > 19 14 16   CREATININE 1.03  < > 0.93 0.86 0.78  CALCIUM 9.9  < >  8.5* 9.0 8.9  MG  --   --  1.8  --   --   PHOS 2.6  --   --   --   --   < > = values in this interval not displayed.  Recent Labs  05/30/15 1021 12/23/15 0959 01/06/16 0918  AST 19 19  --   ALT 22 17  --   ALKPHOS 78 66  --   BILITOT 0.5 0.5  --   PROT 7.4 7.5  --   ALBUMIN 4.3 4.2 3.8    Recent Labs  04/13/16 1621  04/15/16 0555 04/16/16 0417 04/17/16 0505 04/18/16 0255  WBC 10.8*  < > 12.0* 14.3* 12.5* 11.5*  NEUTROABS 8.3*  --  9.6*  --   --   --   HGB 13.5  < > 10.4* 9.5* 8.9* 9.1*  HCT 39.7*  < > 30.1* 27.4* 25.8* 26.5*  MCV 85.6  < > 85.8 85.1 85.3 84.7  PLT 238  < > 160 153 164 208  < > = values in this interval not displayed. Lab Results  Component Value Date   TSH 3.470 05/30/2015   Lab Results  Component Value Date   HGBA1C 8.5 (H) 12/23/2015   Lab Results  Component Value Date   CHOL 129 12/23/2015   HDL 35 (L) 12/23/2015   LDLCALC 80 12/23/2015   TRIG 69 12/23/2015   CHOLHDL 3.5 05/30/2015    Significant Diagnostic Results in last 30 days:  Dg Chest 1 View  Result Date: 04/15/2016 CLINICAL DATA:  Postop for hip  surgery, shortness of Breath EXAM: CHEST 1 VIEW COMPARISON:  04/13/2016 FINDINGS: Cardiomediastinal silhouette is stable. Mild elevation of the right hemidiaphragm again noted. No acute infiltrate or pleural effusion. No pulmonary edema. IMPRESSION: No active disease. Electronically Signed   By: Lahoma Crocker M.D.   On: 04/15/2016 11:25   Dg Chest 1 View  Result Date: 04/13/2016 CLINICAL DATA:  [place] from standing position landing on the left side. EXAM: CHEST 1 VIEW COMPARISON:  None. FINDINGS: Heart size is at the upper limits of normal. The aorta shows ectasia. The pulmonary vascularity is normal. Lungs are clear. No bone abnormality seen. IMPRESSION: Ectatic aorta.  No active disease. Electronically Signed   By: Nelson Chimes M.D.   On: 04/13/2016 15:57   Ct Head Wo Contrast  Result Date: 04/13/2016 CLINICAL DATA:  Pain following fall EXAM: CT HEAD WITHOUT CONTRAST TECHNIQUE: Contiguous axial images were obtained from the base of the skull through the vertex without intravenous contrast. COMPARISON:  Brain MRI October 12, 2008 FINDINGS: Brain: There is age related volume loss. The ventricles are normal in configuration. There is no intracranial mass, hemorrhage, extra-axial fluid collection, or midline shift. There is minimal small vessel disease in the centra semiovale bilaterally. Elsewhere gray-white compartments appear normal. No acute infarct evident. Vascular: There are no hyperdense vessels appreciable. There is a small amount of calcification in the left carotid siphon. Skull: The bony calvarium appears intact. There are calcifications along the falx which appear benign. There is a small bony enostosis along the right frontal bone which appears benign. There is no edema in these areas. There is mucosal thickening in several ethmoid air cells bilaterally. There is somewhat ill-defined opacity in the superior right sphenoid sinus as well as in a posterior right ethmoid air cell. Other visualized paranasal  sinuses are clear. Visualized orbits appear symmetric bilaterally. Other: Visualized mastoid air cells are clear. IMPRESSION: Minimal periventricular small vessel  disease. No intracranial mass, hemorrhage, or extra-axial fluid collection. No acute infarct evident. There are areas of paranasal sinus disease. There is a rather minimal amount of calcification in the left carotid siphon. Electronically Signed   By: Lowella Grip III M.D.   On: 04/13/2016 15:18   Ct Angio Chest Pe W Or Wo Contrast  Result Date: 04/15/2016 CLINICAL DATA:  Dyspnea, tachycardia. EXAM: CT ANGIOGRAPHY CHEST WITH CONTRAST TECHNIQUE: Multidetector CT imaging of the chest was performed using the standard protocol during bolus administration of intravenous contrast. Multiplanar CT image reconstructions and MIPs were obtained to evaluate the vascular anatomy. CONTRAST:  75 mL of Isovue 370 intravenously. COMPARISON:  Radiograph of same day. FINDINGS: No pneumothorax or pleural effusion is noted. Mild bilateral posterior basilar subsegmental atelectasis is noted. Filling defects are noted in upper and lower lobe branches of the left pulmonary artery consistent with pulmonary emboli. 4.4 cm ascending thoracic aortic aneurysm is noted. No dissection is noted. Great vessels are widely patent. Visualized portion of upper abdomen is unremarkable. No mediastinal mass or adenopathy is noted. Anterior osteophyte formation is seen throughout the thoracic spine. Review of the MIP images confirms the above findings. IMPRESSION: 4.4 cm ascending thoracic aortic aneurysm. Recommend annual imaging followup by CTA or MRA. This recommendation follows 2010 ACCF/AHA/AATS/ACR/ASA/SCA/SCAI/SIR/STS/SVM Guidelines for the Diagnosis and Management of Patients with Thoracic Aortic Disease. Circulation. 2010; [id number]. Acute pulmonary emboli are seen involving the upper and lower lobe branches of the left pulmonary artery. Critical Value/emergent results were  called by telephone at the time of interpretation on 04/15/2016 at 2:55 pm to Dr. Laverle Hobby , who verbally acknowledged these results. Electronically Signed   By: Marijo Conception, M.D.   On: 04/15/2016 14:56   Ct Abdomen Pelvis W Contrast  Result Date: 04/17/2016 CLINICAL DATA:  Generalized abdomen pain with nausea vomiting starting today. EXAM: CT ABDOMEN AND PELVIS WITH CONTRAST TECHNIQUE: Multidetector CT imaging of the abdomen and pelvis was performed using the standard protocol following bolus administration of intravenous contrast. CONTRAST:  181mL ISOVUE-300 IOPAMIDOL (ISOVUE-300) INJECTION 61% COMPARISON:  October 21, 2014 FINDINGS: Lower chest: There is mild dependent atelectasis of posterior lung bases. Minimal right pleural effusion is noted. Hepatobiliary: There is diffuse low density of the liver without vessel displacement. No focal liver lesion is identified. The gallbladder is normal. Pancreas: Unremarkable. No pancreatic ductal dilatation or surrounding inflammatory changes. Spleen: Normal in size without focal abnormality. Adrenals/Urinary Tract: The adrenal glands are normal. There is chronic bilateral perinephric stranding. There is no hydronephrosis bilaterally. No focal renal lesion is identified. The bladder is normal. Stomach/Bowel: There is mild stranding surrounding the duodenum. There is no bowel obstruction. The stomach is normal. Colon is normal. The appendix is normal. Vascular/Lymphatic: Aortic atherosclerosis. No enlarged abdominal or pelvic lymph nodes. Reproductive: Prostate is unremarkable. Other: No abdominal wall hernia or abnormality. No abdominopelvic ascites. Musculoskeletal: No acute or significant osseous findings. Prior fixation of the left hip is noted. Degenerative joint changes of the spine are noted. IMPRESSION: Mild stranding surrounding duodenum consistent with duodenitis. Fatty infiltration of liver. Minimal right pleural effusion. Electronically Signed    By: Abelardo Diesel M.D.   On: 04/17/2016 15:49   US Venous Img Lower Bilateral  Result Date: 04/16/2016 CLINICAL DATA:  Acute left leg pain and swelling. EXAM: BILATERAL LOWER EXTREMITY VENOUS DOPPLER ULTRASOUND TECHNIQUE: Gray-scale sonography with graded compression, as well as color Doppler and duplex ultrasound were performed to evaluate the lower extremity deep venous systems  from the level of the common femoral vein and including the common femoral, femoral, profunda femoral, popliteal and calf veins including the posterior tibial, peroneal and gastrocnemius veins when visible. The superficial great saphenous vein was also interrogated. Spectral Doppler was utilized to evaluate flow at rest and with distal augmentation maneuvers in the common femoral, femoral and popliteal veins. COMPARISON:  None. FINDINGS: RIGHT LOWER EXTREMITY Common Femoral Vein: No evidence of thrombus. Normal compressibility, respiratory phasicity and response to augmentation. Saphenofemoral Junction: No evidence of thrombus. Normal compressibility and flow on color Doppler imaging. Profunda Femoral Vein: No evidence of thrombus. Normal compressibility and flow on color Doppler imaging. Femoral Vein: No evidence of thrombus. Normal compressibility, respiratory phasicity and response to augmentation. Popliteal Vein: No evidence of thrombus. Normal compressibility, respiratory phasicity and response to augmentation. Calf Veins: No evidence of thrombus. Normal compressibility and flow on color Doppler imaging. Superficial Great Saphenous Vein: No evidence of thrombus. Normal compressibility and flow on color Doppler imaging. Venous Reflux:  None. Other Findings:  None. LEFT LOWER EXTREMITY Common Femoral Vein: No evidence of thrombus. Normal compressibility, respiratory phasicity and response to augmentation. Saphenofemoral Junction: No evidence of thrombus. Normal compressibility and flow on color Doppler imaging. Profunda Femoral Vein:  No evidence of thrombus. Normal compressibility and flow on color Doppler imaging. Femoral Vein: No evidence of thrombus. Normal compressibility, respiratory phasicity and response to augmentation. Popliteal Vein: No evidence of thrombus. Normal compressibility, respiratory phasicity and response to augmentation. Calf Veins: No evidence of thrombus. Normal compressibility and flow on color Doppler imaging. Superficial Great Saphenous Vein: No evidence of thrombus. Normal compressibility and flow on color Doppler imaging. Venous Reflux:  None. Other Findings:  None. IMPRESSION: No evidence of deep venous thrombosis. Electronically Signed   By: Jerilynn Mages.  Shick M.D.   On: 04/16/2016 11:21   Dg Chest Port 1 View  Result Date: 04/15/2016 CLINICAL DATA:  Evaluate central line placement. EXAM: PORTABLE CHEST 1 VIEW COMPARISON:  April 15, 2016 FINDINGS: A new left central line terminates with the tip near the caval atrial junction, in good position. No pneumothorax. Mild atelectasis in the bases. No other interval changes or acute abnormalities. IMPRESSION: The left central line is in good position with no pneumothorax. Electronically Signed   By: Dorise Bullion III M.D   On: 04/15/2016 13:06   Dg Abd 2 Views  Result Date: 04/16/2016 CLINICAL DATA:  70 year old male with history of emesis since surgery 2 days ago. EXAM: ABDOMEN - 2 VIEW COMPARISON:  No priors. FINDINGS: Gas and stool are noted throughout the colon extending to the level of the distal rectum. No pathologic dilatation of small bowel. No pneumoperitoneum. Orthopedic fixation hardware in the left hip incompletely imaged. Healing intertrochanteric left hip fracture. Skin staples adjacent to the left hip. IMPRESSION: 1. Nonobstructive bowel gas pattern. 2. No pneumoperitoneum. Electronically Signed   By: Vinnie Langton M.D.   On: 04/16/2016 18:14   Dg Hip Operative Unilat W Or W/o Pelvis Left  Result Date: 04/14/2016 CLINICAL DATA:  Intra medullary  rod fixation of left femur fracture. EXAM: OPERATIVE LEFT HIP (WITH PELVIS IF PERFORMED) 4 VIEWS TECHNIQUE: Fluoroscopic spot image(s) were submitted for interpretation post-operatively. COMPARISON:  Left hip radiographs - 04/13/2016 FINDINGS: 4 spot intraoperative fluoroscopic images of the right hip and femur are provided for review Images demonstrate the sequela of intra medullary rod fixation of the left femur with dynamic screw fixation of the left femoral neck. The distal end of the femoral rod is  transfixed with a single cancellous screw. Overall improved alignment of previously noted intertrochanteric femur fracture with minimal residual displacement. There is a minimal subcutaneous emphysema about the operative site. No radiopaque foreign body. IMPRESSION: Post intra medullary rod fixation of the left femur and dynamic screw fixation of left femoral neck without evidence of complication. Electronically Signed   By: Sandi Mariscal M.D.   On: 04/14/2016 10:43   Dg Hip Unilat W Or Wo Pelvis 2-3 Views Left  Result Date: 04/13/2016 CLINICAL DATA:  Right status post fall trying to run from bees. Left hip pain. Initial encounter. EXAM: DG HIP (WITH OR WITHOUT PELVIS) 2-3V LEFT COMPARISON:  None. FINDINGS: The patient has an acute left intertrochanteric fracture which is mildly comminuted. The femoral head is located. No other acute abnormality is seen. Lower lumbar spondylosis noted. IMPRESSION: Acute left intertrochanteric fracture. Electronically Signed   By: Inge Rise M.D.   On: 04/13/2016 16:00    Assessment/Plan 1. Pain, acute postoperative  Continue complementary therapies including:  PT/OT  Restorative Nursing  Ice pack to site QID and prn  Diversional activities  Repositioning Q2 hours and prn  Scheduled Tylenol 650 mg po QID  Prescription written and forwarded to pharmacy for:   Percocet 5/325 mg 1-2 tablets PO Q 4 hours prn pain; 1-moderate, 2-severe #120, no  refills  Family/ staff Communication:   Total Time:  Documentation:  Face to Face:  Family/Phone:   Labs/tests ordered:    Medication list reviewed and assessed for continued appropriateness. It is my medical opinion discontinuation of medications would result in decompensation and decreased quality of life.   Vikki Ports, NP-C Geriatrics [facility] [phone], [place] [address] Cell Phone (Mon-Fri 8am-5pm):  [phone] On Call:  [phone] & follow prompts after 5pm & weekends Office Phone:  [phone] Office Fax:  [phone]

## 2016-04-25 NOTE — Telephone Encounter (Signed)
Thank you :)

## 2016-05-02 ENCOUNTER — Ambulatory Visit: Payer: Medicare Other | Admitting: Family Medicine

## 2016-05-03 ENCOUNTER — Other Ambulatory Visit: Payer: Self-pay | Admitting: Family Medicine

## 2016-05-03 NOTE — Telephone Encounter (Signed)
Patient no showed for appt yesterday Please call him, find out how he's doing See if he is at home or at a nursing facility for rehab Also, please find out if his insulin dose changed at all, or if he's still taking eleven units three times a day with meals Thank you

## 2016-05-03 NOTE — Telephone Encounter (Signed)
  Pt  Did not show up for his appt yesterday therefore call pt; pt did not  answer; left a voicemail to give me a call back.

## 2016-05-06 ENCOUNTER — Other Ambulatory Visit: Payer: Self-pay | Admitting: Family Medicine

## 2016-05-06 ENCOUNTER — Encounter
Admission: RE | Admit: 2016-05-06 | Discharge: 2016-05-06 | Disposition: A | Discharge status: Home / Self Care | Payer: Medicare PPO | Source: Ambulatory Visit | Attending: Internal Medicine | Admitting: Internal Medicine

## 2016-05-06 DIAGNOSIS — E119 Type 2 diabetes mellitus without complications: Secondary | ICD-10-CM | POA: Insufficient documentation

## 2016-05-06 NOTE — Telephone Encounter (Signed)
Per [facility] link, patient is at [name] [place] request denied; he should be receiving medicine via nursing at [facility]

## 2016-05-07 ENCOUNTER — Non-Acute Institutional Stay (SKILLED_NURSING_FACILITY): Payer: Medicare Other | Admitting: Gerontology

## 2016-05-07 DIAGNOSIS — E119 Type 2 diabetes mellitus without complications: Secondary | ICD-10-CM | POA: Diagnosis not present

## 2016-05-07 DIAGNOSIS — G8918 Other acute postprocedural pain: Secondary | ICD-10-CM | POA: Diagnosis not present

## 2016-05-07 DIAGNOSIS — S72002D Fracture of unspecified part of neck of left femur, subsequent encounter for closed fracture with routine healing: Secondary | ICD-10-CM

## 2016-05-07 LAB — GLUCOSE, CAPILLARY
Glucose-Capillary: 139 mg/dL — ABNORMAL HIGH (ref 65–99)
Glucose-Capillary: 122 mg/dL — ABNORMAL HIGH (ref 65–99)
Glucose-Capillary: 117 mg/dL — ABNORMAL HIGH (ref 65–99)
Glucose-Capillary: 134 mg/dL — ABNORMAL HIGH (ref 65–99)

## 2016-05-08 DIAGNOSIS — E119 Type 2 diabetes mellitus without complications: Secondary | ICD-10-CM | POA: Diagnosis not present

## 2016-05-08 LAB — GLUCOSE, CAPILLARY
Glucose-Capillary: 122 mg/dL — ABNORMAL HIGH (ref 65–99)
Glucose-Capillary: 194 mg/dL — ABNORMAL HIGH (ref 65–99)
Glucose-Capillary: 148 mg/dL — ABNORMAL HIGH (ref 65–99)
Glucose-Capillary: 159 mg/dL — ABNORMAL HIGH (ref 65–99)

## 2016-05-09 DIAGNOSIS — E119 Type 2 diabetes mellitus without complications: Secondary | ICD-10-CM | POA: Diagnosis not present

## 2016-05-09 LAB — GLUCOSE, CAPILLARY: Glucose-Capillary: 186 mg/dL — ABNORMAL HIGH (ref 65–99)

## 2016-05-09 NOTE — Progress Notes (Signed)
Location:      Place of Service:  SNF (31) Provider:  Toni Arthurs, NP-C  Ashok Norris, MD  Patient Care Team: Ashok Norris, MD as PCP - General (Family Medicine) Ashok Norris, MD [facility] Medicine)  Extended Emergency Contact Information Primary Emergency Contact: Gagliardo,Estell Address: [address]          [place], [place] [address] Johnnette Litter of [place] Phone: [phone] Relation: Spouse  Code Status:  full Goals of care: Advanced Directive information Advanced Directives 04/13/2016  Does patient have an advance directive? No  Would patient like information on creating an advanced directive? No - patient declined information     Chief Complaint  Patient presents with  . Discharge Note    HPI:  Pt is a 70 y.o. male seen today for discharge evaluation. Pt was admitted to the facility from [facility] for rehab s/p L Hip fracture with surgical intervention. The patient describes their pain as well controlled on current regimen. Average pain score is 5/10. Other modalities used to control symptoms include ice, rest. Pt has tried lower dose of percocet in the past with unsatisfactory or incomplete relief of symptoms. He has progressed well with therapy and feels he is ready to go home. Pain controlled. No dyspnea. Bowels moving. VSS. No other c/o.  Past Medical History:  Diagnosis Date  . Diabetes mellitus without complication ([place])   . Essential hypertension, benign 12/23/2015  . Hx of tobacco use, presenting hazards to health 12/23/2015   Quit prior to 1997  . Hyperlipidemia 12/23/2015  . Hypertension   . Obesity 12/23/2015   Past Surgical History:  Procedure Laterality Date  . EYE SURGERY    . INTRAMEDULLARY (IM) NAIL INTERTROCHANTERIC Left 04/14/2016   Procedure: INTRAMEDULLARY (IM) NAIL INTERTROCHANTRIC;  Surgeon: Corky Mull, MD;  Location: [facility] ORS;  Service: Orthopedics;  Laterality: Left;    No Known Allergies    Medication List       Accurate  as of 05/07/16 11:59 PM. Always use your most recent med list.          acetaminophen 500 MG tablet Commonly known as:  TYLENOL Take 500 mg by mouth every 6 (six) hours as needed.   alum & mag hydroxide-simeth 200-200-20 MG/5ML suspension Commonly known as:  MAALOX/MYLANTA Take 30 mLs by mouth every 4 (four) hours as needed for indigestion or heartburn.   amLODipine 5 MG tablet Commonly known as:  NORVASC TAKE 1 TABLET EVERY DAY   apixaban 5 MG Tabs tablet Commonly known as:  ELIQUIS Take 2 tablets (10 mg total) by mouth 2 (two) times daily.   aspirin EC 81 MG tablet Take 81 mg by mouth daily.   atorvastatin 40 MG tablet Commonly known as:  LIPITOR TAKE 1 TABLET BY MOUTH AT BEDTIME   bisacodyl 10 MG suppository Commonly known as:  DULCOLAX Place 1 suppository (10 mg total) rectally daily as needed for moderate constipation.   cholecalciferol 1000 units tablet Commonly known as:  VITAMIN D Take 1,000 Units by mouth 2 (two) times daily.   FISH OIL + D3 1200-1000 MG-UNIT Caps Take 1,200 mg by mouth daily.   insulin regular 100 units/mL injection Commonly known as:  HUMULIN R Inject 0.11 mLs (11 Units total) into the skin 3 (three) times daily before meals. This replaces NOVOLOG (this is short-acting insulin)   lactulose 10 GM/15ML solution Commonly known as:  CHRONULAC Take 45 mLs (30 g total) by mouth 2 (two) times daily.   LANTUS  SOLOSTAR 100 UNIT/ML Solostar Pen Generic drug:  Insulin Glargine INJECT 56 UNITS SUBCUTANEOUSLY ONCE A DAY   losartan 100 MG tablet Commonly known as:  COZAAR Take 1 tablet (100 mg total) by mouth daily.   oxyCODONE-acetaminophen 5-325 MG tablet Commonly known as:  PERCOCET/ROXICET Take 1 tablet by mouth every 6 (six) hours as needed for moderate pain.   pantoprazole 40 MG tablet Commonly known as:  PROTONIX Take 1 tablet (40 mg total) by mouth 2 (two) times daily.       Review of Systems  Constitutional: Negative.  Negative  for chills, diaphoresis and fever.  HENT: Negative.   Eyes: Negative.   Respiratory: Negative for apnea, cough, choking, chest tightness, shortness of breath and wheezing.   Cardiovascular: Negative for chest pain, palpitations and leg swelling.  Gastrointestinal: Negative for abdominal distention, abdominal pain, constipation, diarrhea and nausea.  Endocrine: Negative.   Genitourinary: Negative for difficulty urinating, dysuria, frequency and urgency.  Musculoskeletal: Positive for arthralgias (typical arthritis). Negative for back pain, gait problem, joint swelling and myalgias.  Skin: Positive for wound (incision on Left hip). Negative for color change, pallor and rash.  Allergic/Immunologic: Negative.   Neurological: Negative for dizziness, tremors, syncope, speech difficulty, weakness, numbness and headaches.  Psychiatric/Behavioral: Negative.   All other systems reviewed and are negative.   Immunization History  Administered Date(s) Administered  . Influenza, High Dose Seasonal PF 05/30/2015   Pertinent  Health Maintenance Due  Topic Date Due  . OPHTHALMOLOGY EXAM  01/19/1956  . COLONOSCOPY  01/19/1997  . PNA vac Low Risk Adult (1 of 2 - PCV13) 01/20/2012  . INFLUENZA VACCINE  03/06/2016  . HEMOGLOBIN A1C  06/24/2016  . FOOT EXAM  12/22/2016   Fall Risk  12/23/2015 07/04/2015 05/30/2015 01/25/2015  Falls in the past year? No Yes No No  Number falls in past yr: - 1 - -  Injury with Fall? - Yes - -   Functional Status Survey:    Vitals:   05/07/16 0530  BP: 112/68  Pulse: 78  Resp: 20  Temp: 98.3 F (36.8 C)  SpO2: 99%  Weight: 272 lb 4.8 oz (123.5 kg)   Body mass index is 36.93 kg/m. Physical Exam  Constitutional: He is oriented to person, place, and time. He appears well-developed and well-nourished. No distress.  Eyes: Conjunctivae and EOM are normal. Pupils are equal, round, and reactive to light. Right eye exhibits no discharge. Left eye exhibits no  discharge.  Neck: No JVD present.  Cardiovascular: Normal rate, regular rhythm and normal heart sounds.  Exam reveals no gallop and no friction rub.   No murmur heard. Pulmonary/Chest: Effort normal. No respiratory distress. He has no wheezes. He has no rales. He exhibits no tenderness.  Abdominal: Soft. Bowel sounds are normal. He exhibits no distension. There is no tenderness. There is no guarding.  Musculoskeletal: He exhibits no edema or deformity.  Neurological: He is alert and oriented to person, place, and time.  Skin: Skin is warm and dry. No rash noted. He is not diaphoretic. No erythema. No pallor.  Psychiatric: He has a normal mood and affect. His behavior is normal. Judgment and thought content normal.    Labs reviewed:  Recent Labs  01/06/16 0918  04/16/16 0417 04/17/16 0505 04/18/16 0255  NA 139  < > 137 136 136  K 4.8  < > 3.6 3.8 3.7  CL 103  < > 105 103 102  CO2 20  < > 27 28 29  GLUCOSE 168*  < > 131* 145* 127*  BUN 16  < > 19 14 16   CREATININE 1.03  < > 0.93 0.86 0.78  CALCIUM 9.9  < > 8.5* 9.0 8.9  MG  --   --  1.8  --   --   PHOS 2.6  --   --   --   --   < > = values in this interval not displayed.  Recent Labs  05/30/15 1021 12/23/15 0959 01/06/16 0918  AST 19 19  --   ALT 22 17  --   ALKPHOS 78 66  --   BILITOT 0.5 0.5  --   PROT 7.4 7.5  --   ALBUMIN 4.3 4.2 3.8    Recent Labs  04/13/16 1621  04/15/16 0555 04/16/16 0417 04/17/16 0505 04/18/16 0255  WBC 10.8*  < > 12.0* 14.3* 12.5* 11.5*  NEUTROABS 8.3*  --  9.6*  --   --   --   HGB 13.5  < > 10.4* 9.5* 8.9* 9.1*  HCT 39.7*  < > 30.1* 27.4* 25.8* 26.5*  MCV 85.6  < > 85.8 85.1 85.3 84.7  PLT 238  < > 160 153 164 208  < > = values in this interval not displayed. Lab Results  Component Value Date   TSH 3.470 05/30/2015   Lab Results  Component Value Date   HGBA1C 8.5 (H) 12/23/2015   Lab Results  Component Value Date   CHOL 129 12/23/2015   HDL 35 (L) 12/23/2015   LDLCALC 80  12/23/2015   TRIG 69 12/23/2015   CHOLHDL 3.5 05/30/2015    Significant Diagnostic Results in last 30 days:  Dg Chest 1 View  Result Date: 04/15/2016 CLINICAL DATA:  Postop for hip surgery, shortness of Breath EXAM: CHEST 1 VIEW COMPARISON:  04/13/2016 FINDINGS: Cardiomediastinal silhouette is stable. Mild elevation of the right hemidiaphragm again noted. No acute infiltrate or pleural effusion. No pulmonary edema. IMPRESSION: No active disease. Electronically Signed   By: Lahoma Crocker M.D.   On: 04/15/2016 11:25   Dg Chest 1 View  Result Date: 04/13/2016 CLINICAL DATA:  [place] from standing position landing on the left side. EXAM: CHEST 1 VIEW COMPARISON:  None. FINDINGS: Heart size is at the upper limits of normal. The aorta shows ectasia. The pulmonary vascularity is normal. Lungs are clear. No bone abnormality seen. IMPRESSION: Ectatic aorta.  No active disease. Electronically Signed   By: Nelson Chimes M.D.   On: 04/13/2016 15:57   Ct Head Wo Contrast  Result Date: 04/13/2016 CLINICAL DATA:  Pain following fall EXAM: CT HEAD WITHOUT CONTRAST TECHNIQUE: Contiguous axial images were obtained from the base of the skull through the vertex without intravenous contrast. COMPARISON:  Brain MRI October 12, 2008 FINDINGS: Brain: There is age related volume loss. The ventricles are normal in configuration. There is no intracranial mass, hemorrhage, extra-axial fluid collection, or midline shift. There is minimal small vessel disease in the centra semiovale bilaterally. Elsewhere gray-white compartments appear normal. No acute infarct evident. Vascular: There are no hyperdense vessels appreciable. There is a small amount of calcification in the left carotid siphon. Skull: The bony calvarium appears intact. There are calcifications along the falx which appear benign. There is a small bony enostosis along the right frontal bone which appears benign. There is no edema in these areas. There is mucosal thickening in  several ethmoid air cells bilaterally. There is somewhat ill-defined opacity in the superior right sphenoid sinus as  well as in a posterior right ethmoid air cell. Other visualized paranasal sinuses are clear. Visualized orbits appear symmetric bilaterally. Other: Visualized mastoid air cells are clear. IMPRESSION: Minimal periventricular small vessel disease. No intracranial mass, hemorrhage, or extra-axial fluid collection. No acute infarct evident. There are areas of paranasal sinus disease. There is a rather minimal amount of calcification in the left carotid siphon. Electronically Signed   By: Lowella Grip III M.D.   On: 04/13/2016 15:18   Ct Angio Chest Pe W Or Wo Contrast  Result Date: 04/15/2016 CLINICAL DATA:  Dyspnea, tachycardia. EXAM: CT ANGIOGRAPHY CHEST WITH CONTRAST TECHNIQUE: Multidetector CT imaging of the chest was performed using the standard protocol during bolus administration of intravenous contrast. Multiplanar CT image reconstructions and MIPs were obtained to evaluate the vascular anatomy. CONTRAST:  75 mL of Isovue 370 intravenously. COMPARISON:  Radiograph of same day. FINDINGS: No pneumothorax or pleural effusion is noted. Mild bilateral posterior basilar subsegmental atelectasis is noted. Filling defects are noted in upper and lower lobe branches of the left pulmonary artery consistent with pulmonary emboli. 4.4 cm ascending thoracic aortic aneurysm is noted. No dissection is noted. Great vessels are widely patent. Visualized portion of upper abdomen is unremarkable. No mediastinal mass or adenopathy is noted. Anterior osteophyte formation is seen throughout the thoracic spine. Review of the MIP images confirms the above findings. IMPRESSION: 4.4 cm ascending thoracic aortic aneurysm. Recommend annual imaging followup by CTA or MRA. This recommendation follows 2010 ACCF/AHA/AATS/ACR/ASA/SCA/SCAI/SIR/STS/SVM Guidelines for the Diagnosis and Management of Patients with Thoracic  Aortic Disease. Circulation. 2010; [id number]. Acute pulmonary emboli are seen involving the upper and lower lobe branches of the left pulmonary artery. Critical Value/emergent results were called by telephone at the time of interpretation on 04/15/2016 at 2:55 pm to Dr. Laverle Hobby , who verbally acknowledged these results. Electronically Signed   By: Marijo Conception, M.D.   On: 04/15/2016 14:56   Ct Abdomen Pelvis W Contrast  Result Date: 04/17/2016 CLINICAL DATA:  Generalized abdomen pain with nausea vomiting starting today. EXAM: CT ABDOMEN AND PELVIS WITH CONTRAST TECHNIQUE: Multidetector CT imaging of the abdomen and pelvis was performed using the standard protocol following bolus administration of intravenous contrast. CONTRAST:  165mL ISOVUE-300 IOPAMIDOL (ISOVUE-300) INJECTION 61% COMPARISON:  October 21, 2014 FINDINGS: Lower chest: There is mild dependent atelectasis of posterior lung bases. Minimal right pleural effusion is noted. Hepatobiliary: There is diffuse low density of the liver without vessel displacement. No focal liver lesion is identified. The gallbladder is normal. Pancreas: Unremarkable. No pancreatic ductal dilatation or surrounding inflammatory changes. Spleen: Normal in size without focal abnormality. Adrenals/Urinary Tract: The adrenal glands are normal. There is chronic bilateral perinephric stranding. There is no hydronephrosis bilaterally. No focal renal lesion is identified. The bladder is normal. Stomach/Bowel: There is mild stranding surrounding the duodenum. There is no bowel obstruction. The stomach is normal. Colon is normal. The appendix is normal. Vascular/Lymphatic: Aortic atherosclerosis. No enlarged abdominal or pelvic lymph nodes. Reproductive: Prostate is unremarkable. Other: No abdominal wall hernia or abnormality. No abdominopelvic ascites. Musculoskeletal: No acute or significant osseous findings. Prior fixation of the left hip is noted. Degenerative  joint changes of the spine are noted. IMPRESSION: Mild stranding surrounding duodenum consistent with duodenitis. Fatty infiltration of liver. Minimal right pleural effusion. Electronically Signed   By: Abelardo Diesel M.D.   On: 04/17/2016 15:49   US Venous Img Lower Bilateral  Result Date: 04/16/2016 CLINICAL DATA:  Acute left leg pain and  swelling. EXAM: BILATERAL LOWER EXTREMITY VENOUS DOPPLER ULTRASOUND TECHNIQUE: Gray-scale sonography with graded compression, as well as color Doppler and duplex ultrasound were performed to evaluate the lower extremity deep venous systems from the level of the common femoral vein and including the common femoral, femoral, profunda femoral, popliteal and calf veins including the posterior tibial, peroneal and gastrocnemius veins when visible. The superficial great saphenous vein was also interrogated. Spectral Doppler was utilized to evaluate flow at rest and with distal augmentation maneuvers in the common femoral, femoral and popliteal veins. COMPARISON:  None. FINDINGS: RIGHT LOWER EXTREMITY Common Femoral Vein: No evidence of thrombus. Normal compressibility, respiratory phasicity and response to augmentation. Saphenofemoral Junction: No evidence of thrombus. Normal compressibility and flow on color Doppler imaging. Profunda Femoral Vein: No evidence of thrombus. Normal compressibility and flow on color Doppler imaging. Femoral Vein: No evidence of thrombus. Normal compressibility, respiratory phasicity and response to augmentation. Popliteal Vein: No evidence of thrombus. Normal compressibility, respiratory phasicity and response to augmentation. Calf Veins: No evidence of thrombus. Normal compressibility and flow on color Doppler imaging. Superficial Great Saphenous Vein: No evidence of thrombus. Normal compressibility and flow on color Doppler imaging. Venous Reflux:  None. Other Findings:  None. LEFT LOWER EXTREMITY Common Femoral Vein: No evidence of thrombus. Normal  compressibility, respiratory phasicity and response to augmentation. Saphenofemoral Junction: No evidence of thrombus. Normal compressibility and flow on color Doppler imaging. Profunda Femoral Vein: No evidence of thrombus. Normal compressibility and flow on color Doppler imaging. Femoral Vein: No evidence of thrombus. Normal compressibility, respiratory phasicity and response to augmentation. Popliteal Vein: No evidence of thrombus. Normal compressibility, respiratory phasicity and response to augmentation. Calf Veins: No evidence of thrombus. Normal compressibility and flow on color Doppler imaging. Superficial Great Saphenous Vein: No evidence of thrombus. Normal compressibility and flow on color Doppler imaging. Venous Reflux:  None. Other Findings:  None. IMPRESSION: No evidence of deep venous thrombosis. Electronically Signed   By: Jerilynn Mages.  Shick M.D.   On: 04/16/2016 11:21   Dg Chest Port 1 View  Result Date: 04/15/2016 CLINICAL DATA:  Evaluate central line placement. EXAM: PORTABLE CHEST 1 VIEW COMPARISON:  April 15, 2016 FINDINGS: A new left central line terminates with the tip near the caval atrial junction, in good position. No pneumothorax. Mild atelectasis in the bases. No other interval changes or acute abnormalities. IMPRESSION: The left central line is in good position with no pneumothorax. Electronically Signed   By: Dorise Bullion III M.D   On: 04/15/2016 13:06   Dg Abd 2 Views  Result Date: 04/16/2016 CLINICAL DATA:  70 year old male with history of emesis since surgery 2 days ago. EXAM: ABDOMEN - 2 VIEW COMPARISON:  No priors. FINDINGS: Gas and stool are noted throughout the colon extending to the level of the distal rectum. No pathologic dilatation of small bowel. No pneumoperitoneum. Orthopedic fixation hardware in the left hip incompletely imaged. Healing intertrochanteric left hip fracture. Skin staples adjacent to the left hip. IMPRESSION: 1. Nonobstructive bowel gas pattern. 2. No  pneumoperitoneum. Electronically Signed   By: Vinnie Langton M.D.   On: 04/16/2016 18:14   Dg Hip Operative Unilat W Or W/o Pelvis Left  Result Date: 04/14/2016 CLINICAL DATA:  Intra medullary rod fixation of left femur fracture. EXAM: OPERATIVE LEFT HIP (WITH PELVIS IF PERFORMED) 4 VIEWS TECHNIQUE: Fluoroscopic spot image(s) were submitted for interpretation post-operatively. COMPARISON:  Left hip radiographs - 04/13/2016 FINDINGS: 4 spot intraoperative fluoroscopic images of the right hip and femur are provided  for review Images demonstrate the sequela of intra medullary rod fixation of the left femur with dynamic screw fixation of the left femoral neck. The distal end of the femoral rod is transfixed with a single cancellous screw. Overall improved alignment of previously noted intertrochanteric femur fracture with minimal residual displacement. There is a minimal subcutaneous emphysema about the operative site. No radiopaque foreign body. IMPRESSION: Post intra medullary rod fixation of the left femur and dynamic screw fixation of left femoral neck without evidence of complication. Electronically Signed   By: Sandi Mariscal M.D.   On: 04/14/2016 10:43   Dg Hip Unilat W Or Wo Pelvis 2-3 Views Left  Result Date: 04/13/2016 CLINICAL DATA:  Right status post fall trying to run from bees. Left hip pain. Initial encounter. EXAM: DG HIP (WITH OR WITHOUT PELVIS) 2-3V LEFT COMPARISON:  None. FINDINGS: The patient has an acute left intertrochanteric fracture which is mildly comminuted. The femoral head is located. No other acute abnormality is seen. Lower lumbar spondylosis noted. IMPRESSION: Acute left intertrochanteric fracture. Electronically Signed   By: Inge Rise M.D.   On: 04/13/2016 16:00    Assessment/Plan 1. Pain, acute postoperative  Continue complementary therapies including:  PT/OT  Restorative Nursing  Ice pack to site QID and prn  Diversional activities  Repositioning Q2 hours and  prn  Scheduled Tylenol 650 mg po QID  Prescription written and forwarded to pharmacy for:   Percocet 5/325 mg 1-2 tablets PO Q 4 hours prn pain; 1-moderate, 2-severe #120, no refills  2. Closed Left Hip Fracture, subsequent encounter  Continue PT/OT at home  Pain meds as listed prn for pain  Follow up with orthopedist and pcp asap  Hip precautions  Family/ staff Communication:   Total Time:  Documentation:  Face to Face:  Family/Phone:   Labs/tests ordered:  Per pcp  Medication list reviewed and assessed for continued appropriateness. It is my medical opinion discontinuation of medications would result in decompensation and decreased quality of life.   Vikki Ports, NP-C Geriatrics [facility] [phone], [place] [address] Cell Phone (Mon-Fri 8am-5pm):  [phone] On Call:  [phone] & follow prompts after 5pm & weekends Office Phone:  [phone] Office Fax:  [phone]

## 2016-05-15 ENCOUNTER — Emergency Department
Admission: EM | Admit: 2016-05-15 | Discharge: 2016-05-16 | Disposition: A | Discharge status: Home / Self Care | Payer: Medicare Other | Attending: Student | Admitting: Student

## 2016-05-15 ENCOUNTER — Emergency Department: Payer: Medicare Other

## 2016-05-15 ENCOUNTER — Telehealth: Payer: Self-pay | Admitting: Family Medicine

## 2016-05-15 ENCOUNTER — Encounter: Payer: Self-pay | Admitting: Emergency Medicine

## 2016-05-15 DIAGNOSIS — Z7982 Long term (current) use of aspirin: Secondary | ICD-10-CM | POA: Diagnosis not present

## 2016-05-15 DIAGNOSIS — R600 Localized edema: Secondary | ICD-10-CM | POA: Diagnosis not present

## 2016-05-15 DIAGNOSIS — I10 Essential (primary) hypertension: Secondary | ICD-10-CM | POA: Insufficient documentation

## 2016-05-15 DIAGNOSIS — R609 Edema, unspecified: Secondary | ICD-10-CM

## 2016-05-15 DIAGNOSIS — E119 Type 2 diabetes mellitus without complications: Secondary | ICD-10-CM | POA: Diagnosis not present

## 2016-05-15 DIAGNOSIS — Z79899 Other long term (current) drug therapy: Secondary | ICD-10-CM | POA: Insufficient documentation

## 2016-05-15 DIAGNOSIS — Z87891 Personal history of nicotine dependence: Secondary | ICD-10-CM | POA: Insufficient documentation

## 2016-05-15 DIAGNOSIS — Z794 Long term (current) use of insulin: Secondary | ICD-10-CM | POA: Diagnosis not present

## 2016-05-15 DIAGNOSIS — R0602 Shortness of breath: Secondary | ICD-10-CM | POA: Insufficient documentation

## 2016-05-15 DIAGNOSIS — R2243 Localized swelling, mass and lump, lower limb, bilateral: Secondary | ICD-10-CM | POA: Diagnosis present

## 2016-05-15 LAB — CBC
HCT: 31.2 % — ABNORMAL LOW (ref 40.0–52.0)
Hemoglobin: 10.6 g/dL — ABNORMAL LOW (ref 13.0–18.0)
MCH: 29.3 pg (ref 26.0–34.0)
MCHC: 34.1 g/dL (ref 32.0–36.0)
MCV: 86 fL (ref 80.0–100.0)
Platelets: 308 10*3/uL (ref 150–440)
RBC: 3.63 MIL/uL — ABNORMAL LOW (ref 4.40–5.90)
RDW: 15.2 % — ABNORMAL HIGH (ref 11.5–14.5)
WBC: 8.4 10*3/uL (ref 3.8–10.6)

## 2016-05-15 LAB — COMPREHENSIVE METABOLIC PANEL
ALT: 21 U/L (ref 17–63)
AST: 19 U/L (ref 15–41)
Albumin: 3.3 g/dL — ABNORMAL LOW (ref 3.5–5.0)
Alkaline Phosphatase: 71 U/L (ref 38–126)
Anion gap: 5 (ref 5–15)
BUN: 13 mg/dL (ref 6–20)
CO2: 27 mmol/L (ref 22–32)
Calcium: 10.5 mg/dL — ABNORMAL HIGH (ref 8.9–10.3)
Chloride: 104 mmol/L (ref 101–111)
Creatinine, Ser: 1.17 mg/dL (ref 0.61–1.24)
GFR calc Af Amer: >60 mL/min (ref >60)
GFR calc non Af Amer: >60 mL/min (ref >60)
Glucose, Bld: 99 mg/dL (ref 65–99)
Potassium: 3.8 mmol/L (ref 3.5–5.1)
Sodium: 136 mmol/L (ref 135–145)
Total Bilirubin: 0.5 mg/dL (ref 0.3–1.2)
Total Protein: 7.6 g/dL (ref 6.5–8.1)

## 2016-05-15 LAB — BRAIN NATRIURETIC PEPTIDE: B Natriuretic Peptide: 8 pg/mL (ref 0.0–100.0)

## 2016-05-15 LAB — TROPONIN I: Troponin I: <0.03 ng/mL (ref <0.03)

## 2016-05-15 MED ORDER — APIXABAN 5 MG PO TABS
5.0000 mg | ORAL_TABLET | Freq: Once | ORAL | Status: AC
  Administered 2016-05-15: 5 mg via ORAL
  Filled 2016-05-15: qty 1

## 2016-05-15 MED ORDER — APIXABAN 5 MG PO TABS: 5.0000 mg | ORAL_TABLET | Freq: Two times a day (BID) | ORAL | 0 refills | Status: DC

## 2016-05-15 MED ORDER — BACITRACIN ZINC 500 UNIT/GM EX OINT
TOPICAL_OINTMENT | CUTANEOUS | Status: AC
  Filled 2016-05-15: qty 0.9

## 2016-05-15 MED ORDER — OXYCODONE HCL 5 MG PO TABS
10.0000 mg | ORAL_TABLET | Freq: Once | ORAL | Status: AC
  Administered 2016-05-15: 10 mg via ORAL
  Filled 2016-05-15: qty 2

## 2016-05-15 NOTE — ED Notes (Signed)
Unsuccessful blood draw x2 

## 2016-05-15 NOTE — ED Notes (Signed)
Patient transported to Ultrasound 

## 2016-05-15 NOTE — ED Provider Notes (Signed)
[facility] Emergency Department Provider Note   ____________________________________________   First MD Initiated Contact with Patient 05/15/16 2001     (approximate)  I have reviewed the triage vital signs and the nursing notes.   HISTORY  Chief Complaint Leg swelling   HPI Edgar [name] is a 70 y.o. male with hypertension, hyperlipidemia, diabetes, discharge on 04/19/2016 after management of a left hip fracture as well as subsequent development of PE now prescribed eliquis who presents for evaluation of bilateral lower extremity edema, gradual onset, constant, moderate, no modifying factors. He reports that he sees a swelling mostly in his ankles. It is been ongoing for almost a week. Contrary to the triage note, the patient reports to me that he has not had any shortness of breath. I questioned him several times and he adamantly denied any shortness of breath. He also denies any chest pain. No vomiting, diarrhea, fevers or chills. No numbness or weakness in the legs. He reports that since he left rehabilitation approximately one week ago, he has not had his eliquis because he is waiting on approval from his insurance company. He reports his hip is healing well, he is doing well with physical therapy.   Past Medical History:  Diagnosis Date  . Diabetes mellitus without complication ([place])   . Essential hypertension, benign 12/23/2015  . Hx of tobacco use, presenting hazards to health 12/23/2015   Quit prior to 1997  . Hyperlipidemia 12/23/2015  . Hypertension   . Obesity 12/23/2015    Patient Active Problem List   Diagnosis Date Noted  . Encounter for central line placement   . SOB (shortness of breath)   . Closed left hip fracture ([place]) 04/13/2016  . Hip fracture ([place]) 04/13/2016  . Hypercalcemia 01/05/2016  . Diabetes ([place]) 01/05/2016  . Sinusitis, acute, maxillary 12/23/2015  . Essential hypertension, benign 12/23/2015  . Hyperlipidemia 12/23/2015    . Hx of tobacco use, presenting hazards to health 12/23/2015  . Obesity 12/23/2015  . Type II diabetes mellitus, uncontrolled ([place]) 01/25/2015    Past Surgical History:  Procedure Laterality Date  . EYE SURGERY    . INTRAMEDULLARY (IM) NAIL INTERTROCHANTERIC Left 04/14/2016   Procedure: INTRAMEDULLARY (IM) NAIL INTERTROCHANTRIC;  Surgeon: Corky Mull, MD;  Location: [facility] ORS;  Service: Orthopedics;  Laterality: Left;    Prior to Admission medications   Medication Sig Start Date End Date Taking? Authorizing Provider  acetaminophen (TYLENOL) 500 MG tablet Take 500 mg by mouth every 6 (six) hours as needed.   Yes Historical Provider, MD  alum & mag hydroxide-simeth (MAALOX/MYLANTA) 200-200-20 MG/5ML suspension Take 30 mLs by mouth every 4 (four) hours as needed for indigestion or heartburn. 04/19/16  Yes Demetrios Loll, MD  amLODipine (NORVASC) 5 MG tablet TAKE 1 TABLET EVERY DAY 12/22/15  Yes Ashok Norris, MD  apixaban (ELIQUIS) 5 MG TABS tablet Take 2 tablets (10 mg total) by mouth 2 (two) times daily. 04/19/16  Yes Demetrios Loll, MD  aspirin EC 81 MG tablet Take 81 mg by mouth daily.   Yes Historical Provider, MD  atorvastatin (LIPITOR) 40 MG tablet TAKE 1 TABLET BY MOUTH AT BEDTIME 03/14/16  Yes Arnetha Courser, MD  cholecalciferol (VITAMIN D) 1000 units tablet Take 1,000 Units by mouth 2 (two) times daily.    Yes Historical Provider, MD  Fish Oil-Cholecalciferol (FISH OIL + D3) 1200-1000 MG-UNIT CAPS Take 1,200 mg by mouth daily.   Yes Historical Provider, MD  insulin regular (HUMULIN R)  100 units/mL injection Inject 0.11 mLs (11 Units total) into the skin 3 (three) times daily before meals. This replaces NOVOLOG (this is short-acting insulin) 01/10/16  Yes Arnetha Courser, MD  lactulose (CHRONULAC) 10 GM/15ML solution Take 45 mLs (30 g total) by mouth 2 (two) times daily. 04/19/16  Yes Demetrios Loll, MD  LANTUS SOLOSTAR 100 UNIT/ML Solostar Pen INJECT 56 UNITS SUBCUTANEOUSLY ONCE A DAY 04/11/16  Yes Arnetha Courser, MD  losartan (COZAAR) 100 MG tablet Take 1 tablet (100 mg total) by mouth daily. 03/30/16  Yes Arnetha Courser, MD  oxyCODONE-acetaminophen (PERCOCET/ROXICET) 5-325 MG tablet Take 1 tablet by mouth every 6 (six) hours as needed for moderate pain. 04/19/16  Yes Demetrios Loll, MD  pantoprazole (PROTONIX) 40 MG tablet Take 1 tablet (40 mg total) by mouth 2 (two) times daily. 04/19/16  Yes Demetrios Loll, MD  bisacodyl (DULCOLAX) 10 MG suppository Place 1 suppository (10 mg total) rectally daily as needed for moderate constipation. 04/19/16   Demetrios Loll, MD    Allergies Review of patient's allergies indicates no known allergies.  Family History  Problem Relation Age of Onset  . Cancer Mother   . Diabetes Mother   . Heart disease Father     Social History Social History  Substance Use Topics  . Smoking status: Former Research scientist (life sciences)  . Smokeless tobacco: Never Used  . Alcohol use No    Review of Systems Constitutional: No fever/chills Eyes: No visual changes. ENT: No sore throat. Cardiovascular: Denies chest pain. Respiratory: Denies shortness of breath. Gastrointestinal: No abdominal pain.  No nausea, no vomiting.  No diarrhea.  No constipation. Genitourinary: Negative for dysuria. Musculoskeletal: Negative for back pain. Skin: Negative for rash. Neurological: Negative for headaches, focal weakness or numbness.  10-point ROS otherwise negative.  ____________________________________________   PHYSICAL EXAM:  Vitals:   05/15/16 2130 05/15/16 2230 05/15/16 2300 05/15/16 2330  BP: 121/82 134/85 (!) 141/83 131/85  Pulse: 66 67 66 75  Resp: 18 (!) 22 19 17   Temp:      TempSrc:      SpO2: 97% 97% 100% 100%  Weight:        VITAL SIGNS: ED Triage Vitals  Enc Vitals Group     BP 05/15/16 1840 114/66     Pulse Rate 05/15/16 1840 72     Resp 05/15/16 1840 20     Temp 05/15/16 1840 97.9 F (36.6 C)     Temp Source 05/15/16 1840 Oral     SpO2 05/15/16 1840 95 %     Weight 05/15/16 1841  272 lb (123.4 kg)     Height --      Head Circumference --      Peak Flow --      Pain Score --      Pain Loc --      Pain Edu? --      Excl. in [place]? --     Constitutional: Alert and oriented. Well appearing and in no acute distress. Eyes: Conjunctivae are normal. PERRL. EOMI. Head: Atraumatic. Nose: No congestion/rhinnorhea. Mouth/Throat: Mucous membranes are moist.  Oropharynx non-erythematous. Neck: No stridor. Supple without meningismus. Cardiovascular: Normal rate, regular rhythm. Grossly normal heart sounds.  Good peripheral circulation. Respiratory: Normal respiratory effort.  No retractions. Lungs CTAB. Gastrointestinal: Soft and nontender. No distention.  No CVA tenderness. Genitourinary: Deferred Musculoskeletal: Mild bilateral nonpitting edema of the lower extremities. 2+ DP pulse bilaterally, wiggles the toes. Incision the left hip is healing well without  any complication including no surrounding erythema, no warmth, no induration, no drainage. No calf swelling, tenderness or asymmetry. Neurologic:  Normal speech and language. No gross focal neurologic deficits are appreciated. No gait instability. Skin:  Skin is warm, dry. Small stage I pressure ulcer in the right buttocks. No rash noted. Psychiatric: Mood and affect are normal. Speech and behavior are normal.  ____________________________________________   LABS (all labs ordered are listed, but only abnormal results are displayed)  Labs Reviewed  CBC - Abnormal; Notable for the following:       Result Value   RBC 3.63 (*)    Hemoglobin 10.6 (*)    HCT 31.2 (*)    RDW 15.2 (*)    All other components within normal limits  COMPREHENSIVE METABOLIC PANEL - Abnormal; Notable for the following:    Calcium 10.5 (*)    Albumin 3.3 (*)    All other components within normal limits  TROPONIN I  BRAIN NATRIURETIC PEPTIDE   ____________________________________________  EKG  ED ECG REPORT I, Joanne Gavel, the  attending physician, personally viewed and interpreted this ECG.   Date: 05/15/2016  EKG Time:18:49  Rate: 77  Rhythm: normal sinus rhythm  Axis: normal  Intervals:first-degree A-V block   ST&T Change: No acute ST elevation or acute ST depression.  ____________________________________________  RADIOLOGY  Venous Doppler ultrasound of bilateral lower extremities   IMPRESSION:  1. No evidence of deep venous thrombosis.  2. Soft tissue edema noted at both calves.       CXR IMPRESSION:  Mild bibasilar scarring versus atelectasis. Otherwise no active  disease in the chest.     ____________________________________________   PROCEDURES  Procedure(s) performed: None  Procedures  Critical Care performed: No  ____________________________________________   INITIAL IMPRESSION / ASSESSMENT AND PLAN / ED COURSE  Pertinent labs & imaging results that were available during my care of the patient were reviewed by me and considered in my medical decision making (see chart for details).  Edgar [name] is a 70 y.o. male with hypertension, hyperlipidemia, diabetes, discharge on 04/19/2016 after management of a left hip fracture as well as subsequent development of PE now prescribed eliquis who presents for evaluation of bilateral lower extremity edema. On exam, he is well-appearing and in no acute distress. Vital signs stable and he is afebrile. Plus DP pulse bilaterally, no concern for acute vascular compromise. He does have nonpitting edema bilateral lower extremities which are nontender. He adamantly denies any chest pain or difficulty breathing however shortness of breath protocols labs were ordered in triage and are pending. We'll obtain venous Doppler ultrasound to rule out DVT. Reassess for disposition.  ----------------------------------------- 11:53 PM on 05/15/2016 -----------------------------------------  EKG reassuring. CBC shows chronic stable anemia, hemoglobin 10.6 is  improved from prior. Unremarkable CMP, negative troponin. Normal BNP. Chest x-ray clear. Venous Doppler ultrasound is negative for DVT. Peripheral edema, discussed treatment with elevation of the legs, compression stockings. With regards to his elliquis, I have provided him with a coupon for free 30 day supply of elliquis so that he can have his medications while his insurance approval process is ongoing. I discussed that he should follow-up with his primary care doctor soon as possible, we discussed return precautions and he is comfortable with the discharge plan. DC home.  Clinical Course     ____________________________________________   FINAL CLINICAL IMPRESSION(S) / ED DIAGNOSES  Final diagnoses:  Swelling  Peripheral edema      NEW MEDICATIONS STARTED DURING THIS VISIT:  New Prescriptions   No medications on file     Note:  This document was prepared using Dragon voice recognition software and may include unintentional dictation errors.    Joanne Gavel, MD 05/15/16 [phone]

## 2016-05-15 NOTE — ED Notes (Signed)
Bactrim applied to pre-existing bedsore ~ 1/2" diameter on left upper buttock sacral area and sacral pad applied.

## 2016-05-15 NOTE — Telephone Encounter (Signed)
Edgar [name] from [place]: Just got home from rehab due to femur fracture. he has severe swelling and shortness of breath. Edgar [name] is highly concerned. States that he should not have shortness of breath nor swelling due to the fall. Please advise.  [phone]

## 2016-05-15 NOTE — ED Triage Notes (Signed)
Pt post hip surgery last week and now having bilateral leg swelling and shortness of breath with exertion. Swelling noted to bilateral ankles.

## 2016-05-15 NOTE — Telephone Encounter (Signed)
Called and got in contact with wife and told her to take patient to ER.

## 2016-05-15 NOTE — ED Notes (Signed)
Pt sitting in w/c in triage with no distress noted; reports recent left hip fx with surgery and subsequent PE; just released from rehab; reports SHOB, swelling in LE(s); st home PT this am told him to come to the ED for swelling and "supposed to be taking a pill for swelling and blood thinner but hasn't gotten it yet"; noted pt with unsuccessful attempts for labs; charge nurse notified and pt taken immed to room 13, placed in gown and on card monitor; acuity level changed

## 2016-05-16 NOTE — Telephone Encounter (Signed)
error 

## 2016-05-18 ENCOUNTER — Ambulatory Visit (INDEPENDENT_AMBULATORY_CARE_PROVIDER_SITE_OTHER): Payer: Medicare Other | Admitting: Family Medicine

## 2016-05-18 ENCOUNTER — Encounter: Payer: Self-pay | Admitting: Family Medicine

## 2016-05-18 VITALS — BP 114/62 | HR 100 | Temp 97.5°F | Resp 16 | Wt 275.0 lb

## 2016-05-18 DIAGNOSIS — Z794 Long term (current) use of insulin: Secondary | ICD-10-CM

## 2016-05-18 DIAGNOSIS — E782 Mixed hyperlipidemia: Secondary | ICD-10-CM

## 2016-05-18 DIAGNOSIS — Z6837 Body mass index (BMI) 37.0-37.9, adult: Secondary | ICD-10-CM | POA: Diagnosis not present

## 2016-05-18 DIAGNOSIS — I2699 Other pulmonary embolism without acute cor pulmonale: Secondary | ICD-10-CM | POA: Diagnosis not present

## 2016-05-18 DIAGNOSIS — IMO0001 Reserved for inherently not codable concepts without codable children: Secondary | ICD-10-CM

## 2016-05-18 DIAGNOSIS — S72002D Fracture of unspecified part of neck of left femur, subsequent encounter for closed fracture with routine healing: Secondary | ICD-10-CM | POA: Diagnosis not present

## 2016-05-18 DIAGNOSIS — Z86711 Personal history of pulmonary embolism: Secondary | ICD-10-CM

## 2016-05-18 DIAGNOSIS — E6609 Other obesity due to excess calories: Secondary | ICD-10-CM

## 2016-05-18 DIAGNOSIS — E1165 Type 2 diabetes mellitus with hyperglycemia: Secondary | ICD-10-CM

## 2016-05-18 HISTORY — DX: Other pulmonary embolism without acute cor pulmonale: I26.99

## 2016-05-18 HISTORY — DX: Personal history of pulmonary embolism: Z86.711

## 2016-05-18 LAB — PHOSPHORUS: Phosphorus: 3.1 mg/dL (ref 2.1–4.3)

## 2016-05-18 LAB — LIPID PANEL
Cholesterol: 118 mg/dL — ABNORMAL LOW (ref 125–200)
HDL: 33 mg/dL — ABNORMAL LOW (ref >=40)
LDL Cholesterol: 54 mg/dL (ref <130)
Total CHOL/HDL Ratio: 3.6 Ratio (ref <=5.0)
Triglycerides: 154 mg/dL — ABNORMAL HIGH (ref <150)
VLDL: 31 mg/dL — ABNORMAL HIGH (ref <30)

## 2016-05-18 LAB — BASIC METABOLIC PANEL WITH GFR
BUN: 11 mg/dL (ref 7–25)
CO2: 24 mmol/L (ref 20–31)
Calcium: 10.2 mg/dL (ref 8.6–10.3)
Chloride: 102 mmol/L (ref 98–110)
Creat: 1.07 mg/dL (ref 0.70–1.25)
GFR, Est African American: 81 mL/min (ref >=60)
GFR, Est Non African American: 70 mL/min (ref >=60)
Glucose, Bld: 137 mg/dL — ABNORMAL HIGH (ref 65–99)
Potassium: 4.2 mmol/L (ref 3.5–5.3)
Sodium: 138 mmol/L (ref 135–146)

## 2016-05-18 LAB — HEMOGLOBIN A1C
Hgb A1c MFr Bld: 6.7 % — ABNORMAL HIGH (ref <5.7)
Mean Plasma Glucose: 146 mg/dL

## 2016-05-18 NOTE — Patient Instructions (Signed)
Continue your medicines Check your fingerstick blood sugars 3 times a day Call me if numbers are over 150 or over 200 during the rest of the day We'll get labs today

## 2016-05-18 NOTE — Assessment & Plan Note (Signed)
Check FSBS 3x a day; foot exam today; work on weight loss; check A1c

## 2016-05-18 NOTE — Assessment & Plan Note (Addendum)
Check BMP, phos; previous vit D was normal

## 2016-05-18 NOTE — Assessment & Plan Note (Signed)
Status post repair, currently undergoing therapy, has walker, making progress

## 2016-05-18 NOTE — Progress Notes (Signed)
BP 114/62   Pulse 100   Temp 97.5 F (36.4 C) (Oral)   Resp 16   Wt 275 lb (124.7 kg)   SpO2 97%   BMI 37.30 kg/m    Subjective:    Patient ID: Edgar [name], male    DOB: 1946-05-11, 70 y.o.   MRN: [id number]  HPI: Edgar [name] is a 70 y.o. male  Chief Complaint  Patient presents with  . Hospitalization Follow-up   Patient is here for hospital f/u He was admitted on September 8th and discharged to rehab on September 14th He broke his left hip; "19 stitches, bar and screws" says patient; Dr. Roland Rack was the surgeon He had a pulmonary embolism after surgery which was discovered in the hospital, treated with heparin drip and eliquis; venous doppler of lower extremity reported as negative He went to the ER on October 10th for leg swelling; they did bilateral dopplers and there was no blood clot No nosebleeds, no blood in urine or stool on the blood thinner; he is taking both aspirin and Eliquis, as per the discharge summary instructions He is down, but not depressed, just the fracture and rehab; he didn't want to be there; he says he has done "lots of hollering" with therapy; he is out of [place]; now doing home therapy to try to help him walk better; using two wheel rolling walker  Type 2 diabetes; he has not been checking his sugars at home, so he doesn't know how they're doing; he is on long-acting and short-acting insulin, again per discharge summary  HTN; excellent control today  He continues to struggle with obesity; he does not think he can lose even 10 pounds when discussed  Depression screen [facility] 2/9 05/18/2016 12/23/2015 07/04/2015 05/30/2015 01/25/2015  Decreased Interest 0 0 0 0 0  Down, Depressed, Hopeless 0 0 0 0 0  PHQ - 2 Score 0 0 0 0 0   Relevant past medical, surgical, family and social history reviewed Past Medical History:  Diagnosis Date  . Diabetes mellitus without complication ([place])   . Essential hypertension, benign 12/23/2015  . Hx of tobacco use,  presenting hazards to health 12/23/2015   Quit prior to 1997  . Hyperlipidemia 12/23/2015  . Hypertension   . Obesity 12/23/2015  . Pulmonary embolism ([place]) 05/18/2016   Hospitalization Sept 2017, following hip fracture   Past Surgical History:  Procedure Laterality Date  . EYE SURGERY    . INTRAMEDULLARY (IM) NAIL INTERTROCHANTERIC Left 04/14/2016   Procedure: INTRAMEDULLARY (IM) NAIL INTERTROCHANTRIC;  Surgeon: Corky Mull, MD;  Location: [facility] ORS;  Service: Orthopedics;  Laterality: Left;   Family History  Problem Relation Age of Onset  . Cancer Mother   . Diabetes Mother   . Heart disease Father    Social History  Substance Use Topics  . Smoking status: Former Research scientist (life sciences)  . Smokeless tobacco: Never Used  . Alcohol use No    Interim medical history since last visit reviewed. Allergies and medications reviewed  Review of Systems  HENT: Negative for nosebleeds.   Gastrointestinal: Negative for blood in stool.  Genitourinary: Negative for hematuria.  Skin:       Some itching, perhaps from pain medicine  Hematological: Does not bruise/bleed easily.   Per HPI unless specifically indicated above     Objective:    BP 114/62   Pulse 100   Temp 97.5 F (36.4 C) (Oral)   Resp 16   Wt 275 lb (124.7 kg)  SpO2 97%   BMI 37.30 kg/m   Wt Readings from Last 3 Encounters:  05/18/16 275 lb (124.7 kg)  05/15/16 272 lb (123.4 kg)  05/07/16 272 lb 4.8 oz (123.5 kg)    Physical Exam  Constitutional: He appears well-developed and well-nourished. No distress.  obese  HENT:  Head: Normocephalic and atraumatic.  Eyes: EOM are normal. No scleral icterus.  Neck: No thyromegaly present.  Cardiovascular: Normal rate and regular rhythm.   Heart rate under 100 at time of auscultation, closer to 92  Pulmonary/Chest: Effort normal and breath sounds normal. He has no wheezes.  Abdominal: Soft. Bowel sounds are normal. He exhibits no distension.  Musculoskeletal: He exhibits no edema  (trace bilateral lower ext and pedal edema).  Neurological: He is alert. Gait abnormal.  Ambulatory with two wheel rolling walker; limited ROM with left hip/leg, but patient is weight-bearing  Skin: Skin is warm and dry. No bruising, no ecchymosis and no petechiae noted. No pallor.  Psychiatric: He has a normal mood and affect. His behavior is normal. Judgment and thought content normal. His mood appears not anxious. He does not exhibit a depressed mood.   Diabetic Foot Form - Detailed   Diabetic Foot Exam - detailed Diabetic Foot exam was performed with the following findings:  Yes 05/18/2016 11:35 AM  Visual Foot Exam completed.:  Yes  Are the toenails ingrown?:  No Normal Range of Motion:  Yes Pulse Foot Exam completed.:  Yes  Right Dorsalis Pedis:  Present Left Dorsalis Pedis:  Present  Sensory Foot Exam Completed.:  Yes Swelling:  No Semmes-Weinstein Monofilament Test R Site 1-Great Toe:  Pos L Site 1-Great Toe:  Pos  R Site 4:  Pos L Site 4:  Pos  R Site 5:  Pos L Site 5:  Pos    Comments:  Bruised left great toenail     Results for orders placed or performed during the hospital encounter of 05/15/16  CBC  Result Value Ref Range   WBC 8.4 3.8 - 10.6 K/uL   RBC 3.63 (L) 4.40 - 5.90 MIL/uL   Hemoglobin 10.6 (L) 13.0 - 18.0 g/dL   HCT 31.2 (L) 40.0 - 52.0 %   MCV 86.0 80.0 - 100.0 fL   MCH 29.3 26.0 - 34.0 pg   MCHC 34.1 32.0 - 36.0 g/dL   RDW 15.2 (H) 11.5 - 14.5 %   Platelets 308 150 - 440 K/uL  Troponin I  Result Value Ref Range   Troponin I <0.03 <0.03 ng/mL  Comprehensive metabolic panel  Result Value Ref Range   Sodium 136 135 - 145 mmol/L   Potassium 3.8 3.5 - 5.1 mmol/L   Chloride 104 101 - 111 mmol/L   CO2 27 22 - 32 mmol/L   Glucose, Bld 99 65 - 99 mg/dL   BUN 13 6 - 20 mg/dL   Creatinine, Ser 1.17 0.61 - 1.24 mg/dL   Calcium 10.5 (H) 8.9 - 10.3 mg/dL   Total Protein 7.6 6.5 - 8.1 g/dL   Albumin 3.3 (L) 3.5 - 5.0 g/dL   AST 19 15 - 41 U/L   ALT 21 17  - 63 U/L   Alkaline Phosphatase 71 38 - 126 U/L   Total Bilirubin 0.5 0.3 - 1.2 mg/dL   GFR calc non Af Amer >60 >60 mL/min   GFR calc Af Amer >60 >60 mL/min   Anion gap 5 5 - 15  Brain natriuretic peptide  Result Value Ref Range  B Natriuretic Peptide 8.0 0.0 - 100.0 pg/mL      Assessment & Plan:   Problem List Items Addressed This Visit      Cardiovascular and Mediastinum   Pulmonary embolism ([place])    Continue Eliquis; patient denies bleeding, SHOB        Endocrine   Type II diabetes mellitus, uncontrolled ([place]) (Chronic)    Check FSBS 3x a day; foot exam today; work on weight loss; check A1c      Relevant Orders   Hemoglobin A1c     Musculoskeletal and Integument   Closed left hip fracture (HCC)    Status post repair, currently undergoing therapy, has walker, making progress        Other   Obesity - Primary    Encouraged patient to work on weight loss; he did not sound convinced that he could lose weight; I urged him to think about trying to lose just 10 pounds as a start if that sounds do-able      Hyperlipidemia    Check labs      Relevant Orders   Lipid panel   Hypercalcemia    Check BMP, phos; previous vit D was normal      Relevant Orders   BASIC METABOLIC PANEL WITH GFR   Phosphorus    Other Visit Diagnoses   None.     Follow up plan: Return in about 3 months (around 08/18/2016) for fasting labs and visit with Dr. Sanda Klein.  An after-visit summary was printed and given to the patient at [facility].  Please see the patient instructions which may contain other information and recommendations beyond what is mentioned above in the assessment and plan.  Meds ordered this encounter  Medications  . diphenhydrAMINE (BENADRYL) 12.5 MG/5ML liquid    Sig: Take by mouth 4 (four) times daily as needed.   Orders Placed This Encounter  Procedures  . Hemoglobin A1c  . Lipid panel  . BASIC METABOLIC PANEL WITH GFR  . Phosphorus

## 2016-05-18 NOTE — Assessment & Plan Note (Signed)
Continue Eliquis; patient denies bleeding, SHOB

## 2016-05-18 NOTE — Assessment & Plan Note (Signed)
Check labs 

## 2016-05-18 NOTE — Assessment & Plan Note (Signed)
Encouraged patient to work on weight loss; he did not sound convinced that he could lose weight; I urged him to think about trying to lose just 10 pounds as a start if that sounds do-able

## 2016-05-20 ENCOUNTER — Encounter: Payer: Self-pay | Admitting: Emergency Medicine

## 2016-05-20 ENCOUNTER — Emergency Department
Admission: EM | Admit: 2016-05-20 | Discharge: 2016-05-20 | Disposition: A | Discharge status: Home / Self Care | Payer: Medicare Other | Attending: Emergency Medicine | Admitting: Emergency Medicine

## 2016-05-20 DIAGNOSIS — Z791 Long term (current) use of non-steroidal anti-inflammatories (NSAID): Secondary | ICD-10-CM | POA: Insufficient documentation

## 2016-05-20 DIAGNOSIS — Z794 Long term (current) use of insulin: Secondary | ICD-10-CM | POA: Insufficient documentation

## 2016-05-20 DIAGNOSIS — Z7982 Long term (current) use of aspirin: Secondary | ICD-10-CM | POA: Diagnosis not present

## 2016-05-20 DIAGNOSIS — Z87891 Personal history of nicotine dependence: Secondary | ICD-10-CM | POA: Insufficient documentation

## 2016-05-20 DIAGNOSIS — E871 Hypo-osmolality and hyponatremia: Secondary | ICD-10-CM | POA: Diagnosis not present

## 2016-05-20 DIAGNOSIS — Z79899 Other long term (current) drug therapy: Secondary | ICD-10-CM | POA: Diagnosis not present

## 2016-05-20 DIAGNOSIS — E119 Type 2 diabetes mellitus without complications: Secondary | ICD-10-CM | POA: Insufficient documentation

## 2016-05-20 DIAGNOSIS — I10 Essential (primary) hypertension: Secondary | ICD-10-CM | POA: Insufficient documentation

## 2016-05-20 DIAGNOSIS — N3000 Acute cystitis without hematuria: Secondary | ICD-10-CM | POA: Diagnosis not present

## 2016-05-20 DIAGNOSIS — R339 Retention of urine, unspecified: Secondary | ICD-10-CM | POA: Diagnosis present

## 2016-05-20 LAB — URINALYSIS COMPLETE WITH MICROSCOPIC (ARMC ONLY)
Bilirubin Urine: NEGATIVE
Glucose, UA: NEGATIVE mg/dL
Ketones, ur: NEGATIVE mg/dL
Nitrite: NEGATIVE
Protein, ur: 30 mg/dL — AB
Specific Gravity, Urine: 1.014 (ref 1.005–1.030)
pH: 6 (ref 5.0–8.0)

## 2016-05-20 LAB — BASIC METABOLIC PANEL WITH GFR
Anion gap: 7 (ref 5–15)
BUN: 15 mg/dL (ref 6–20)
CO2: 24 mmol/L (ref 22–32)
Calcium: 10.1 mg/dL (ref 8.9–10.3)
Chloride: 101 mmol/L (ref 101–111)
Creatinine, Ser: 1.23 mg/dL (ref 0.61–1.24)
GFR calc Af Amer: >60 mL/min
GFR calc non Af Amer: 58 mL/min — ABNORMAL LOW
Glucose, Bld: 135 mg/dL — ABNORMAL HIGH (ref 65–99)
Potassium: 3.7 mmol/L (ref 3.5–5.1)
Sodium: 132 mmol/L — ABNORMAL LOW (ref 135–145)

## 2016-05-20 MED ORDER — CEPHALEXIN 500 MG PO CAPS
500.0000 mg | ORAL_CAPSULE | Freq: Once | ORAL | Status: AC
  Administered 2016-05-20: 500 mg via ORAL
  Filled 2016-05-20: qty 1

## 2016-05-20 MED ORDER — CEPHALEXIN 500 MG PO CAPS: 500.0000 mg | ORAL_CAPSULE | Freq: Four times a day (QID) | ORAL | 0 refills | Status: AC

## 2016-05-20 NOTE — ED Notes (Signed)
Bladder Scan; 52 ml

## 2016-05-20 NOTE — ED Provider Notes (Addendum)
[facility] Emergency Department Provider Note  ____________________________________________  Time seen: Approximately 9:17 PM  I have reviewed the triage vital signs and the nursing notes.   HISTORY  Chief Complaint Urinary Retention    HPI Edgar [name] is a 70 y.o. male with a history of HTN, HL, DM, but no known history of BPH or urinary retention, presenting for sensation of incomplete voiding. The patient reports that today he has urinated 3 times and felt like he was unable to get all of his urine out. He also had some discomfort in the suprapubic area. No hematuria, or dysuria. No fevers or chills.The patient reports that he has no symptoms at this time.   Past Medical History:  Diagnosis Date  . Diabetes mellitus without complication ([place])   . Essential hypertension, benign 12/23/2015  . Hx of tobacco use, presenting hazards to health 12/23/2015   Quit prior to 1997  . Hyperlipidemia 12/23/2015  . Hypertension   . Obesity 12/23/2015  . Pulmonary embolism ([place]) 05/18/2016   Hospitalization Sept 2017, following hip fracture    Patient Active Problem List   Diagnosis Date Noted  . Pulmonary embolism ([place]) 05/18/2016  . Encounter for central line placement   . SOB (shortness of breath)   . Closed left hip fracture ([place]) 04/13/2016  . Hip fracture ([place]) 04/13/2016  . Hypercalcemia 01/05/2016  . Diabetes ([place]) 01/05/2016  . Essential hypertension, benign 12/23/2015  . Hyperlipidemia 12/23/2015  . Hx of tobacco use, presenting hazards to health 12/23/2015  . Obesity 12/23/2015  . Type II diabetes mellitus, uncontrolled ([place]) 01/25/2015    Past Surgical History:  Procedure Laterality Date  . EYE SURGERY    . INTRAMEDULLARY (IM) NAIL INTERTROCHANTERIC Left 04/14/2016   Procedure: INTRAMEDULLARY (IM) NAIL INTERTROCHANTRIC;  Surgeon: Corky Mull, MD;  Location: [facility] ORS;  Service: Orthopedics;  Laterality: Left;  . TOTAL HIP ARTHROPLASTY       Current Outpatient Rx  . Order #: [id number] Class: Historical Med  . Order #: [id number] Class: Normal  . Order #: [id number] Class: Print  . Order #: [id number] Class: Historical Med  . Order #: [id number] Class: Normal  . Order #: [id number] Class: No Print  . Order #: [id number] Class: Print  . Order #: [id number] Class: Historical Med  . Order #: [id number] Class: Historical Med  . Order #: [id number] Class: Historical Med  . Order #: [id number] Class: Normal  . Order #: [id number] Class: No Print  . Order #: [id number] Class: Normal  . Order #: [id number] Class: Normal  . Order #: [id number] Class: Print  . Order #: [id number] Class: No Print    Allergies Review of patient's allergies indicates no known allergies.  Family History  Problem Relation Age of Onset  . Cancer Mother   . Diabetes Mother   . Heart disease Father     Social History Social History  Substance Use Topics  . Smoking status: Former Research scientist (life sciences)  . Smokeless tobacco: Never Used  . Alcohol use No    Review of Systems Constitutional: No fever/chills. Eyes: No visual changes. Cardiovascular: Denies chest pain. Denies palpitations. Respiratory: Denies shortness of breath.  No cough. Gastrointestinal: Positive suprapubic abdominal pain.  No nausea, no vomiting.  No diarrhea.  No constipation. Genitourinary: Negative for dysuria. Positive sensation of incomplete voiding. No hematuria. Musculoskeletal: Negative for back pain. Positive bilateral lower extremity swelling which is chronic and unchanged. Skin: Negative for rash. Neurological: Negative for headaches. No focal numbness, tingling or weakness.   10-point ROS otherwise negative.  ____________________________________________   PHYSICAL EXAM:  VITAL SIGNS: ED Triage Vitals [05/20/16 2036]  Enc Vitals Group     BP (!) 128/49     Pulse Rate 91     Resp 18     Temp 99.6 F (37.6 C)     Temp Source Oral     SpO2 95 %     Weight 275 lb (124.7 kg)     Height 6'  (1.829 m)     Head Circumference      Peak Flow      Pain Score 10     Pain Loc      Pain Edu?      Excl. in [place]?     Constitutional: Alert and oriented. Chronically ill appearing but in no acute distress. Answers questions appropriately. Eyes: Conjunctivae are normal.  EOMI. No scleral icterus. Head: Atraumatic. Nose: No congestion/rhinnorhea. Mouth/Throat: Mucous membranes are moist.  Neck: No stridor.  Supple.   Cardiovascular: Normal rate, regular rhythm. No murmurs, rubs or gallops.  Respiratory: Normal respiratory effort.  No accessory muscle use or retractions. Lungs CTAB.  No wheezes, rales or ronchi. Gastrointestinal: Obese. Soft, nontender and nondistended.  No guarding or rebound.  No peritoneal signs. Musculoskeletal: Positive bilateral symmetric lower extremity edema. Neurologic:  A&Ox3.  Speech is clear.  Face and smile are symmetric.  EOMI.  Moves all extremities well. Skin:  Skin is warm, dry and intact. No rash noted. Psychiatric: Mood and affect are normal. Speech and behavior are normal.  Normal judgement.  ____________________________________________   LABS (all labs ordered are listed, but only abnormal results are displayed)  Labs Reviewed  URINALYSIS COMPLETEWITH MICROSCOPIC ([place]) - Abnormal; Notable for the following:       Result Value   Color, Urine YELLOW (*)    APPearance CLEAR (*)    Hgb urine dipstick 2+ (*)    Protein, ur 30 (*)    Leukocytes, UA 3+ (*)    Bacteria, UA RARE (*)    Squamous Epithelial / LPF 0-5 (*)    All other components within normal limits  BASIC METABOLIC PANEL - Abnormal; Notable for the following:    Sodium 132 (*)    Glucose, Bld 135 (*)    GFR calc non Af Amer 58 (*)    All other components within normal limits  URINE CULTURE   ____________________________________________  EKG  Not indicated ____________________________________________  RADIOLOGY  No results  found.  ____________________________________________   PROCEDURES  Procedure(s) performed: None  Procedures  Critical Care performed: No ____________________________________________   INITIAL IMPRESSION / ASSESSMENT AND PLAN / ED COURSE  Pertinent labs & imaging results that were available during my care of the patient were reviewed by me and considered in my medical decision making (see chart for details).  70 y.o. male with multiple medical comorbidities presenting with sensation of incomplete voiding. It is possible the patient has UTI, but we'll also evaluate him for urinary retention. I will get a baseline creatinine to compare to previous as well. Post void residual has been ordered.  ----------------------------------------- 9:57 PM on 05/20/2016 -----------------------------------------  The patient does have signs of a urinary tract infection in his urine. I will give him his first dose of Keflex here and send him home with a 5 day course of Keflex. A urine culture sent by his primary care physician can follow-up.  His postvoid residual was negative for retention. His creatinine today is within normal limits, and he is mildly hyponatremic and can have this rechecked by  his primary care physician. Plan discharge at this time with close PMD follow-up. Return precautions were discussed. ____________________________________________  FINAL CLINICAL IMPRESSION(S) / ED DIAGNOSES  Final diagnoses:  Acute cystitis without hematuria  Hyponatremia    Clinical Course      NEW MEDICATIONS STARTED DURING THIS VISIT:  New Prescriptions   CEPHALEXIN (KEFLEX) 500 MG CAPSULE    Take 1 capsule (500 mg total) by mouth 4 (four) times daily.      Eula Listen, MD 05/20/16 2158    Eula Listen, MD 05/20/16 2200

## 2016-05-20 NOTE — Discharge Instructions (Signed)
Please drink plenty of fluid to stay well-hydrated and clear urinary tract infection. Please take the entire course of antibiotics, even if you're feeling better.  Make an appointment to follow up with your primary care physician for your urinary tract infection, as well as here slightly low sodium today.  Return to the emergency department if he develop severe pain, fever, nausea or vomiting, changes in mental status, or any other symptoms concerning to you.

## 2016-05-20 NOTE — ED Triage Notes (Signed)
Patient reports pain with urination, frequency and feeling like he is not emptying his bladder times one week. Patient reports that symptoms have become worse.

## 2016-05-22 ENCOUNTER — Telehealth: Payer: Self-pay | Admitting: Family Medicine

## 2016-05-22 NOTE — Telephone Encounter (Signed)
Edgar [name] Pt called wants to see about getting order for nursing for med and wound care.  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  Also wanted to make sure patient is to be taking eliquis and a daily asprin?

## 2016-05-22 NOTE — Telephone Encounter (Signed)
Please call Merry Proud at [facility] of behalf of patient. [phone]

## 2016-05-22 NOTE — Telephone Encounter (Signed)
Yes, please give okay for orders His discharge summary does list aspirin plus eliquis, but I'm very glad they caught that too and appreciate them double-checking; two heads are better than one Thank you

## 2016-05-23 ENCOUNTER — Telehealth: Payer: Self-pay

## 2016-05-23 LAB — URINE CULTURE: Culture: >=100000 — AB

## 2016-05-23 NOTE — Telephone Encounter (Signed)
Pt wife called asking to o over his recent lab work. Discuss the blood work results in details and asked her did she have any more concerns or questions; Pt was satisfied, end call.

## 2016-05-23 NOTE — Telephone Encounter (Signed)
Verbal order given  

## 2016-05-28 ENCOUNTER — Telehealth: Payer: Self-pay

## 2016-05-28 ENCOUNTER — Other Ambulatory Visit: Payer: Self-pay | Admitting: Family Medicine

## 2016-05-28 NOTE — Telephone Encounter (Signed)
I called home health nurse and explained that I am out of the office on vacation right now; she says they were going to have one of the other providers review the message and call her back, but she says she has not heard from anyone; I will call the patient I spoke w/patient, no blood in the urine now He had blood in the urine with his infection, that's all cleared up wife says STOP amlodipine and monitor BP at home Mild compression w/ace wrap Call w/any issues; we can add a fluid pill if needed once BP goes up a little

## 2016-05-28 NOTE — Telephone Encounter (Signed)
Edgar [name] from [facility] called wanted to let you know Edgar [name] is having swelling in bilateral ankles, and did go to the ER over the weekend for UTI also had blood in urine which has no resolved.  Nurses [phone]

## 2016-05-30 DIAGNOSIS — S72142S Displaced intertrochanteric fracture of left femur, sequela: Secondary | ICD-10-CM

## 2016-05-30 HISTORY — DX: Displaced intertrochanteric fracture of left femur, sequela: S72.142S

## 2016-06-22 ENCOUNTER — Other Ambulatory Visit: Payer: Self-pay | Admitting: Family Medicine

## 2016-06-22 NOTE — Telephone Encounter (Signed)
Last cr, K+, and A1c reviewed; Rxs approved

## 2016-06-25 ENCOUNTER — Encounter: Payer: Self-pay | Admitting: Family Medicine

## 2016-06-25 ENCOUNTER — Ambulatory Visit (INDEPENDENT_AMBULATORY_CARE_PROVIDER_SITE_OTHER): Payer: Medicare Other | Admitting: Family Medicine

## 2016-06-25 DIAGNOSIS — I2699 Other pulmonary embolism without acute cor pulmonale: Secondary | ICD-10-CM

## 2016-06-25 DIAGNOSIS — E1165 Type 2 diabetes mellitus with hyperglycemia: Secondary | ICD-10-CM

## 2016-06-25 DIAGNOSIS — E6609 Other obesity due to excess calories: Secondary | ICD-10-CM

## 2016-06-25 DIAGNOSIS — IMO0001 Reserved for inherently not codable concepts without codable children: Secondary | ICD-10-CM

## 2016-06-25 DIAGNOSIS — E782 Mixed hyperlipidemia: Secondary | ICD-10-CM | POA: Diagnosis not present

## 2016-06-25 DIAGNOSIS — Z6837 Body mass index (BMI) 37.0-37.9, adult: Secondary | ICD-10-CM

## 2016-06-25 DIAGNOSIS — Z794 Long term (current) use of insulin: Secondary | ICD-10-CM

## 2016-06-25 DIAGNOSIS — L602 Onychogryphosis: Secondary | ICD-10-CM | POA: Diagnosis not present

## 2016-06-25 DIAGNOSIS — I10 Essential (primary) hypertension: Secondary | ICD-10-CM | POA: Diagnosis not present

## 2016-06-25 DIAGNOSIS — B353 Tinea pedis: Secondary | ICD-10-CM | POA: Diagnosis not present

## 2016-06-25 HISTORY — DX: Onychogryphosis: L60.2

## 2016-06-25 HISTORY — DX: Tinea pedis: B35.3

## 2016-06-25 NOTE — Assessment & Plan Note (Signed)
Continue Eliquis; very important to continue for now

## 2016-06-25 NOTE — Assessment & Plan Note (Signed)
Keep working on weight loss 

## 2016-06-25 NOTE — Progress Notes (Signed)
BP 126/82 (BP Location: Left Arm, Patient Position: Sitting, Cuff Size: Normal)   Pulse 99   Temp 99.5 F (37.5 C) (Oral)   Resp 14   Ht 6' (1.829 m)   Wt 268 lb (121.6 kg)   SpO2 94%   BMI 36.35 kg/m    Subjective:    Patient ID: Edgar [name], male    DOB: 1946/04/28, 70 y.o.   MRN: [id number]  HPI: Edgar [name] is a 70 y.o. male  Chief Complaint  Patient presents with  . Follow-up    6 month F/U for A1c   Patient is here for six month follow-up and to get his A1c checked per staff  Type 2 diabetes; using insulin This morning his sugar was 94; he did not bring his glucose meter No fruit juice; he does eat white bread; his wife tries to give him wheat bread, but he gets white bread when she turns her back; he drinks coffee and one teaspoon of sugar;  His son trims his toenails Last a1c was just done on October 13th and it dropped from 8.5 to 6.7  Obesity; he has lost 8 pounds; "my wife don't give me nothing"; she says, you know you got to lose weight; she is limiting his portions  He needs pain medicine, Percocet; he has been taking tylenol, 500 mg; the percocet from Dr. Bridgett Larsson; most recent prescription for oxycodone from Dr. Frazier Richards; he sees an orthopaedist, Dr. Roland Rack; 4 screws and 19 stitches  He has a knot on his chest; not sure how long it's been there; doesn't hurt; now he thinks it's gone; just a little knot; couldn't find it  Last lipid panel reviewed; LDL 54, TG 154, total 118; on atorvastatin 40 mg daily Lab Results  Component Value Date   CHOL 118 (L) 05/18/2016   HDL 33 (L) 05/18/2016   LDLCALC 54 05/18/2016   TRIG 154 (H) 05/18/2016   CHOLHDL 3.6 05/18/2016   Hx of PE; on blood thinner; no bleeding from nose, gums, or colon  Depression screen [facility] 2/9 06/25/2016 05/18/2016 12/23/2015 07/04/2015 05/30/2015  Decreased Interest 0 0 0 0 0  Down, Depressed, Hopeless 0 0 0 0 0  PHQ - 2 Score 0 0 0 0 0   Relevant past medical, surgical, family and  social history reviewed Past Medical History:  Diagnosis Date  . Diabetes mellitus without complication ([place])   . Essential hypertension, benign 12/23/2015  . Hx of tobacco use, presenting hazards to health 12/23/2015   Quit prior to 1997  . Hyperlipidemia 12/23/2015  . Hypertension   . Obesity 12/23/2015  . Pulmonary embolism ([place]) 05/18/2016   Hospitalization Sept 2017, following hip fracture   Past Surgical History:  Procedure Laterality Date  . EYE SURGERY    . INTRAMEDULLARY (IM) NAIL INTERTROCHANTERIC Left 04/14/2016   Procedure: INTRAMEDULLARY (IM) NAIL INTERTROCHANTRIC;  Surgeon: Corky Mull, MD;  Location: [facility] ORS;  Service: Orthopedics;  Laterality: Left;  . TOTAL HIP ARTHROPLASTY     Family History  Problem Relation Age of Onset  . Cancer Mother   . Diabetes Mother   . Heart disease Father    Social History  Substance Use Topics  . Smoking status: Former Research scientist (life sciences)  . Smokeless tobacco: Never Used  . Alcohol use No   Interim medical history since last visit reviewed. Allergies and medications reviewed  Review of Systems Per HPI unless specifically indicated above     Objective:  BP 126/82 (BP Location: Left Arm, Patient Position: Sitting, Cuff Size: Normal)   Pulse 99   Temp 99.5 F (37.5 C) (Oral)   Resp 14   Ht 6' (1.829 m)   Wt 268 lb (121.6 kg)   SpO2 94%   BMI 36.35 kg/m   Wt Readings from Last 3 Encounters:  06/25/16 268 lb (121.6 kg)  05/20/16 275 lb (124.7 kg)  05/18/16 275 lb (124.7 kg)    Physical Exam  Constitutional: He appears well-developed and well-nourished. No distress.  HENT:  Head: Normocephalic and atraumatic.  Eyes: EOM are normal. No scleral icterus.  Neck: No thyromegaly present.  Cardiovascular: Normal rate and regular rhythm.   Pulmonary/Chest: Effort normal and breath sounds normal.  Abdominal: Soft. Bowel sounds are normal. He exhibits no distension.  Musculoskeletal: He exhibits no edema.  Neurological: Coordination  normal.  Skin: Skin is warm and dry. No pallor.  Dry scaly skin on soles and sides of both feet  Psychiatric: He has a normal mood and affect. His behavior is normal. Judgment and thought content normal.   Diabetic Foot Form - Detailed   Diabetic Foot Exam - detailed Diabetic Foot exam was performed with the following findings:  Yes 06/25/2016  9:41 AM  Visual Foot Exam completed.:  Yes  Are the toenails long?:  Yes Are the toenails thick?:  Yes (Comment: 2nd toenail right foot) Are the toenails ingrown?:  No Normal Range of Motion:  Yes Pulse Foot Exam completed.:  Yes  Right Dorsalis Pedis:  Present Left Dorsalis Pedis:  Present  Sensory Foot Exam Completed.:  Yes Swelling:  No Semmes-Weinstein Monofilament Test R Site 1-Great Toe:  Pos L Site 1-Great Toe:  Pos  R Site 4:  Pos L Site 4:  Pos  R Site 5:  Pos L Site 5:  Pos       Results for orders placed or performed during the hospital encounter of 05/20/16  Urine culture  Result Value Ref Range   Specimen Description URINE, RANDOM    Special Requests NONE    Culture >=100,000 COLONIES/mL ESCHERICHIA COLI (A)    Report Status 05/23/2016 FINAL    Organism ID, Bacteria ESCHERICHIA COLI (A)       Susceptibility   Escherichia coli - MIC*    AMPICILLIN <=2 SENSITIVE Sensitive     CEFAZOLIN <=4 SENSITIVE Sensitive     CEFTRIAXONE <=1 SENSITIVE Sensitive     CIPROFLOXACIN <=0.25 SENSITIVE Sensitive     GENTAMICIN <=1 SENSITIVE Sensitive     IMIPENEM <=0.25 SENSITIVE Sensitive     NITROFURANTOIN <=16 SENSITIVE Sensitive     TRIMETH/SULFA <=20 SENSITIVE Sensitive     AMPICILLIN/SULBACTAM <=2 SENSITIVE Sensitive     PIP/TAZO <=4 SENSITIVE Sensitive     Extended ESBL NEGATIVE Sensitive     * >=100,000 COLONIES/mL ESCHERICHIA COLI  Urinalysis complete, with microscopic- may I&O cath if menses  Result Value Ref Range   Color, Urine YELLOW (A) YELLOW   APPearance CLEAR (A) CLEAR   Glucose, UA NEGATIVE NEGATIVE mg/dL   Bilirubin  Urine NEGATIVE NEGATIVE   Ketones, ur NEGATIVE NEGATIVE mg/dL   Specific Gravity, Urine 1.014 1.005 - 1.030   Hgb urine dipstick 2+ (A) NEGATIVE   pH 6.0 5.0 - 8.0   Protein, ur 30 (A) NEGATIVE mg/dL   Nitrite NEGATIVE NEGATIVE   Leukocytes, UA 3+ (A) NEGATIVE   RBC / HPF 6-30 0 - 5 RBC/hpf   WBC, UA 6-30 0 - 5 WBC/hpf  Bacteria, UA RARE (A) NONE SEEN   Squamous Epithelial / LPF 0-5 (A) NONE SEEN   Mucous PRESENT   Basic metabolic panel  Result Value Ref Range   Sodium 132 (L) 135 - 145 mmol/L   Potassium 3.7 3.5 - 5.1 mmol/L   Chloride 101 101 - 111 mmol/L   CO2 24 22 - 32 mmol/L   Glucose, Bld 135 (H) 65 - 99 mg/dL   BUN 15 6 - 20 mg/dL   Creatinine, Ser 1.23 0.61 - 1.24 mg/dL   Calcium 10.1 8.9 - 10.3 mg/dL   GFR calc non Af Amer 58 (L) >60 mL/min   GFR calc Af Amer >60 >60 mL/min   Anion gap 7 5 - 15      Assessment & Plan:   Problem List Items Addressed This Visit      Cardiovascular and Mediastinum   Pulmonary embolism (HCC)    Continue Eliquis; very important to continue for now      Essential hypertension, benign (Chronic)    Well-controlled; limit salt        Endocrine   Type II diabetes mellitus, uncontrolled (HCC) (Chronic)    Limit portions, work on weight loss; foot exam by MD; offered podiatry referral; it sounds like he has an appt; referral entered        Musculoskeletal and Integument   Tinea pedis    encouraged him to use tolnaftate daily for about a month, explained infection      Onychogryphosis    Refer to podiatrist for nail care        Other   Obesity    Keep working on weight loss      Hyperlipidemia    Try to limit saturated fats; and continue statin         Follow up plan: Return in about 3 months (around 09/25/2016) for diabetes and cholesterol, with fasting labs.  An after-visit summary was printed and given to the patient at [facility].  Please see the patient instructions which may contain other information and  recommendations beyond what is mentioned above in the assessment and plan.  No orders of the defined types were placed in this encounter.   No orders of the defined types were placed in this encounter.

## 2016-06-25 NOTE — Assessment & Plan Note (Signed)
Limit portions, work on weight loss; foot exam by MD; offered podiatry referral; it sounds like he has an appt; referral entered

## 2016-06-25 NOTE — Assessment & Plan Note (Signed)
Try to limit saturated fats; and continue statin

## 2016-06-25 NOTE — Assessment & Plan Note (Signed)
Refer to podiatrist for nail care

## 2016-06-25 NOTE — Assessment & Plan Note (Signed)
encouraged him to use tolnaftate daily for about a month, explained infection

## 2016-06-25 NOTE — Patient Instructions (Addendum)
Please get tolnaftate and put on the feet every day for about one month Please do ask your orthopaedist (hip doctor) about pain management for your hip Your most recent A1c was only 6.7 in October, down from 8.5; keep up the great job Check out the information at [employer] entitled "Nutrition for Weight Loss: What You Need to Know about Fad Diets" Try to lose between 1-2 pounds per week by taking in fewer calories and burning off more calories You can succeed by limiting portions, limiting foods dense in calories and fat, becoming more active, and drinking 8 glasses of water a day (64 ounces) Don't skip meals, especially breakfast, as skipping meals may alter your metabolism Do not use over-the-counter weight loss pills or gimmicks that claim rapid weight loss A healthy BMI (or body mass index) is between 18.5 and 24.9 You can calculate your ideal BMI at the [employer] website [employer] Check feet every night, and I can refer you to a foot specialist about your nails if you would like

## 2016-06-25 NOTE — Assessment & Plan Note (Signed)
Well-controlled; limit salt 

## 2016-07-02 ENCOUNTER — Other Ambulatory Visit: Payer: Self-pay | Admitting: Family Medicine

## 2016-07-02 NOTE — Telephone Encounter (Signed)
Last sgpt and lipids reviewed; Rx approved 

## 2016-07-07 ENCOUNTER — Other Ambulatory Visit: Payer: Self-pay | Admitting: Family Medicine

## 2016-09-11 ENCOUNTER — Telehealth: Payer: Self-pay | Admitting: Family Medicine

## 2016-09-11 ENCOUNTER — Encounter: Payer: Self-pay | Admitting: Family Medicine

## 2016-09-11 ENCOUNTER — Ambulatory Visit (INDEPENDENT_AMBULATORY_CARE_PROVIDER_SITE_OTHER): Payer: Medicare Other | Admitting: Family Medicine

## 2016-09-11 ENCOUNTER — Ambulatory Visit
Admission: RE | Admit: 2016-09-11 | Discharge: 2016-09-11 | Disposition: A | Discharge status: Home / Self Care | Payer: Medicare Other | Source: Ambulatory Visit | Attending: Family Medicine | Admitting: Family Medicine

## 2016-09-11 DIAGNOSIS — I10 Essential (primary) hypertension: Secondary | ICD-10-CM | POA: Diagnosis not present

## 2016-09-11 DIAGNOSIS — E119 Type 2 diabetes mellitus without complications: Secondary | ICD-10-CM | POA: Diagnosis not present

## 2016-09-11 DIAGNOSIS — I719 Aortic aneurysm of unspecified site, without rupture: Secondary | ICD-10-CM | POA: Insufficient documentation

## 2016-09-11 DIAGNOSIS — I712 Thoracic aortic aneurysm, without rupture, unspecified: Secondary | ICD-10-CM

## 2016-09-11 DIAGNOSIS — I700 Atherosclerosis of aorta: Secondary | ICD-10-CM | POA: Diagnosis not present

## 2016-09-11 DIAGNOSIS — I2782 Chronic pulmonary embolism: Secondary | ICD-10-CM

## 2016-09-11 DIAGNOSIS — J9811 Atelectasis: Secondary | ICD-10-CM | POA: Diagnosis not present

## 2016-09-11 DIAGNOSIS — E6609 Other obesity due to excess calories: Secondary | ICD-10-CM | POA: Diagnosis not present

## 2016-09-11 DIAGNOSIS — Z6839 Body mass index (BMI) 39.0-39.9, adult: Secondary | ICD-10-CM

## 2016-09-11 DIAGNOSIS — IMO0001 Reserved for inherently not codable concepts without codable children: Secondary | ICD-10-CM

## 2016-09-11 DIAGNOSIS — K760 Fatty (change of) liver, not elsewhere classified: Secondary | ICD-10-CM | POA: Diagnosis not present

## 2016-09-11 DIAGNOSIS — Z794 Long term (current) use of insulin: Secondary | ICD-10-CM | POA: Diagnosis not present

## 2016-09-11 HISTORY — DX: Aortic aneurysm of unspecified site, without rupture: I71.9

## 2016-09-11 LAB — POCT I-STAT CREATININE: Creatinine, Ser: 0.8 mg/dL (ref 0.61–1.24)

## 2016-09-11 MED ORDER — IOPAMIDOL (ISOVUE-370) INJECTION 76%
75.0000 mL | Freq: Once | INTRAVENOUS | Status: AC | PRN
  Administered 2016-09-11: 75 mL via INTRAVENOUS

## 2016-09-11 MED ORDER — WARFARIN SODIUM 3 MG PO TABS: 3.0000 mg | ORAL_TABLET | Freq: Every day | ORAL | 0 refills | Status: DC

## 2016-09-11 NOTE — Assessment & Plan Note (Signed)
Controlled today 

## 2016-09-11 NOTE — Assessment & Plan Note (Signed)
Last A1c reviewed; no hypoglycemic episodes; I do not believe his diagnosis of diabetes and use of insulin pose a threat to driving; forms completed

## 2016-09-11 NOTE — Telephone Encounter (Signed)
I talked with patient; explained tiny focus of PE on scan Will want to anti-coagulate for 3 months (provoked PE after surgery) Start coumadin tomorrow; he can't get it today Take the medicine in the afternoon, between 4-6 pm Come in on Monday for bloodwork (in house INR) Over the weekend, go to ER for any bad bruising or bleeding or worst headache of life or abd pain Offered time for questions Encouraged him to talk to his pharmacist about this drug, lots of information about this

## 2016-09-11 NOTE — Telephone Encounter (Signed)
Pt states that you where going to give him samples for blood thinners. Also he is needing his test results. Please return call to discuss this. [phone] it is okay to leave detailed message on his voicemail

## 2016-09-11 NOTE — Assessment & Plan Note (Signed)
Explained to patient that his BMI is just 0.25 points away from being morbidly obese, and it is so important that he work on weight loss; urged him to take this seriously and try to lose weight

## 2016-09-11 NOTE — Assessment & Plan Note (Addendum)
Noted on previous CT scan; urged patient to work on weight loss

## 2016-09-11 NOTE — Progress Notes (Signed)
BP 124/82   Pulse 95   Temp 98.4 F (36.9 C) (Oral)   Resp 16   Ht 5\' 10"  (1.778 m)   Wt 277 lb (125.6 kg)   SpO2 96%   BMI 39.75 kg/m    Subjective:    Patient ID: Edgar [name], male    DOB: July 20, 1946, 71 y.o.   MRN: [id number]  HPI: Edgar [name] is a 71 y.o. male  Chief Complaint  Patient presents with  . Follow-up    Needs DMV papperwork completed   Patient is here for [facility] paperwork He has insulin-dependent diabetes; he denies any episodes of low blood sugars Patient had a PE in October after hip surgery; he stopped his anti-coagulation medicine; he never started it actually; he did take it while he was in the hospital and at the rehab facility He says the medicine was $400 (Eliquis) He says he couldn't afford it, so he was going to get something over-the-counter The pharmacist told him they couldn't change the medicine When I reviewed the CT scan with him, he did not sound like he was familiar with the thoracic aortic aneurysm on his scan; he denies any chest pain; he denies [facility] He uses a cane, given his arthritis He has already seen his eye doctor for that part of his DMV paperwork  Depression screen [facility] 2/9 09/11/2016 06/25/2016 05/18/2016 12/23/2015 07/04/2015  Decreased Interest 0 0 0 0 0  Down, Depressed, Hopeless 0 0 0 0 0  PHQ - 2 Score 0 0 0 0 0   Relevant past medical, surgical, family and social history reviewed Past Medical History:  Diagnosis Date  . Diabetes mellitus without complication ([place])   . Essential hypertension, benign 12/23/2015  . Hx of tobacco use, presenting hazards to health 12/23/2015   Quit prior to 1997  . Hyperlipidemia 12/23/2015  . Hypertension   . Obesity 12/23/2015  . Pulmonary embolism ([place]) 05/18/2016   Hospitalization Sept 2017, following hip fracture   Past Surgical History:  Procedure Laterality Date  . EYE SURGERY    . INTRAMEDULLARY (IM) NAIL INTERTROCHANTERIC Left 04/14/2016   Procedure: INTRAMEDULLARY (IM) NAIL  INTERTROCHANTRIC;  Surgeon: Corky Mull, MD;  Location: [facility] ORS;  Service: Orthopedics;  Laterality: Left;  . TOTAL HIP ARTHROPLASTY     Family History  Problem Relation Age of Onset  . Cancer Mother   . Diabetes Mother   . Heart disease Father    Social History  Substance Use Topics  . Smoking status: Former Research scientist (life sciences)  . Smokeless tobacco: Never Used  . Alcohol use No   Interim medical history since last visit reviewed. Allergies and medications reviewed  Review of Systems Per HPI unless specifically indicated above     Objective:    BP 124/82   Pulse 95   Temp 98.4 F (36.9 C) (Oral)   Resp 16   Ht 5\' 10"  (1.778 m)   Wt 277 lb (125.6 kg)   SpO2 96%   BMI 39.75 kg/m   Wt Readings from Last 3 Encounters:  09/11/16 277 lb (125.6 kg)  06/25/16 268 lb (121.6 kg)  05/20/16 275 lb (124.7 kg)    Physical Exam  Constitutional: He appears well-developed and well-nourished. No distress.  obese  HENT:  Head: Normocephalic and atraumatic.  Eyes: EOM are normal. No scleral icterus.  Neck: No thyromegaly present.  Cardiovascular: Normal rate and regular rhythm.   Pulmonary/Chest: Effort normal and breath sounds normal.  Abdominal: Soft. Bowel  sounds are normal. He exhibits no distension.  Musculoskeletal: He exhibits no edema.  Neurological: Coordination normal.  Skin: Skin is warm and dry. No pallor.  Dry scaly skin on soles and sides of both feet  Psychiatric: He has a normal mood and affect. His behavior is normal. Judgment and thought content normal.   Diabetic Foot Form - Detailed   Diabetic Foot Exam - detailed Diabetic Foot exam was performed with the following findings:  Yes 09/11/2016  2:00 PM  Visual Foot Exam completed.:  Yes  Are the toenails ingrown?:  No Normal Range of Motion:  Yes Pulse Foot Exam completed.:  Yes  Right Dorsalis Pedis:  Present Left Dorsalis Pedis:  Present  Sensory Foot Exam Completed.:  Yes Swelling:  No Semmes-Weinstein Monofilament  Test R Site 1-Great Toe:  Pos L Site 1-Great Toe:  Pos  R Site 4:  Pos L Site 4:  Pos  R Site 5:  Pos L Site 5:  Pos    Comments:  Bunions bilaterally     Results for orders placed or performed during the hospital encounter of 05/20/16  Urine culture  Result Value Ref Range   Specimen Description URINE, RANDOM    Special Requests NONE    Culture >=100,000 COLONIES/mL ESCHERICHIA COLI (A)    Report Status 05/23/2016 FINAL    Organism ID, Bacteria ESCHERICHIA COLI (A)       Susceptibility   Escherichia coli - MIC*    AMPICILLIN <=2 SENSITIVE Sensitive     CEFAZOLIN <=4 SENSITIVE Sensitive     CEFTRIAXONE <=1 SENSITIVE Sensitive     CIPROFLOXACIN <=0.25 SENSITIVE Sensitive     GENTAMICIN <=1 SENSITIVE Sensitive     IMIPENEM <=0.25 SENSITIVE Sensitive     NITROFURANTOIN <=16 SENSITIVE Sensitive     TRIMETH/SULFA <=20 SENSITIVE Sensitive     AMPICILLIN/SULBACTAM <=2 SENSITIVE Sensitive     PIP/TAZO <=4 SENSITIVE Sensitive     Extended ESBL NEGATIVE Sensitive     * >=100,000 COLONIES/mL ESCHERICHIA COLI  Urinalysis complete, with microscopic- may I&O cath if menses  Result Value Ref Range   Color, Urine YELLOW (A) YELLOW   APPearance CLEAR (A) CLEAR   Glucose, UA NEGATIVE NEGATIVE mg/dL   Bilirubin Urine NEGATIVE NEGATIVE   Ketones, ur NEGATIVE NEGATIVE mg/dL   Specific Gravity, Urine 1.014 1.005 - 1.030   Hgb urine dipstick 2+ (A) NEGATIVE   pH 6.0 5.0 - 8.0   Protein, ur 30 (A) NEGATIVE mg/dL   Nitrite NEGATIVE NEGATIVE   Leukocytes, UA 3+ (A) NEGATIVE   RBC / HPF 6-30 0 - 5 RBC/hpf   WBC, UA 6-30 0 - 5 WBC/hpf   Bacteria, UA RARE (A) NONE SEEN   Squamous Epithelial / LPF 0-5 (A) NONE SEEN   Mucous PRESENT   Basic metabolic panel  Result Value Ref Range   Sodium 132 (L) 135 - 145 mmol/L   Potassium 3.7 3.5 - 5.1 mmol/L   Chloride 101 101 - 111 mmol/L   CO2 24 22 - 32 mmol/L   Glucose, Bld 135 (H) 65 - 99 mg/dL   BUN 15 6 - 20 mg/dL   Creatinine, Ser 1.23 0.61 -  1.24 mg/dL   Calcium 10.1 8.9 - 10.3 mg/dL   GFR calc non Af Amer 58 (L) >60 mL/min   GFR calc Af Amer >60 >60 mL/min   Anion gap 7 5 - 15      Assessment & Plan:   Problem List Items Addressed  This Visit      Cardiovascular and Mediastinum   Pulmonary embolism [facility])    Patient never took the blood thinner; will repeat CTA scan chest today; he has been on aspirin only since about mid-November      Relevant Orders   CT ANGIO CHEST AORTA W/CM &/OR WO/CM (Completed)   BUN+Creat   Essential hypertension, benign (Chronic)    Controlled today      Aneurysm of aorta (HCC)    Reviewed CT scan with him; refer to vascular      Relevant Orders   CT ANGIO CHEST AORTA W/CM &/OR WO/CM (Completed)   BUN+Creat   Ambulatory referral to Vascular Surgery     Digestive   Fatty liver    Noted on previous CT scan; urged patient to work on weight loss      Relevant Orders   BUN+Creat     Endocrine   Diabetes ([place])    Last A1c reviewed; no hypoglycemic episodes; I do not believe his diagnosis of diabetes and use of insulin pose a threat to driving; forms completed        Other   Obesity    Explained to patient that his BMI is just 0.25 points away from being morbidly obese, and it is so important that he work on weight loss; urged him to take this seriously and try to lose weight          Follow up plan: No Follow-up on file.  An after-visit summary was printed and given to the patient at [place].  Please see the patient instructions which may contain other information and recommendations beyond what is mentioned above in the assessment and plan.  No orders of the defined types were placed in this encounter.   Orders Placed This Encounter  Procedures  . CT ANGIO CHEST AORTA W/CM &/OR WO/CM  . BUN+Creat  . Ambulatory referral to Vascular Surgery

## 2016-09-11 NOTE — Assessment & Plan Note (Addendum)
Reviewed CT scan with him; refer to vascular

## 2016-09-11 NOTE — Patient Instructions (Addendum)
Please to lose 50 pounds this year Check out the information at familydoctor.org entitled "Nutrition for Weight Loss: What You Need to Know about Fad Diets" Try to lose between 1-2 pounds per week by taking in fewer calories and burning off more calories You can succeed by limiting portions, limiting foods dense in calories and fat, becoming more active, and drinking 8 glasses of water a day (64 ounces) Don't skip meals, especially breakfast, as skipping meals may alter your metabolism Do not use over-the-counter weight loss pills or gimmicks that claim rapid weight loss A healthy BMI (or body mass index) is between 18.5 and 24.9 You can calculate your ideal BMI at the [place] website [employer]Mon[employer]fr  Go today to the hospital for the CT scan and we'll decide if blood thinners more than aspirin are needed We'll finish your form after the scan Keep your appointment for later this month

## 2016-09-11 NOTE — Assessment & Plan Note (Addendum)
Patient never took the blood thinner; will repeat CTA scan chest today; he has been on aspirin only since about mid-November

## 2016-09-12 ENCOUNTER — Telehealth: Payer: Self-pay | Admitting: Family Medicine

## 2016-09-18 LAB — BUN+CREAT: BUN/Creatinine Ratio: 13.7 Ratio (ref 6–22)

## 2016-09-18 LAB — BUN: BUN: 13 mg/dL (ref 7–25)

## 2016-09-18 LAB — CREATININE, SERUM: Creat: 0.95 mg/dL (ref 0.70–1.25)

## 2016-09-19 ENCOUNTER — Other Ambulatory Visit: Payer: Self-pay | Admitting: Family Medicine

## 2016-09-21 NOTE — Telephone Encounter (Signed)
ERRENOUS °

## 2016-10-01 ENCOUNTER — Encounter: Payer: Self-pay | Admitting: Family Medicine

## 2016-10-01 ENCOUNTER — Ambulatory Visit (INDEPENDENT_AMBULATORY_CARE_PROVIDER_SITE_OTHER): Payer: Medicare Other | Admitting: Family Medicine

## 2016-10-01 VITALS — BP 142/84 | HR 98 | Temp 98.3°F | Resp 16 | Wt 275.4 lb

## 2016-10-01 DIAGNOSIS — E119 Type 2 diabetes mellitus without complications: Secondary | ICD-10-CM | POA: Diagnosis not present

## 2016-10-01 DIAGNOSIS — Z794 Long term (current) use of insulin: Secondary | ICD-10-CM

## 2016-10-01 DIAGNOSIS — I2782 Chronic pulmonary embolism: Secondary | ICD-10-CM

## 2016-10-01 DIAGNOSIS — E782 Mixed hyperlipidemia: Secondary | ICD-10-CM

## 2016-10-03 ENCOUNTER — Other Ambulatory Visit: Payer: Self-pay | Admitting: Family Medicine

## 2016-10-03 MED ORDER — INSULIN REGULAR HUMAN 100 UNIT/ML IJ SOLN: INTRAMUSCULAR | 3 refills | Status: DC

## 2016-10-03 MED ORDER — INSULIN GLARGINE 100 UNIT/ML SOLOSTAR PEN: 56.0000 [IU] | PEN_INJECTOR | Freq: Every day | SUBCUTANEOUS | 3 refills | Status: DC

## 2016-10-03 NOTE — Telephone Encounter (Signed)
Needs 90 day supply 

## 2016-10-03 NOTE — Telephone Encounter (Signed)
Left detailed voicemail with patient 

## 2016-10-03 NOTE — Telephone Encounter (Signed)
He'll want to get his Coumadin (warfarin) from the doctor who is managing his INR Insulin approved

## 2016-10-04 LAB — POCT INR: INR: 1.3

## 2016-10-04 MED ORDER — WARFARIN SODIUM 4 MG PO TABS
4.0000 mg | ORAL_TABLET | Freq: Every day | ORAL | 0 refills | Status: DC
Start: 2016-10-04 — End: 2017-02-16

## 2016-10-04 NOTE — Progress Notes (Signed)
BP (!) 142/84   Pulse 98   Temp 98.3 F (36.8 C) (Oral)   Resp 16   Wt 275 lb 6.4 oz (124.9 kg)   SpO2 93%   BMI 39.52 kg/m    Subjective:    Patient ID: Edgar [name], male    DOB: 1945-12-03, 71 y.o.   MRN: [id number]  HPI: Edgar [name] is a 71 y.o. male  Chief Complaint  Patient presents with  . Follow-up   Patient is here for a visit for multiple issues On coumadin for PE; overdue for INR No chest pain; he has been taking his coumadin without any problems No blood in the stool or urine Active as a crossing guard  Type 2 diabetes Blood sugars have been doing well Last A1c was 6.7 in October; no problems with his feet  High cholesterol; on statin Last cholesterol reviewed; October 2017; tries to limit meats; her daughter is a vegetarian and is trying to help him Lab Results  Component Value Date   CHOL 118 (L) 05/18/2016   HDL 33 (L) 05/18/2016   LDLCALC 54 05/18/2016   TRIG 154 (H) 05/18/2016   CHOLHDL 3.6 05/18/2016    Depression screen PHQ 2/9 10/01/2016 09/11/2016 06/25/2016 05/18/2016 12/23/2015  Decreased Interest 0 0 0 0 0  Down, Depressed, Hopeless 0 0 0 0 0  PHQ - 2 Score 0 0 0 0 0   Relevant past medical, surgical, family and social history reviewed Past Medical History:  Diagnosis Date  . Diabetes mellitus without complication ([place])   . Essential hypertension, benign 12/23/2015  . Hx of tobacco use, presenting hazards to health 12/23/2015   Quit prior to 1997  . Hyperlipidemia 12/23/2015  . Hypertension   . Obesity 12/23/2015  . Pulmonary embolism ([place]) 05/18/2016   Hospitalization Sept 2017, following hip fracture   Past Surgical History:  Procedure Laterality Date  . EYE SURGERY    . INTRAMEDULLARY (IM) NAIL INTERTROCHANTERIC Left 04/14/2016   Procedure: INTRAMEDULLARY (IM) NAIL INTERTROCHANTRIC;  Surgeon: Corky Mull, MD;  Location: [facility] ORS;  Service: Orthopedics;  Laterality: Left;  . TOTAL HIP ARTHROPLASTY     Family History  Problem  Relation Age of Onset  . Cancer Mother   . Diabetes Mother   . Heart disease Father   . Alcohol abuse Brother    Social History  Substance Use Topics  . Smoking status: Former Research scientist (life sciences)  . Smokeless tobacco: Never Used  . Alcohol use No   Interim medical history since last visit reviewed. Allergies and medications reviewed  Review of Systems Per HPI unless specifically indicated above     Objective:    BP (!) 142/84   Pulse 98   Temp 98.3 F (36.8 C) (Oral)   Resp 16   Wt 275 lb 6.4 oz (124.9 kg)   SpO2 93%   BMI 39.52 kg/m   Wt Readings from Last 3 Encounters:  10/01/16 275 lb 6.4 oz (124.9 kg)  09/11/16 277 lb (125.6 kg)  06/25/16 268 lb (121.6 kg)    Physical Exam  Constitutional: He appears well-developed and well-nourished. No distress.  obese  HENT:  Head: Normocephalic and atraumatic.  Eyes: EOM are normal. No scleral icterus.  Neck: No thyromegaly present.  Cardiovascular: Normal rate and regular rhythm.   Pulmonary/Chest: Effort normal and breath sounds normal.  Abdominal: Soft. Bowel sounds are normal. He exhibits no distension.  Musculoskeletal: He exhibits no edema.  Neurological: Coordination normal.  Skin: Skin  is warm and dry. No pallor.  Psychiatric: He has a normal mood and affect. His behavior is normal. Judgment and thought content normal.    Results for orders placed or performed in visit on 10/01/16  POCT INR  Result Value Ref Range   INR 1.3       Assessment & Plan:   Problem List Items Addressed This Visit      Cardiovascular and Mediastinum   Pulmonary embolism ([place]) - Primary (Chronic)   Relevant Medications   warfarin (COUMADIN) 4 MG tablet   Other Relevant Orders   POCT INR (Completed)     Endocrine   Diabetes ([place])    Due for recheck A1c in April; encouraged weight loss, healthy eating        Other   Hyperlipidemia    Continue statin; check lipids in April      Relevant Medications   warfarin (COUMADIN) 4 MG  tablet       Follow up plan: Return in about 9 days (around 10/10/2016) for INR only with CMA; April 14th or just after with Dr. Sanda Klein.  An after-visit summary was printed and given to the patient at [place].  Please see the patient instructions which may contain other information and recommendations beyond what is mentioned above in the assessment and plan.  Meds ordered this encounter  Medications  . warfarin (COUMADIN) 4 MG tablet    Sig: Take 1 tablet (4 mg total) by mouth daily.    Dispense:  90 tablet    Refill:  0    Orders Placed This Encounter  Procedures  . POCT INR

## 2016-10-04 NOTE — Patient Instructions (Addendum)
STOP the 3 warfarin (Coumadin) START taking 4 mg warfarin (Coumadin) once a day in the evening at 6 pm Return on Wednesday for recheck INR with Roselyn Reef or [employer] Return to see Dr. Sanda Klein and have fasting labs done in April Try to limit saturated fats in your diet (bologna, hot dogs, barbeque, cheeseburgers, hamburgers, steak, bacon, sausage, cheese, etc.) and get more fresh fruits, vegetables, and whole grains Try to lose another 5 pounds over the next month

## 2016-10-08 NOTE — Assessment & Plan Note (Signed)
Due for recheck A1c in April; encouraged weight loss, healthy eating

## 2016-10-08 NOTE — Assessment & Plan Note (Signed)
Continue statin; check lipids in April

## 2016-10-10 ENCOUNTER — Ambulatory Visit: Payer: Medicare Other

## 2016-10-10 DIAGNOSIS — I2782 Chronic pulmonary embolism: Secondary | ICD-10-CM

## 2016-10-10 LAB — POCT INR: INR: 1.5

## 2016-10-10 NOTE — Patient Instructions (Signed)
Return in 2 weeks for recheck.  Take 4mg  pill everyday except on mondays,wednesday and Friday take 1 and 1/2

## 2016-10-13 ENCOUNTER — Other Ambulatory Visit: Payer: Self-pay | Admitting: Family Medicine

## 2016-10-24 ENCOUNTER — Ambulatory Visit (INDEPENDENT_AMBULATORY_CARE_PROVIDER_SITE_OTHER): Payer: Medicare Other

## 2016-10-24 DIAGNOSIS — I2782 Chronic pulmonary embolism: Secondary | ICD-10-CM

## 2016-10-24 LAB — POCT INR
INR: 2.2
INR: 2.2
INR: 2.4

## 2016-11-07 ENCOUNTER — Ambulatory Visit (INDEPENDENT_AMBULATORY_CARE_PROVIDER_SITE_OTHER): Payer: Medicare Other

## 2016-11-07 DIAGNOSIS — I2782 Chronic pulmonary embolism: Secondary | ICD-10-CM

## 2016-11-07 LAB — POCT INR: INR: 3.4

## 2016-11-20 ENCOUNTER — Other Ambulatory Visit: Payer: Self-pay | Admitting: Family Medicine

## 2016-11-21 ENCOUNTER — Other Ambulatory Visit: Payer: Self-pay

## 2016-11-21 ENCOUNTER — Other Ambulatory Visit: Payer: Self-pay | Admitting: Family Medicine

## 2016-11-21 ENCOUNTER — Ambulatory Visit (INDEPENDENT_AMBULATORY_CARE_PROVIDER_SITE_OTHER): Payer: Medicare Other

## 2016-11-21 DIAGNOSIS — I2782 Chronic pulmonary embolism: Secondary | ICD-10-CM | POA: Diagnosis not present

## 2016-11-21 LAB — POCT INR: INR: 2.5

## 2016-11-21 MED ORDER — INSULIN PEN NEEDLE 31G X 5 MM MISC: 1 refills | Status: DC

## 2016-11-23 ENCOUNTER — Other Ambulatory Visit: Payer: Self-pay

## 2016-11-23 ENCOUNTER — Telehealth: Payer: Self-pay

## 2016-11-23 NOTE — Telephone Encounter (Signed)
Spoke with pharmacist ; dr lada sent insulin syringe refill to [facility] on 11/20/2016 but they did not received it. Did a verbal order.

## 2016-11-30 ENCOUNTER — Other Ambulatory Visit: Payer: Medicare Other

## 2016-12-04 ENCOUNTER — Other Ambulatory Visit: Payer: Self-pay | Admitting: Family Medicine

## 2016-12-05 NOTE — Telephone Encounter (Signed)
Please ask patient to come fasting for his appointment with me on May 4th; thank you

## 2016-12-05 NOTE — Telephone Encounter (Signed)
Left detailed vociemail 

## 2016-12-07 ENCOUNTER — Other Ambulatory Visit: Payer: Self-pay

## 2016-12-07 ENCOUNTER — Telehealth: Payer: Self-pay

## 2016-12-07 ENCOUNTER — Other Ambulatory Visit (INDEPENDENT_AMBULATORY_CARE_PROVIDER_SITE_OTHER): Payer: Medicare Other | Admitting: Family Medicine

## 2016-12-07 ENCOUNTER — Encounter: Payer: Self-pay | Admitting: Family Medicine

## 2016-12-07 VITALS — BP 142/90 | HR 67 | Temp 97.9°F | Resp 14 | Wt 284.9 lb

## 2016-12-07 DIAGNOSIS — I2782 Chronic pulmonary embolism: Secondary | ICD-10-CM | POA: Diagnosis not present

## 2016-12-07 DIAGNOSIS — Z9889 Other specified postprocedural states: Secondary | ICD-10-CM

## 2016-12-07 DIAGNOSIS — Z860100 Personal history of colon polyps, unspecified: Secondary | ICD-10-CM

## 2016-12-07 DIAGNOSIS — E119 Type 2 diabetes mellitus without complications: Secondary | ICD-10-CM

## 2016-12-07 DIAGNOSIS — Z5181 Encounter for therapeutic drug level monitoring: Secondary | ICD-10-CM

## 2016-12-07 DIAGNOSIS — I10 Essential (primary) hypertension: Secondary | ICD-10-CM

## 2016-12-07 DIAGNOSIS — Z1211 Encounter for screening for malignant neoplasm of colon: Secondary | ICD-10-CM

## 2016-12-07 DIAGNOSIS — K625 Hemorrhage of anus and rectum: Secondary | ICD-10-CM

## 2016-12-07 DIAGNOSIS — Z794 Long term (current) use of insulin: Secondary | ICD-10-CM

## 2016-12-07 DIAGNOSIS — Z1159 Encounter for screening for other viral diseases: Secondary | ICD-10-CM

## 2016-12-07 DIAGNOSIS — E782 Mixed hyperlipidemia: Secondary | ICD-10-CM

## 2016-12-07 HISTORY — DX: Personal history of colon polyps, unspecified: Z86.0100

## 2016-12-07 HISTORY — DX: Personal history of colonic polyps: Z86.010

## 2016-12-07 HISTORY — DX: Personal history of colonic polyps: Z98.890

## 2016-12-07 LAB — TSH: TSH: 3 mIU/L (ref 0.40–4.50)

## 2016-12-07 LAB — MICROALBUMIN / CREATININE URINE RATIO
Creatinine, Urine: 155 mg/dL (ref 20–370)
Microalb Creat Ratio: 194 mcg/mg creat — ABNORMAL HIGH (ref <30)
Microalb, Ur: 30 mg/dL

## 2016-12-07 LAB — POCT INR: INR: 1.8

## 2016-12-07 NOTE — Telephone Encounter (Signed)
error 

## 2016-12-07 NOTE — Assessment & Plan Note (Signed)
Will refer back to GI for evaluation and possible colonoscopy; hx of polypectomy x 12 per patient

## 2016-12-07 NOTE — Assessment & Plan Note (Signed)
Gaining weight; check TSH; drink water; limit portions; work on 10 pounds of weight loss over next 2 months

## 2016-12-07 NOTE — Assessment & Plan Note (Signed)
A little above target today; try to work on weight loss, DASH guidelines

## 2016-12-07 NOTE — Progress Notes (Signed)
BP (!) 142/90 (BP Location: Left Arm, Patient Position: Sitting, Cuff Size: Normal)   Pulse 67   Temp 97.9 F (36.6 C) (Oral)   Resp 14   Wt 284 lb 14.4 oz (129.2 kg)   SpO2 96%   BMI 40.88 kg/m    Subjective:    Patient ID: Edgar [name], male    DOB: 11-22-1945, 71 y.o.   MRN: [id number]  HPI: Edgar [name] is a 71 y.o. male  Chief Complaint  Patient presents with  . Follow-up    LABS  . Medication Refill  . Rectal Bleeding    Pt states he wipe and had some bright red blood. Pt is concern that he might be due for a colonscopy    HPI HTN; not quite to goal today; has gained significant weight since last visit Type 2 diabetes; using insulin; no problems with feet Lab Results  Component Value Date   HGBA1C 6.7 (H) 05/18/2016   Bright red blood per rectum yesterday Polpyectomy x 12; he says he is due for another he thinks (colonoscopy) PE; no shortness of breath at all; started coumadin therapy again on Feb 6th; other than the rectal area High cholesterol; taking statin  Depression screen [facility] 2/9 10/01/2016 09/11/2016 06/25/2016 05/18/2016 12/23/2015  Decreased Interest 0 0 0 0 0  Down, Depressed, Hopeless 0 0 0 0 0  PHQ - 2 Score 0 0 0 0 0   Relevant past medical, surgical, family and social history reviewed Past Medical History:  Diagnosis Date  . Diabetes mellitus without complication ([place])   . Essential hypertension, benign 12/23/2015  . History of colonoscopy with polypectomy 12/07/2016   Polypectomy x 12 per patient  . Hx of tobacco use, presenting hazards to health 12/23/2015   Quit prior to 1997  . Hyperlipidemia 12/23/2015  . Hypertension   . Obesity 12/23/2015  . Pulmonary embolism ([place]) 05/18/2016   Hospitalization Sept 2017, following hip fracture   Past Surgical History:  Procedure Laterality Date  . EYE SURGERY    . INTRAMEDULLARY (IM) NAIL INTERTROCHANTERIC Left 04/14/2016   Procedure: INTRAMEDULLARY (IM) NAIL INTERTROCHANTRIC;  Surgeon: Corky Mull,  MD;  Location: [facility] ORS;  Service: Orthopedics;  Laterality: Left;  . TOTAL HIP ARTHROPLASTY     family history includes Alcohol abuse in his brother; Cancer in his mother; Diabetes in his mother; Heart disease in his father.  No colon cancer to his knowledge  Social History   Social History  . Marital status: Married    Spouse name: N/A  . Number of children: N/A  . Years of education: N/A   Occupational History  . Not on file.   Social History Main Topics  . Smoking status: Former Research scientist (life sciences)  . Smokeless tobacco: Never Used  . Alcohol use No  . Drug use: No  . Sexual activity: Not on file   Other Topics Concern  . Not on file   Social History Narrative  . No narrative on file    Interim medical history since last visit reviewed. Allergies and medications reviewed  Review of Systems Per HPI unless specifically indicated above     Objective:    BP (!) 142/90 (BP Location: Left Arm, Patient Position: Sitting, Cuff Size: Normal)   Pulse 67   Temp 97.9 F (36.6 C) (Oral)   Resp 14   Wt 284 lb 14.4 oz (129.2 kg)   SpO2 96%   BMI 40.88 kg/m   Wt Readings from Last  3 Encounters:  12/07/16 284 lb 14.4 oz (129.2 kg)  10/01/16 275 lb 6.4 oz (124.9 kg)  09/11/16 277 lb (125.6 kg)    Physical Exam  Constitutional: He appears well-developed and well-nourished. No distress.  Morbidly obese; weight gain 9+ pounds over the last 2-1/2 months  HENT:  Head: Normocephalic and atraumatic.  Eyes: EOM are normal. No scleral icterus.  Neck: No thyromegaly present.  Cardiovascular: Normal rate and regular rhythm.   Pulmonary/Chest: Effort normal and breath sounds normal.  Abdominal: Soft. Bowel sounds are normal. He exhibits no distension.  Musculoskeletal: He exhibits no edema.  Neurological: Coordination normal.  Skin: Skin is warm and dry. No pallor.  Psychiatric: He has a normal mood and affect. His behavior is normal. Judgment and thought content normal. His mood appears  not anxious. He does not exhibit a depressed mood.   Diabetic Foot Form - Detailed   Diabetic Foot Exam - detailed Diabetic Foot exam was performed with the following findings:  Yes 12/07/2016  9:35 AM  Visual Foot Exam completed.:  Yes  Are the toenails ingrown?:  No Normal Range of Motion:  Yes Pulse Foot Exam completed.:  Yes  Right Dorsalis Pedis:  Present Left Dorsalis Pedis:  Present  Sensory Foot Exam Completed.:  Yes Swelling:  No Semmes-Weinstein Monofilament Test R Site 1-Great Toe:  Pos L Site 1-Great Toe:  Pos  R Site 4:  Pos L Site 4:  Pos  R Site 5:  Pos L Site 5:  Pos    Comments:  Left great toenail bruised     Results for orders placed or performed in visit on 12/07/16  POCT INR  Result Value Ref Range   INR 1.8       Assessment & Plan:   Problem List Items Addressed This Visit      Cardiovascular and Mediastinum   Pulmonary embolism (HCC) (Chronic)    Completes his 3 months of anticoagulation on May 6th; take 1.5 pills today, 1 pill Sat, 1 pill Sunday then STOP coumadin      Relevant Orders   POCT INR (Completed)   Essential hypertension, benign (Chronic)    A little above target today; try to work on weight loss, DASH guidelines        Endocrine   Diabetes ([place]) - Primary    Foot exam today by MD looked great      Relevant Orders   Hemoglobin A1c   Microalbumin / creatinine urine ratio     Other   Morbid obesity ([place])    Gaining weight; check TSH; drink water; limit portions; work on 10 pounds of weight loss over next 2 months      Relevant Orders   TSH   Medication monitoring encounter    Check labs today      Relevant Orders   COMPLETE METABOLIC PANEL WITH GFR   Hyperlipidemia    Check lipids today; limit saturated fats; more greens, whole grains      Relevant Orders   Lipid panel   History of colonoscopy with polypectomy    Refer to GI      BRBPR (bright red blood per rectum)    Will refer back to GI for evaluation and  possible colonoscopy; hx of polypectomy x 12 per patient       Other Visit Diagnoses    Need for hepatitis C screening test       Relevant Orders   Hepatitis C Antibody  Follow up plan: Return in about 3 months (around 03/09/2017) for twenty minute follow-up with fasting labs.  An after-visit summary was printed and given to the patient at [place].  Please see the patient instructions which may contain other information and recommendations beyond what is mentioned above in the assessment and plan.  No orders of the defined types were placed in this encounter.   Orders Placed This Encounter  Procedures  . Hepatitis C Antibody  . COMPLETE METABOLIC PANEL WITH GFR  . Hemoglobin A1c  . Lipid panel  . Microalbumin / creatinine urine ratio  . TSH  . POCT INR

## 2016-12-07 NOTE — Telephone Encounter (Signed)
Gastroenterology Pre-Procedure Review  Request Date: 01/15/17 Requesting Physician: Dr. Vicente Males  PATIENT REVIEW QUESTIONS: The patient responded to the following health history questions as indicated:    1. Are you having any GI issues? no 2. Do you have a personal history of Polyps? no 3. Do you have a family history of Colon Cancer or Polyps? no 4. Diabetes Mellitus? yes (self) 5. Joint replacements in the past 12 months?yes (Hip surgery Edgar [name] Physician 04/2016) 6. Major health problems in the past 3 months?no 7. Any artificial heart valves, MVP, or defibrillator?no    MEDICATIONS & ALLERGIES:    Patient reports the following regarding taking any anticoagulation/antiplatelet therapy:   Plavix, Coumadin, Eliquis, Xarelto, Lovenox, Pradaxa, Brilinta, or Effient? yes (Pt is currently on Coumadin 4 mg is scheduled to stop Sun May 5th) Aspirin? yes (Baby Aspirin)  Patient confirms/reports the following medications:  Current Outpatient Prescriptions  Medication Sig Dispense Refill  . acetaminophen (TYLENOL) 500 MG tablet Take 500 mg by mouth every 6 (six) hours as needed.    Marland Kitchen amLODipine (NORVASC) 5 MG tablet Take 1 tablet (5 mg total) by mouth daily. 30 tablet 11  . aspirin EC 81 MG tablet Take 81 mg by mouth daily.    Marland Kitchen atorvastatin (LIPITOR) 40 MG tablet TAKE 1 TABLET BY MOUTH AT BEDTIME 30 tablet 0  . BD INSULIN SYRINGE ULTRAFINE 31G X 5/16" 0.3 ML MISC USE AS DIRECTED 4 TIMES DAILY 200 each 5  . cholecalciferol (VITAMIN D) 1000 units tablet Take 1,000 Units by mouth 2 (two) times daily.     . Fish Oil-Cholecalciferol (FISH OIL + D3) 1200-1000 MG-UNIT CAPS Take 1,200 mg by mouth daily.    . Insulin Pen Needle (B-D UF III MINI PEN NEEDLES) 31G X 5 MM MISC use as directed once a day with Lantus 100 each 1  . insulin regular (HUMULIN R) 250 units/2.65mL (100 units/mL) injection Inject eleven units into the skin three times a day before meals, or as directed 30 mL 3  . LANTUS SOLOSTAR 100  UNIT/ML Solostar Pen INJECT 56 UNITS INTO THE SKIN DAILY. OR AS DIRECTED BY YOUR DOCTOR 15 mL 1  . losartan (COZAAR) 100 MG tablet TAKE 1 TABLET (100 MG TOTAL) BY MOUTH DAILY. 90 tablet 1  . warfarin (COUMADIN) 4 MG tablet Take 1 tablet (4 mg total) by mouth daily. 90 tablet 0   No current facility-administered medications for this visit.     Patient confirms/reports the following allergies:  No Known Allergies  No orders of the defined types were placed in this encounter.   AUTHORIZATION INFORMATION Primary Insurance: 1D#: Group #:  Secondary Insurance: 1D#: Group #:  SCHEDULE INFORMATION: Date:  Time: Location:

## 2016-12-07 NOTE — Assessment & Plan Note (Addendum)
Completes his 3 months of anticoagulation on May 6th; take 1.5 pills today, 1 pill Sat, 1 pill Sunday then STOP coumadin

## 2016-12-07 NOTE — Patient Instructions (Addendum)
Stop taking the coumadin after your dose on Sunday May 6th; no more coumadin from Sunday forward  Please do see your eye doctor regularly, and have your eyes examined every year (or more often per his or her recommendation) Check your feet every night and let me know right away of any sores, infections, numbness, etc. Try to limit sweets, white bread, white rice, white potatoes  Check out the information at familydoctor.org entitled "Nutrition for Weight Loss: What You Need to Know about Fad Diets" Try to lose between 1-2 pounds per week by taking in fewer calories and burning off more calories You can succeed by limiting portions, limiting foods dense in calories and fat, becoming more active, and drinking 8 glasses of water a day (64 ounces) Don't skip meals, especially breakfast, as skipping meals may alter your metabolism Do not use over-the-counter weight loss pills or gimmicks that claim rapid weight loss A healthy BMI (or body mass index) is between 18.5 and 24.9 You can calculate your ideal BMI at the [place] website [employer]Mon[employer]fr

## 2016-12-07 NOTE — Assessment & Plan Note (Signed)
Refer to GI 

## 2016-12-07 NOTE — Assessment & Plan Note (Signed)
Check labs today.

## 2016-12-07 NOTE — Assessment & Plan Note (Signed)
Check lipids today; limit saturated fats; more greens, whole grains

## 2016-12-07 NOTE — Assessment & Plan Note (Signed)
Foot exam today by MD looked great

## 2016-12-08 LAB — COMPLETE METABOLIC PANEL WITH GFR
ALT: 13 U/L (ref 9–46)
AST: 17 U/L (ref 10–35)
Albumin: 4 g/dL (ref 3.6–5.1)
Alkaline Phosphatase: 87 U/L (ref 40–115)
BUN: 16 mg/dL (ref 7–25)
CO2: 21 mmol/L (ref 20–31)
Calcium: 10.1 mg/dL (ref 8.6–10.3)
Chloride: 108 mmol/L (ref 98–110)
Creat: 0.98 mg/dL (ref 0.70–1.25)
GFR, Est African American: >89 mL/min (ref >=60)
GFR, Est Non African American: 78 mL/min (ref >=60)
Glucose, Bld: 148 mg/dL — ABNORMAL HIGH (ref 65–99)
Potassium: 4.6 mmol/L (ref 3.5–5.3)
Sodium: 141 mmol/L (ref 135–146)
Total Bilirubin: 0.5 mg/dL (ref 0.2–1.2)
Total Protein: 7.4 g/dL (ref 6.1–8.1)

## 2016-12-08 LAB — LIPID PANEL
Cholesterol: 134 mg/dL (ref <200)
HDL: 36 mg/dL — ABNORMAL LOW (ref >40)
LDL Cholesterol: 83 mg/dL (ref <100)
Total CHOL/HDL Ratio: 3.7 Ratio (ref <5.0)
Triglycerides: 74 mg/dL (ref <150)
VLDL: 15 mg/dL (ref <30)

## 2016-12-08 LAB — HEMOGLOBIN A1C
Hgb A1c MFr Bld: 8 % — ABNORMAL HIGH (ref <5.7)
Mean Plasma Glucose: 183 mg/dL

## 2016-12-08 LAB — HEPATITIS C ANTIBODY: HCV Ab: NEGATIVE

## 2016-12-10 ENCOUNTER — Telehealth: Payer: Self-pay | Admitting: Gastroenterology

## 2016-12-10 NOTE — Telephone Encounter (Signed)
12/10/16 Spoke Micah D at [facility] and NO prior auth is required for Screening Colonoscopy [id number] / Z12.11.

## 2016-12-11 ENCOUNTER — Telehealth: Payer: Self-pay

## 2016-12-11 NOTE — Telephone Encounter (Signed)
-----   Message from Arnetha Courser, MD sent at 12/10/2016  4:47 PM EDT ----- Please let pt know that his A1c has gone up; goal is less than 7 and his is 8; that's equal to an average blood sugar of 180 and we want it under 150; increase the lantus from 56 units once a day to 60 units once a day, please update med list; HDL not high enough, so really try to lose weight; spilling less protein through kidneys, so that's good news; try to limit red meat; hep C is negative

## 2016-12-11 NOTE — Telephone Encounter (Signed)
Left voicemail to give me a call back regarding lab results.

## 2016-12-21 ENCOUNTER — Ambulatory Visit: Payer: Medicare Other | Admitting: Family Medicine

## 2016-12-22 ENCOUNTER — Other Ambulatory Visit: Payer: Self-pay | Admitting: Family Medicine

## 2016-12-22 NOTE — Telephone Encounter (Signed)
Reviewed last Cr and K+, Rx approved

## 2016-12-27 ENCOUNTER — Ambulatory Visit: Payer: Medicare Other | Admitting: Family Medicine

## 2016-12-30 ENCOUNTER — Other Ambulatory Visit: Payer: Self-pay | Admitting: Family Medicine

## 2017-01-14 ENCOUNTER — Encounter: Payer: Self-pay | Admitting: *Deleted

## 2017-01-15 ENCOUNTER — Ambulatory Visit
Admission: RE | Admit: 2017-01-15 | Discharge: 2017-01-15 | Disposition: A | Discharge status: Home / Self Care | Payer: Medicare Other | Source: Ambulatory Visit | Attending: Gastroenterology | Admitting: Gastroenterology

## 2017-01-15 ENCOUNTER — Encounter
Admission: RE | Disposition: A | Discharge status: Home / Self Care | Payer: Self-pay | Source: Ambulatory Visit | Attending: Gastroenterology

## 2017-01-15 ENCOUNTER — Ambulatory Visit: Payer: Medicare Other | Admitting: Anesthesiology

## 2017-01-15 ENCOUNTER — Encounter: Payer: Self-pay | Admitting: *Deleted

## 2017-01-15 DIAGNOSIS — Z86010 Personal history of colonic polyps: Secondary | ICD-10-CM | POA: Insufficient documentation

## 2017-01-15 DIAGNOSIS — D124 Benign neoplasm of descending colon: Secondary | ICD-10-CM | POA: Diagnosis not present

## 2017-01-15 DIAGNOSIS — D125 Benign neoplasm of sigmoid colon: Secondary | ICD-10-CM | POA: Diagnosis not present

## 2017-01-15 DIAGNOSIS — J449 Chronic obstructive pulmonary disease, unspecified: Secondary | ICD-10-CM | POA: Diagnosis not present

## 2017-01-15 DIAGNOSIS — D120 Benign neoplasm of cecum: Secondary | ICD-10-CM | POA: Insufficient documentation

## 2017-01-15 DIAGNOSIS — Z1211 Encounter for screening for malignant neoplasm of colon: Secondary | ICD-10-CM | POA: Diagnosis present

## 2017-01-15 DIAGNOSIS — Z7901 Long term (current) use of anticoagulants: Secondary | ICD-10-CM | POA: Diagnosis not present

## 2017-01-15 DIAGNOSIS — I10 Essential (primary) hypertension: Secondary | ICD-10-CM | POA: Diagnosis not present

## 2017-01-15 DIAGNOSIS — Z794 Long term (current) use of insulin: Secondary | ICD-10-CM | POA: Insufficient documentation

## 2017-01-15 DIAGNOSIS — E1151 Type 2 diabetes mellitus with diabetic peripheral angiopathy without gangrene: Secondary | ICD-10-CM | POA: Diagnosis not present

## 2017-01-15 DIAGNOSIS — Z87891 Personal history of nicotine dependence: Secondary | ICD-10-CM | POA: Insufficient documentation

## 2017-01-15 DIAGNOSIS — Z86711 Personal history of pulmonary embolism: Secondary | ICD-10-CM | POA: Diagnosis not present

## 2017-01-15 DIAGNOSIS — Z7982 Long term (current) use of aspirin: Secondary | ICD-10-CM | POA: Insufficient documentation

## 2017-01-15 DIAGNOSIS — K5730 Diverticulosis of large intestine without perforation or abscess without bleeding: Secondary | ICD-10-CM | POA: Diagnosis not present

## 2017-01-15 DIAGNOSIS — K640 First degree hemorrhoids: Secondary | ICD-10-CM

## 2017-01-15 DIAGNOSIS — K648 Other hemorrhoids: Secondary | ICD-10-CM | POA: Diagnosis not present

## 2017-01-15 DIAGNOSIS — Z96642 Presence of left artificial hip joint: Secondary | ICD-10-CM | POA: Insufficient documentation

## 2017-01-15 DIAGNOSIS — D122 Benign neoplasm of ascending colon: Secondary | ICD-10-CM

## 2017-01-15 DIAGNOSIS — D123 Benign neoplasm of transverse colon: Secondary | ICD-10-CM | POA: Insufficient documentation

## 2017-01-15 DIAGNOSIS — E785 Hyperlipidemia, unspecified: Secondary | ICD-10-CM | POA: Diagnosis not present

## 2017-01-15 HISTORY — PX: COLONOSCOPY WITH PROPOFOL: SHX5780

## 2017-01-15 LAB — GLUCOSE, CAPILLARY: Glucose-Capillary: 149 mg/dL — ABNORMAL HIGH (ref 65–99)

## 2017-01-15 SURGERY — COLONOSCOPY WITH PROPOFOL
Anesthesia: General

## 2017-01-15 MED ORDER — METHYLENE BLUE 0.5 % INJ SOLN
INTRAVENOUS | Status: DC | PRN
  Administered 2017-01-15: 1 mL via INTRADERMAL

## 2017-01-15 MED ORDER — PROPOFOL 500 MG/50ML IV EMUL
INTRAVENOUS | Status: AC
Start: 2017-01-15 — End: 2017-01-15
  Filled 2017-01-15: qty 50

## 2017-01-15 MED ORDER — PROPOFOL 10 MG/ML IV BOLUS
INTRAVENOUS | Status: DC | PRN
  Administered 2017-01-15: 20 mg via INTRAVENOUS
  Administered 2017-01-15: 70 mg via INTRAVENOUS

## 2017-01-15 MED ORDER — LIDOCAINE HCL (PF) 1 % IJ SOLN
2.0000 mL | Freq: Once | INTRAMUSCULAR | Status: AC
  Administered 2017-01-15: 0.3 mL via INTRADERMAL
  Filled 2017-01-15: qty 2

## 2017-01-15 MED ORDER — PROPOFOL 500 MG/50ML IV EMUL
INTRAVENOUS | Status: AC
  Filled 2017-01-15: qty 50

## 2017-01-15 MED ORDER — METHYLENE BLUE 0.5 % INJ SOLN
INTRAVENOUS | Status: AC
  Filled 2017-01-15: qty 10

## 2017-01-15 MED ORDER — SODIUM CHLORIDE 0.9 % IV SOLN
INTRAVENOUS | Status: DC
  Administered 2017-01-15: 1000 mL via INTRAVENOUS

## 2017-01-15 MED ORDER — LIDOCAINE HCL (CARDIAC) 20 MG/ML IV SOLN
INTRAVENOUS | Status: DC | PRN
  Administered 2017-01-15: 40 mg via INTRAVENOUS

## 2017-01-15 MED ORDER — LIDOCAINE HCL (PF) 2 % IJ SOLN
INTRAMUSCULAR | Status: AC
  Filled 2017-01-15: qty 2

## 2017-01-15 MED ORDER — PROPOFOL 500 MG/50ML IV EMUL
INTRAVENOUS | Status: DC | PRN
  Administered 2017-01-15: 150 ug/kg/min via INTRAVENOUS

## 2017-01-15 MED ORDER — GLYCOPYRROLATE 0.2 MG/ML IJ SOLN
INTRAMUSCULAR | Status: AC
  Filled 2017-01-15: qty 1

## 2017-01-15 NOTE — Transfer of Care (Signed)
Immediate Anesthesia Transfer of Care Note  Patient: Edgar [name]  Procedure(s) Performed: Procedure(s): COLONOSCOPY WITH PROPOFOL (N/A)  Patient Location: PACU  Anesthesia Type:General  Level of Consciousness: awake, alert  and oriented  Airway & Oxygen Therapy: Patient Spontanous Breathing and Patient connected to nasal cannula oxygen  Post-op Assessment: Report given to RN and Post -op Vital signs reviewed and stable  Post vital signs: Reviewed and stable  Last Vitals:  Vitals:   01/15/17 0917 01/15/17 0918  BP: (!) 88/59   Pulse: 77 74  Resp: 19 18  Temp: (!) 36 C     Last Pain:  Vitals:   01/15/17 0917  TempSrc: Tympanic         Complications: No apparent anesthesia complications

## 2017-01-15 NOTE — Anesthesia Preprocedure Evaluation (Signed)
Anesthesia Evaluation  Patient identified by MRN, date of birth, ID band  Reviewed: Allergy & Precautions, NPO status , Patient's Chart, lab work & pertinent test results  Airway Mallampati: II       Dental  (+) Teeth Intact   Pulmonary COPD, former smoker,     + decreased breath sounds      Cardiovascular Exercise Tolerance: Good hypertension, Pt. on medications + Peripheral Vascular Disease   Rhythm:Regular Rate:Normal     Neuro/Psych negative neurological ROS     GI/Hepatic negative GI ROS, Neg liver ROS,   Endo/Other  diabetes, Type 1, Insulin Dependent  Renal/GU      Musculoskeletal   Abdominal (+) + obese,   Peds  Hematology negative hematology ROS (+)   Anesthesia Other Findings   Reproductive/Obstetrics                             Anesthesia Physical Anesthesia Plan  ASA: III  Anesthesia Plan: General   Post-op Pain Management:    Induction: Intravenous  PONV Risk Score and Plan: 0  Airway Management Planned: Natural Airway  Additional Equipment:   Intra-op Plan:   Post-operative Plan:   Informed Consent: I have reviewed the patients History and Physical, chart, labs and discussed the procedure including the risks, benefits and alternatives for the proposed anesthesia with the patient or authorized representative who has indicated his/her understanding and acceptance.     Plan Discussed with: CRNA  Anesthesia Plan Comments:         Anesthesia Quick Evaluation

## 2017-01-15 NOTE — Anesthesia Postprocedure Evaluation (Signed)
Anesthesia Post Note  Patient: Edgar [name]  Procedure(s) Performed: Procedure(s) (LRB): COLONOSCOPY WITH PROPOFOL (N/A)  Patient location during evaluation: PACU Anesthesia Type: General Level of consciousness: awake Pain management: satisfactory to patient Cardiovascular status: blood pressure returned to baseline Anesthetic complications: no     Last Vitals:  Vitals:   01/15/17 0942 01/15/17 0948  BP:  135/87  Pulse: (!) 59 61  Resp: (!) 24 (!) 24  Temp:      Last Pain:  Vitals:   01/15/17 0927  TempSrc: Tympanic                 VAN STAVEREN,[name]

## 2017-01-15 NOTE — H&P (Signed)
Edgar Bellows MD [address] [place], [place] [address] Phone: [phone] Fax : [phone]  Primary Care Physician:  Edgar Courser, MD Primary Gastroenterologist:  Dr. Jonathon [name]   Pre-Procedure History & Physical: HPI:  Edgar [name] is a 71 y.o. male is here for an colonoscopy.   Past Medical History:  Diagnosis Date  . Diabetes mellitus without complication ([place])   . Essential hypertension, benign 12/23/2015  . History of colonoscopy with polypectomy 12/07/2016   Polypectomy x 12 per patient  . Hx of tobacco use, presenting hazards to health 12/23/2015   Quit prior to 1997  . Hyperlipidemia 12/23/2015  . Hypertension   . Obesity 12/23/2015  . Pulmonary embolism ([place]) 05/18/2016   Hospitalization Sept 2017, following hip fracture    Past Surgical History:  Procedure Laterality Date  . EYE SURGERY    . INTRAMEDULLARY (IM) NAIL INTERTROCHANTERIC Left 04/14/2016   Procedure: INTRAMEDULLARY (IM) NAIL INTERTROCHANTRIC;  Surgeon: Corky Mull, MD;  Location: [facility] ORS;  Service: Orthopedics;  Laterality: Left;  . TOTAL HIP ARTHROPLASTY      Prior to Admission medications   Medication Sig Start Date End Date Taking? Authorizing Provider  acetaminophen (TYLENOL) 500 MG tablet Take 500 mg by mouth every 6 (six) hours as needed.    [provider]  amLODipine (NORVASC) 5 MG tablet Take 1 tablet (5 mg total) by mouth daily. 09/19/16   Edgar Courser, MD  aspirin EC 81 MG tablet Take 81 mg by mouth daily.    [provider]  atorvastatin (LIPITOR) 40 MG tablet TAKE 1 TABLET BY MOUTH AT BEDTIME 12/05/16   Lada, Satira Anis, MD  BD INSULIN SYRINGE ULTRAFINE 31G X 5/16" 0.3 ML MISC USE AS DIRECTED 4 TIMES DAILY 11/20/16   Lada, Satira Anis, MD  cholecalciferol (VITAMIN D) 1000 units tablet Take 1,000 Units by mouth 2 (two) times daily.     [provider]  Fish Oil-Cholecalciferol (FISH OIL + D3) 1200-1000 MG-UNIT CAPS Take 1,200 mg by mouth daily.    [provider]  Insulin Pen Needle (B-D UF III MINI PEN NEEDLES) 31G X 5 MM MISC use as directed once a day with Lantus 11/21/16   Lada, Satira Anis, MD  insulin regular (HUMULIN R) 250 units/2.40mL (100 units/mL) injection Inject eleven units into the skin three times a day before meals, or as directed 10/03/16   Lada, Satira Anis, MD  LANTUS SOLOSTAR 100 UNIT/ML Solostar Pen INJECT 56 UNITS INTO THE SKIN DAILY. OR AS DIRECTED BY YOUR DOCTOR Patient taking differently: Inject 60 Units into the skin daily. Or as directed by your doctor 12/05/16   Edgar Courser, MD  losartan (COZAAR) 100 MG tablet TAKE 1 TABLET (100 MG TOTAL) BY MOUTH DAILY. 12/22/16   Edgar Courser, MD  warfarin (COUMADIN) 4 MG tablet Take 1 tablet (4 mg total) by mouth daily. Patient not taking: Reported on 01/15/2017 10/04/16   Edgar Courser, MD    Allergies as of 12/07/2016  . (No Known Allergies)    Family History  Problem Relation Age of Onset  . Cancer Mother   . Diabetes Mother   . Heart disease Father   . Alcohol abuse Brother     Social History   Social History  . Marital status: Married    Spouse name: N/A  . Number of children: N/A  . Years of education: N/A   Occupational History  . Not on file.  Social History Main Topics  . Smoking status: Former Research scientist (life sciences)  . Smokeless tobacco: Never Used  . Alcohol use No  . Drug use: No  . Sexual activity: Not on file   Other Topics Concern  . Not on file   Social History Narrative  . No narrative on file    Review of Systems: See HPI, otherwise negative ROS  Physical Exam: There were no vitals taken for this visit. General:   Alert,  pleasant and cooperative in NAD Head:  Normocephalic and atraumatic. Neck:  Supple; no masses or thyromegaly. Lungs:  Clear throughout to auscultation.    Heart:  Regular rate and rhythm. Abdomen:  Soft, nontender and nondistended. Normal bowel sounds, without guarding, and without rebound.   Neurologic:  Alert and   oriented x4;  grossly normal neurologically.  Impression/Plan: Edgar [name] is here for an colonoscopy to be performed for Screening colonoscopy average risk    Risks, benefits, limitations, and alternatives regarding  colonoscopy have been reviewed with the patient.  Questions have been answered.  All parties agreeable.   Edgar Bellows, MD  01/15/2017, 8:03 AM

## 2017-01-15 NOTE — Anesthesia Post-op Follow-up Note (Cosign Needed)
Anesthesia QCDR form completed.        

## 2017-01-15 NOTE — Op Note (Signed)
[facility] Gastroenterology Patient Name: Edgar [name] Procedure Date: 01/15/2017 8:30 AM MRN: [id number] Account #: [id number] Date of Birth: 04-Jun-1946 Admit Type: Outpatient Age: 71 Room: [facility] ENDO ROOM 1 Gender: Male Note Status: Finalized Procedure:            Colonoscopy Indications:          High risk colon cancer surveillance: Personal history                        of colonic polyps, Last colonoscopy: December 2015 Providers:            Jonathon Bellows MD, MD Referring MD:         Arnetha Courser (Referring MD) Medicines:            Monitored Anesthesia Care Complications:        No immediate complications. Procedure:            Pre-Anesthesia Assessment:                       - Prior to the procedure, a History and Physical was                        performed, and patient medications, allergies and                        sensitivities were reviewed. The patient's tolerance of                        previous anesthesia was reviewed.                       - The risks and benefits of the procedure and the                        sedation options and risks were discussed with the                        patient. All questions were answered and informed                        consent was obtained.                       - ASA Grade Assessment: III - A patient with severe                        systemic disease.                       After obtaining informed consent, the colonoscope was                        passed under direct vision. Throughout the procedure,                        the patient's blood pressure, pulse, and oxygen                        saturations were monitored continuously. The Olympus  CF-H180AL colonoscope ( S#: [id number] ) was introduced                        through the anus and advanced to the the cecum,                        identified by the appendiceal orifice, IC valve and                        transillumination.  The colonoscopy was performed with                        ease. The patient tolerated the procedure well. The                        quality of the bowel preparation was good. Findings:      The perianal and digital rectal examinations were normal.      Two sessile polyps were found in the cecum. The polyps were 3 to 5 mm in       size. These polyps were removed with a cold biopsy forceps. Resection       and retrieval were complete.      Two sessile polyps were found in the sigmoid colon and descending colon.       The polyps were 6 to 7 mm in size. These polyps were removed with a cold       snare. Resection and retrieval were complete.      A 5 mm polyp was found in the ascending colon. The polyp was sessile.       The polyp was removed with a cold biopsy forceps. Resection and       retrieval were complete.      A 12 mm polyp was found in the transverse colon. The polyp was sessile.       Preparations were made for mucosal resection. Saline with methylene blue       was injected to raise the lesion. Snare mucosal resection was performed.       Resection and retrieval were complete. To close a defect after mucosal       resection, two hemostatic clips were successfully placed. There was no       bleeding during, or at the end, of the procedure.      A few small-mouthed diverticula were found in the sigmoid colon.      Non-bleeding internal hemorrhoids were found during retroflexion. The       hemorrhoids were medium-sized and Grade I (internal hemorrhoids that do       not prolapse). Impression:           - Two 3 to 5 mm polyps in the cecum, removed with a                        cold biopsy forceps. Resected and retrieved.                       - Two 6 to 7 mm polyps in the sigmoid colon and in the                        descending colon, removed with a cold snare. Resected  and retrieved.                       - One 5 mm polyp in the ascending colon, removed  with a                        cold biopsy forceps. Resected and retrieved.                       - One 12 mm polyp in the transverse colon, removed with                        mucosal resection. Resected and retrieved. Clips were                        placed.                       - Diverticulosis in the sigmoid colon.                       - Non-bleeding internal hemorrhoids.                       - Mucosal resection was performed. Resection and                        retrieval were complete. Recommendation:       - Discharge patient to home (with escort).                       - Resume previous diet.                       - Continue present medications.                       - Await pathology results.                       - Repeat colonoscopy in 3 years for surveillance. Procedure Code(s):    --- Professional ---                       [phone], 33, Colonoscopy, flexible; with endoscopic                        mucosal resection                       [phone], Colonoscopy, flexible; with removal of tumor(s),                        polyp(s), or other lesion(s) by snare technique                       [id number], 67, Colonoscopy, flexible; with biopsy, single                        or multiple Diagnosis Code(s):    --- Professional ---                       Z86.010, Personal history of colonic polyps  D12.0, Benign neoplasm of cecum                       D12.5, Benign neoplasm of sigmoid colon                       D12.4, Benign neoplasm of descending colon                       D12.2, Benign neoplasm of ascending colon                       D12.3, Benign neoplasm of transverse colon (hepatic                        flexure or splenic flexure)                       K57.30, Diverticulosis of large intestine without                        perforation or abscess without bleeding                       K64.0, First degree hemorrhoids CPT copyright 2016 American Medical  Association. All rights reserved. The codes documented in this report are preliminary and upon coder review may  be revised to meet current compliance requirements. Jonathon Bellows, MD Jonathon Bellows MD, MD 01/15/2017 9:17:58 AM This report has been signed electronically. Number of Addenda: 0 Note Initiated On: 01/15/2017 8:30 AM Scope Withdrawal Time: 0 hours 28 minutes 49 seconds  Total Procedure Duration: 0 hours 34 minutes 46 seconds       [facility]

## 2017-01-16 ENCOUNTER — Encounter: Payer: Self-pay | Admitting: Gastroenterology

## 2017-01-16 LAB — SURGICAL PATHOLOGY

## 2017-01-21 ENCOUNTER — Other Ambulatory Visit: Payer: Self-pay

## 2017-01-21 ENCOUNTER — Encounter: Payer: Self-pay | Admitting: Gastroenterology

## 2017-02-09 ENCOUNTER — Other Ambulatory Visit: Payer: Self-pay | Admitting: Family Medicine

## 2017-02-11 NOTE — Telephone Encounter (Signed)
Labs from May 2018 reviewed; Rx approved

## 2017-02-15 ENCOUNTER — Other Ambulatory Visit: Payer: Self-pay | Admitting: Family Medicine

## 2017-02-16 ENCOUNTER — Other Ambulatory Visit: Payer: Self-pay | Admitting: Family Medicine

## 2017-03-08 ENCOUNTER — Ambulatory Visit: Payer: Medicare Other | Admitting: Family Medicine

## 2017-04-24 ENCOUNTER — Ambulatory Visit: Payer: Medicare Other | Admitting: Family Medicine

## 2017-04-30 ENCOUNTER — Telehealth: Payer: Self-pay

## 2017-04-30 ENCOUNTER — Encounter: Payer: Self-pay | Admitting: Family Medicine

## 2017-04-30 ENCOUNTER — Ambulatory Visit (INDEPENDENT_AMBULATORY_CARE_PROVIDER_SITE_OTHER): Payer: Medicare Other | Admitting: Family Medicine

## 2017-04-30 VITALS — BP 140/78 | HR 68 | Temp 97.8°F | Resp 16 | Wt 282.4 lb

## 2017-04-30 DIAGNOSIS — Z5181 Encounter for therapeutic drug level monitoring: Secondary | ICD-10-CM | POA: Diagnosis not present

## 2017-04-30 DIAGNOSIS — Z23 Encounter for immunization: Secondary | ICD-10-CM

## 2017-04-30 DIAGNOSIS — E1165 Type 2 diabetes mellitus with hyperglycemia: Secondary | ICD-10-CM

## 2017-04-30 DIAGNOSIS — I10 Essential (primary) hypertension: Secondary | ICD-10-CM | POA: Diagnosis not present

## 2017-04-30 DIAGNOSIS — E782 Mixed hyperlipidemia: Secondary | ICD-10-CM | POA: Diagnosis not present

## 2017-04-30 DIAGNOSIS — K760 Fatty (change of) liver, not elsewhere classified: Secondary | ICD-10-CM

## 2017-04-30 DIAGNOSIS — I712 Thoracic aortic aneurysm, without rupture, unspecified: Secondary | ICD-10-CM

## 2017-04-30 DIAGNOSIS — Z794 Long term (current) use of insulin: Secondary | ICD-10-CM

## 2017-04-30 MED ORDER — INSULIN LISPRO 100 UNIT/ML (KWIKPEN): 5.0000 [IU] | PEN_INJECTOR | Freq: Three times a day (TID) | SUBCUTANEOUS | 11 refills | Status: DC

## 2017-04-30 MED ORDER — INSULIN ASPART 100 UNIT/ML FLEXPEN: PEN_INJECTOR | SUBCUTANEOUS | 5 refills | Status: DC

## 2017-04-30 MED ORDER — INSULIN GLARGINE 100 UNIT/ML SOLOSTAR PEN: 60.0000 [IU] | PEN_INJECTOR | Freq: Every day | SUBCUTANEOUS | 5 refills | Status: DC

## 2017-04-30 MED ORDER — AMLODIPINE BESYLATE 10 MG PO TABS: 10.0000 mg | ORAL_TABLET | Freq: Every day | ORAL | 11 refills | Status: DC

## 2017-04-30 MED ORDER — FLUTICASONE PROPIONATE 50 MCG/ACT NA SUSP: 2.0000 | Freq: Every day | NASAL | 6 refills | Status: DC

## 2017-04-30 NOTE — Assessment & Plan Note (Addendum)
Need to control BP better; he never saw the specialist; urged importance of f/u with vascular

## 2017-04-30 NOTE — Assessment & Plan Note (Signed)
BMI > 35 with diabetes; urged weight loss

## 2017-04-30 NOTE — Patient Instructions (Addendum)
Continue sixty units of Lantus once a day at night STOP any of the vials of insulin Use Novolog for short-acting insulin coverage, five units with each meal or large snack three times a day max Check sugars 3 times a day and return in two weeks with your readings Use a cool pack over the right cheek in front of your ear 10 minutes at a time, 3 times a day until it feels better  Encourage your wife to NOT add salt when cooking Try to follow the DASH guidelines (DASH stands for Dietary Approaches to Stop Hypertension) Try to limit the sodium in your diet.  Ideally, consume less than 1.5 grams (less than 1,500mg ) per day. Do not add salt when cooking or at the table.  Check the sodium amount on labels when shopping, and choose items lower in sodium when given a choice. Avoid or limit foods that already contain a lot of sodium. Eat a diet rich in fruits and vegetables and whole grains.  Increase the amlodipine from 5 mg to 10 mg daily

## 2017-04-30 NOTE — Progress Notes (Signed)
BP 140/78 (BP Location: Left Arm, Patient Position: Sitting, Cuff Size: Normal)   Pulse 68   Temp 97.8 F (36.6 C) (Oral)   Resp 16   Wt 282 lb 6.4 oz (128.1 kg)   SpO2 94%   BMI 38.30 kg/m    Subjective:    Patient ID: Edgar [name], male    DOB: [birth year], 71 y.o.   MRN: [id number]  HPI: Edgar [name] is a 71 y.o. male  Chief Complaint  Patient presents with  . Follow-up    3 month; Question about his insulin   . Headache    Right side pain in his head. On/ off.  . Sinus Problem    Discuss meds     HPI Type 2 diabetes Using vial of insulin that he keeps in his pocket; warm; never refrigerates it Checking sugars in the morning and at night; 174 this morning No dry mouth Using the long-acting insulin sixty units every night  High blood pressure; has a cuff at home but not using it; not adding much salt  High cholesterol; on statin;  He has headache on the right side off an on; hurts on the right side; points over his right TMJ; bothering him for quite a wie He gets sinus issues every fall  Morbid obesity; he says Dr. Alveta Heimlich wanted him to lose down to 180 pounds; he said no way;   Aortic aneurysm; referred to vascular but he never went; he refused service per referral  Depression screen [facility] 2/9 04/30/2017 10/01/2016 09/11/2016 06/25/2016 05/18/2016  Decreased Interest 0 0 0 0 0  Down, Depressed, Hopeless 0 0 0 0 0  PHQ - 2 Score 0 0 0 0 0    Relevant past medical, surgical, family and social history reviewed Past Medical History:  Diagnosis Date  . Diabetes mellitus without complication ([place])   . Essential hypertension, benign 12/23/2015  . History of colonoscopy with polypectomy 12/07/2016   Polypectomy x 12 per patient  . Hx of tobacco use, presenting hazards to health 12/23/2015   Quit prior to 1997  . Hyperlipidemia 12/23/2015  . Hypertension   . Obesity 12/23/2015  . Pulmonary embolism ([place]) 05/18/2016   Hospitalization Sept 2017, following hip fracture    Past Surgical History:  Procedure Laterality Date  . COLONOSCOPY WITH PROPOFOL N/A 01/15/2017   Procedure: COLONOSCOPY WITH PROPOFOL;  Surgeon: Jonathon Bellows, MD;  Location: [facility] ENDOSCOPY;  Service: Endoscopy;  Laterality: N/A;  . EYE SURGERY    . INTRAMEDULLARY (IM) NAIL INTERTROCHANTERIC Left 04/14/2016   Procedure: INTRAMEDULLARY (IM) NAIL INTERTROCHANTRIC;  Surgeon: Corky Mull, MD;  Location: [facility] ORS;  Service: Orthopedics;  Laterality: Left;  . JOINT REPLACEMENT Left 04/2016  . TOTAL HIP ARTHROPLASTY     Family History  Problem Relation Age of Onset  . Cancer Mother   . Diabetes Mother   . Heart disease Father   . Alcohol abuse Brother    Social History   Social History  . Marital status: Married    Spouse name: N/A  . Number of children: N/A  . Years of education: N/A   Occupational History  . Not on file.   Social History Main Topics  . Smoking status: Former Research scientist (life sciences)  . Smokeless tobacco: Never Used  . Alcohol use No  . Drug use: No  . Sexual activity: Not Currently   Other Topics Concern  . Not on file   Social History Narrative  . No narrative on file  Interim medical history since last visit reviewed. Allergies and medications reviewed  Review of Systems Per HPI unless specifically indicated above     Objective:    BP 140/78 (BP Location: Left Arm, Patient Position: Sitting, Cuff Size: Normal)   Pulse 68   Temp 97.8 F (36.6 C) (Oral)   Resp 16   Wt 282 lb 6.4 oz (128.1 kg)   SpO2 94%   BMI 38.30 kg/m   Wt Readings from Last 3 Encounters:  04/30/17 282 lb 6.4 oz (128.1 kg)  01/15/17 285 lb (129.3 kg)  12/07/16 284 lb 14.4 oz (129.2 kg)    Physical Exam  Constitutional: He appears well-developed and well-nourished. No distress.  obese  HENT:  Head: Normocephalic and atraumatic.  Eyes: EOM are normal. No scleral icterus.  Neck: No thyromegaly present.  Cardiovascular: Normal rate and regular rhythm.   Pulmonary/Chest: Effort normal  and breath sounds normal.  Abdominal: Soft. Bowel sounds are normal. He exhibits no distension.  Musculoskeletal: He exhibits no edema.  Neurological: Coordination normal.  Skin: Skin is warm and dry. No pallor.  Psychiatric: He has a normal mood and affect. His behavior is normal. Judgment and thought content normal.   Diabetic Foot Form - Detailed   Diabetic Foot Exam - detailed Diabetic Foot exam was performed with the following findings:  Yes 04/30/2017 11:28 AM  Visual Foot Exam completed.:  Yes  Pulse Foot Exam completed.:  Yes  Right Dorsalis Pedis:  Present Left Dorsalis Pedis:  Present  Sensory Foot Exam Completed.:  Yes Semmes-Weinstein Monofilament Test R Site 1-Great Toe:  Pos L Site 1-Great Toe:  Pos        Results for orders placed or performed during the hospital encounter of 01/15/17  Glucose, capillary  Result Value Ref Range   Glucose-Capillary 149 (H) 65 - 99 mg/dL  Surgical pathology  Result Value Ref Range   SURGICAL PATHOLOGY      Surgical Pathology CASE: [id number] PATIENT: Tandy Puopolo Surgical Pathology Report     SPECIMEN SUBMITTED: A. Colon polyp x2, cecum; cbx B. Colon polyp, ascending; cbx C. Colon polyp, transverse, after methylene blue; hot snare D. Colon polyp x1, descending cold snare, sigmoid polyp; cold snare  CLINICAL HISTORY: None provided  PRE-OPERATIVE DIAGNOSIS: Z12.11 screening colonoscopy  POST-OPERATIVE DIAGNOSIS: Colon polyps, diverticulosis, hemorrhoids     DIAGNOSIS: A. COLON POLYP 2, CECUM; COLD BIOPSY: - TUBULAR ADENOMAS (2). - NEGATIVE FOR HIGH-GRADE DYSPLASIA AND MALIGNANCY.  B.  COLON POLYP, ASCENDING; COLD BIOPSY: - TUBULAR ADENOMA. - NEGATIVE FOR HIGH-GRADE DYSPLASIA AND MALIGNANCY.  C. COLON POLYP, TRANSVERSE, AFTER METHYLENE BLUE; HOT SNARE: - TUBULAR ADENOMA. - NEGATIVE FOR HIGH-GRADE DYSPLASIA AND MALIGNANCY.  D. COLON POLYP, DESCENDING AND SIGMOID; COLD SNARE: - TUBULAR ADENOMAS (2). -  NEGATIVE FOR HIGH-GRADE DYSPLASIA AND MALIGNANCY .   GROSS DESCRIPTION:  A. Labeled: C BX polyp cecum 2  Tissue fragment(s): multiple  Size: aggregate, 0.3 x 0.1 x 0.1 cm  Description: pink fragments and fecal material  Entirely submitted in one cassette(s).   B. Labeled: C BX polyp ascending  Tissue fragment(s): 1  Size: 0.4 cm  Description: Tan fragment  Entirely submitted in 1 cassette(s).   C. Labeled: hot snare polyp transverse after methylene blue  Tissue fragment(s): 1  Size: 0.6 x 0.5 x 0.2 cm  Description: blue tinged fragment of tissue, over inked blue and bisected  Entirely submitted in one cassette(s).   D. Labeled: cold snare polyp descending 1 cold snare polyp sigmoid  Tissue fragment(s): multiple  Size: aggregate, 1.5 x 0.3 x 0.1 cm  Description: pink to tan focally blue tinged fragments and fecal material  Entirely submitted in on cassette(s).    Final Diagnosis performed by Delorse Lek, MD.  Electronically signed 01/16/2017 2:00:32PM    The electronic signature  indicates that the named Attending Pathologist has evaluated the specimen  Technical component performed at [facility], [address] [place], [place] [address] Lab: [phone] Dir: Darrick Penna. Evette Doffing, MD  Professional component performed at [facility], [facility], [place], [place], [place] [address] Lab: [phone] Dir: Dellia Nims. Rubinas, MD        Assessment & Plan:   Problem List Items Addressed This Visit      Cardiovascular and Mediastinum   Essential hypertension, benign (Chronic)    DASH guidelines encouraged; weight loss encouraged      Relevant Medications   amLODipine (NORVASC) 10 MG tablet   Aneurysm of aorta (HCC)    Need to control BP better; he never saw the specialist; urged importance of f/u with vascular      Relevant Medications   amLODipine (NORVASC) 10 MG tablet   Other Relevant Orders   Ambulatory referral to  Vascular Surgery     Digestive   Fatty liver    Urged patient to lose weight      Relevant Orders   COMPLETE METABOLIC PANEL WITH GFR     Endocrine   Type II diabetes mellitus, uncontrolled ([place]) - Primary (Chronic)    Foot exam by MD; eye visit yearly; explained that keeping vial of insulin warm in his pocket is useless; stop the vials; just use long-acting and short-acting pens; check A1c and urine microalbumin      Relevant Medications   Insulin Glargine (LANTUS SOLOSTAR) 100 UNIT/ML Solostar Pen   insulin aspart (NOVOLOG FLEXPEN) 100 UNIT/ML FlexPen   Other Relevant Orders   Microalbumin / creatinine urine ratio   Lipid panel   Hemoglobin A1c     Other   Morbid obesity (HCC)    BMI > 35 with diabetes; urged weight loss      Relevant Medications   Insulin Glargine (LANTUS SOLOSTAR) 100 UNIT/ML Solostar Pen   insulin aspart (NOVOLOG FLEXPEN) 100 UNIT/ML FlexPen   Medication monitoring encounter    Check liver and kidneys      Relevant Orders   COMPLETE METABOLIC PANEL WITH GFR   Hyperlipidemia    Limit saturated fats; work on weight loss; check lipids; continue statin      Relevant Medications   amLODipine (NORVASC) 10 MG tablet   Other Relevant Orders   Lipid panel    Other Visit Diagnoses    Needs flu shot       Relevant Orders   Flu vaccine HIGH DOSE PF (Fluzone High dose) (Completed)       Follow up plan: Return in about 2 weeks (around 05/14/2017) for follow-up visit with Dr. Sanda Klein.  An after-visit summary was printed and given to the patient at [place].  Please see the patient instructions which may contain other information and recommendations beyond what is mentioned above in the assessment and plan.  Meds ordered this encounter  Medications  . Insulin Glargine (LANTUS SOLOSTAR) 100 UNIT/ML Solostar Pen    Sig: Inject 60 Units into the skin daily. Or as directed by your doctor    Dispense:  18 mL    Refill:  5  . insulin aspart (NOVOLOG  FLEXPEN) 100 UNIT/ML FlexPen  Sig: Inject five units before each meal up to three times a day    Dispense:  15 mL    Refill:  5  . amLODipine (NORVASC) 10 MG tablet    Sig: Take 1 tablet (10 mg total) by mouth daily.    Dispense:  30 tablet    Refill:  11    Higher dose; cancel the 5 mg refills  . fluticasone (FLONASE) 50 MCG/ACT nasal spray    Sig: Place 2 sprays into both nostrils daily.    Dispense:  16 g    Refill:  6    Orders Placed This Encounter  Procedures  . Flu vaccine HIGH DOSE PF (Fluzone High dose)  . Microalbumin / creatinine urine ratio  . Lipid panel  . Hemoglobin A1c  . COMPLETE METABOLIC PANEL WITH GFR  . Ambulatory referral to Vascular Surgery

## 2017-04-30 NOTE — Assessment & Plan Note (Signed)
Foot exam by MD; eye visit yearly; explained that keeping vial of insulin warm in his pocket is useless; stop the vials; just use long-acting and short-acting pens; check A1c and urine microalbumin

## 2017-04-30 NOTE — Assessment & Plan Note (Signed)
Urged patient to lose weight

## 2017-04-30 NOTE — Telephone Encounter (Signed)
I left detailed message for patient; encouraged him to keep his short and long acting insulins straight; okay to not use the short acting this evening if he does not have it for evening meal I'll send in the Humalog as requested instead of Novolog

## 2017-04-30 NOTE — Telephone Encounter (Signed)
Pt called and states Novolog flex pen is not covered with his insurance.  Pt inject this at 4:30 every day and he was concern that he wouldn't have it to inject. Pt states he has some humalog  left but you stated not to take this. Spoke with the pharmacist and he states humalog is similar to novolog that is covered but it will $80 copay for the patient. Pt is asking what would you like for him to do about his 4:30 injection.

## 2017-04-30 NOTE — Assessment & Plan Note (Signed)
Limit saturated fats; work on weight loss; check lipids; continue statin

## 2017-04-30 NOTE — Assessment & Plan Note (Signed)
Check liver and kidneys 

## 2017-04-30 NOTE — Assessment & Plan Note (Signed)
DASH guidelines encouraged; weight loss encouraged

## 2017-05-01 LAB — COMPLETE METABOLIC PANEL WITH GFR
AG Ratio: 1.1 (calc) (ref 1.0–2.5)
ALT: 11 U/L (ref 9–46)
AST: 14 U/L (ref 10–35)
Albumin: 3.9 g/dL (ref 3.6–5.1)
Alkaline phosphatase (APISO): 79 U/L (ref 40–115)
BUN: 14 mg/dL (ref 7–25)
CO2: 25 mmol/L (ref 20–32)
Calcium: 10.1 mg/dL (ref 8.6–10.3)
Chloride: 104 mmol/L (ref 98–110)
Creat: 0.8 mg/dL (ref 0.70–1.18)
GFR, Est African American: 105 mL/min/{1.73_m2} (ref >=60)
GFR, Est Non African American: 91 mL/min/{1.73_m2} (ref >=60)
Globulin: 3.4 g/dL (calc) (ref 1.9–3.7)
Glucose, Bld: 133 mg/dL — ABNORMAL HIGH (ref 65–99)
Potassium: 4.5 mmol/L (ref 3.5–5.3)
Sodium: 138 mmol/L (ref 135–146)
Total Bilirubin: 0.6 mg/dL (ref 0.2–1.2)
Total Protein: 7.3 g/dL (ref 6.1–8.1)

## 2017-05-01 LAB — HEMOGLOBIN A1C
Hgb A1c MFr Bld: 8.2 % of total Hgb — ABNORMAL HIGH (ref <5.7)
Mean Plasma Glucose: 189 (calc)
eAG (mmol/L): 10.4 (calc)

## 2017-05-01 LAB — LIPID PANEL
Cholesterol: 122 mg/dL (ref <200)
HDL: 36 mg/dL — ABNORMAL LOW (ref >40)
LDL Cholesterol (Calc): 71 mg/dL (calc)
Non-HDL Cholesterol (Calc): 86 mg/dL (calc) (ref <130)
Total CHOL/HDL Ratio: 3.4 (calc) (ref <5.0)
Triglycerides: 66 mg/dL (ref <150)

## 2017-05-01 LAB — MICROALBUMIN / CREATININE URINE RATIO
Creatinine, Urine: 121 mg/dL (ref 20–320)
Microalb Creat Ratio: 145 mcg/mg creat — ABNORMAL HIGH (ref <30)
Microalb, Ur: 17.5 mg/dL

## 2017-05-14 ENCOUNTER — Ambulatory Visit (INDEPENDENT_AMBULATORY_CARE_PROVIDER_SITE_OTHER): Payer: Medicare Other | Admitting: Family Medicine

## 2017-05-14 ENCOUNTER — Encounter: Payer: Self-pay | Admitting: Family Medicine

## 2017-05-14 DIAGNOSIS — Z5181 Encounter for therapeutic drug level monitoring: Secondary | ICD-10-CM | POA: Diagnosis not present

## 2017-05-14 DIAGNOSIS — R809 Proteinuria, unspecified: Secondary | ICD-10-CM | POA: Diagnosis not present

## 2017-05-14 DIAGNOSIS — I10 Essential (primary) hypertension: Secondary | ICD-10-CM

## 2017-05-14 DIAGNOSIS — K760 Fatty (change of) liver, not elsewhere classified: Secondary | ICD-10-CM

## 2017-05-14 DIAGNOSIS — E1129 Type 2 diabetes mellitus with other diabetic kidney complication: Secondary | ICD-10-CM

## 2017-05-14 DIAGNOSIS — E1165 Type 2 diabetes mellitus with hyperglycemia: Secondary | ICD-10-CM

## 2017-05-14 DIAGNOSIS — I712 Thoracic aortic aneurysm, without rupture, unspecified: Secondary | ICD-10-CM

## 2017-05-14 DIAGNOSIS — E782 Mixed hyperlipidemia: Secondary | ICD-10-CM

## 2017-05-14 HISTORY — DX: Type 2 diabetes mellitus with other diabetic kidney complication: R80.9

## 2017-05-14 HISTORY — DX: Type 2 diabetes mellitus with other diabetic kidney complication: E11.29

## 2017-05-14 MED ORDER — EMPAGLIFLOZIN 10 MG PO TABS
10.0000 mg | ORAL_TABLET | Freq: Every day | ORAL | 5 refills | Status: DC
Start: 2017-05-14 — End: 2017-08-26

## 2017-05-14 NOTE — Assessment & Plan Note (Addendum)
Urged patient to see the vascular specialist; staff to help him get that information; weight loss, blood pressure, diabetes, cholesterol all involved

## 2017-05-14 NOTE — Assessment & Plan Note (Signed)
Work on [employer] loss, healthier low-fat diet

## 2017-05-14 NOTE — Assessment & Plan Note (Signed)
Continue max dose of ARB; work on weight loss, getting DM under better control

## 2017-05-14 NOTE — Assessment & Plan Note (Signed)
Stressed the importance of weight loss, 50 pounds over the next year

## 2017-05-14 NOTE — Assessment & Plan Note (Addendum)
Reviewed recent lipid panel; so close to goal (LDL less than 70), avoid fatty meats and cheese and eggs; stressed weight loss

## 2017-05-14 NOTE — Progress Notes (Signed)
BP 120/62   Pulse 71   Temp (!) 97.4 F (36.3 C) (Oral)   Resp 16   Wt 283 lb 6.4 oz (128.5 kg)   SpO2 95%   BMI 38.44 kg/m    Subjective:    Patient ID: Edgar [name], male    DOB: 20-Jun-1946, 71 y.o.   MRN: [id number]  HPI: Edgar [name] is a 71 y.o. male  Chief Complaint  Patient presents with  . Follow-up  . Medication Refill    needs new meter, strips, lancets   HPI Patient is here for f/u He has not complaints about his diabetes; has the new pen needles He checks his FSBS; running lower than before; checking TID most of the time, at least BID Working three jobs, working hard, staying really busy He gave up sodas  Patient says he needs to have an operation; he has a thoracic aortic aneurysm I explained that he has been referred to a vascular specialist  Obesity; he is not really losing weight; stuck at same range  High cholesterol; given up bologna for the most part  HTN; he gave up salt  Depression screen [facility] 2/9 05/14/2017 04/30/2017 10/01/2016 09/11/2016 06/25/2016  Decreased Interest 0 0 0 0 0  Down, Depressed, Hopeless 0 0 0 0 0  PHQ - 2 Score 0 0 0 0 0    Relevant past medical, surgical, family and social history reviewed Past Medical History:  Diagnosis Date  . Diabetes mellitus without complication ([place])   . Essential hypertension, benign 12/23/2015  . History of colonoscopy with polypectomy 12/07/2016   Polypectomy x 12 per patient  . Hx of tobacco use, presenting hazards to health 12/23/2015   Quit prior to 1997  . Hyperlipidemia 12/23/2015  . Hypertension   . 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 Obesity 12/23/2015  . Pulmonary embolism ([place]) 05/18/2016   Hospitalization Sept 2017, following hip fracture   Past Surgical History:  Procedure Laterality Date  . COLONOSCOPY WITH PROPOFOL N/A 01/15/2017   Procedure: COLONOSCOPY WITH PROPOFOL;  Surgeon: Jonathon Bellows, MD;  Location: [facility] ENDOSCOPY;  Service: Endoscopy;   Laterality: N/A;  . EYE SURGERY    . INTRAMEDULLARY (IM) NAIL INTERTROCHANTERIC Left 04/14/2016   Procedure: INTRAMEDULLARY (IM) NAIL INTERTROCHANTRIC;  Surgeon: Corky Mull, MD;  Location: [facility] ORS;  Service: Orthopedics;  Laterality: Left;  . JOINT REPLACEMENT Left 04/2016  . TOTAL HIP ARTHROPLASTY     Family History  Problem Relation Age of Onset  . Cancer Mother   . Diabetes Mother   . Heart disease Father   . Alcohol abuse Brother    Social History   Social History  . Marital status: Married    Spouse name: N/A  . Number of children: N/A  . Years of education: N/A   Occupational History  . Not on file.   Social History Main Topics  . Smoking status: Former Research scientist (life sciences)  . Smokeless tobacco: Never Used  . Alcohol use No  . Drug use: No  . Sexual activity: Not Currently   Other Topics Concern  . Not on file   Social History Narrative  . No narrative on file    Interim medical history since last visit reviewed. Allergies and medications reviewed  Review of Systems Per HPI unless specifically indicated above     Objective:    BP 120/62   Pulse 71   Temp (!) 97.4 F (36.3 C) (Oral)   Resp  16   Wt 283 lb 6.4 oz (128.5 kg)   SpO2 95%   BMI 38.44 kg/m   Wt Readings from Last 3 Encounters:  05/14/17 283 lb 6.4 oz (128.5 kg)  04/30/17 282 lb 6.4 oz (128.1 kg)  01/15/17 285 lb (129.3 kg)    Physical Exam  Constitutional: He appears well-developed and well-nourished. No distress.  obese  HENT:  Head: Normocephalic and atraumatic.  Eyes: EOM are normal. No scleral icterus.  Neck: No thyromegaly present.  Cardiovascular: Normal rate and regular rhythm.   Pulmonary/Chest: Effort normal and breath sounds normal.  Abdominal: Soft. Bowel sounds are normal. He exhibits no distension.  Musculoskeletal: He exhibits no edema.  Neurological: He is alert.  Skin: Skin is warm and dry. No pallor.  Psychiatric: He has a normal mood and affect. His behavior is normal.  Judgment and thought content normal.   Diabetic Foot Form - Detailed   Diabetic Foot Exam - detailed Diabetic Foot exam was performed with the following findings:  Yes 05/14/2017  9:57 AM  Visual Foot Exam completed.:  Yes  Pulse Foot Exam completed.:  Yes  Right Dorsalis Pedis:  Present Left Dorsalis Pedis:  Present  Sensory Foot Exam Completed.:  Yes Semmes-Weinstein Monofilament Test R Site 1-Great Toe:  Pos L Site 1-Great Toe:  Pos        Results for orders placed or performed in visit on 04/30/17  Microalbumin / creatinine urine ratio  Result Value Ref Range   Creatinine, Urine 121 20 - 320 mg/dL   Microalb, Ur 17.5 mg/dL   Microalb Creat Ratio 145 (H) <30 mcg/mg creat  Lipid panel  Result Value Ref Range   Cholesterol 122 <200 mg/dL   HDL 36 (L) >40 mg/dL   Triglycerides 66 <150 mg/dL   LDL Cholesterol (Calc) 71 mg/dL (calc)   Total CHOL/HDL Ratio 3.4 <5.0 (calc)   Non-HDL Cholesterol (Calc) 86 <130 mg/dL (calc)  Hemoglobin A1c  Result Value Ref Range   Hgb A1c MFr Bld 8.2 (H) <5.7 % of total Hgb   Mean Plasma Glucose 189 (calc)   eAG (mmol/L) 10.4 (calc)  COMPLETE METABOLIC PANEL WITH GFR  Result Value Ref Range   Glucose, Bld 133 (H) 65 - 99 mg/dL   BUN 14 7 - 25 mg/dL   Creat 0.80 0.70 - 1.18 mg/dL   GFR, Est Non African American 91 > OR = 60 mL/min/1.30m   GFR, Est African American 105 > OR = 60 mL/min/1.751m  BUN/Creatinine Ratio NOT APPLICABLE 6 - 22 (calc)   Sodium 138 135 - 146 mmol/L   Potassium 4.5 3.5 - 5.3 mmol/L   Chloride 104 98 - 110 mmol/L   CO2 25 20 - 32 mmol/L   Calcium 10.1 8.6 - 10.3 mg/dL   Total Protein 7.3 6.1 - 8.1 g/dL   Albumin 3.9 3.6 - 5.1 g/dL   Globulin 3.4 1.9 - 3.7 g/dL (calc)   AG Ratio 1.1 1.0 - 2.5 (calc)   Total Bilirubin 0.6 0.2 - 1.2 mg/dL   Alkaline phosphatase (APISO) 79 40 - 115 U/L   AST 14 10 - 35 U/L   ALT 11 9 - 46 U/L      Assessment & Plan:   Problem List Items Addressed This Visit      Cardiovascular  and Mediastinum   Essential hypertension, benign (Chronic)    Excellent control      Aneurysm of aorta ([facility]   Urged patient to see  the vascular specialist; staff to help him get that information; weight loss, blood pressure, diabetes, cholesterol all involved        Digestive   Fatty liver    Work on weight loss, healthier low-fat diet        Endocrine   Type II diabetes mellitus, uncontrolled (HCC) (Chronic)    Foot exam by MD; weight loss key; add Jardiance      Relevant Medications   empagliflozin (JARDIANCE) 10 MG TABS tablet   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[place])    Continue max dose of ARB; work on weight loss, getting DM under better control      Relevant Medications   empagliflozin (JARDIANCE) 10 MG TABS tablet     Other   Morbid obesity ([place])    Stressed the importance of weight loss, 50 pounds over the next year      Relevant Medications   empagliflozin (JARDIANCE) 10 MG TABS tablet   Medication monitoring encounter    recenty checked liver and kidneys      Hyperlipidemia    Reviewed recent lipid panel; so close to goal (LDL less than 70), avoid fatty meats and cheese and eggs; stressed weight loss          Follow up plan: Return in about 3 months (around 08/14/2017) for twenty minute follow-up with fasting labs.  An after-visit summary was printed and given to the patient at [place].  Please see the patient instructions which may contain other information and recommendations beyond what is mentioned above in the assessment and plan.  Meds ordered this encounter  Medications  . glucose blood test strip    Sig: 320 each by Other route 3 (three) times daily. Use as instructed  . PRODIGY LANCETS 28G MISC    Sig: by Does not apply route.  . Blood Glucose Monitoring Suppl (PRODIGY AUTOCODE BLOOD GLUCOSE) w/Device KIT    Sig: by Does not apply route.  . empagliflozin (JARDIANCE) 10 MG TABS tablet    Sig: Take 10 mg by mouth daily.      Dispense:  30 tablet    Refill:  5    No orders of the defined types were placed in this encounter.

## 2017-05-14 NOTE — Assessment & Plan Note (Addendum)
recenty checked liver and kidneys

## 2017-05-14 NOTE — Assessment & Plan Note (Addendum)
Foot exam by MD; weight loss key; add Jardiance

## 2017-05-14 NOTE — Assessment & Plan Note (Signed)
Excellent control.   

## 2017-05-14 NOTE — Patient Instructions (Addendum)
Please do get in to see the vascular specialist -- the staff here today can give you that contact information This is very important to follow-up on your aortic aneurysm  Really work on weight loss -- this is so important I would like for you to try to lose 50 pounds over the next year Check out the information at familydoctor.org entitled "Nutrition for Weight Loss: What You Need to Know about Fad Diets" Try to lose between 1-2 pounds per week by taking in fewer calories and burning off more calories You can succeed by limiting portions, limiting foods dense in calories and fat, becoming more active, and drinking 8 glasses of water a day (64 ounces) Don't skip meals, especially breakfast, as skipping meals may alter your metabolism Do not use over-the-counter weight loss pills or gimmicks that claim rapid weight loss A healthy BMI (or body mass index) is between 18.5 and 24.9 You can calculate your ideal BMI at the [place] website ClubMonet[employer].fr  Start the new medicine to help your diabetes and this will also help you with weight loss  Thoracic Aortic Aneurysm An aneurysm is a bulge in an artery. It happens when blood pushes up against a weakened or damaged artery wall. A thoracic aortic aneurysm is an aneurysm that occurs in the first part of the aorta, between the heart and the diaphragm. The aorta is the main artery of the body. It supplies blood from the heart to the rest of the body. Some aneurysms may not cause symptoms or problems. However, the major concern with a thoracic aortic aneurysm is that it can enlarge and burst (rupture), or blood can flow between the layers of the wall of the aorta through a tear (aorticdissection). Both of these conditions can cause bleeding inside the body and can be life-threatening if they are not diagnosed and treated right away. What are the causes? The exact cause of this condition is not known. What  increases the risk? The following factors may make you more likely to develop this condition:  Being age 102 or older.  Having a hardening of the arteries caused by the buildup of fat and other substances in the lining of a blood vessel (arteriosclerosis).  Having inflammation of the walls of an artery (arteritis).  Having a genetic disease that weakens the body's connective tissue, such as Marfan syndrome.  Having an injury or trauma to the aorta.  Having an infection that is caused by bacteria, such as syphilis or staphylococcus, in the wall of the aorta (infectious aortitis).  Having high blood pressure (hypertension).  Being male.  Being white (Caucasian).  Having high cholesterol.  Having a family history of aneurysms.  Using tobacco.  Having chronic obstructive pulmonary disease (COPD).  What are the signs or symptoms? Symptoms of this condition vary depending on the size and rate of growth of the aneurysm. Most grow slowly and do not cause any symptoms. When symptoms do occur, they may include:  Pain in the chest, back, sides, or abdomen. The pain may vary in intensity. A sudden onset of severe pain may indicate that the aneurysm has ruptured.  Hoarseness.  Cough.  Shortness of breath.  Swallowing problems.  Swelling in the face, arms, or legs.  Fever.  Unexplained weight loss.  How is this diagnosed? This condition may be diagnosed with:  An ultrasound.  X-rays.  A CT scan.  An MRI.  Tests to check the arteries for damage or blockages (angiogram).  Most unruptured thoracic aortic  aneurysms cause no symptoms, so they are often found during exams for other conditions. How is this treated? Treatment for this condition depends on:  The size of the aneurysm.  How fast the aneurysm is growing.  Your age.  Risk factors for rupture.  Aneurysms that are smaller than 2.2 inches (5.5 cm) may be managed by using medicines to control blood pressure,  manage pain, or fight infection. You may need regular monitoring to see if the aneurysm is getting bigger. Your health care provider may recommend that you have an ultrasound every year or every 6 months. How often you need to have an ultrasound depends on the size of the aneurysm, how fast it is growing, and whether you have a family history of aneurysms. Surgical repair may be needed if your aneurysm is larger than 2.2 inches or if it is growing quickly. Follow these instructions at home: Eating and drinking  Eat a healthy diet. Your health care provider may recommend that you: ? Lower your salt (sodium) intake. In some people, too much salt can raise blood pressure and increase the risk of thoracic aortic aneurysm. ? Avoid foods that are high in saturated fat and cholesterol, such as red meat and dairy. ? Eat a diet that is low in sugar. ? Increase your fiber intake by including whole grains, vegetables, and fruits in your diet. Eating these foods may help to lower blood pressure.  Limit or avoid alcohol as recommended by your health care provider. Lifestyle  Follow instructions from your health care provider about healthy lifestyle habits. Your health care provider may recommend that you: ? Do not use any products that contain nicotine or tobacco, such as cigarettes and e-cigarettes. If you need help quitting, ask your health care provider. ? Keep your blood pressure within normal limits. The target limit for most people is below 120/80. Check your blood pressure regularly. If it is high, ask your health care provider about ways that you can control it. ? Keep your blood sugar (glucose) level and cholesterol levels within normal limits. Target limits for most people are:  Blood glucose level: Less than 100 mg/dL.  Total cholesterol level: Less than 200 mg/dL. ? Maintain a healthy weight. Activity  Stay physically active and exercise regularly. Talk with your health care provider about  how often you should exercise and ask which types of exercise are safe for you.  Avoid heavy lifting and activities that take a lot of effort (are strenuous). Ask your health care provider what activities are safe for you. General instructions  Keep all follow-up visits as told by your health care provider. This is important. ? Talk with your health care provider about regular screenings to see if the aneurysm is getting bigger.  Take over-the-counter and prescription medicines only as told by your health care provider. Contact a health care provider if:  You have discomfort in your upper back, neck, or abdomen.  You have trouble swallowing.  You have a cough or hoarseness.  You have a family history of aneurysms.  You have unexplained weight loss. Get help right away if:  You have sudden, severe pain in your upper back and abdomen. This pain may move into your chest and arms.  You have shortness of breath.  You have a fever. This information is not intended to replace advice given to you by your health care provider. Make sure you discuss any questions you have with your health care provider. Document Released: 07/23/2005 Document Revised:  05/04/2016 Document Reviewed: 05/04/2016 Elsevier Interactive Patient Education  [employer].

## 2017-05-24 ENCOUNTER — Telehealth: Payer: Self-pay

## 2017-05-24 NOTE — Telephone Encounter (Signed)
I also tried to contact this patient to inform him of the referral and that they are trying to get in contact with him to schedule an appt, but there was no answer. A message was left for him with their number asking him to give their office a call when he got the chance.  Can you please try to reach him or send letter? This is so important. Thank you thank you!      ----- Message -----  From: Danford Bad  Sent: 05/23/2017  2:27 PM  To: Arnetha Courser, MD  Subject: Referral                       Thank you for this referral. We have made several attempts to contact this patient. If this appointment is still needed please have patient contact our office and we will be happy to schedule the appointment. We will keep this referral open for 30 more days.

## 2017-05-30 ENCOUNTER — Ambulatory Visit (INDEPENDENT_AMBULATORY_CARE_PROVIDER_SITE_OTHER): Payer: Medicare Other | Admitting: Vascular Surgery

## 2017-05-30 ENCOUNTER — Encounter (INDEPENDENT_AMBULATORY_CARE_PROVIDER_SITE_OTHER): Payer: Self-pay | Admitting: Vascular Surgery

## 2017-05-30 DIAGNOSIS — I712 Thoracic aortic aneurysm, without rupture, unspecified: Secondary | ICD-10-CM

## 2017-05-30 DIAGNOSIS — E1165 Type 2 diabetes mellitus with hyperglycemia: Secondary | ICD-10-CM | POA: Diagnosis not present

## 2017-05-30 DIAGNOSIS — I10 Essential (primary) hypertension: Secondary | ICD-10-CM | POA: Diagnosis not present

## 2017-05-30 DIAGNOSIS — E782 Mixed hyperlipidemia: Secondary | ICD-10-CM

## 2017-05-30 NOTE — Progress Notes (Signed)
Subjective:    Patient ID: Edgar [name], male    DOB: June 15, 1946, 71 y.o.   MRN: [id number] Chief Complaint  Patient presents with  . 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 Sanda Klein for a thoracic aneurysm. Patient with a thoracic aneurysm on September 11, 2016 CTA of the chest. Patient presents today without complaint. Patient denies any shortness of breath or chest pain. "Aneurysmal dilatation ascending thoracic aorta measured at 4.4cm transverse diameter at the same places measure on the previous exam, unchanged. At aortic root the aorta measures 4.6 cm transverse in previously 4.5 cm. On sagittal images, mild aneurysmal dilatation of the proximal descending thoracic aorta is identified 4.2 cm diameter image 86, previously 4.1 cm by my measurement at the same position." Patient denies any fever, nausea or vomiting.   Review of Systems  Constitutional: Negative.   HENT: Negative.   Eyes: Negative.   Respiratory: Negative.   Cardiovascular:       Thoracic aneurysm  Gastrointestinal: Negative.   Endocrine: Negative.   Genitourinary: Negative.   Musculoskeletal: Negative.   Skin: Negative.   Allergic/Immunologic: Negative.   Neurological: Negative.   Hematological: Negative.   Psychiatric/Behavioral: Negative.       Objective:   Physical Exam  Constitutional: He is oriented to person, place, and time. He appears well-developed and well-nourished. No distress.  HENT:  Head: Normocephalic and atraumatic.  Eyes: Pupils are equal, round, and reactive to light. Conjunctivae are normal.  Neck: Normal range of motion.  Cardiovascular: Normal rate, regular rhythm, normal heart sounds and intact distal pulses.   Pulses:      Radial pulses are 2+ on the right side, and 2+ on the left side.  Pulmonary/Chest: Effort normal.  Musculoskeletal: Normal range of motion. He exhibits no edema.  Neurological: He is alert and  oriented to person, place, and time.  Skin: Skin is warm and dry. He is not diaphoretic.  Psychiatric: He has a normal mood and affect. His behavior is normal. Judgment and thought content normal.  Vitals reviewed.  BP (!) 144/84 (BP Location: Right Arm)   Pulse 66   Resp 17   Ht 6' (1.829 m)   Wt 279 lb (126.6 kg)   BMI 37.84 kg/m   Past Medical History:  Diagnosis Date  . Diabetes mellitus without complication ([place])   . Essential hypertension, benign 12/23/2015  . History of colonoscopy with polypectomy 12/07/2016   Polypectomy x 12 per patient  . Hx of tobacco use, presenting hazards to health 12/23/2015   Quit prior to 1997  . Hyperlipidemia 12/23/2015  . Hypertension   . 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[place]) 05/14/2017  . Obesity 12/23/2015  . Pulmonary embolism ([place]) 05/18/2016   Hospitalization Sept 2017, following hip fracture   Social History   Social History  . Marital status: Married    Spouse name: N/A  . Number of children: N/A  . Years of education: N/A   Occupational History  . Not on file.   Social History Main Topics  . Smoking status: Former Research scientist (life sciences)  . Smokeless tobacco: Never Used  . Alcohol use No  . Drug use: No  . Sexual activity: Not Currently   Other Topics Concern  . Not on file   Social History Narrative  . No narrative on file   Past Surgical History:  Procedure Laterality Date  . COLONOSCOPY WITH  PROPOFOL N/A 01/15/2017   Procedure: COLONOSCOPY WITH PROPOFOL;  Surgeon: Jonathon Bellows, MD;  Location: [facility] ENDOSCOPY;  Service: Endoscopy;  Laterality: N/A;  . EYE SURGERY    . INTRAMEDULLARY (IM) NAIL INTERTROCHANTERIC Left 04/14/2016   Procedure: INTRAMEDULLARY (IM) NAIL INTERTROCHANTRIC;  Surgeon: Corky Mull, MD;  Location: [facility] ORS;  Service: Orthopedics;  Laterality: Left;  . JOINT REPLACEMENT Left 04/2016  . TOTAL HIP ARTHROPLASTY     Family History  Problem Relation Age of Onset  . Cancer Mother   . Diabetes Mother     . Heart disease Father   . Alcohol abuse Brother    No Known Allergies     Assessment & Plan:  Presents as a new patient referred by Dr. Sanda Klein for a thoracic aneurysm. Patient with a thoracic aneurysm on September 11, 2016 CTA of the chest. Patient presents today without complaint. Patient denies any shortness of breath or chest pain. "Aneurysmal dilatation ascending thoracic aorta measured at 4.4cm transverse diameter at the same places measure on the previous exam, unchanged. At aortic root the aorta measures 4.6 cm transverse in previously 4.5 cm. On sagittal images, mild aneurysmal dilatation of the proximal descending thoracic aorta is identified 4.2 cm diameter image 86, previously 4.1 cm by my measurement at the same position." Patient denies any fever, nausea or vomiting.  1. Thoracic aortic aneurysm without rupture ([place]) - Stable Patient with stable thoracic aneurysm. Most recent imaging from February 2018. I had a long discussion in regard to anatomy and need for yearly imaging. Our office will be happy to follow the growth if any of his aneurysm however he does understand if he would need surgical intervention would have to refer him to a cardiac/thoracic surgeon. He expresses his understanding. I have reviewed the importance of hypertension, diabetic and lipid control and the importance of continuing his abstinence from tobacco.  The patient is also encouraged to exercise a minimum of 30 minutes 4 times a week.  Should the patient develop new onset chest pain, back pain or shortness of breath he was instructed to seek medical attention immediately and to alert the physician providing care that they have an aneurysm.  The patient voices their understanding.  - CT Angio Chest W/Cm &/Or Wo Cm; Future - BUN+Creat  2. Essential hypertension, benign - Stable Encouraged good control as its slows the progression of atherosclerotic and aneurysmal disease.  3. Uncontrolled type 2  diabetes mellitus with hyperglycemia (HCC) - Stable Encouraged good control as its slows the progression of atherosclerotic and aneurysmal disease.  4. Mixed hyperlipidemia - Stable Encouraged good control as its slows the progression of atherosclerotic and aneurysmal disease.  Current Outpatient Prescriptions on File Prior to Visit  Medication Sig Dispense Refill  . acetaminophen (TYLENOL) 500 MG tablet Take 500 mg by mouth every 6 (six) hours as needed.    Marland Kitchen amLODipine (NORVASC) 10 MG tablet Take 1 tablet (10 mg total) by mouth daily. 30 tablet 11  . aspirin EC 81 MG tablet Take 81 mg by mouth daily.    Marland Kitchen atorvastatin (LIPITOR) 40 MG tablet TAKE 1 TABLET BY MOUTH AT BEDTIME 30 tablet 4  . BD INSULIN SYRINGE ULTRAFINE 31G X 5/16" 0.3 ML MISC USE AS DIRECTED 4 TIMES DAILY 200 each 5  . Blood Glucose Monitoring Suppl (PRODIGY AUTOCODE BLOOD GLUCOSE) w/Device KIT by Does not apply route.    . cholecalciferol (VITAMIN D) 1000 units tablet Take 1,000 Units by mouth 2 (two) times daily.     Marland Kitchen  empagliflozin (JARDIANCE) 10 MG TABS tablet Take 10 mg by mouth daily. 30 tablet 5  . Fish Oil-Cholecalciferol (FISH OIL + D3) 1200-1000 MG-UNIT CAPS Take 1,200 mg by mouth daily.    . fluticasone (FLONASE) 50 MCG/ACT nasal spray Place 2 sprays into both nostrils daily. (Patient taking differently: Place 2 sprays into both nostrils daily as needed. ) 16 g 6  . glucose blood test strip 320 each by Other route 3 (three) times daily. Use as instructed    . Insulin Glargine (LANTUS SOLOSTAR) 100 UNIT/ML Solostar Pen Inject 60 Units into the skin daily. Or as directed by your doctor 18 mL 5  . insulin lispro (HUMALOG KWIKPEN) 100 UNIT/ML KiwkPen Inject 0.05 mLs (5 Units total) into the skin 3 (three) times daily with meals. SHORT-acting insulin 15 mL 11  . Insulin Pen Needle (B-D UF III MINI PEN NEEDLES) 31G X 5 MM MISC use as directed once a day with Lantus 100 each 1  . losartan (COZAAR) 100 MG tablet TAKE 1  TABLET (100 MG TOTAL) BY MOUTH DAILY. 90 tablet 1  . PRODIGY LANCETS 28G MISC by Does not apply route.     No current facility-administered medications on file prior to visit.    There are no Patient Instructions on file for this visit. No Follow-up on file.  Yanet Balliet A Jeri Jeanbaptiste, PA-C

## 2017-05-31 ENCOUNTER — Other Ambulatory Visit: Payer: Self-pay | Admitting: Family Medicine

## 2017-05-31 MED ORDER — GLUCOSE BLOOD VI STRP: ORAL_STRIP | 3 refills | Status: DC

## 2017-05-31 NOTE — Telephone Encounter (Signed)
Copied from [place] [id number]. Topic: Quick Communication - See Telephone Encounter >> May 31, 2017  2:14 PM Robina Ade, Helene Kelp D wrote: CRM for notification. See Telephone encounter for: 05/31/17. Patient needs refill on his test strips and needs them sent to [facility] on [address].

## 2017-06-19 ENCOUNTER — Telehealth: Payer: Self-pay | Admitting: Family Medicine

## 2017-06-19 NOTE — Telephone Encounter (Signed)
Patient is requesting a new prescription for a new diabetic meter with strips that are not expensive. Preferred pharmacy [facility] [address] in [place]. Pt states he has not checked his blood sugar in 2 or 3 weeks.

## 2017-06-26 ENCOUNTER — Other Ambulatory Visit: Payer: Self-pay | Admitting: Family Medicine

## 2017-06-26 NOTE — Telephone Encounter (Signed)
Copied from [place] [id number]. Topic: Quick Communication - See Telephone Encounter >> Jun 26, 2017  3:59 PM Cleaster Corin, [place] wrote: CRM for notification. See Telephone encounter for:   06/26/17. Pt. Daughter stephanie called to see about getting pt. Edgar [name] refill on his test strips and meter please call daughter with any questions ([facility] [address] [place] ([place]) [place] [address] Phone: [phone] Fax: [phone] Not a 24 hour pharmacy; exact hours not known

## 2017-07-01 ENCOUNTER — Telehealth: Payer: Self-pay

## 2017-07-01 MED ORDER — PRODIGY LANCETS 28G MISC: 12 refills | Status: DC

## 2017-07-01 MED ORDER — PRODIGY AUTOCODE BLOOD GLUCOSE W/DEVICE KIT: PACK | 0 refills | Status: DC

## 2017-07-01 MED ORDER — PRODIGY LANCETS 28G MISC: 5 refills | Status: DC

## 2017-07-01 MED ORDER — GLUCOSE BLOOD VI STRP: ORAL_STRIP | 12 refills | Status: DC

## 2017-07-01 MED ORDER — GLUCOSE BLOOD VI STRP: ORAL_STRIP | 5 refills | Status: DC

## 2017-07-01 NOTE — Telephone Encounter (Signed)
Received clarification request from [place] stating that the need a new rx with no more than 5 refills. Rx that was originally sent had 12 refills.

## 2017-07-01 NOTE — Telephone Encounter (Signed)
Pt ins is requiring new rx for meter and test strips

## 2017-07-03 ENCOUNTER — Telehealth: Payer: Self-pay | Admitting: Family Medicine

## 2017-07-03 NOTE — Telephone Encounter (Signed)
The Prodigy product is not covered by his insurance See form sent by [facility] If he wants to buy these out of pocket, that is up to him If he wants what his insurance company will pay for, ask him to find out what they will cover and we'll prescribe that instead Thank you

## 2017-07-03 NOTE — Telephone Encounter (Signed)
See earlier note; the pharmacy sent a separate fax with what insurance will cover; he can buy the Prodigy out of pocket or switch to another; if he wants the other, please add those to med list and I'll approve; thanks

## 2017-07-04 ENCOUNTER — Other Ambulatory Visit: Payer: Self-pay

## 2017-07-04 ENCOUNTER — Other Ambulatory Visit: Payer: Self-pay | Admitting: Family Medicine

## 2017-07-04 MED ORDER — GLUCOSE BLOOD VI STRP: ORAL_STRIP | 12 refills | Status: DC

## 2017-07-04 MED ORDER — ONETOUCH ULTRA 2 W/DEVICE KIT: PACK | 0 refills | Status: DC

## 2017-07-04 MED ORDER — ONETOUCH ULTRASOFT LANCETS MISC: 12 refills | Status: DC

## 2017-07-04 NOTE — Progress Notes (Signed)
Rx's signed.

## 2017-07-04 NOTE — Telephone Encounter (Signed)
Reviewed Cr and K+ Rx approved 

## 2017-07-08 NOTE — Telephone Encounter (Signed)
Roselyn Reef spoke to him last week

## 2017-07-22 ENCOUNTER — Other Ambulatory Visit: Payer: Self-pay | Admitting: Family Medicine

## 2017-07-22 NOTE — Telephone Encounter (Signed)
Copied from [place] [phone]. Topic: Quick Communication - Rx Refill/Question >> Jul 22, 2017  1:31 PM Marin Olp L wrote: Has the patient contacted their pharmacy? Yes.   (Agent: If no, request that the patient contact the pharmacy for the refill.) Preferred Pharmacy (with phone number or street name): [id number] [place] [place]: Please be advised that RX refills may take up to 3 business days. We ask that you follow-up with your pharmacy. Refill Insulin Pen Needle (B-D UF III MINI PEN NEEDLES) 31G X 5 MM MISC (patient won't have any needles after today)

## 2017-07-22 NOTE — Telephone Encounter (Signed)
Request for insulin pen refill.

## 2017-08-14 ENCOUNTER — Ambulatory Visit: Payer: Medicare Other | Admitting: Family Medicine

## 2017-08-19 ENCOUNTER — Ambulatory Visit: Payer: Medicare Other | Admitting: Family Medicine

## 2017-08-20 IMAGING — CT CT ABD-PELV W/ CM
2 of 5 series · 16 of 46 positions shown, 18 images · IV contrast (APPLIED)
Comparison: October 21, 2014

CLINICAL DATA: Generalized abdomen pain with nausea vomiting
starting today.

EXAM:
CT ABDOMEN AND PELVIS WITH CONTRAST
TECHNIQUE: Multidetector CT imaging of the abdomen and pelvis was performed
using the standard protocol following bolus administration of
intravenous contrast.
CONTRAST:  100mL BBZD9D-SRR IOPAMIDOL (BBZD9D-SRR) INJECTION 61%

[Series 2: axial st · axial · 0.97mm/px · z∈[-1126,-646]mm · 13 of 108 slices shown, 15 images]
[im 6/108  soft-tissue]
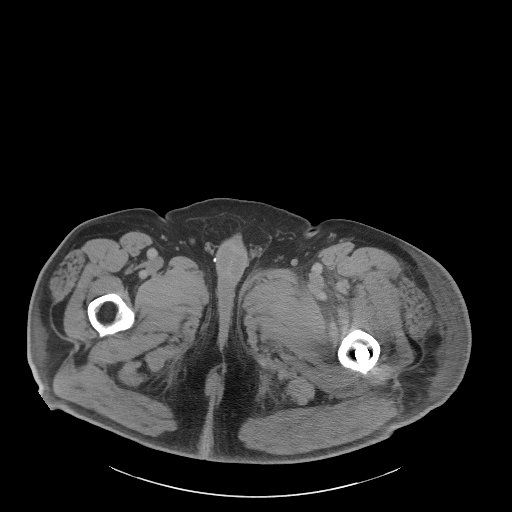
[im 6/108  bone]
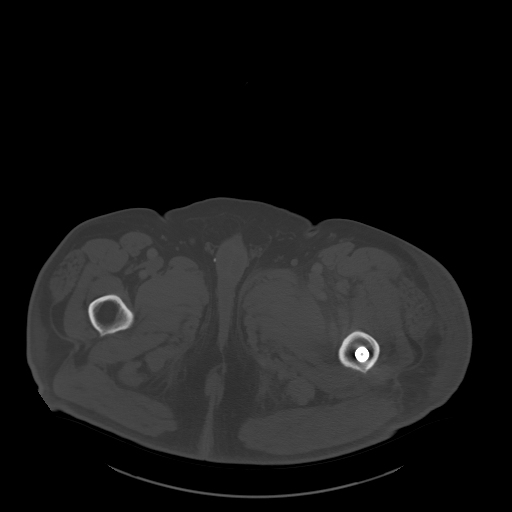
[im 17/108  soft-tissue]
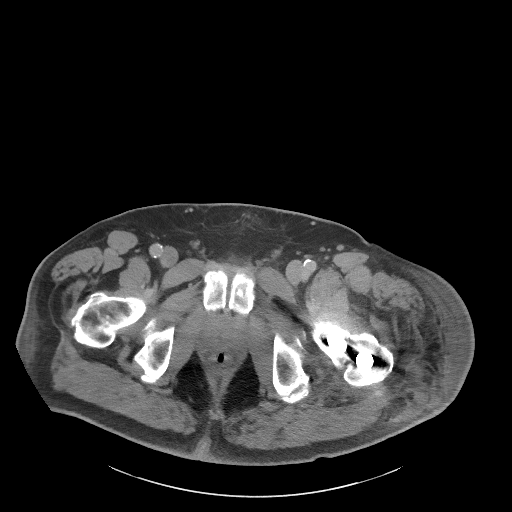
[im 23/108  soft-tissue]
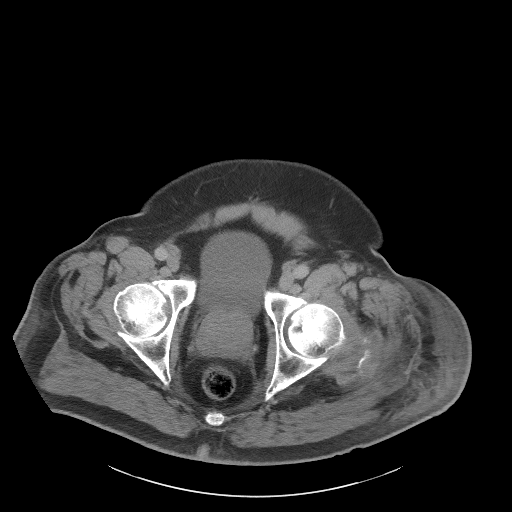
[im 29/108  soft-tissue]
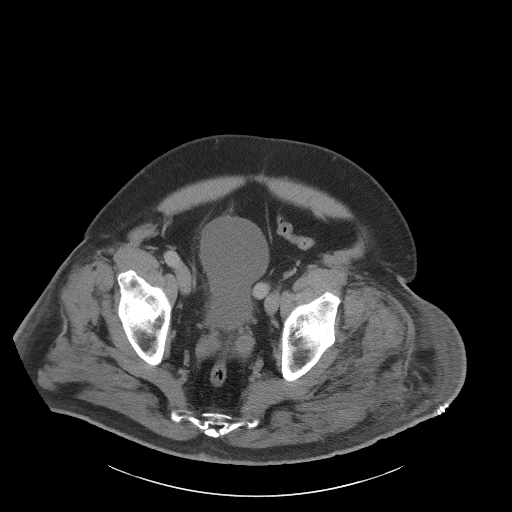
[im 40/108  soft-tissue]
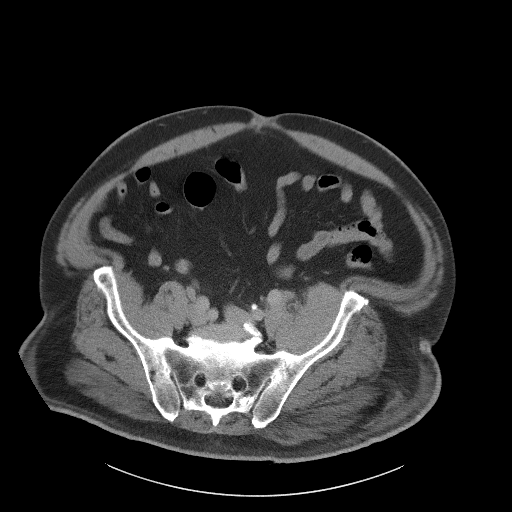
[im 46/108  soft-tissue]
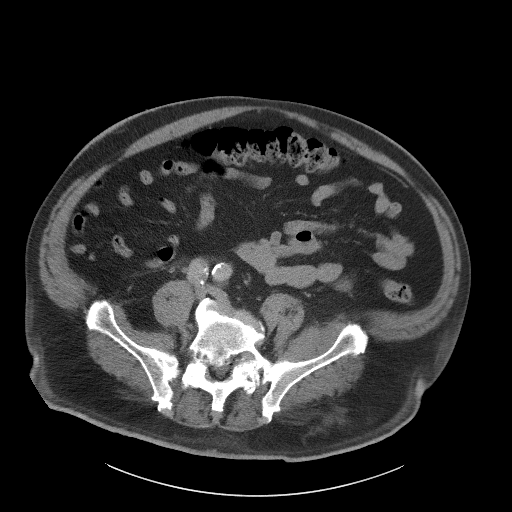
[im 57/108  soft-tissue]
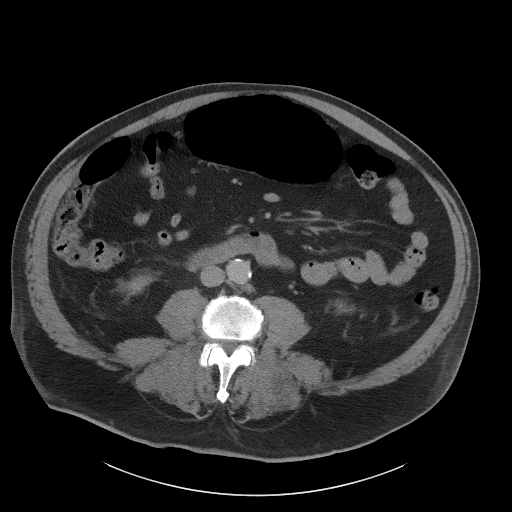
[im 62/108  soft-tissue]
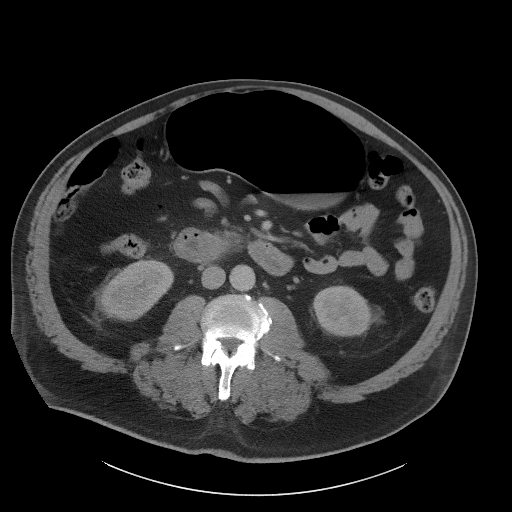
[im 68/108  soft-tissue]
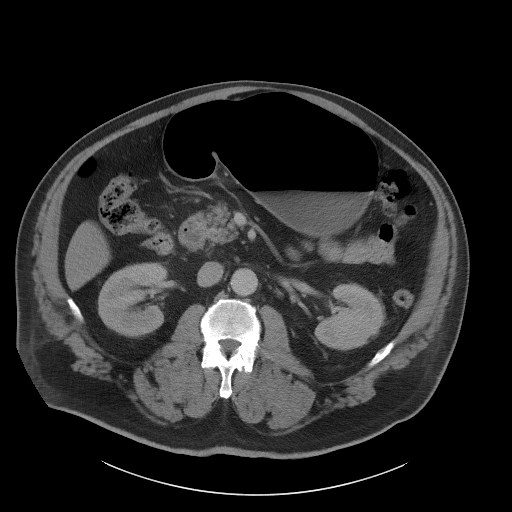
[im 68/108  bone]
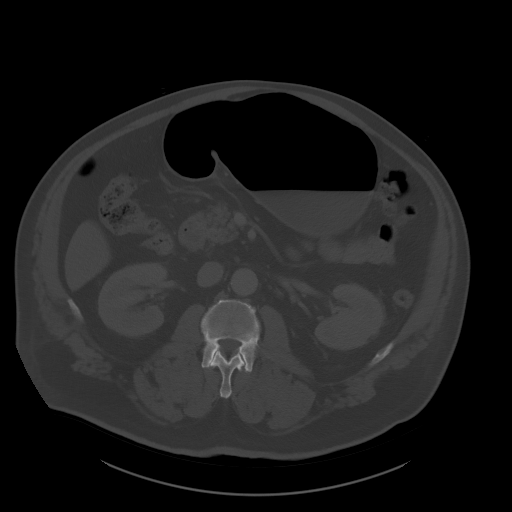
[im 79/108  soft-tissue]
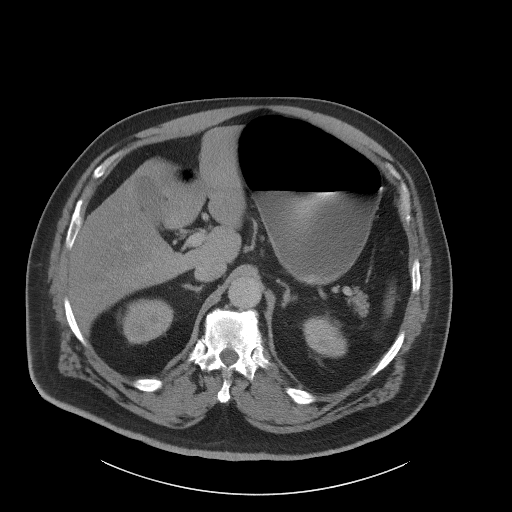
[im 85/108  soft-tissue]
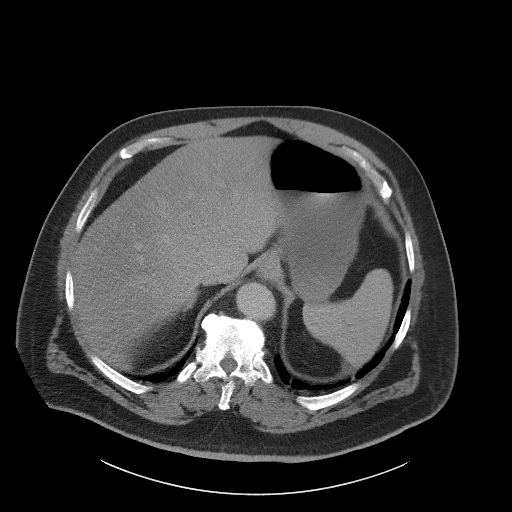
[im 91/108  soft-tissue]
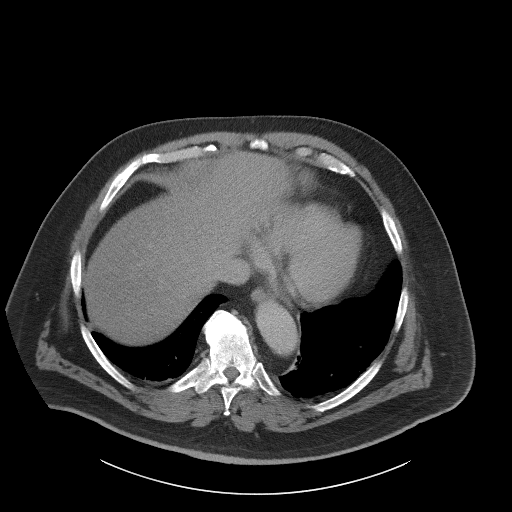
[im 102/108  soft-tissue]
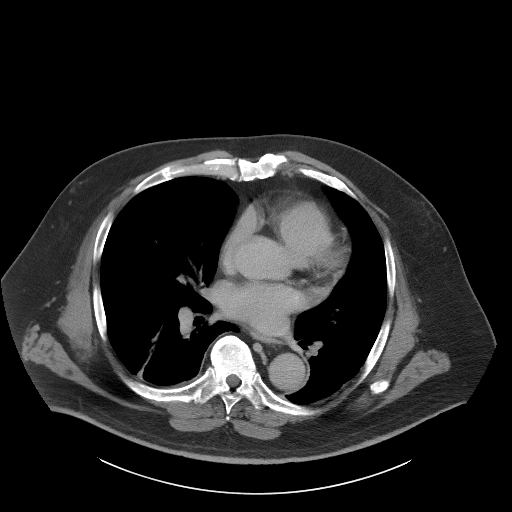

[Series 5: coronal st · coronal · 0.93mm/px · 3 of 123 slices shown]
[im 41/123  soft-tissue]
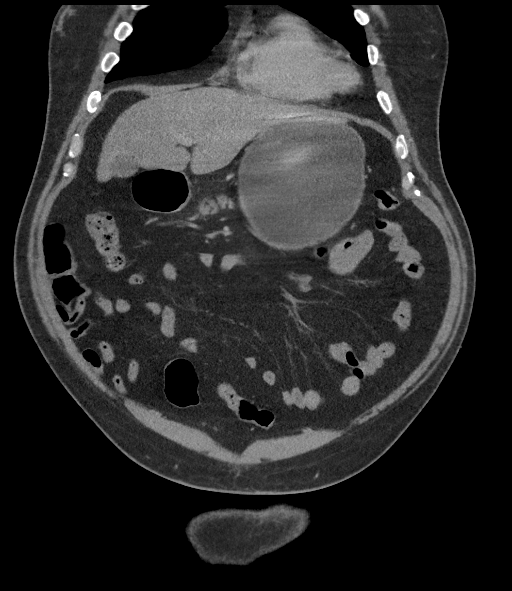
[im 55/123  soft-tissue]
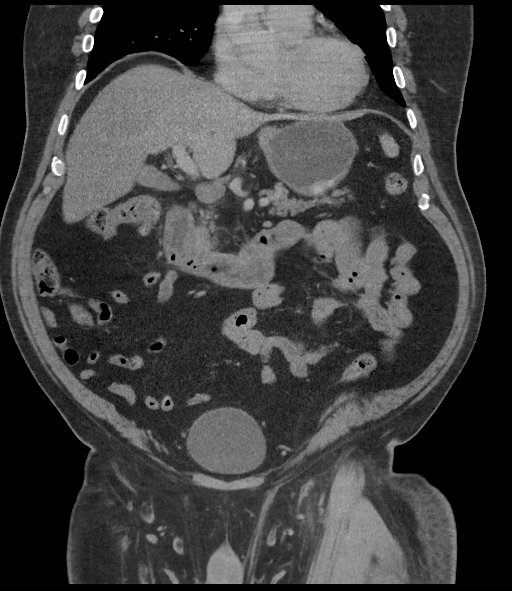
[im 68/123  soft-tissue]
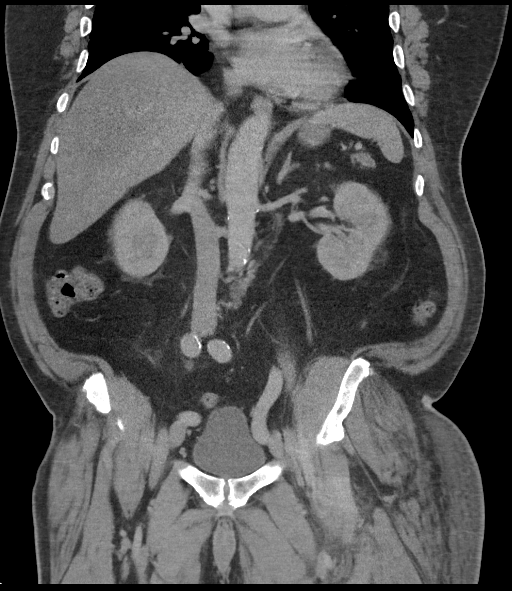

[16 of 46 positions shown; findings below may reference images not displayed]

FINDINGS: Lower chest: There is mild dependent atelectasis of posterior lung
bases. Minimal right pleural effusion is noted.

Hepatobiliary: There is diffuse low density of the liver without
vessel displacement. No focal liver lesion is identified. The
gallbladder is normal.

Pancreas: Unremarkable. No pancreatic ductal dilatation or
surrounding inflammatory changes.

Spleen: Normal in size without focal abnormality.

Adrenals/Urinary Tract: The adrenal glands are normal. There is
chronic bilateral perinephric stranding. There is no hydronephrosis
bilaterally. No focal renal lesion is identified. The bladder is
normal.

Stomach/Bowel: There is mild stranding surrounding the duodenum.
There is no bowel obstruction. The stomach is normal. Colon is
normal. The appendix is normal.

Vascular/Lymphatic: Aortic atherosclerosis. No enlarged abdominal or
pelvic lymph nodes.

Reproductive: Prostate is unremarkable.

Other: No abdominal wall hernia or abnormality. No abdominopelvic
ascites.

Musculoskeletal: No acute or significant osseous findings. Prior
fixation of the left hip is noted. Degenerative joint changes of the
spine are noted.
IMPRESSION: Mild stranding surrounding duodenum consistent with duodenitis.

Fatty infiltration of liver.

Minimal right pleural effusion.

## 2017-08-26 ENCOUNTER — Encounter: Payer: Self-pay | Admitting: Family Medicine

## 2017-08-26 ENCOUNTER — Ambulatory Visit: Payer: Medicare Other | Admitting: Family Medicine

## 2017-08-26 VITALS — BP 124/64 | HR 90 | Temp 98.0°F | Ht 72.0 in | Wt 282.6 lb

## 2017-08-26 DIAGNOSIS — E1165 Type 2 diabetes mellitus with hyperglycemia: Secondary | ICD-10-CM | POA: Diagnosis not present

## 2017-08-26 DIAGNOSIS — I712 Thoracic aortic aneurysm, without rupture, unspecified: Secondary | ICD-10-CM

## 2017-08-26 DIAGNOSIS — E782 Mixed hyperlipidemia: Secondary | ICD-10-CM | POA: Diagnosis not present

## 2017-08-26 DIAGNOSIS — K760 Fatty (change of) liver, not elsewhere classified: Secondary | ICD-10-CM

## 2017-08-26 DIAGNOSIS — E1129 Type 2 diabetes mellitus with other diabetic kidney complication: Secondary | ICD-10-CM

## 2017-08-26 DIAGNOSIS — Z23 Encounter for immunization: Secondary | ICD-10-CM

## 2017-08-26 DIAGNOSIS — R809 Proteinuria, unspecified: Secondary | ICD-10-CM

## 2017-08-26 DIAGNOSIS — D649 Anemia, unspecified: Secondary | ICD-10-CM

## 2017-08-26 DIAGNOSIS — I10 Essential (primary) hypertension: Secondary | ICD-10-CM

## 2017-08-26 MED ORDER — EMPAGLIFLOZIN 10 MG PO TABS: 10.0000 mg | ORAL_TABLET | Freq: Every day | ORAL | 5 refills | Status: DC

## 2017-08-26 NOTE — Assessment & Plan Note (Signed)
Encouraged him to work on weight loss and healthier eating

## 2017-08-26 NOTE — Assessment & Plan Note (Signed)
Seeing vascular doctor 

## 2017-08-26 NOTE — Assessment & Plan Note (Signed)
Check lipids, avoid saturated fats

## 2017-08-26 NOTE — Progress Notes (Signed)
BP 124/64 (BP Location: Left Arm, Patient Position: Sitting, Cuff Size: Large)   Pulse 90   Temp 98 F (36.7 C) (Oral)   Ht 6' (1.829 m)   Wt 282 lb 9.6 oz (128.2 kg)   SpO2 97%   BMI 38.33 kg/m    Subjective:    Patient ID: Edgar [name], male    DOB: July 11, 1946, 72 y.o.   MRN: [id number]  HPI: [name] is a 72 y.o. male  Chief Complaint  Patient presents with  . Follow-up    pt states that he is ready to discuss pain meds  . Immunizations    discuss pna vaccine     HPI Patient is here for follow-up  He is ready to discuss pain medicine; he broke his left thigh in 3 places, 4 screws and 19 stitches; still has a little limp; he is interested in pain medicine; he takes tylenol 500 mg but his wife want him to get off of the tylenol; he said he just wants pain pills; when I offered to refer him to a pain clinic for pain medicine, he said "Lord no, just forget it"; hurts worse when it's cold  He goes to see the specialist next month and get another CT scan  He has type 2 diabetes; he is on insulin and pill; feeling good; he is not checking his sugars; no dry mouth, no blurred vision; he brought in all   Obesity; one bowl of oatmeal in the morning, and he says that's all he eats all day; he is not losing weight; eats a veggie omelet sometimes; he comes home and wife has a little something for him  High cholesterol; stays away from red meat; not much cheese and wieners and steaks  He would like to talk about pneumonia vaccine; due for 23 today  Depression screen [facility] 2/9 08/26/2017 05/14/2017 04/30/2017 10/01/2016 09/11/2016  Decreased Interest 0 0 0 0 0  Down, Depressed, Hopeless 0 0 0 0 0  PHQ - 2 Score 0 0 0 0 0    Relevant past medical, surgical, family and social history reviewed Past Medical History:  Diagnosis Date  . Diabetes mellitus without complication ([place])   . Essential hypertension, benign 12/23/2015  . History of colonoscopy with polypectomy 12/07/2016   Polypectomy x 12 per patient  . Hx of tobacco use, presenting hazards to health 12/23/2015   Quit prior to 1997  . Hyperlipidemia 12/23/2015  . Hypertension   . 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 Obesity 12/23/2015  . Pulmonary embolism ([place]) 05/18/2016   Hospitalization Sept 2017, following hip fracture   Past Surgical History:  Procedure Laterality Date  . COLONOSCOPY WITH PROPOFOL N/A 01/15/2017   Procedure: COLONOSCOPY WITH PROPOFOL;  Surgeon: Jonathon Bellows, MD;  Location: [facility] ENDOSCOPY;  Service: Endoscopy;  Laterality: N/A;  . EYE SURGERY    . INTRAMEDULLARY (IM) NAIL INTERTROCHANTERIC Left 04/14/2016   Procedure: INTRAMEDULLARY (IM) NAIL INTERTROCHANTRIC;  Surgeon: Corky Mull, MD;  Location: [facility] ORS;  Service: Orthopedics;  Laterality: Left;  . JOINT REPLACEMENT Left 04/2016  . TOTAL HIP ARTHROPLASTY     Family History  Problem Relation Age of Onset  . Cancer Mother   . Diabetes Mother   . Heart disease Father   . Alcohol abuse Brother    Social History   Tobacco Use  . Smoking status: Former Research scientist (life sciences)  . Smokeless tobacco: Never Used  Substance Use Topics  .  Alcohol use: No    Alcohol/week: 0.0 oz  . Drug use: No    Interim medical history since last visit reviewed. Allergies and medications reviewed  Review of Systems Per HPI unless specifically indicated above     Objective:    BP 124/64 (BP Location: Left Arm, Patient Position: Sitting, Cuff Size: Large)   Pulse 90   Temp 98 F (36.7 C) (Oral)   Ht 6' (1.829 m)   Wt 282 lb 9.6 oz (128.2 kg)   SpO2 97%   BMI 38.33 kg/m   Wt Readings from Last 3 Encounters:  08/26/17 282 lb 9.6 oz (128.2 kg)  05/30/17 279 lb (126.6 kg)  05/14/17 283 lb 6.4 oz (128.5 kg)    Physical Exam  Constitutional: He appears well-developed and well-nourished. No distress.  obese  HENT:  Head: Normocephalic and atraumatic.  Eyes: EOM are normal. No scleral icterus.  Neck: No thyromegaly  present.  Cardiovascular: Normal rate and regular rhythm.  Pulmonary/Chest: Effort normal and breath sounds normal.  Abdominal: Soft. Bowel sounds are normal. He exhibits no distension.  Musculoskeletal: He exhibits no edema.  Neurological: He is alert.  Skin: Skin is warm and dry. No pallor.  Psychiatric: He has a normal mood and affect. His behavior is normal. Judgment and thought content normal.   Diabetic Foot Form - Detailed   Diabetic Foot Exam - detailed Diabetic Foot exam was performed with the following findings:  Yes 08/26/2017 11:18 AM  Visual Foot Exam completed.:  Yes  Pulse Foot Exam completed.:  Yes  Right Dorsalis Pedis:  Present Left Dorsalis Pedis:  Present  Sensory Foot Exam Completed.:  Yes Semmes-Weinstein Monofilament Test R Site 1-Great Toe:  Pos L Site 1-Great Toe:  Pos        Results for orders placed or performed in visit on 04/30/17  Microalbumin / creatinine urine ratio  Result Value Ref Range   Creatinine, Urine 121 20 - 320 mg/dL   Microalb, Ur 17.5 mg/dL   Microalb Creat Ratio 145 (H) <30 mcg/mg creat  Lipid panel  Result Value Ref Range   Cholesterol 122 <200 mg/dL   HDL 36 (L) >40 mg/dL   Triglycerides 66 <150 mg/dL   LDL Cholesterol (Calc) 71 mg/dL (calc)   Total CHOL/HDL Ratio 3.4 <5.0 (calc)   Non-HDL Cholesterol (Calc) 86 <130 mg/dL (calc)  Hemoglobin A1c  Result Value Ref Range   Hgb A1c MFr Bld 8.2 (H) <5.7 % of total Hgb   Mean Plasma Glucose 189 (calc)   eAG (mmol/L) 10.4 (calc)  COMPLETE METABOLIC PANEL WITH GFR  Result Value Ref Range   Glucose, Bld 133 (H) 65 - 99 mg/dL   BUN 14 7 - 25 mg/dL   Creat 0.80 0.70 - 1.18 mg/dL   GFR, Est Non African American 91 > OR = 60 mL/min/1.45m2   GFR, Est African American 105 > OR = 60 mL/min/1.26m2   BUN/Creatinine Ratio NOT APPLICABLE 6 - 22 (calc)   Sodium 138 135 - 146 mmol/L   Potassium 4.5 3.5 - 5.3 mmol/L   Chloride 104 98 - 110 mmol/L   CO2 25 20 - 32 mmol/L   Calcium 10.1 8.6 -  10.3 mg/dL   Total Protein 7.3 6.1 - 8.1 g/dL   Albumin 3.9 3.6 - 5.1 g/dL   Globulin 3.4 1.9 - 3.7 g/dL (calc)   AG Ratio 1.1 1.0 - 2.5 (calc)   Total Bilirubin 0.6 0.2 - 1.2 mg/dL   Alkaline phosphatase (  APISO) 79 40 - 115 U/L   AST 14 10 - 35 U/L   ALT 11 9 - 46 U/L      Assessment & Plan:   Problem List Items Addressed This Visit      Cardiovascular and Mediastinum   Essential hypertension, benign (Chronic)    Well-controlled      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)    Seeing vascular doctor        Digestive   Fatty liver    Encouraged him to work on weight loss and healthier eating      Relevant Orders   COMPLETE METABOLIC PANEL WITH GFR   TSH     Endocrine   Type II diabetes mellitus, uncontrolled (HCC) - Primary (Chronic)    Check A1c; foot exam by MD      Relevant Medications   empagliflozin (JARDIANCE) 10 MG TABS tablet   Other Relevant Orders   Hemoglobin A1c   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HCC)    Check urine, work to control pressure and diabetes      Relevant Medications   empagliflozin (JARDIANCE) 10 MG TABS tablet   Other Relevant Orders   Microalbumin / creatinine urine ratio     Other   Morbid obesity ([place])    Encouraged him to split his one meal a day into 3 meals; limit total calories; see AVS      Relevant Medications   empagliflozin (JARDIANCE) 10 MG TABS tablet   Other Relevant Orders   TSH   Hyperlipidemia    Check lipids, avoid saturated fats      Relevant Orders   Lipid panel    Other Visit Diagnoses    Need for 23-polyvalent pneumococcal polysaccharide vaccine       Anemia, unspecified type       Relevant Orders   CBC with Differential/Platelet       Follow up plan: Return in about 3 months (around 11/24/2017) for follow-up visit with Dr. Sanda Klein.  An after-visit summary was printed and given to the patient at [facility].  Please see the patient instructions which may contain other information and  recommendations beyond what is mentioned above in the assessment and plan.  Meds ordered this encounter  Medications  . empagliflozin (JARDIANCE) 10 MG TABS tablet    Sig: Take 10 mg by mouth daily.    Dispense:  30 tablet    Refill:  5    Orders Placed This Encounter  Procedures  . Pneumococcal polysaccharide vaccine 23-valent greater than or equal to 2yo subcutaneous/IM  . Microalbumin / creatinine urine ratio  . Lipid panel  . Hemoglobin A1c  . COMPLETE METABOLIC PANEL WITH GFR  . TSH  . CBC with Differential/Platelet

## 2017-08-26 NOTE — Assessment & Plan Note (Signed)
Well controlled 

## 2017-08-26 NOTE — Patient Instructions (Signed)
You have received the Pneumovax vaccine (PPSV-23) and you will not need another booster of this for the rest of your life per current ACIP guidelines Check out the information at familydoctor.org entitled "Nutrition for Weight Loss: What You Need to Know about Fad Diets" Try to lose between 1-2 pounds per week by taking in fewer calories and burning off more calories You can succeed by limiting portions, limiting foods dense in calories and fat, becoming more active, and drinking 8 glasses of water a day (64 ounces) Don't skip meals, especially breakfast, as skipping meals may alter your metabolism Do not use over-the-counter weight loss pills or gimmicks that claim rapid weight loss A healthy BMI (or body mass index) is between 18.5 and 24.9 You can calculate your ideal BMI at the [place] website [employer]fr

## 2017-08-26 NOTE — Assessment & Plan Note (Signed)
Check urine, work to control pressure and diabetes

## 2017-08-26 NOTE — Assessment & Plan Note (Signed)
Check A1c; foot exam by MD 

## 2017-08-26 NOTE — Assessment & Plan Note (Signed)
Encouraged him to split his one meal a day into 3 meals; limit total calories; see AVS

## 2017-08-27 ENCOUNTER — Other Ambulatory Visit: Payer: Self-pay | Admitting: Family Medicine

## 2017-08-27 MED ORDER — INSULIN LISPRO 100 UNIT/ML (KWIKPEN): 7.0000 [IU] | PEN_INJECTOR | Freq: Three times a day (TID) | SUBCUTANEOUS | 11 refills | Status: DC

## 2017-08-27 MED ORDER — EMPAGLIFLOZIN 25 MG PO TABS: 25.0000 mg | ORAL_TABLET | Freq: Every day | ORAL | 5 refills | Status: DC

## 2017-08-28 ENCOUNTER — Telehealth: Payer: Self-pay

## 2017-08-28 NOTE — Telephone Encounter (Signed)
-----   Message from Arnetha Courser, MD sent at 08/27/2017  7:15 PM EST ----- Guerry Minors, please let Mr. Edgar [name] know that he is still spilling protein through his kidneys; cut down on red meat and really work on weight loss His A1c is high, and his diabetes is out of control; he really needs to lose weight to get this from spiraling out of control; I'm going to increase his Jardiance from 10 mg daily to 25 mg daily (new Rx sent); don't take both the 10 and 25 mg together; JUST the 25 mg Increase the meal-time insulin from five units each meal three times a day to SEVEN units with each meal three times a day Check FSBS TID AC and call us in one week with readings so I can adjust his insulin further

## 2017-08-28 NOTE — Telephone Encounter (Signed)
Called pt, no answer. LM for pt informing him of information below. Advised pt to call back for questions or concerns. CRM created. Labs routed to [facility].

## 2017-08-28 NOTE — Progress Notes (Signed)
Spoke with [place], lab tech who states he will call [facility] to have tests added on.

## 2017-08-30 LAB — COMPLETE METABOLIC PANEL WITH GFR
AG Ratio: 1.1 (calc) (ref 1.0–2.5)
ALT: 15 U/L (ref 9–46)
AST: 17 U/L (ref 10–35)
Albumin: 4.2 g/dL (ref 3.6–5.1)
Alkaline phosphatase (APISO): 82 U/L (ref 40–115)
BUN: 14 mg/dL (ref 7–25)
CO2: 28 mmol/L (ref 20–32)
Calcium: 10.7 mg/dL — ABNORMAL HIGH (ref 8.6–10.3)
Chloride: 102 mmol/L (ref 98–110)
Creat: 1.08 mg/dL (ref 0.70–1.18)
GFR, Est African American: 80 mL/min/{1.73_m2} (ref >=60)
GFR, Est Non African American: 69 mL/min/{1.73_m2} (ref >=60)
Globulin: 3.7 g/dL (calc) (ref 1.9–3.7)
Glucose, Bld: 110 mg/dL — ABNORMAL HIGH (ref 65–99)
Potassium: 4.3 mmol/L (ref 3.5–5.3)
Sodium: 137 mmol/L (ref 135–146)
Total Bilirubin: 0.8 mg/dL (ref 0.2–1.2)
Total Protein: 7.9 g/dL (ref 6.1–8.1)

## 2017-08-30 LAB — LIPID PANEL
Cholesterol: 131 mg/dL
HDL: 38 mg/dL — ABNORMAL LOW
LDL Cholesterol (Calc): 77 mg/dL
Non-HDL Cholesterol (Calc): 93 mg/dL
Total CHOL/HDL Ratio: 3.4 (calc)
Triglycerides: 76 mg/dL

## 2017-08-30 LAB — HEMOGLOBIN A1C
Hgb A1c MFr Bld: 9.7 % of total Hgb — ABNORMAL HIGH (ref <5.7)
Mean Plasma Glucose: 232 (calc)
eAG (mmol/L): 12.8 (calc)

## 2017-08-30 LAB — CBC WITH DIFFERENTIAL/PLATELET
Basophils Absolute: 61 cells/uL (ref 0–200)
Basophils Relative: 0.9 %
Eosinophils Absolute: 170 cells/uL (ref 15–500)
Eosinophils Relative: 2.5 %
HCT: 43.9 % (ref 38.5–50.0)
Hemoglobin: 14.8 g/dL (ref 13.2–17.1)
Lymphs Abs: 2380 cells/uL (ref 850–3900)
MCH: 28.6 pg (ref 27.0–33.0)
MCHC: 33.7 g/dL (ref 32.0–36.0)
MCV: 84.7 fL (ref 80.0–100.0)
MPV: 9.4 fL (ref 7.5–12.5)
Monocytes Relative: 6.6 %
Neutro Abs: 3740 cells/uL (ref 1500–7800)
Neutrophils Relative %: 55 %
Platelets: 269 10*3/uL (ref 140–400)
RBC: 5.18 10*6/uL (ref 4.20–5.80)
RDW: 12.8 % (ref 11.0–15.0)
Total Lymphocyte: 35 %
WBC mixed population: 449 cells/uL (ref 200–950)
WBC: 6.8 10*3/uL (ref 3.8–10.8)

## 2017-08-30 LAB — MICROALBUMIN / CREATININE URINE RATIO
Creatinine, Urine: 250 mg/dL (ref 20–320)
Microalb Creat Ratio: 149 ug/mg{creat} — ABNORMAL HIGH
Microalb, Ur: 37.3 mg/dL

## 2017-08-30 LAB — TEST AUTHORIZATION

## 2017-08-30 LAB — PHOSPHORUS: Phosphorus: 3.5 mg/dL (ref 2.1–4.3)

## 2017-08-30 LAB — VITAMIN D 25 HYDROXY (VIT D DEFICIENCY, FRACTURES): Vit D, 25-Hydroxy: 52 ng/mL (ref 30–100)

## 2017-08-30 LAB — TSH: TSH: 2.71 mIU/L (ref 0.40–4.50)

## 2017-09-06 ENCOUNTER — Telehealth: Payer: Self-pay | Admitting: Family Medicine

## 2017-09-06 NOTE — Telephone Encounter (Signed)
Called pt. No answer. LM for pt informing him of the information below. Advised pt to call back for questions or concerns. CRM created.

## 2017-09-06 NOTE — Telephone Encounter (Signed)
Thank him for calling back with his numbers Let's increase meal-time insulin from SEVEN to EIGHT units with breakfast, lunch, and evening meal Keep trying to lose weight Check sugars before meals and call back in 7-10 days Call sooner and decrease insulin back down if numbers drop low Thank you

## 2017-09-06 NOTE — Telephone Encounter (Signed)
Copied from [place]. Topic: Quick Communication - See Telephone Encounter >> Sep 06, 2017 11:12 AM Margot Ables wrote: CRM for notification. See Telephone encounter for: pt called to report blood sugar levels. Takes in the morning after shot at 9:00 before eating, 12:00 before eating, 4:30-5:00 before eating  09/02/17 113, 148, 141 09/03/17 161, 193, 128 09/04/17 146, 162, 105 09/05/17 153, 111, 95 09/06/17 142  09/06/17.

## 2017-09-06 NOTE — Telephone Encounter (Signed)
Please see below and advise.

## 2017-09-17 ENCOUNTER — Encounter: Payer: Self-pay | Admitting: Family Medicine

## 2017-09-24 ENCOUNTER — Ambulatory Visit (INDEPENDENT_AMBULATORY_CARE_PROVIDER_SITE_OTHER): Payer: Medicare Other | Admitting: Vascular Surgery

## 2017-09-24 ENCOUNTER — Other Ambulatory Visit: Payer: Self-pay | Admitting: Family Medicine

## 2017-09-24 ENCOUNTER — Other Ambulatory Visit (INDEPENDENT_AMBULATORY_CARE_PROVIDER_SITE_OTHER): Payer: Self-pay | Admitting: Vascular Surgery

## 2017-09-24 DIAGNOSIS — I712 Thoracic aortic aneurysm, without rupture, unspecified: Secondary | ICD-10-CM

## 2017-09-24 HISTORY — DX: Thoracic aortic aneurysm, without rupture, unspecified: I71.20

## 2017-09-24 HISTORY — DX: Thoracic aortic aneurysm, without rupture: I71.2

## 2017-10-09 ENCOUNTER — Ambulatory Visit: Payer: Medicare Other | Admitting: Family Medicine

## 2017-10-09 ENCOUNTER — Encounter: Payer: Self-pay | Admitting: Family Medicine

## 2017-10-09 DIAGNOSIS — E1165 Type 2 diabetes mellitus with hyperglycemia: Secondary | ICD-10-CM | POA: Diagnosis not present

## 2017-10-09 DIAGNOSIS — I712 Thoracic aortic aneurysm, without rupture, unspecified: Secondary | ICD-10-CM

## 2017-10-09 DIAGNOSIS — S72142S Displaced intertrochanteric fracture of left femur, sequela: Secondary | ICD-10-CM

## 2017-10-09 DIAGNOSIS — Z86711 Personal history of pulmonary embolism: Secondary | ICD-10-CM | POA: Diagnosis not present

## 2017-10-09 NOTE — Assessment & Plan Note (Signed)
No issues with breathing, completed anticoagulation

## 2017-10-09 NOTE — Patient Instructions (Addendum)
Hypoglycemia Hypoglycemia is when the sugar (glucose) level in the blood is too low. Symptoms of low blood sugar may include:  Feeling: ? Hungry. ? Worried or nervous (anxious). ? Sweaty and clammy. ? Confused. ? Dizzy. ? Sleepy. ? Sick to your stomach (nauseous).  Having: ? A fast heartbeat. ? A headache. ? A change in your vision. ? Jerky movements that you cannot control (seizure). ? Nightmares. ? Tingling or no feeling (numbness) around the mouth, lips, or tongue.  Having trouble with: ? Talking. ? Paying attention (concentrating). ? Moving (coordination). ? Sleeping.  Shaking.  Passing out (fainting).  Getting upset easily (irritability).  Low blood sugar can happen to people who have diabetes and people who do not have diabetes. Low blood sugar can happen quickly, and it can be an emergency. Treating Low Blood Sugar Low blood sugar is often treated by eating or drinking something sugary right away. If you can think clearly and swallow safely, follow the 15:15 rule:  Take 15 grams of a fast-acting carb (carbohydrate). Some fast-acting carbs are: ? 1 tube of glucose gel. ? 3 sugar tablets (glucose pills). ? 6-8 pieces of hard candy. ? 4 oz (120 mL) of fruit juice. ? 4 oz (120 mL) of regular (not diet) soda.  Check your blood sugar 15 minutes after you take the carb.  If your blood sugar is still at or below 70 mg/dL (3.9 mmol/L), take 15 grams of a carb again.  If your blood sugar does not go above 70 mg/dL (3.9 mmol/L) after 3 tries, get help right away.  After your blood sugar goes back to normal, eat a meal or a snack within 1 hour.  Treating Very Low Blood Sugar If your blood sugar is at or below 54 mg/dL (3 mmol/L), you have very low blood sugar (severe hypoglycemia). This is an emergency. Do not wait to see if the symptoms will go away. Get medical help right away. Call your local emergency services (911 in the [place] Do not drive yourself to the  hospital. If you have very low blood sugar and you cannot eat or drink, you may need a glucagon shot (injection). A family member or friend should learn how to check your blood sugar and how to give you a glucagon shot. Ask your doctor if you need to have a glucagon shot kit at home. Follow these instructions at home: General instructions  Avoid any diets that cause you to not eat enough food. Talk with your doctor before you start any new diet.  Take over-the-counter and prescription medicines only as told by your doctor.  Limit alcohol to no more than 1 drink per day for nonpregnant women and 2 drinks per day for men. One drink equals 12 oz of beer, 5 oz of wine, or 1 oz of hard liquor.  Keep all follow-up visits as told by your doctor. This is important. If You Have Diabetes:   Make sure you know the symptoms of low blood sugar.  Always keep a source of sugar with you, such as: ? Sugar. ? Sugar tablets. ? Glucose gel. ? Fruit juice. ? Regular soda (not diet soda). ? Milk. ? Hard candy. ? Honey.  Take your medicines as told.  Follow your exercise and meal plan. ? Eat on time. Do not skip meals. ? Follow your sick day plan when you cannot eat or drink normally. Make this plan ahead of time with your doctor.  Check your blood sugar as often   as told by your doctor. Always check before and after exercise.  Share your diabetes care plan with: ? Your work or school. ? People you live with.  Check your pee (urine) for ketones: ? When you are sick. ? As told by your doctor.  Carry a card or wear jewelry that says you have diabetes. If You Have Low Blood Sugar From Other Causes:   Check your blood sugar as often as told by your doctor.  Follow instructions from your doctor about what you cannot eat or drink. Contact a doctor if:  You have trouble keeping your blood sugar in your target range.  You have low blood sugar often. Get help right away if:  You still have  symptoms after you eat or drink something sugary.  Your blood sugar is at or below 54 mg/dL (3 mmol/L).  You have jerky movements that you cannot control.  You pass out. These symptoms may be an emergency. Do not wait to see if the symptoms will go away. Get medical help right away. Call your local emergency services (911 in the [place] Do not drive yourself to the hospital. This information is not intended to replace advice given to you by your health care provider. Make sure you discuss any questions you have with your health care provider. Document Released: 10/17/2009 Document Revised: 12/29/2015 Document Reviewed: 08/26/2015 Elsevier Interactive Patient Education  2018 [employer]  

## 2017-10-09 NOTE — Progress Notes (Signed)
BP 120/66   Pulse 86   Temp 98.2 F (36.8 C)   Resp 16   Ht 6' (1.829 m)   Wt 274 lb 6.4 oz (124.5 kg)   SpO2 96%   BMI 37.22 kg/m    Subjective:    Patient ID: Edgar [name], male    DOB: 1945/11/25, 72 y.o.   MRN: [id number]  HPI: Edgar [name] is a 72 y.o. male  Chief Complaint  Patient presents with  . Follow-up  . Forms    DMV/handicap  . 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 Roland Rack; he says he has pain in the knee; thinks that is arthritis Left hip fracture; 4 screws and 19 stitches; displaced 4 part intertrochanteric fracture left hip; reduction and internal fixation of left hip fracture with TFN nail The hip causes him to walk slowly; causes him pain; uses cane, slower than family He also has pain in his left knee but that is arthritis he thinks Using a cane or walker at time He has sinus issues at this time of year; beginning and end of winter; congestion; using nasal spray;  No problems with his feet; type 2 diabetes; he does not know how long he has had diabetes; he does not know the low blood sugars; checking sugars occasionally; last night 112, trying to keep it controlled; watching a good diet Lab Results  Component Value Date   HGBA1C 9.7 (H) 08/26/2017   Hx of PE; finished anticoagulation; no issues with his breathing Seeing specialist for throat issue; he will go back and see them Aortic aneurysm; discovered on CT scan; now seeing vascular doctor  Depression screen [facility] 2/9 10/09/2017 08/26/2017 05/14/2017 04/30/2017 10/01/2016  Decreased Interest 0 0 0 0 0  Down, Depressed, Hopeless 0 0 0 0 0  PHQ - 2 Score 0 0 0 0 0    Relevant past medical, surgical, family and social history reviewed Past Medical History:  Diagnosis Date   . Diabetes mellitus without complication ([place])   . Essential hypertension, benign 12/23/2015  . History of colonoscopy with polypectomy 12/07/2016   Polypectomy x 12 per patient  . Hx of tobacco use, presenting hazards to health 12/23/2015   Quit prior to 1997  . Hyperlipidemia 12/23/2015  . Hypertension   . 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[place]) 05/14/2017  . Obesity 12/23/2015  . Pulmonary embolism ([place]) 05/18/2016   Hospitalization Sept 2017, following hip fracture   Past Surgical History:  Procedure Laterality Date  . COLONOSCOPY WITH PROPOFOL N/A 01/15/2017   Procedure: COLONOSCOPY WITH PROPOFOL;  Surgeon: Jonathon Bellows, MD;  Location: [facility] ENDOSCOPY;  Service: Endoscopy;  Laterality: N/A;  . EYE SURGERY    . INTRAMEDULLARY (IM) NAIL INTERTROCHANTERIC Left 04/14/2016   Procedure: INTRAMEDULLARY (IM) NAIL INTERTROCHANTRIC;  Surgeon: Corky Mull, MD;  Location: [facility] ORS;  Service: Orthopedics;  Laterality: Left;  . JOINT REPLACEMENT Left 04/2016  . TOTAL HIP ARTHROPLASTY     Family History  Problem Relation Age of Onset  . Cancer Mother   . Diabetes Mother   . Heart disease Father   . Alcohol abuse Brother    Social History   Tobacco Use  . Smoking status: Former Research scientist (life sciences)  .  Smokeless tobacco: Never Used  Substance Use Topics  . Alcohol use: No    Alcohol/week: 0.0 oz  . Drug use: No    Interim medical history since last visit reviewed. Allergies and medications reviewed  Review of Systems Per HPI unless specifically indicated above     Objective:    BP 120/66   Pulse 86   Temp 98.2 F (36.8 C)   Resp 16   Ht 6' (1.829 m)   Wt 274 lb 6.4 oz (124.5 kg)   SpO2 96%   BMI 37.22 kg/m   Wt Readings from Last 3 Encounters:  10/09/17 274 lb 6.4 oz (124.5 kg)  08/26/17 282 lb 9.6 oz (128.2 kg)  05/30/17 279 lb (126.6 kg)    Physical Exam  Constitutional: He appears well-developed and well-nourished. No distress.  Eyes: No scleral icterus.    Cardiovascular: Normal rate and regular rhythm.  Pulmonary/Chest: Effort normal and breath sounds normal.  Neurological: He is alert.  Skin: No pallor.  Psychiatric: He has a normal mood and affect.   Diabetic Foot Form - Detailed   Diabetic Foot Exam - detailed Diabetic Foot exam was performed with the following findings:  Yes 10/09/2017  1:43 PM  Visual Foot Exam completed.:  Yes  Pulse Foot Exam completed.:  Yes  Sensory Foot Exam Completed.:  Yes Semmes-Weinstein Monofilament Test       Results for orders placed or performed in visit on 08/26/17  Microalbumin / creatinine urine ratio  Result Value Ref Range   Creatinine, Urine 250 20 - 320 mg/dL   Microalb, Ur 37.3 mg/dL   Microalb Creat Ratio 149 (H) <30 mcg/mg creat  Lipid panel  Result Value Ref Range   Cholesterol 131 <200 mg/dL   HDL 38 (L) >40 mg/dL   Triglycerides 76 <150 mg/dL   LDL Cholesterol (Calc) 77 mg/dL (calc)   Total CHOL/HDL Ratio 3.4 <5.0 (calc)   Non-HDL Cholesterol (Calc) 93 <130 mg/dL (calc)  Hemoglobin A1c  Result Value Ref Range   Hgb A1c MFr Bld 9.7 (H) <5.7 % of total Hgb   Mean Plasma Glucose 232 (calc)   eAG (mmol/L) 12.8 (calc)  COMPLETE METABOLIC PANEL WITH GFR  Result Value Ref Range   Glucose, Bld 110 (H) 65 - 99 mg/dL   BUN 14 7 - 25 mg/dL   Creat 1.08 0.70 - 1.18 mg/dL   GFR, Est Non African American 69 > OR = 60 mL/min/1.65m2   GFR, Est African American 80 > OR = 60 mL/min/1.62m2   BUN/Creatinine Ratio NOT APPLICABLE 6 - 22 (calc)   Sodium 137 135 - 146 mmol/L   Potassium 4.3 3.5 - 5.3 mmol/L   Chloride 102 98 - 110 mmol/L   CO2 28 20 - 32 mmol/L   Calcium 10.7 (H) 8.6 - 10.3 mg/dL   Total Protein 7.9 6.1 - 8.1 g/dL   Albumin 4.2 3.6 - 5.1 g/dL   Globulin 3.7 1.9 - 3.7 g/dL (calc)   AG Ratio 1.1 1.0 - 2.5 (calc)   Total Bilirubin 0.8 0.2 - 1.2 mg/dL   Alkaline phosphatase (APISO) 82 40 - 115 U/L   AST 17 10 - 35 U/L   ALT 15 9 - 46 U/L  TSH  Result Value Ref Range   TSH  2.71 0.40 - 4.50 mIU/L  CBC with Differential/Platelet  Result Value Ref Range   WBC 6.8 3.8 - 10.8 Thousand/uL   RBC 5.18 4.20 - 5.80 Million/uL   Hemoglobin 14.8  13.2 - 17.1 g/dL   HCT 43.9 38.5 - 50.0 %   MCV 84.7 80.0 - 100.0 fL   MCH 28.6 27.0 - 33.0 pg   MCHC 33.7 32.0 - 36.0 g/dL   RDW 12.8 11.0 - 15.0 %   Platelets 269 140 - 400 Thousand/uL   MPV 9.4 7.5 - 12.5 fL   Neutro Abs 3,740 1,500 - 7,800 cells/uL   Lymphs Abs 2,380 850 - 3,900 cells/uL   WBC mixed population 449 200 - 950 cells/uL   Eosinophils Absolute 170 15 - 500 cells/uL   Basophils Absolute 61 0 - 200 cells/uL   Neutrophils Relative % 55 %   Total Lymphocyte 35.0 %   Monocytes Relative 6.6 %   Eosinophils Relative 2.5 %   Basophils Relative 0.9 %  VITAMIN D 25 Hydroxy (Vit-D Deficiency, Fractures)  Result Value Ref Range   Vit D, 25-Hydroxy 52 30 - 100 ng/mL  Phosphorus  Result Value Ref Range   Phosphorus 3.5 2.1 - 4.3 mg/dL  TEST AUTHORIZATION  Result Value Ref Range   TEST NAME: VITAMIN D,25-OH,TOTAL,IA PHOS    TEST CODE: [id number]    CLIENT CONTACT: [place] WRAY    REPORT ALWAYS MESSAGE SIGNATURE        Assessment & Plan:   Problem List Items Addressed This Visit      Cardiovascular and Mediastinum   Aneurysm of aorta ([place])    Managed by vascular doctor; keep regular follow-up for that; explained importance of keeping an eye on the size; if it gets too large, risk of rupture and death      Relevant Orders   CT Angio Chest W/Cm &/Or Wo Cm     Endocrine   Type II diabetes mellitus, uncontrolled ([place]) (Chronic)    Uncontrolled; due for next a1c in April; advised patient on s/s of hypoglycemia; reviewed risk of car accident; importance of immediately pulling off the road safely; eat something, check FSBS; do not drive until sugar and symptoms improved        Musculoskeletal and Integument   Closed intertrochanteric fracture of left hip, sequela    Continued pain; using walker or  cane; forms completed        Other   Hx of pulmonary embolus    No issues with breathing, completed anticoagulation      Relevant Orders   CT Angio Chest W/Cm &/Or Wo Cm       Follow up plan: No Follow-up on file.  An after-visit summary was printed and given to the patient at [facility].  Please see the patient instructions which may contain other information and recommendations beyond what is mentioned above in the assessment and plan.  No orders of the defined types were placed in this encounter.   Orders Placed This Encounter  Procedures  . CT Angio Chest W/Cm &/Or Wo Cm

## 2017-10-09 NOTE — Assessment & Plan Note (Signed)
Managed by vascular doctor; no chest pain

## 2017-10-09 NOTE — Assessment & Plan Note (Addendum)
Uncontrolled; due for next a1c in April; advised patient on s/s of hypoglycemia; reviewed risk of car accident; importance of immediately pulling off the road safely; eat something, check FSBS; do not drive until sugar and symptoms improved

## 2017-10-09 NOTE — Assessment & Plan Note (Signed)
Continued pain; using walker or cane; forms completed

## 2017-10-09 NOTE — Assessment & Plan Note (Addendum)
Managed by vascular doctor; keep regular follow-up for that; explained importance of keeping an eye on the size; if it gets too large, risk of rupture and death

## 2017-10-25 ENCOUNTER — Ambulatory Visit
Admission: RE | Admit: 2017-10-25 | Discharge: 2017-10-25 | Disposition: A | Discharge status: Home / Self Care | Payer: Medicare Other | Source: Ambulatory Visit | Attending: Family Medicine | Admitting: Family Medicine

## 2017-10-25 ENCOUNTER — Encounter: Payer: Self-pay | Admitting: Family Medicine

## 2017-10-25 DIAGNOSIS — Z86711 Personal history of pulmonary embolism: Secondary | ICD-10-CM | POA: Insufficient documentation

## 2017-10-25 DIAGNOSIS — I2510 Atherosclerotic heart disease of native coronary artery without angina pectoris: Secondary | ICD-10-CM

## 2017-10-25 DIAGNOSIS — I712 Thoracic aortic aneurysm, without rupture, unspecified: Secondary | ICD-10-CM

## 2017-10-25 HISTORY — DX: Atherosclerotic heart disease of native coronary artery without angina pectoris: I25.10

## 2017-10-25 LAB — POCT I-STAT CREATININE: Creatinine, Ser: 1.2 mg/dL (ref 0.61–1.24)

## 2017-10-25 MED ORDER — IOPAMIDOL (ISOVUE-370) INJECTION 76%
75.0000 mL | Freq: Once | INTRAVENOUS | Status: AC | PRN
  Administered 2017-10-25: 75 mL via INTRAVENOUS

## 2017-10-28 ENCOUNTER — Ambulatory Visit: Payer: Medicare Other

## 2017-11-25 ENCOUNTER — Ambulatory Visit: Payer: Medicare Other | Admitting: Family Medicine

## 2017-11-25 ENCOUNTER — Encounter: Payer: Self-pay | Admitting: Family Medicine

## 2017-11-25 VITALS — BP 132/84 | HR 88 | Temp 98.3°F | Resp 14 | Ht 72.0 in | Wt 273.8 lb

## 2017-11-25 DIAGNOSIS — I10 Essential (primary) hypertension: Secondary | ICD-10-CM | POA: Diagnosis not present

## 2017-11-25 DIAGNOSIS — L0211 Cutaneous abscess of neck: Secondary | ICD-10-CM | POA: Diagnosis not present

## 2017-11-25 DIAGNOSIS — E1129 Type 2 diabetes mellitus with other diabetic kidney complication: Secondary | ICD-10-CM | POA: Diagnosis not present

## 2017-11-25 DIAGNOSIS — I712 Thoracic aortic aneurysm, without rupture, unspecified: Secondary | ICD-10-CM

## 2017-11-25 DIAGNOSIS — K760 Fatty (change of) liver, not elsewhere classified: Secondary | ICD-10-CM | POA: Diagnosis not present

## 2017-11-25 DIAGNOSIS — I2510 Atherosclerotic heart disease of native coronary artery without angina pectoris: Secondary | ICD-10-CM | POA: Diagnosis not present

## 2017-11-25 DIAGNOSIS — R809 Proteinuria, unspecified: Secondary | ICD-10-CM

## 2017-11-25 DIAGNOSIS — E1165 Type 2 diabetes mellitus with hyperglycemia: Secondary | ICD-10-CM

## 2017-11-25 MED ORDER — INSULIN GLARGINE 100 UNIT/ML SOLOSTAR PEN: 60.0000 [IU] | PEN_INJECTOR | Freq: Every day | SUBCUTANEOUS | 5 refills | Status: DC

## 2017-11-25 NOTE — Assessment & Plan Note (Signed)
Working on weight loss, low fat diet

## 2017-11-25 NOTE — Patient Instructions (Addendum)
We'll check labs today If you have not heard anything from my staff in a week about any orders/referrals/studies from today, please contact us here to follow-up [phone] Check out the information at familydoctor.org entitled "Nutrition for Weight Loss: What You Need to Know about Fad Diets" Try to lose between 1-2 pounds per week by taking in fewer calories and burning off more calories You can succeed by limiting portions, limiting foods dense in calories and fat, becoming more active, and drinking 8 glasses of water a day (64 ounces) Don't skip meals, especially breakfast, as skipping meals may alter your metabolism Do not use over-the-counter weight loss pills or gimmicks that claim rapid weight loss A healthy BMI (or body mass index) is between 18.5 and 24.9 You can calculate your ideal BMI at the [place] website ClubMonetize.fr Try to lose 10 pounds before your next visit Try to follow the DASH guidelines (DASH stands for Dietary Approaches to Stop Hypertension). Try to limit the sodium in your diet to no more than 1,500mg  of sodium per day. Certainly try to not exceed 2,000 mg per day at the very most. Do not add salt when cooking or at the table.  Check the sodium amount on labels when shopping, and choose items lower in sodium when given a choice. Avoid or limit foods that already contain a lot of sodium. Eat a diet rich in fruits and vegetables and whole grains, and try to lose weight if overweight or obese  Preventing Unhealthy Weight Gain, Adult Staying at a healthy weight is important. When fat builds up in your body, you may become overweight or obese. These conditions put you at greater risk for developing certain health problems, such as heart disease, diabetes, sleeping problems, joint problems, and some cancers. Unhealthy weight gain is often the result of making unhealthy choices in what you eat. It is also a result of not getting  enough exercise. You can make changes to your lifestyle to prevent obesity and stay as healthy as possible. What nutrition changes can be made? To maintain a healthy weight and prevent obesity:  Eat only as much as your body needs. To do this: ? Pay attention to signs that you are hungry or full. Stop eating as soon as you feel full. ? If you feel hungry, try drinking water first. Drink enough water so your urine is clear or pale yellow. ? Eat smaller portions. ? Look at serving sizes on food labels. Most foods contain more than one serving per container. ? Eat the recommended amount of calories for your gender and activity level. While most active people should eat around 2,000 calories per day, if you are trying to lose weight or are not very active, you main need to eat less calories. Talk to your health care provider or dietitian about how many calories you should eat each day.  Choose healthy foods, such as: ? Fruits and vegetables. Try to fill at least half of your plate at each meal with fruits and vegetables. ? Whole grains, such as whole wheat bread, brown rice, and quinoa. ? Lean meats, such as chicken or fish. ? Other healthy proteins, such as beans, eggs, or tofu. ? Healthy fats, such as nuts, seeds, fatty fish, and olive oil. ? Low-fat or fat-free dairy.  Check food labels and avoid food and drinks that: ? Are high in calories. ? Have added sugar. ? Are high in sodium. ? Have saturated fats or trans fats.  Limit how much  you eat of the following foods: ? Prepackaged meals. ? Fast food. ? Fried foods. ? Processed meat, such as bacon, sausage, and deli meats. ? Fatty cuts of red meat and poultry with skin.  Cook foods in healthier ways, such as by baking, broiling, or grilling.  When grocery shopping, try to shop around the outside of the store. This helps you buy mostly fresh foods and avoid canned and prepackaged foods.  What lifestyle changes can be made?  Exercise  at least 30 minutes 5 or more days each week. Exercising includes brisk walking, yard work, biking, running, swimming, and team sports like basketball and soccer. Ask your health care provider which exercises are safe for you.  Do not use any products that contain nicotine or tobacco, such as cigarettes and e-cigarettes. If you need help quitting, ask your health care provider.  Limit alcohol intake to no more than 1 drink a day for nonpregnant women and 2 drinks a day for men. One drink equals 12 oz of beer, 5 oz of wine, or 1 oz of hard liquor.  Try to get 7-9 hours of sleep each night. What other changes can be made?  Keep a food and activity journal to keep track of: ? What you ate and how many calories you had. Remember to count sauces, dressings, and side dishes. ? Whether you were active, and what exercises you did. ? Your calorie, weight, and activity goals.  Check your weight regularly. Track any changes. If you notice you have gained weight, make changes to your diet or activity routine.  Avoid taking weight-loss medicines or supplements. Talk to your health care provider before starting any new medicine or supplement.  Talk to your health care provider before trying any new diet or exercise plan. Why are these changes important? Eating healthy, staying active, and having healthy habits not only help prevent obesity, they also:  Help you to manage stress and emotions.  Help you to connect with friends and family.  Improve your self-esteem.  Improve your sleep.  Prevent long-term health problems.  What can happen if changes are not made? Being obese or overweight can cause you to develop joint or bone problems, which can make it hard for you to stay active or do activities you enjoy. Being obese or overweight also puts stress on your heart and lungs and can lead to health problems like diabetes, heart disease, and some cancers. Where to find more information: Talk with  your health care provider or a dietitian about healthy eating and healthy lifestyle choices. You may also find other information through these resources:  U.S. Department of Agriculture MyPlate: FormerBoss.no  American Heart Association: www.heart.org  Centers for Disease Control and Prevention: http://www.wolf.info/  Summary  Staying at a healthy weight is important. It helps prevent certain diseases and health problems, such as heart disease, diabetes, joint problems, sleep disorders, and some cancers.  Being obese or overweight can cause you to develop joint or bone problems, which can make it hard for you to stay active or do activities you enjoy.  You can prevent unhealthy weight gain by eating a healthy diet, exercising regularly, not smoking, limiting alcohol, and getting enough sleep.  Talk with your health care provider or a dietitian for guidance about healthy eating and healthy lifestyle choices. This information is not intended to replace advice given to you by your health care provider. Make sure you discuss any questions you have with your health care provider. Document Released:  07/24/2016 Document Revised: 08/29/2016 Document Reviewed: 08/29/2016 Elsevier Interactive Patient Education  2018 [employer].  Obesity, Adult Obesity is the condition of having too much total body fat. Being overweight or obese means that your weight is greater than what is considered healthy for your body size. Obesity is determined by a measurement called BMI. BMI is an estimate of body fat and is calculated from height and weight. For adults, a BMI of 30 or higher is considered obese. Obesity can eventually lead to other health concerns and major illnesses, including:  Stroke.  Coronary artery disease (CAD).  Type 2 diabetes.  Some types of cancer, including cancers of the colon, breast, uterus, and gallbladder.  Osteoarthritis.  High blood pressure (hypertension).  High  cholesterol.  Sleep apnea.  Gallbladder stones.  Infertility problems.  What are the causes? The main cause of obesity is taking in (consuming) more calories than your body uses for energy. Other factors that contribute to this condition may include:  Being born with genes that make you more likely to become obese.  Having a medical condition that causes obesity. These conditions include: ? Hypothyroidism. ? Polycystic ovarian syndrome (PCOS). ? Binge-eating disorder. ? Cushing syndrome.  Taking certain medicines, such as steroids, antidepressants, and seizure medicines.  Not being physically active (sedentary lifestyle).  Living where there are limited places to exercise safely or buy healthy foods.  Not getting enough sleep.  What increases the risk? The following factors may increase your risk of this condition:  Having a family history of obesity.  Being a woman of African-American descent.  Being a man of Hispanic descent.  What are the signs or symptoms? Having excessive body fat is the main symptom of this condition. How is this diagnosed? This condition may be diagnosed based on:  Your symptoms.  Your medical history.  A physical exam. Your health care provider may measure: ? Your BMI. If you are an adult with a BMI between 25 and less than 30, you are considered overweight. If you are an adult with a BMI of 30 or higher, you are considered obese. ? The distances around your hips and your waist (circumferences). These may be compared to each other to help diagnose your condition. ? Your skinfold thickness. Your health care provider may gently pinch a fold of your skin and measure it.  How is this treated? Treatment for this condition often includes changing your lifestyle. Treatment may include some or all of the following:  Dietary changes. Work with your health care provider and a dietitian to set a weight-loss goal that is healthy and reasonable for  you. Dietary changes may include eating: ? Smaller portions. A portion size is the amount of a particular food that is healthy for you to eat at one time. This varies from person to person. ? Low-calorie or low-fat options. ? More whole grains, fruits, and vegetables.  Regular physical activity. This may include aerobic activity (cardio) and strength training.  Medicine to help you lose weight. Your health care provider may prescribe medicine if you are unable to lose 1 pound a week after 6 weeks of eating more healthily and doing more physical activity.  Surgery. Surgical options may include gastric banding and gastric bypass. Surgery may be done if: ? Other treatments have not helped to improve your condition. ? You have a BMI of 40 or higher. ? You have life-threatening health problems related to obesity.  Follow these instructions at home:  Eating and drinking  Follow recommendations from your health care provider about what you eat and drink. Your health care provider may advise you to: ? Limit fast foods, sweets, and processed snack foods. ? Choose low-fat options, such as low-fat milk instead of whole milk. ? Eat 5 or more servings of fruits or vegetables every day. ? Eat at home more often. This gives you more control over what you eat. ? Choose healthy foods when you eat out. ? Learn what a healthy portion size is. ? Keep low-fat snacks on hand. ? Avoid sugary drinks, such as soda, fruit juice, iced tea sweetened with sugar, and flavored milk. ? Eat a healthy breakfast.  Drink enough water to keep your urine clear or pale yellow.  Do not go without eating for long periods of time (do not fast) or follow a fad diet. Fasting and fad diets can be unhealthy and even dangerous. Physical Activity  Exercise regularly, as told by your health care provider. Ask your health care provider what types of exercise are safe for you and how often you should exercise.  Warm up and  stretch before being active.  Cool down and stretch after being active.  Rest between periods of activity. Lifestyle  Limit the time that you spend in front of your TV, computer, or video game system.  Find ways to reward yourself that do not involve food.  Limit alcohol intake to no more than 1 drink a day for nonpregnant women and 2 drinks a day for men. One drink equals 12 oz of beer, 5 oz of wine, or 1 oz of hard liquor. General instructions  Keep a weight loss journal to keep track of the food you eat and how much you exercise you get.  Take over-the-counter and prescription medicines only as told by your health care provider.  Take vitamins and supplements only as told by your health care provider.  Consider joining a support group. Your health care provider may be able to recommend a support group.  Keep all follow-up visits as told by your health care provider. This is important. Contact a health care provider if:  You are unable to meet your weight loss goal after 6 weeks of dietary and lifestyle changes. This information is not intended to replace advice given to you by your health care provider. Make sure you discuss any questions you have with your health care provider. Document Released: 08/30/2004 Document Revised: 12/26/2015 Document Reviewed: 05/11/2015 Elsevier Interactive Patient Education  2018 [employer].

## 2017-11-25 NOTE — Assessment & Plan Note (Signed)
Encourage BP control, weight loss, continue ARB; decrease red meat

## 2017-11-25 NOTE — Assessment & Plan Note (Signed)
Asymptomatic; taking aspirin

## 2017-11-25 NOTE — Assessment & Plan Note (Signed)
He is working on this

## 2017-11-25 NOTE — Assessment & Plan Note (Signed)
encouaged patient to lose 10 more pounds over the next 3 months; he would prefer to not add another pill; we'll give 3 months to lose weight and get BP or he agrees to add another pill; try the DASH guidelines

## 2017-11-25 NOTE — Assessment & Plan Note (Signed)
Check A1c today; foot exam by MD; eye exam UTD

## 2017-11-25 NOTE — Assessment & Plan Note (Signed)
Keep f/u with vascular; yearly CT, next due March 2020; goal LDL less than 70

## 2017-11-25 NOTE — Progress Notes (Signed)
BP 132/84   Pulse 88   Temp 98.3 F (36.8 C) (Oral)   Resp 14   Ht 6' (1.829 m)   Wt 273 lb 12.8 oz (124.2 kg)   SpO2 93%   BMI 37.13 kg/m    Subjective:    Patient ID: Edgar [name], male    DOB: 1946-04-06, 72 y.o.   MRN: [id number]  HPI: Edgar [name] is a 72 y.o. male  Chief Complaint  Patient presents with  . Follow-up  . Medication Refill    HPI Patient is here for f/u  Type 2 diabetes mellitus Last eye exam was 09/09/17, note on the chart Last A1c was high (9.7), so the jardiance was increased to 25 mg daily; we also increased his meal-time insulin from five units to seven units TID ac; however, he is actually giving himself eight units TID ac He did not bring his sugar readings with him Did not check the sugar this morning; he did not take any pills or other medicine Last sugar was 160 he thinks last night; checking sugars TID Highest sugar in the last 2 weeks: over 200 Lowest sugar in the last 2 weeks: 89 No new problems with feet  Blood pressure; not checking BP at home; not adding salt to food; wife does not cook with food; does eat fast food  High cholesterol; tries to walk, but HDL still low; does a little mowing at times Lab Results  Component Value Date   CHOL 131 08/26/2017   HDL 38 (L) 08/26/2017   LDLCALC 77 08/26/2017   TRIG 76 08/26/2017   CHOLHDL 3.4 08/26/2017   Used to have anemia; last CBC was all normal  Chest CT to look at aneurysm; CT scan 10/25/17 IMPRESSION: 1. Stable thoracic aortic aneurysm with 4.4 cm ascending and 4.4 cm descending components. Recommend annual imaging followup by CTA or MRA. This recommendation follows 2010 ACCF/AHA/AATS/ACR/ASA/SCA/SCAI/SIR/STS/SVM Guidelines for the Diagnosis and Management of Patients with Thoracic Aortic Disease. Circulation. 2010; 121: C166-A630 2. Atherosclerosis, including coronary artery disease. Please note that although the presence of coronary artery calcium documents the presence  of coronary artery disease, the severity of this disease and any potential stenosis cannot be assessed on this non-gated CT examination. Assessment for potential risk factor modification, dietary therapy or pharmacologic therapy may be warranted, if clinically indicated.   Electronically Signed   By: Lucrezia Europe M.D.   On: 10/25/2017 10:02  Obesity; trying to eat lighter; not a good water drinker; no sodas  High calcium; not taking tums; normal vit D and normal phos  He is having neck issues; large abscess; wife busted it and got stuff out of it; no fevers; comes up every now and then  Depression screen [facility] 2/9 11/25/2017 10/09/2017 08/26/2017 05/14/2017 04/30/2017  Decreased Interest 0 0 0 0 0  Down, Depressed, Hopeless 0 0 0 0 0  PHQ - 2 Score 0 0 0 0 0    Relevant past medical, surgical, family and social history reviewed Past Medical History:  Diagnosis Date  . Coronary artery disease 10/25/2017   CT scan March 2019  . Diabetes mellitus without complication ([place])   . Essential hypertension, benign 12/23/2015  . History of colonoscopy with polypectomy 12/07/2016   Polypectomy x 12 per patient  . Hx of tobacco use, presenting hazards to health 12/23/2015   Quit prior to 1997  . Hyperlipidemia 12/23/2015  . Hypertension   . 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  [place]) 05/14/2017  . Obesity 12/23/2015  . Pulmonary embolism ([place]) 05/18/2016   Hospitalization Sept 2017, following hip fracture   Past Surgical History:  Procedure Laterality Date  . COLONOSCOPY WITH PROPOFOL N/A 01/15/2017   Procedure: COLONOSCOPY WITH PROPOFOL;  Surgeon: Jonathon Bellows, MD;  Location[facility] ENDOSCOPY;  Service: Endoscopy;  Laterality: N/A;  . EYE SURGERY    . INTRAMEDULLARY (IM) NAIL INTERTROCHANTERIC Left 04/14/2016   Procedure: INTRAMEDULLARY (IM) NAIL INTERTROCHANTRIC;  Surgeon: Corky Mull, MD;  Location: [facility] ORS;  Service: Orthopedics;  Laterality: Left;  . JOINT REPLACEMENT Left 04/2016  .  TOTAL HIP ARTHROPLASTY     Family History  Problem Relation Age of Onset  . Cancer Mother   . Diabetes Mother   . Heart disease Father   . Alcohol abuse Brother    Social History   Tobacco Use  . Smoking status: Former Research scientist (life sciences)  . Smokeless tobacco: Never Used  Substance Use Topics  . Alcohol use: No    Alcohol/week: 0.0 oz  . Drug use: No    Interim medical history since last visit reviewed. Allergies and medications reviewed  Review of Systems  Constitutional: Negative for unexpected weight change.  Cardiovascular: Negative for chest pain.   Per HPI unless specifically indicated above     Objective:    BP 132/84   Pulse 88   Temp 98.3 F (36.8 C) (Oral)   Resp 14   Ht 6' (1.829 m)   Wt 273 lb 12.8 oz (124.2 kg)   SpO2 93%   BMI 37.13 kg/m   Wt Readings from Last 3 Encounters:  11/25/17 273 lb 12.8 oz (124.2 kg)  10/09/17 274 lb 6.4 oz (124.5 kg)  08/26/17 282 lb 9.6 oz (128.2 kg)    Physical Exam  Constitutional: He appears well-developed and well-nourished. No distress.  HENT:  Head: Normocephalic and atraumatic.  Eyes: EOM are normal. No scleral icterus.  Neck: No thyromegaly present.  Cardiovascular: Normal rate and regular rhythm.  Pulmonary/Chest: Effort normal and breath sounds normal.  Abdominal: Soft. Bowel sounds are normal. He exhibits no distension.  Musculoskeletal: He exhibits no edema.  Neurological: Coordination normal.  Skin: Skin is warm and dry. No pallor.     5 x 3 erythematous mass, central eschar c/w previous drainage; no active drainage today; significantly indurated; posterior neck just LEFT of midline  Psychiatric: He has a normal mood and affect. His behavior is normal. Judgment and thought content normal.   Diabetic Foot Form - Detailed   Diabetic Foot Exam - detailed Diabetic Foot exam was performed with the following findings:  Yes 11/25/2017  9:11 AM  Visual Foot Exam completed.:  Yes  Pulse Foot Exam completed.:  Yes    Right Dorsalis Pedis:  Present Left Dorsalis Pedis:  Present  Sensory Foot Exam Completed.:  Yes Semmes-Weinstein Monofilament Test R Site 1-Great Toe:  Pos L Site 1-Great Toe:  Pos        Results for orders placed or performed during the hospital encounter of 10/25/17  I-STAT creatinine  Result Value Ref Range   Creatinine, Ser 1.20 0.61 - 1.24 mg/dL      Assessment & Plan:   Problem List Items Addressed This Visit      Cardiovascular and Mediastinum   Essential hypertension, benign (Chronic)    encouaged patient to lose 10 more pounds over the next 3 months; he would prefer to not add another pill; we'll give 3 months to lose weight and get  BP or he agrees to add another pill; try the DASH guidelines      Coronary artery disease    Asymptomatic; taking aspirin      Aneurysm of aorta (HCC)    Keep f/u with vascular; yearly CT, next due March 2020; goal LDL less than 70        Digestive   Fatty liver    Working on weight loss, low fat diet        Endocrine   Type II diabetes mellitus, uncontrolled (HCC) - Primary (Chronic)    Check A1c today; foot exam by MD; eye exam UTD      Relevant Medications   Insulin Glargine (LANTUS SOLOSTAR) 100 UNIT/ML Solostar Pen   Other Relevant Orders   Hemoglobin O6V   Basic Metabolic Panel (BMET)   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HCC)    Encourage BP control, weight loss, continue ARB; decrease red meat      Relevant Medications   Insulin Glargine (LANTUS SOLOSTAR) 100 UNIT/ML Solostar Pen     Other   Morbid obesity ([place])    He is working on this      Relevant Medications   Insulin Glargine (LANTUS SOLOSTAR) 100 UNIT/ML Solostar Pen    Other Visit Diagnoses    Hypercalcemia       recheck calcium level today; last Vit D and phos normal   Relevant Orders   Basic Metabolic Panel (BMET)   Neck abscess       refer to gen surgeon for I&D; with his diabetes, will want this treated quickly; tell wife  "hands off"   Relevant Orders   Ambulatory referral to General Surgery       Follow up plan: Return in about 3 months (around 02/24/2018) for follow-up visit with Dr. Sanda Klein.  An after-visit summary was printed and given to the patient at [facility].  Please see the patient instructions which may contain other information and recommendations beyond what is mentioned above in the assessment and plan.  Meds ordered this encounter  Medications  . Insulin Glargine (LANTUS SOLOSTAR) 100 UNIT/ML Solostar Pen    Sig: Inject 60 Units into the skin daily. Or as directed by your doctor    Dispense:  18 mL    Refill:  5    Orders Placed This Encounter  Procedures  . Hemoglobin A1C  . Basic Metabolic Panel (BMET)  . Ambulatory referral to General Surgery

## 2017-11-26 LAB — BASIC METABOLIC PANEL
BUN: 13 mg/dL (ref 7–25)
CO2: 27 mmol/L (ref 20–32)
Calcium: 10 mg/dL (ref 8.6–10.3)
Chloride: 106 mmol/L (ref 98–110)
Creat: 0.99 mg/dL (ref 0.70–1.18)
Glucose, Bld: 108 mg/dL — ABNORMAL HIGH (ref 65–99)
Potassium: 4.5 mmol/L (ref 3.5–5.3)
Sodium: 138 mmol/L (ref 135–146)

## 2017-11-26 LAB — HEMOGLOBIN A1C
Hgb A1c MFr Bld: 6.8 % of total Hgb — ABNORMAL HIGH (ref <5.7)
Mean Plasma Glucose: 148 (calc)
eAG (mmol/L): 8.2 (calc)

## 2017-11-27 ENCOUNTER — Other Ambulatory Visit: Payer: Self-pay

## 2017-11-27 ENCOUNTER — Encounter: Payer: Self-pay | Admitting: Surgery

## 2017-11-27 ENCOUNTER — Ambulatory Visit: Payer: Medicare Other | Admitting: Surgery

## 2017-11-27 ENCOUNTER — Telehealth: Payer: Self-pay

## 2017-11-27 VITALS — BP 123/75 | HR 78 | Temp 97.9°F | Ht 72.0 in | Wt 279.6 lb

## 2017-11-27 DIAGNOSIS — L720 Epidermal cyst: Secondary | ICD-10-CM | POA: Diagnosis not present

## 2017-11-27 MED ORDER — CEPHALEXIN 500 MG PO CAPS: 500.0000 mg | ORAL_CAPSULE | Freq: Four times a day (QID) | ORAL | 0 refills | Status: DC

## 2017-11-27 NOTE — Telephone Encounter (Signed)
Spoke with Janett Billow at [facility] for specimen pick up.  Specimen packaged for transport and placed in white drop box and hung over door.

## 2017-11-27 NOTE — Progress Notes (Signed)
Surgical Clinic History and Physical  Referring provider:  Arnetha Courser, MD [address], [place] [address]  HISTORY OF PRESENT ILLNESS (HPI):  72 y.o. male presents for evaluation of a posterior neck lesion. Patient reports a focal swelling has been present for 1 - 2 weeks and has become increasingly red and painful. He describes his wife 1 year ago applied pressure and was able to express pus, after which it resolved. This time, however, he says it has worsened instead of improving despite her applying pressure similarly and expressing purulent fluid. He otherwise states he sometimes experiences intermittent chills, but he denies feeling febrile, N/V, CP, or SOB. He also adds that sometimes the lesion appears to enlarge, while it regresses at other times, at which times it does not bother him.  PAST MEDICAL HISTORY (PMH):  Past Medical History:  Diagnosis Date  . Aneurysm of aorta ([place]) 09/11/2016   Noted on CT scan Sept 2017  . Aneurysm of thoracic aorta ([place]) 09/24/2017  . Coronary artery disease 10/25/2017   CT scan March 2019  . Diabetes mellitus without complication ([place])   . Essential hypertension, benign 12/23/2015  . History of colonoscopy with polypectomy 12/07/2016   Polypectomy x 12 per patient  . Hx of tobacco use, presenting hazards to health 12/23/2015   Quit prior to 1997  . Hyperlipidemia 12/23/2015  . Hypertension   . 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[place]) 05/14/2017  . Obesity 12/23/2015  . Pulmonary embolism ([place]) 05/18/2016   Hospitalization Sept 2017, following hip fracture     PAST SURGICAL HISTORY [facility]):  Past Surgical History:  Procedure Laterality Date  . COLONOSCOPY WITH PROPOFOL N/A 01/15/2017   Procedure: COLONOSCOPY WITH PROPOFOL;  Surgeon: Jonathon Bellows, MD;  Location: [facility] ENDOSCOPY;  Service: Endoscopy;  Laterality: N/A;  . EYE SURGERY    . INTRAMEDULLARY (IM) NAIL INTERTROCHANTERIC Left 04/14/2016   Procedure:  INTRAMEDULLARY (IM) NAIL INTERTROCHANTRIC;  Surgeon: Corky Mull, MD;  Location: [facility] ORS;  Service: Orthopedics;  Laterality: Left;  . JOINT REPLACEMENT Left 04/2016  . TOTAL HIP ARTHROPLASTY       MEDICATIONS:  Prior to Admission medications   Medication Sig Start Date End Date Taking? Authorizing Provider  acetaminophen (TYLENOL) 500 MG tablet Take 500 mg by mouth every 6 (six) hours as needed.   Yes [provider]  amLODipine (NORVASC) 10 MG tablet Take 1 tablet (10 mg total) by mouth daily. 04/30/17  Yes Lada, Satira Anis, MD  aspirin EC 81 MG tablet Take 81 mg by mouth daily.   Yes [provider]  atorvastatin (LIPITOR) 40 MG tablet TAKE 1 TABLET BY MOUTH AT BEDTIME 09/24/17  Yes Lada, Satira Anis, MD  Blood Glucose Monitoring Suppl (ONE TOUCH ULTRA 2) w/Device KIT Check fingerstick blood sugars three times a day; DX E11.65, LON 99 months 07/04/17  Yes Lada, Satira Anis, MD  cholecalciferol (VITAMIN D) 1000 units tablet Take 1,000 Units by mouth 2 (two) times daily.    Yes [provider]  empagliflozin (JARDIANCE) 25 MG TABS tablet Take 25 mg by mouth daily. (higher dose) 08/27/17  Yes Lada, Satira Anis, MD  Fish Oil-Cholecalciferol (FISH OIL + D3) 1200-1000 MG-UNIT CAPS Take 1,200 mg by mouth daily.   Yes [provider]  fluticasone (FLONASE) 50 MCG/ACT nasal spray Place 2 sprays into both nostrils daily. Patient taking differently: Place 2 sprays into both nostrils daily as needed.  04/30/17  Yes Lada, Satira Anis, MD  glucose blood (ONE TOUCH ULTRA TEST) test strip Use as instructedDx E11.65, LON 99 months; check fingerstick blood sugars three times a day 07/04/17  Yes Lada, Satira Anis, MD  insulin aspart (NOVOLOG FLEXPEN) 100 UNIT/ML FlexPen Novolog Flexpen U-100 Insulin aspart 100 unit/mL (3 mL) subcutaneous   Yes [provider]  Insulin Glargine (LANTUS SOLOSTAR) 100 UNIT/ML Solostar Pen Inject 60 Units into the skin daily. Or as directed by your  doctor 11/25/17  Yes Lada, Satira Anis, MD  insulin lispro (HUMALOG KWIKPEN) 100 UNIT/ML KiwkPen Inject 8 Units into the skin 3 (three) times daily with meals. SHORT-acting insulin 09/06/17  Yes Lada, Satira Anis, MD  Insulin Pen Needle (B-D UF III MINI PEN NEEDLES) 31G X 5 MM MISC Use once a day with long-acting insulin, and up to three times a day with short-acting insulin; 4x a day total 07/22/17  Yes Lada, Satira Anis, MD  insulin regular (HUMULIN R) 100 units/mL injection Inject into the skin. 01/10/16  Yes [provider]  Lancets (ONETOUCH ULTRASOFT) lancets Check fingerstick blood sugar three times a day; E11.65, LON 99 months 07/04/17  Yes Lada, Satira Anis, MD  losartan (COZAAR) 100 MG tablet TAKE 1 TABLET (100 MG TOTAL) BY MOUTH DAILY. 07/04/17  Yes Lada, Satira Anis, MD  cephALEXin (KEFLEX) 500 MG capsule Take 1 capsule (500 mg total) by mouth 4 (four) times daily. 11/27/17   Vickie Epley, MD     ALLERGIES:  No Known Allergies   SOCIAL HISTORY:  Social History   Socioeconomic History  . Marital status: Married    Spouse name: Not on file  . Number of children: Not on file  . Years of education: Not on file  . Highest education level: Not on file  Occupational History  . Not on file  Social Needs  . Financial resource strain: Not on file  . Food insecurity:    Worry: Not on file    Inability: Not on file  . Transportation needs:    Medical: Not on file    Non-medical: Not on file  Tobacco Use  . Smoking status: Former Research scientist (life sciences)  . Smokeless tobacco: Never Used  Substance and Sexual Activity  . Alcohol use: No    Alcohol/week: 0.0 oz  . Drug use: No  . Sexual activity: Not Currently  Lifestyle  . Physical activity:    Days per week: Not on file    Minutes per session: Not on file  . Stress: Not on file  Relationships  . Social connections:    Talks on phone: Not on file    Gets together: Not on file    Attends religious service: Not on file    Active member of  club or organization: Not on file    Attends meetings of clubs or organizations: Not on file    Relationship status: Not on file  . Intimate partner violence:    Fear of current or ex partner: Not on file    Emotionally abused: Not on file    Physically abused: Not on file    Forced sexual activity: Not on file  Other Topics Concern  . Not on file  Social History Narrative  . Not on file    The patient currently resides (home / rehab facility / nursing home): Home The patient normally is (ambulatory / bedbound): Ambulatory  FAMILY HISTORY:  Family History  Problem Relation Age of Onset  . Cancer Mother   . Diabetes Mother   .  Heart disease Father   . Alcohol abuse Brother     Otherwise negative/non-contributory.  REVIEW OF SYSTEMS:  Constitutional: denies any other weight loss, fever, chills, or sweats  Eyes: denies any other vision changes, history of eye injury  ENT: denies sore throat, hearing problems  Respiratory: denies shortness of breath, wheezing  Cardiovascular: denies chest pain, palpitations  Gastrointestinal: denies abdominal pain, N/V, or diarrhea Musculoskeletal: denies any other joint pains or cramps  Skin: Denies any other rashes or skin discolorations except as per HPI Neurological: denies any other headache, dizziness, weakness  Psychiatric: Denies any other depression, anxiety   All other review of systems were otherwise negative   VITAL SIGNS:  BP 123/75   Pulse 78   Temp 97.9 F (36.6 C) (Oral)   Ht 6' (1.829 m)   Wt 279 lb 9.6 oz (126.8 kg)   BMI 37.92 kg/m   PHYSICAL EXAM:  Constitutional:  -- Obese body habitus  -- Awake, alert, and oriented x3  Eyes:  -- Pupils equally round and reactive to light  -- No scleral icterus  Ear, nose, throat:  -- No jugular venous distension -- No nasal drainage, bleeding Pulmonary:  -- No crackles  -- Equal breath sounds bilaterally -- Breathing non-labored at rest Cardiovascular:  -- S1, S2  present  -- No pericardial rubs  Gastrointestinal:  -- Abdomen soft, nontender, non-distended, no guarding/rebound  -- No abdominal masses appreciated, pulsatile or otherwise  Musculoskeletal and Integumentary:  -- Wounds or skin discoloration: moderately tender ~2 cm x 2 cm area of focal posterior neck swelling with mixed fluctuance and additional surrounding induration with moderate erythema, small amount of pus able to be expressed with the application of direct focal pressure -- Extremities: B/L UE and LE FROM, hands and feet warm, no edema  Neurologic:  -- Motor function: Intact and symmetric -- Sensation: Intact and symmetric  Labs:  CBC:  Lab Results  Component Value Date   WBC 6.8 08/26/2017   RBC 5.18 08/26/2017   BMP:  Lab Results  Component Value Date   GLUCOSE 108 (H) 11/25/2017   CO2 27 11/25/2017   BUN 13 11/25/2017   BUN 16 01/06/2016   CREATININE 0.99 11/25/2017   CALCIUM 10.0 11/25/2017     Imaging studies: No pertinent imaging available for review   Assessment/Plan:  72 y.o. male with likely an infected posterior neck sebaceous cyst, complicated by co-morbidities including morbid obesity (BMI 38), DM, HTN, HLD, CAD, TAAA, former tobacco abuse (smoking), and a history of PE following a hip fracture.   - all risks, benefits, and alternatives to incision and drainage of posterior neck abscess were discussed with the patient, all of his questions were answered to his expressed satisfaction, patient expresses he wishes to proceed, and informed consent was obtained.  - will proceed with incision and drainage of posterior neck abscess, likely an infected sebaceous cyst  - complete course of post-drainage oral antibiotics (keflex 500 mg BID x 7 days)  - return to clinic in 1 week, instructed to call if any questions or concerns  - consider excision of neck cyst upon resolution of infection  - follow-up abscess cultures  All of the above recommendations were  discussed with the patient, and all of patient's questions were answered to his expressed satisfaction.  Thank you for the opportunity to participate in this patient's care.  -- Marilynne Drivers Rosana Hoes, MD, [place]: [facility] - Partnering for exceptional care. Office:  [phone]

## 2017-11-27 NOTE — Patient Instructions (Signed)
WE will see you back 12/04/17 at 8:00 am.  Please pick up your medicine at the pharmacy and start it today. Please keep a dressing over the area until the drainage stops completely.

## 2017-11-27 NOTE — Procedures (Signed)
SURGICAL OPERATIVE REPORT  DATE OF PROCEDURE: 11/27/2017  ATTENDING: Corene Cornea E. Rosana Hoes, MD  ANESTHESIA: Local  PRE-OPERATIVE DIAGNOSIS: Posterior neck abscess (icd-10's: L02.11, L72.3)  POST-OPERATIVE DIAGNOSIS: Posterior neck abscess (icd-10's: L02.11, L72.3)  PROCEDURE(S):  1.) Incision and drainage of posterior neck abscess, consistent with infected posterior neck sebaceous cyst/abscess (cpt: 10060)  INTRAOPERATIVE FINDINGS: ~30 mL of brownish/greenish purulent fluid from posterior neck abscess in addition to caseous material obtained consistent with contents of an infected sebaceous cyst  INTRAVENOUS FLUIDS: 0 mL crystalloid   ESTIMATED BLOOD LOSS: Minimal (<20 mL)   URINE OUTPUT: No Foley catheter   SPECIMENS: Contents of posterior neck abscess for culture  IMPLANTS: None  DRAINS: None  COMPLICATIONS: None apparent  CONDITION AT END OF PROCEDURE: Hemodynamically stable and awake  DISPOSITION OF PATIENT: PACU  INDICATIONS FOR PROCEDURE:  Patient is a 72 y.o. male who presented to [facility] outpatient surgical office with posterior neck pain and erythema/swelling x 1 - 2 weeks, for which patient was not started on oral antibiotics. All risks, benefits, and alternatives to incision and drainage of posterior neck abscess, likely infected sebaceous cyst, were discussed with the patient, all of patient's questions were answered to his expressed satisfaction, and informed consent was obtained and documented.  DETAILS OF PROCEDURE: Patient was brought to the procedure room and appropriately identified. In seated position, operative site was prepped and draped in the usual sterile fashion, and following a brief time out, the focus of maximal fluctuance was identified and a 1.5 cm incision was made using a #11 blade scalpel, upon which a combination of mixed caseous material (consistent with sebaceous cyst) and  brown/green-purulent fluid was immediately released. Additional direct focal pressure was applied, expressing additional purulent fluid from below the level of the initial drainage incision. Accordingly, a second incision was made, converting the initially transverse incision into a cruciate incision and expressing additional purulent fluid from below the level of the first drainage incision. Deep abscess cultures were then obtained, and additional fluid was expressed. intra-abscess septations/loculations were then disrupted using a hemostat. A dry gauze was inserted and removed, after which sterile gauze was advanced into the former abscess/cyst cavity to promote drainage and to prevent closure of the skin over the drained abscess cavity. Surrounding skin was then cleaned and dried, and a sterile dry gauze and adhesive dressing was applied. Wound care instructions were then provided.  I was present for all aspects of the above procedure, and no operative complications were apparent.

## 2017-11-29 ENCOUNTER — Ambulatory Visit: Payer: Medicare Other | Admitting: Surgery

## 2017-12-02 LAB — ANAEROBIC AND AEROBIC CULTURE

## 2017-12-05 ENCOUNTER — Ambulatory Visit (INDEPENDENT_AMBULATORY_CARE_PROVIDER_SITE_OTHER): Payer: Medicare Other | Admitting: Surgery

## 2017-12-05 ENCOUNTER — Encounter: Payer: Self-pay | Admitting: Surgery

## 2017-12-05 VITALS — BP 127/75 | HR 73 | Temp 97.8°F | Wt 276.0 lb

## 2017-12-05 DIAGNOSIS — L723 Sebaceous cyst: Secondary | ICD-10-CM

## 2017-12-05 NOTE — Patient Instructions (Addendum)
We will see you back in 3 weeks to excise it.

## 2017-12-05 NOTE — Progress Notes (Signed)
Surgical Clinic Progress/Follow-up Note   HPI:  72 y.o. Edgar [name] presents to clinic for follow-up evaluation of recently infected posterior neck sebaceous cyst. Patient reports he finished his prescribed antibiotics and describes complete resolution of neck pain, says he's no longer aware anything is there, but he is very anxious about similar recurring and requests to have the likely sebaceous cyst removed to reduce his risk of similar experiences. He otherwise denies any posterior neck pain, fever/chills, N/V, CP, or SOB.  Review of Systems:  Constitutional: denies any other weight loss, fever, chills, or sweats  Eyes: denies any other vision changes, history of eye injury  ENT: denies sore throat, hearing problems  Respiratory: denies shortness of breath, wheezing  Cardiovascular: denies chest pain, palpitations  Gastrointestinal: denies abdominal pain, N/V, or diarrhea Musculoskeletal: denies any other joint pains or cramps  Skin: Denies any other rashes or skin discolorations except as described with interval history Neurological: denies any other headache, dizziness, weakness  Psychiatric: denies any other depression, anxiety  All other review of systems: otherwise negative   Vital Signs:  BP 127/75   Pulse 73   Temp 97.8 F (36.6 C) (Oral)   Wt 276 lb (125.2 kg)   BMI 37.43 kg/m    Physical Exam:  Constitutional:  -- Obese body habitus  -- Awake, alert, and oriented x3  Eyes:  -- Pupils equally round and reactive to light  -- No scleral icterus  Ear, nose, throat:  -- No jugular venous distension  -- No nasal drainage, bleeding Pulmonary:  -- No crackles -- Equal breath sounds bilaterally -- Breathing non-labored at rest Cardiovascular:  -- S1, S2 present  -- No pericardial rubs  Gastrointestinal:  -- Soft, nontender, non-distended, no guarding/rebound  -- No abdominal masses appreciated, pulsatile or otherwise  Musculoskeletal / Integumentary:  -- Wounds or  skin discoloration: Focal 2 - 3 mm residual posterior neck wound with pink healthy granulation tissue without surrounding erythema and scant fibrinous exudate easily wiped away, completely non-tender  -- Extremities: B/L UE and LE FROM, hands and feet warm, no edema  Neurologic:  -- Motor function: intact and symmetric  -- Sensation: intact and symmetric   Assessment:  72 y.o. yo Edgar [name] with a problem list including...  Patient Active Problem List   Diagnosis Date Noted  . Coronary artery disease 10/25/2017  . 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[place]) 05/14/2017  . BRBPR (bright red blood per rectum) 12/07/2016  . History of colonoscopy with polypectomy 12/07/2016  . Medication monitoring encounter 12/07/2016  . Fatty liver 09/11/2016  . Aneurysm of aorta ([place]) 09/11/2016  . 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 Tinea pedis 06/25/2016  . Closed intertrochanteric fracture of left hip, sequela 05/30/2016  . Hx of pulmonary embolus 05/18/2016  . Closed left hip fracture ([place]) 04/13/2016  . Essential hypertension, benign 12/23/2015  . Hyperlipidemia 12/23/2015  . Hx of tobacco use, presenting hazards to health 12/23/2015  . Morbid obesity ([place]) 12/23/2015  . Type II diabetes mellitus, uncontrolled ([place]) 01/25/2015    presents to clinic for follow-up evaluation, doing well s/p incision and drainage of posterior neck abscess consistent with infected sebaceous cyst.  Plan:   - continue daily dry gauze to affected site prn drainage   - all risks, benefits, and alternatives to excision of posterior neck subcutaneous mass were discussed with the patient, all of his questions were answered to his expressed satisfaction, patient expresses he wishes to proceed,  and informed consent was obtained.  - will plan for in-office excision of posterior neck mass, most likely sebaceous cyst, with local anesthesia, tentatively on Wednesday, May 29th  -  anticipate return to clinic 2 weeks following above procedure  - instructed to call office if any questions or concerns  All of the above recommendations were discussed with the patient, and all of patient's questions were answered to his expressed satisfaction.  -- Edgar Edgar [name] Edgar Hoes, MD, [place]: [facility] - Partnering for exceptional care. Office: [phone]

## 2017-12-20 ENCOUNTER — Other Ambulatory Visit: Payer: Self-pay | Admitting: Family Medicine

## 2017-12-31 ENCOUNTER — Encounter: Payer: Self-pay | Admitting: Surgery

## 2017-12-31 ENCOUNTER — Other Ambulatory Visit: Payer: Self-pay | Admitting: Surgery

## 2017-12-31 ENCOUNTER — Ambulatory Visit: Payer: Medicare Other | Admitting: Surgery

## 2017-12-31 VITALS — BP 114/74 | HR 82 | Temp 97.3°F | Ht 72.0 in | Wt 274.6 lb

## 2017-12-31 DIAGNOSIS — L723 Sebaceous cyst: Secondary | ICD-10-CM | POA: Diagnosis not present

## 2017-12-31 DIAGNOSIS — D170 Benign lipomatous neoplasm of skin and subcutaneous tissue of head, face and neck: Secondary | ICD-10-CM

## 2017-12-31 NOTE — Patient Instructions (Signed)
Please do not remove your dressing and/or take a shower for 48 hours.  We will see you back in 2 weeks.

## 2017-12-31 NOTE — Procedures (Signed)
SURGICAL OPERATIVE REPORT  DATE OF PROCEDURE: 12/31/2017  ATTENDING: Corene Cornea E. Rosana Hoes, MD  ANESTHESIA: Local  PRE-OPERATIVE DIAGNOSIS: Symptomatic (painful, intermittently draining, and recently infected) sebaceous cyst of the midline posterior neck s/p incision and drainage procedure (icd-10: L72.3)   POST-OPERATIVE DIAGNOSIS: Symptomatic (painful, intermittently draining, and recently infected) sebaceous cyst of the midline posterior neck s/p incision and drainage procedure (icd-10: L72.3)   PROCEDURE(S):  1.) Excision of symptomatic (painful, intermittently draining, and recently infected) 3.5 cm x 2.5 cm x 2.5 cm sebaceous cyst of the midline posterior neck s/p incision and drainage procedure (cpt: 40973)   INTRAOPERATIVE FINDINGS: 3.5 cm x 2.5 cm x 2.5 cm previously infected subcutaneous mass of the posterior neck (most likely a sebaceous cyst) s/p previous incision and drainage, removed intact  INTRAVENOUS FLUIDS: 0 mL crystalloid   ESTIMATED BLOOD LOSS: Minimal (< 20 mL)  URINE OUTPUT: No Foley  SPECIMENS: 3.5 cm x 2.5 cm x 2.5 cm no longer infected subcutaneous mass of the midline posterior neck (most likely a sebaceous cyst), removed intact  IMPLANTS: None  DRAINS: None  COMPLICATIONS: None apparent  CONDITION AT END OF PROCEDURE: Hemodynamically stable and awake  DISPOSITION OF PATIENT: Discharged to home following brief period of in-office monitoring  INDICATIONS FOR PROCEDURE:  Patient is a 72 y.o. male who presented for a painful, intermittently draining, and recently infected sebaceous cyst of the midline posterior neck, essentially at the scalp/hairline, s/p recent incision and drainage procedure. Patient reports it's been present/growing for years, but has not previously been excised, periodically grows in size, drains, and then regresses, and recently has become increasingly painful due in part to its location. Patient requested that it be removed so activities  can be performed with less discomfort, and patient was accordingly referred for surgical evaluation and management. All risks, benefits, and alternatives to above procedure were discussed with the patient, all of patient's questions were answered to his expressed satisfaction, and informed consent was obtained and documented.   DETAILS OF PROCEDURE: Patient was brought to the procedure room and was appropriately identified. Laying on the procedural table in prone position with his head turned towards his Left, operative site was prepped and draped in the usual sterile fashion, and following a brief time out, a 4 cm wide and 2 cm tall transverse elliptical incision was made using a #15 blade scalpel, and incision was extended deep around subcutaneous mass to fascia using sharp + blunt dissection with the application of direct pressure for hemostasis. During the course of dissecting free the cyst, it was not disrupted and was removed intact. Direct pressure was applied for hemostasis. Further examination did not reveal any additional cyst. Hemostasis was once more confirmed, and the wound was irrigated with unused local anesthetic. Deep tissue and dermis were re-approximated using buried interrupted 3-0 Vicryl suture, and 4-0 Monocryl suture was used to re-approximate epidermis. Skin was then cleaned and dried, and sterile Dermabond skin glue was applied to the wound.  I was present for all aspects of the above procedure, and no operative complications were apparent.

## 2018-01-01 ENCOUNTER — Ambulatory Visit: Payer: Self-pay | Admitting: Surgery

## 2018-01-07 ENCOUNTER — Other Ambulatory Visit: Payer: Self-pay | Admitting: Family Medicine

## 2018-01-07 NOTE — Telephone Encounter (Signed)
11/25/17 Cr and K+ reviewed, Rx approved

## 2018-01-08 LAB — PATHOLOGY

## 2018-01-16 ENCOUNTER — Encounter: Payer: Self-pay | Admitting: Surgery

## 2018-01-16 ENCOUNTER — Ambulatory Visit (INDEPENDENT_AMBULATORY_CARE_PROVIDER_SITE_OTHER): Payer: Medicare Other | Admitting: Surgery

## 2018-01-16 VITALS — BP 145/72 | HR 69 | Temp 98.3°F | Ht 72.0 in | Wt 275.4 lb

## 2018-01-16 DIAGNOSIS — L723 Sebaceous cyst: Secondary | ICD-10-CM

## 2018-01-16 DIAGNOSIS — Z4889 Encounter for other specified surgical aftercare: Secondary | ICD-10-CM

## 2018-01-16 NOTE — Patient Instructions (Signed)
Please call our office if you have questions or concerns.   

## 2018-01-16 NOTE — Progress Notes (Signed)
Surgical Clinic Progress/Follow-up Note   HPI:  72 y.o. Male presents to clinic for post-op follow-up 2 weeks s/p excision of increasingly symptomatic and previously infected posterior neck sebaceous cyst Rosana Hoes, 12/31/2017). Patient reports he feels "much better" with resolution of pre-operative pain and has been tolerating regular diet with +flatus and normal BM's, denies N/V, fever/chills, CP, or SOB.  Review of Systems:  Constitutional: denies fever/chills  Respiratory: denies shortness of breath, wheezing  Cardiovascular: denies chest pain, palpitations  Skin: Denies any other rashes or skin discolorations except post-surgical wounds as per interval history  Vital Signs:  BP (!) 145/72   Pulse 69   Temp 98.3 F (36.8 C) (Oral)   Ht 6' (1.829 m)   Wt 275 lb 6.4 oz (124.9 kg)   BMI 37.35 kg/m    Physical Exam:  Constitutional:  -- Obese body habitus  -- Awake, alert, and oriented x3  Pulmonary:  -- No crackles -- Equal breath sounds bilaterally -- Breathing non-labored at rest Cardiovascular:  -- S1, S2 present  -- No pericardial rubs  Musculoskeletal / Integumentary:  -- Wounds or skin discoloration: Post-surgical posterior neck well-approximated and non-tender without any surrounding erythema or drainage -- Extremities: B/L UE and LE FROM, hands and feet warm   Assessment:  72 y.o. yo Male with a problem list including...  Patient Active Problem List   Diagnosis Date Noted  . Coronary artery disease 10/25/2017  . 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[place]) 05/14/2017  . BRBPR (bright red blood per rectum) 12/07/2016  . History of colonoscopy with polypectomy 12/07/2016  . Medication monitoring encounter 12/07/2016  . Fatty liver 09/11/2016  . Aneurysm of aorta ([place]) 09/11/2016  . 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 Tinea pedis 06/25/2016  . Closed intertrochanteric fracture of left hip, sequela 05/30/2016   . Hx of pulmonary embolus 05/18/2016  . Closed left hip fracture ([place]) 04/13/2016  . Essential hypertension, benign 12/23/2015  . Hyperlipidemia 12/23/2015  . Hx of tobacco use, presenting hazards to health 12/23/2015  . Morbid obesity ([place]) 12/23/2015  . Type II diabetes mellitus, uncontrolled ([place]) 01/25/2015    presents to clinic for post-op follow-up evaluation, doing well 2 weeks s/p excision of increasingly symptomatic and previously infected posterior neck sebaceous cyst Rosana Hoes, 12/31/2017).  Plan:              - okay to submerge incisions under water (baths, swimming) prn             - gradually resume all activities without restrictions over next 2 weeks  - no need for dressing, okay to get haircut, but avoid trauma to post-surgical wound             - apply sunblock particularly to incisions with sun exposure to reduce pigmentation of scars             - return to clinic as needed, instructed to call office if any questions or concerns  All of the above recommendations were discussed with the patient, and all of patient's questions were answered to his expressed satisfaction.  -- Marilynne Drivers Rosana Hoes, MD, [place]: [facility] - Partnering for exceptional care. Office: [phone]

## 2018-02-03 ENCOUNTER — Other Ambulatory Visit: Payer: Self-pay | Admitting: Family Medicine

## 2018-02-03 NOTE — Telephone Encounter (Signed)
Atorvastatin requested; denied He should not need a refill yet I prescribed a 6 month supply on 09/24/17 Please resolve with pharmacy

## 2018-02-18 ENCOUNTER — Other Ambulatory Visit: Payer: Self-pay | Admitting: Family Medicine

## 2018-02-18 NOTE — Telephone Encounter (Signed)
Rx request for amlodipine 10 mg He should not be out I prescribed a year's worth on 04/30/17

## 2018-02-22 ENCOUNTER — Other Ambulatory Visit: Payer: Self-pay | Admitting: Family Medicine

## 2018-02-22 NOTE — Telephone Encounter (Signed)
Reviewed last Cr and A1c; one year approved

## 2018-02-26 ENCOUNTER — Ambulatory Visit: Payer: Medicare Other | Admitting: Family Medicine

## 2018-02-26 ENCOUNTER — Encounter: Payer: Self-pay | Admitting: Family Medicine

## 2018-02-26 VITALS — BP 132/84 | HR 84 | Temp 97.3°F | Resp 14 | Ht 72.0 in | Wt 279.0 lb

## 2018-02-26 DIAGNOSIS — Z5181 Encounter for therapeutic drug level monitoring: Secondary | ICD-10-CM

## 2018-02-26 DIAGNOSIS — I2510 Atherosclerotic heart disease of native coronary artery without angina pectoris: Secondary | ICD-10-CM | POA: Diagnosis not present

## 2018-02-26 DIAGNOSIS — E782 Mixed hyperlipidemia: Secondary | ICD-10-CM

## 2018-02-26 DIAGNOSIS — E1165 Type 2 diabetes mellitus with hyperglycemia: Secondary | ICD-10-CM

## 2018-02-26 DIAGNOSIS — I10 Essential (primary) hypertension: Secondary | ICD-10-CM

## 2018-02-26 DIAGNOSIS — E1129 Type 2 diabetes mellitus with other diabetic kidney complication: Secondary | ICD-10-CM

## 2018-02-26 DIAGNOSIS — R809 Proteinuria, unspecified: Secondary | ICD-10-CM

## 2018-02-26 DIAGNOSIS — I712 Thoracic aortic aneurysm, without rupture, unspecified: Secondary | ICD-10-CM

## 2018-02-26 NOTE — Assessment & Plan Note (Signed)
Check urine today 

## 2018-02-26 NOTE — Progress Notes (Signed)
BP 132/84   Pulse 84   Temp (!) 97.3 F (36.3 C) (Oral)   Resp 14   Ht 6' (1.829 m)   Wt 279 lb (126.6 kg)   SpO2 96%   BMI 37.84 kg/m    Subjective:    Patient ID: Edgar [name], male    DOB: 04-10-1946, 72 y.o.   MRN: [id number]  HPI: Edgar [name] is a 72 y.o. male  Chief Complaint  Patient presents with  . Follow-up    HPI Patient is here for follow-up  Type 2 diabetes Patient is taking Jardiance 25mg  a day and takes 60 units of lantus at night and 8 units of insulin lispro three times a day before eating- states usually eats breakfast and then a snack- like cereal around lunch time and evening- sandwich. Drinks 3-4 bottles of water at the house. Occasionally drinks soda or coffee but not regularly.  Patient chest blood sugars at 8:30 am 12:30pm 4:30pm and 8:30pm.  Lowest in the last month 94 Highest in the last month 200 Takes lisinopril for nephroprotection.  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 States sometimes he walk around but states has not been doing it lately.  High cholesterol Eats some vegetables- beets, cabbage, states likes pinto beans; eats them maybe two times a week. Avoids fried foods- eats baked or boiled foods but rarely will eat at [place]. Patient takes atorvastatin 40mg  at bedtime and taking fish oils takes it every day with no missed doses.  Lab Results  Component Value Date   CHOL 131 08/26/2017   HDL 38 (L) 08/26/2017   LDLCALC 77 08/26/2017   TRIG 76 08/26/2017   CHOLHDL 3.4 08/26/2017   Hypertension amlodipine 10mg  a day, losartan 100mg  a day; states did not take medicine this morning because he was fasting. (he was told he should go ahead and take his medicines for BP even if fasting)  BP Readings from Last 3 Encounters:  02/26/18 132/84  01/16/18 (!) 145/72  12/31/17 114/74   Aortic aneurysm,  thoracic and abdominal; seeing vascular  Depression screen [facility] 2/9 02/26/2018 11/25/2017 10/09/2017 08/26/2017 05/14/2017  Decreased Interest 0 0 0 0 0  Down, Depressed, Hopeless 0 0 0 0 0  PHQ - 2 Score 0 0 0 0 0    Relevant past medical, surgical, family and social history reviewed Past Medical History:  Diagnosis Date  . Aneurysm of aorta ([place]) 09/11/2016   Noted on CT scan Sept 2017  . 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 Coronary artery disease 10/25/2017   CT scan March 2019  . Diabetes mellitus without complication ([place])   . Essential hypertension, benign 12/23/2015  . History of colonoscopy with polypectomy 12/07/2016   Polypectomy x 12 per patient  . Hx of tobacco use, presenting hazards to health 12/23/2015   Quit prior to 1997  . Hyperlipidemia 12/23/2015  . Hypertension   . 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 Obesity 12/23/2015  . Pulmonary embolism ([place]) 05/18/2016   Hospitalization Sept 2017, following hip fracture   Past Surgical History:  Procedure Laterality Date  . COLONOSCOPY WITH PROPOFOL N/A 01/15/2017   Procedure: COLONOSCOPY WITH PROPOFOL;  Surgeon: Jonathon Bellows, MD;  Location: [facility] ENDOSCOPY;  Service: Endoscopy;  Laterality: N/A;  . EYE SURGERY    .  INTRAMEDULLARY (IM) NAIL INTERTROCHANTERIC Left 04/14/2016   Procedure: INTRAMEDULLARY (IM) NAIL INTERTROCHANTRIC;  Surgeon: Corky Mull, MD;  Location: [facility] ORS;  Service: Orthopedics;  Laterality: Left;  . JOINT REPLACEMENT Left 04/2016  . TOTAL HIP ARTHROPLASTY     Family History  Problem Relation Age of Onset  . Cancer Mother   . Diabetes Mother   . Heart disease Father   . Alcohol abuse Brother    Social History   Tobacco Use  . Smoking status: Former Research scientist (life sciences)  . Smokeless tobacco: Never Used  Substance Use Topics  . Alcohol use: No    Alcohol/week: 0.0 oz  . Drug use: No    Interim medical history since last visit reviewed. Allergies and medications  reviewed  Review of Systems  Constitutional: Negative for unexpected weight change.  Cardiovascular: Negative for chest pain.  Gastrointestinal: Negative for blood in stool.  Genitourinary: Negative for hematuria.   Per HPI unless specifically indicated above     Objective:    BP 132/84   Pulse 84   Temp (!) 97.3 F (36.3 C) (Oral)   Resp 14   Ht 6' (1.829 m)   Wt 279 lb (126.6 kg)   SpO2 96%   BMI 37.84 kg/m   Wt Readings from Last 3 Encounters:  02/26/18 279 lb (126.6 kg)  01/16/18 275 lb 6.4 oz (124.9 kg)  12/31/17 274 lb 9.6 oz (124.6 kg)    Physical Exam  Constitutional: He appears well-developed and well-nourished. No distress.  HENT:  Head: Normocephalic and atraumatic.  Eyes: EOM are normal. No scleral icterus.  Neck: No thyromegaly present.  Cardiovascular: Normal rate and regular rhythm.  Pulmonary/Chest: Effort normal and breath sounds normal.  Abdominal: Soft. Bowel sounds are normal. He exhibits no distension.  Musculoskeletal: He exhibits no edema.  Neurological: Coordination normal.  Skin: Skin is warm and dry. No pallor.  Psychiatric: He has a normal mood and affect. His behavior is normal. Judgment and thought content normal.   Diabetic Foot Form - Detailed   Diabetic Foot Exam - detailed Diabetic Foot exam was performed with the following findings:  Yes 02/26/2018 10:03 AM  Visual Foot Exam completed.:  Yes  Pulse Foot Exam completed.:  Yes  Right Dorsalis Pedis:  Present Left Dorsalis Pedis:  Present  Sensory Foot Exam Completed.:  Yes Semmes-Weinstein Monofilament Test R Site 1-Great Toe:  Pos L Site 1-Great Toe:  Pos        Results for orders placed or performed in visit on 12/31/17  Pathology  Result Value Ref Range   . Comment    . Comment    . Comment    . Comment    . Comment    . Comment    . Comment    . Comment       Assessment & Plan:   Problem List Items Addressed This Visit      Cardiovascular and Mediastinum    Essential hypertension, benign (Chronic)    Patient did not take BP medicine today; encouraged him to take blood pressure medicine before appointments even if fasting; try to cut down on salt      Coronary artery disease    Work on weight loss, control diabetes, blood pressure      Relevant Orders   Lipid panel   Aneurysm of thoracic aorta ([place])    Work on getting LDL under 70      Relevant Orders   Lipid panel  Endocrine   Type II diabetes mellitus, uncontrolled ([place]) - Primary (Chronic)    Working on better diet; eye exam UTD: foot exam by MD today; check labs      Relevant Orders   Hemoglobin A1C   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[place])    Check urine today      Relevant Orders   Microalbumin / creatinine urine ratio     Other   Medication monitoring encounter   Relevant Orders   COMPLETE METABOLIC PANEL WITH GFR   Morbid obesity ([place])    Encouraged patient to lose 10 pounds over the next 3 months; see AVS; encouraged a little more walking, cutting back on fried foods      Hyperlipidemia    Check lipids today; goal LDL less than 70      Relevant Orders   Lipid panel       Follow up plan: Return in about 3 months (around 05/29/2018).  An after-visit summary was printed and given to the patient at [facility].  Please see the patient instructions which may contain other information and recommendations beyond what is mentioned above in the assessment and plan.  No orders of the defined types were placed in this encounter.   Orders Placed This Encounter  Procedures  . Hemoglobin A1C  . Microalbumin / creatinine urine ratio  . Lipid panel  . COMPLETE METABOLIC PANEL WITH GFR

## 2018-02-26 NOTE — Assessment & Plan Note (Signed)
Encouraged patient to lose 10 pounds over the next 3 months; see AVS; encouraged a little more walking, cutting back on fried foods

## 2018-02-26 NOTE — Assessment & Plan Note (Signed)
Check lipids today; goal LDL less than 70 

## 2018-02-26 NOTE — Assessment & Plan Note (Signed)
Work on weight loss, control diabetes, blood pressure

## 2018-02-26 NOTE — Assessment & Plan Note (Signed)
Patient did not take BP medicine today; encouraged him to take blood pressure medicine before appointments even if fasting; try to cut down on salt

## 2018-02-26 NOTE — Assessment & Plan Note (Signed)
Work on getting LDL under 70

## 2018-02-26 NOTE — Assessment & Plan Note (Signed)
Working on better diet; eye exam UTD: foot exam by MD today; check labs

## 2018-02-26 NOTE — Patient Instructions (Addendum)
Check out the information at familydoctor.org entitled "Nutrition for Weight Loss: What You Need to Know about Fad Diets" Try to lose between 1-2 pounds per week by taking in fewer calories and burning off more calories You can succeed by limiting portions, limiting foods dense in calories and fat, becoming more active, and drinking 8 glasses of water a day (64 ounces) Don't skip meals, especially breakfast, as skipping meals may alter your metabolism Do not use over-the-counter weight loss pills or gimmicks that claim rapid weight loss A healthy BMI (or body mass index) is between 18.5 and 24.9 You can calculate your ideal BMI at the [place] website ClubMonetize.fr   Obesity, Adult Obesity is the condition of having too much total body fat. Being overweight or obese means that your weight is greater than what is considered healthy for your body size. Obesity is determined by a measurement called BMI. BMI is an estimate of body fat and is calculated from height and weight. For adults, a BMI of 30 or higher is considered obese. Obesity can eventually lead to other health concerns and major illnesses, including:  Stroke.  Coronary artery disease (CAD).  Type 2 diabetes.  Some types of cancer, including cancers of the colon, breast, uterus, and gallbladder.  Osteoarthritis.  High blood pressure (hypertension).  High cholesterol.  Sleep apnea.  Gallbladder stones.  Infertility problems.  What are the causes? The main cause of obesity is taking in (consuming) more calories than your body uses for energy. Other factors that contribute to this condition may include:  Being born with genes that make you more likely to become obese.  Having a medical condition that causes obesity. These conditions include: ? Hypothyroidism. ? Polycystic ovarian syndrome (PCOS). ? Binge-eating disorder. ? Cushing syndrome.  Taking certain medicines,  such as steroids, antidepressants, and seizure medicines.  Not being physically active (sedentary lifestyle).  Living where there are limited places to exercise safely or buy healthy foods.  Not getting enough sleep.  What increases the risk? The following factors may increase your risk of this condition:  Having a family history of obesity.  Being a woman of African-American descent.  Being a man of Hispanic descent.  What are the signs or symptoms? Having excessive body fat is the main symptom of this condition. How is this diagnosed? This condition may be diagnosed based on:  Your symptoms.  Your medical history.  A physical exam. Your health care provider may measure: ? Your BMI. If you are an adult with a BMI between 25 and less than 30, you are considered overweight. If you are an adult with a BMI of 30 or higher, you are considered obese. ? The distances around your hips and your waist (circumferences). These may be compared to each other to help diagnose your condition. ? Your skinfold thickness. Your health care provider may gently pinch a fold of your skin and measure it.  How is this treated? Treatment for this condition often includes changing your lifestyle. Treatment may include some or all of the following:  Dietary changes. Work with your health care provider and a dietitian to set a weight-loss goal that is healthy and reasonable for you. Dietary changes may include eating: ? Smaller portions. A portion size is the amount of a particular food that is healthy for you to eat at one time. This varies from person to person. ? Low-calorie or low-fat options. ? More whole grains, fruits, and vegetables.  Regular physical activity. This may  include aerobic activity (cardio) and strength training.  Medicine to help you lose weight. Your health care provider may prescribe medicine if you are unable to lose 1 pound a week after 6 weeks of eating more healthily and  doing more physical activity.  Surgery. Surgical options may include gastric banding and gastric bypass. Surgery may be done if: ? Other treatments have not helped to improve your condition. ? You have a BMI of 40 or higher. ? You have life-threatening health problems related to obesity.  Follow these instructions at home:  Eating and drinking   Follow recommendations from your health care provider about what you eat and drink. Your health care provider may advise you to: ? Limit fast foods, sweets, and processed snack foods. ? Choose low-fat options, such as low-fat milk instead of whole milk. ? Eat 5 or more servings of fruits or vegetables every day. ? Eat at home more often. This gives you more control over what you eat. ? Choose healthy foods when you eat out. ? Learn what a healthy portion size is. ? Keep low-fat snacks on hand. ? Avoid sugary drinks, such as soda, fruit juice, iced tea sweetened with sugar, and flavored milk. ? Eat a healthy breakfast.  Drink enough water to keep your urine clear or pale yellow.  Do not go without eating for long periods of time (do not fast) or follow a fad diet. Fasting and fad diets can be unhealthy and even dangerous. Physical Activity  Exercise regularly, as told by your health care provider. Ask your health care provider what types of exercise are safe for you and how often you should exercise.  Warm up and stretch before being active.  Cool down and stretch after being active.  Rest between periods of activity. Lifestyle  Limit the time that you spend in front of your TV, computer, or video game system.  Find ways to reward yourself that do not involve food.  Limit alcohol intake to no more than 1 drink a day for nonpregnant women and 2 drinks a day for men. One drink equals 12 oz of beer, 5 oz of wine, or 1 oz of hard liquor. General instructions  Keep a weight loss journal to keep track of the food you eat and how much you  exercise you get.  Take over-the-counter and prescription medicines only as told by your health care provider.  Take vitamins and supplements only as told by your health care provider.  Consider joining a support group. Your health care provider may be able to recommend a support group.  Keep all follow-up visits as told by your health care provider. This is important. Contact a health care provider if:  You are unable to meet your weight loss goal after 6 weeks of dietary and lifestyle changes. This information is not intended to replace advice given to you by your health care provider. Make sure you discuss any questions you have with your health care provider. Document Released: 08/30/2004 Document Revised: 12/26/2015 Document Reviewed: 05/11/2015 Elsevier Interactive Patient Education  2018 [place].  Preventing Unhealthy [employer], Adult Staying at a healthy weight is important. When fat builds up in your body, you may become overweight or obese. These conditions put you at greater risk for developing certain health problems, such as heart disease, diabetes, sleeping problems, joint problems, and some cancers. Unhealthy weight gain is often the result of making unhealthy choices in what you eat. It is also a result of  not getting enough exercise. You can make changes to your lifestyle to prevent obesity and stay as healthy as possible. What nutrition changes can be made? To maintain a healthy weight and prevent obesity:  Eat only as much as your body needs. To do this: ? Pay attention to signs that you are hungry or full. Stop eating as soon as you feel full. ? If you feel hungry, try drinking water first. Drink enough water so your urine is clear or pale yellow. ? Eat smaller portions. ? Look at serving sizes on food labels. Most foods contain more than one serving per container. ? Eat the recommended amount of calories for your gender and activity level. While most active  people should eat around 2,000 calories per day, if you are trying to lose weight or are not very active, you main need to eat less calories. Talk to your health care provider or dietitian about how many calories you should eat each day.  Choose healthy foods, such as: ? Fruits and vegetables. Try to fill at least half of your plate at each meal with fruits and vegetables. ? Whole grains, such as whole wheat bread, brown rice, and quinoa. ? Lean meats, such as chicken or fish. ? Other healthy proteins, such as beans, eggs, or tofu. ? Healthy fats, such as nuts, seeds, fatty fish, and olive oil. ? Low-fat or fat-free dairy.  Check food labels and avoid food and drinks that: ? Are high in calories. ? Have added sugar. ? Are high in sodium. ? Have saturated fats or trans fats.  Limit how much you eat of the following foods: ? Prepackaged meals. ? Fast food. ? Fried foods. ? Processed meat, such as bacon, sausage, and deli meats. ? Fatty cuts of red meat and poultry with skin.  Cook foods in healthier ways, such as by baking, broiling, or grilling.  When grocery shopping, try to shop around the outside of the store. This helps you buy mostly fresh foods and avoid canned and prepackaged foods.  What lifestyle changes can be made?  Exercise at least 30 minutes 5 or more days each week. Exercising includes brisk walking, yard work, biking, running, swimming, and team sports like basketball and soccer. Ask your health care provider which exercises are safe for you.  Do not use any products that contain nicotine or tobacco, such as cigarettes and e-cigarettes. If you need help quitting, ask your health care provider.  Limit alcohol intake to no more than 1 drink a day for nonpregnant women and 2 drinks a day for men. One drink equals 12 oz of beer, 5 oz of wine, or 1 oz of hard liquor.  Try to get 7-9 hours of sleep each night. What other changes can be made?  Keep a food and activity  journal to keep track of: ? What you ate and how many calories you had. Remember to count sauces, dressings, and side dishes. ? Whether you were active, and what exercises you did. ? Your calorie, weight, and activity goals.  Check your weight regularly. Track any changes. If you notice you have gained weight, make changes to your diet or activity routine.  Avoid taking weight-loss medicines or supplements. Talk to your health care provider before starting any new medicine or supplement.  Talk to your health care provider before trying any new diet or exercise plan. Why are these changes important? Eating healthy, staying active, and having healthy habits not only help prevent obesity, they  also:  Help you to manage stress and emotions.  Help you to connect with friends and family.  Improve your self-esteem.  Improve your sleep.  Prevent long-term health problems.  What can happen if changes are not made? Being obese or overweight can cause you to develop joint or bone problems, which can make it hard for you to stay active or do activities you enjoy. Being obese or overweight also puts stress on your heart and lungs and can lead to health problems like diabetes, heart disease, and some cancers. Where to find more information: Talk with your health care provider or a dietitian about healthy eating and healthy lifestyle choices. You may also find other information through these resources:  U.S. Department of Agriculture MyPlate: FormerBoss.no  American Heart Association: www.heart.org  Centers for Disease Control and Prevention: http://www.wolf.info/  Summary  Staying at a healthy weight is important. It helps prevent certain diseases and health problems, such as heart disease, diabetes, joint problems, sleep disorders, and some cancers.  Being obese or overweight can cause you to develop joint or bone problems, which can make it hard for you to stay active or do activities you  enjoy.  You can prevent unhealthy weight gain by eating a healthy diet, exercising regularly, not smoking, limiting alcohol, and getting enough sleep.  Talk with your health care provider or a dietitian for guidance about healthy eating and healthy lifestyle choices. This information is not intended to replace advice given to you by your health care provider. Make sure you discuss any questions you have with your health care provider. Document Released: 07/24/2016 Document Revised: 08/29/2016 Document Reviewed: 08/29/2016 Elsevier Interactive Patient Education  2018 [place] Maintenance  Topic Date Due  . INFLUENZA VACCINE  03/06/2018  . HEMOGLOBIN A1C  05/27/2018  . OPHTHALMOLOGY EXAM  09/09/2018  . FOOT EXAM  11/26/2018  . TETANUS/TDAP  05/14/2019  . COLONOSCOPY  01/16/2020  . Hepatitis C Screening  Completed  . PNA vac Low Risk Adult  Completed   Try to lose 10 pounds before next visit

## 2018-02-27 LAB — COMPLETE METABOLIC PANEL WITH GFR
AG Ratio: 1.3 (calc) (ref 1.0–2.5)
ALT: 14 U/L (ref 9–46)
AST: 17 U/L (ref 10–35)
Albumin: 4.2 g/dL (ref 3.6–5.1)
Alkaline phosphatase (APISO): 74 U/L (ref 40–115)
BUN: 11 mg/dL (ref 7–25)
CO2: 28 mmol/L (ref 20–32)
Calcium: 10.2 mg/dL (ref 8.6–10.3)
Chloride: 105 mmol/L (ref 98–110)
Creat: 1.01 mg/dL (ref 0.70–1.18)
GFR, Est African American: 86 mL/min/{1.73_m2} (ref >=60)
GFR, Est Non African American: 74 mL/min/{1.73_m2} (ref >=60)
Globulin: 3.2 g/dL (calc) (ref 1.9–3.7)
Glucose, Bld: 111 mg/dL — ABNORMAL HIGH (ref 65–99)
Potassium: 4.4 mmol/L (ref 3.5–5.3)
Sodium: 138 mmol/L (ref 135–146)
Total Bilirubin: 0.7 mg/dL (ref 0.2–1.2)
Total Protein: 7.4 g/dL (ref 6.1–8.1)

## 2018-02-27 LAB — LIPID PANEL
Cholesterol: 112 mg/dL (ref <200)
HDL: 37 mg/dL — ABNORMAL LOW (ref >40)
LDL Cholesterol (Calc): 62 mg/dL (calc)
Non-HDL Cholesterol (Calc): 75 mg/dL (calc) (ref <130)
Total CHOL/HDL Ratio: 3 (calc) (ref <5.0)
Triglycerides: 54 mg/dL (ref <150)

## 2018-02-27 LAB — MICROALBUMIN / CREATININE URINE RATIO
Creatinine, Urine: 142 mg/dL (ref 20–320)
Microalb Creat Ratio: 92 mcg/mg creat — ABNORMAL HIGH (ref <30)
Microalb, Ur: 13 mg/dL

## 2018-02-27 LAB — HEMOGLOBIN A1C
Hgb A1c MFr Bld: 6.8 % of total Hgb — ABNORMAL HIGH (ref <5.7)
Mean Plasma Glucose: 148 (calc)
eAG (mmol/L): 8.2 (calc)

## 2018-03-04 ENCOUNTER — Other Ambulatory Visit: Payer: Self-pay | Admitting: Family Medicine

## 2018-03-04 NOTE — Telephone Encounter (Signed)
Last lipids and sgpt reviewed; Rx approved for one year

## 2018-05-07 ENCOUNTER — Other Ambulatory Visit: Payer: Self-pay | Admitting: Family Medicine

## 2018-05-15 ENCOUNTER — Other Ambulatory Visit: Payer: Self-pay | Admitting: Family Medicine

## 2018-05-15 ENCOUNTER — Other Ambulatory Visit: Payer: Self-pay | Admitting: Surgery

## 2018-05-25 ENCOUNTER — Other Ambulatory Visit: Payer: Self-pay | Admitting: Family Medicine

## 2018-05-26 NOTE — Telephone Encounter (Signed)
Left detailed VM asking patient to call back and verify what insulin he is using.

## 2018-05-26 NOTE — Telephone Encounter (Signed)
Please clarify insulins with patient  He has too many listed

## 2018-05-26 NOTE — Telephone Encounter (Signed)
Insulin Glargine (LANTUS SOLOSTAR) 100 UNIT/ML Solostar Pen      insulin lispro (HUMALOG KWIKPEN) 100 UNIT/ML KiwkPen

## 2018-05-27 MED ORDER — GLUCOSE BLOOD VI STRP: ORAL_STRIP | 12 refills | Status: DC

## 2018-05-27 NOTE — Addendum Note (Signed)
Addended by: Nethra Mehlberg, Satira Anis on: 05/27/2018 04:06 PM   Modules accepted: Orders

## 2018-05-27 NOTE — Telephone Encounter (Signed)
He currently has four insulins in his med list Please write back and let me know when this is corrected

## 2018-05-27 NOTE — Telephone Encounter (Signed)
Thank you :)

## 2018-05-27 NOTE — Telephone Encounter (Signed)
updated

## 2018-05-29 ENCOUNTER — Other Ambulatory Visit: Payer: Self-pay

## 2018-05-29 ENCOUNTER — Ambulatory Visit: Payer: Medicare Other | Admitting: Family Medicine

## 2018-05-29 ENCOUNTER — Encounter: Payer: Self-pay | Admitting: Family Medicine

## 2018-05-29 VITALS — BP 128/76 | HR 71 | Temp 97.9°F | Ht 72.0 in | Wt 272.3 lb

## 2018-05-29 DIAGNOSIS — E782 Mixed hyperlipidemia: Secondary | ICD-10-CM | POA: Diagnosis not present

## 2018-05-29 DIAGNOSIS — E1129 Type 2 diabetes mellitus with other diabetic kidney complication: Secondary | ICD-10-CM

## 2018-05-29 DIAGNOSIS — Z23 Encounter for immunization: Secondary | ICD-10-CM | POA: Diagnosis not present

## 2018-05-29 DIAGNOSIS — Z794 Long term (current) use of insulin: Secondary | ICD-10-CM

## 2018-05-29 DIAGNOSIS — E1121 Type 2 diabetes mellitus with diabetic nephropathy: Secondary | ICD-10-CM

## 2018-05-29 DIAGNOSIS — R809 Proteinuria, unspecified: Secondary | ICD-10-CM

## 2018-05-29 MED ORDER — ATORVASTATIN CALCIUM 40 MG PO TABS: 40.0000 mg | ORAL_TABLET | Freq: Every day | ORAL | 3 refills | Status: DC

## 2018-05-29 MED ORDER — INSULIN PEN NEEDLE 31G X 5 MM MISC: 3 refills | Status: DC

## 2018-05-29 NOTE — Progress Notes (Signed)
BP 128/76   Pulse 71   Temp 97.9 F (36.6 C)   Ht 6' (1.829 m)   Wt 272 lb 4.8 oz (123.5 kg)   SpO2 96%   BMI 36.93 kg/m    Subjective:    Patient ID: Edgar [name], male    DOB: Jul 01, 1946, 72 y.o.   MRN: [id number]  HPI: Edgar [name] is a 72 y.o. male  Chief Complaint  Patient presents with  . Follow-up    HPI Patient is here for f/u No medical excitement since last visit  Type 2 diabetes; no new problems with the feet Checking sugars BID; in the last week, highest sugar was almost 200, lowest sugar around 70 something; he knows what to do when it gets low; keep candy with him (peppermints); on insuin and using every day; he feels good overall Urine microalbumin:Cr had improved from 149 to 92 last check Last A1c was 6.8, controlled in July  High cholesterol; he is not on his statin; just didn't know to get it filled apparently; no side effects; last LDL was under 70 (62)  HTN; on medicine, chronic condition; he has a BP cuff once a week or so; BP usually "stays down", not going up he says; he was not able to give me numbers; no headaches  Obesity; he has lost 7 pounds since last visit; avoiding sweets  Depression screen [facility] 2/9 05/29/2018 02/26/2018 11/25/2017 10/09/2017 08/26/2017  Decreased Interest 0 0 0 0 0  Down, Depressed, Hopeless 0 0 0 0 0  PHQ - 2 Score 0 0 0 0 0  Altered sleeping 0 - - - -  Tired, decreased energy 0 - - - -  Change in appetite 0 - - - -  Feeling bad or failure about yourself  0 - - - -  Trouble concentrating 0 - - - -  Moving slowly or fidgety/restless 0 - - - -  Suicidal thoughts 0 - - - -  PHQ-9 Score 0 - - - -  Difficult doing work/chores Not difficult at all - - - -   Fall Risk  05/29/2018 02/26/2018 11/25/2017 10/09/2017 08/26/2017  Falls in the past year? No No No No No  Number falls in past yr: - - - - -  Injury with Fall? - - - - -    Relevant past medical, surgical, family and social history reviewed Past Medical History:    Diagnosis Date  . 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 Coronary artery disease 10/25/2017   CT scan March 2019  . Diabetes mellitus without complication ([place])   . Essential hypertension, benign 12/23/2015  . History of colonoscopy with polypectomy 12/07/2016   Polypectomy x 12 per patient  . Hx of tobacco use, presenting hazards to health 12/23/2015   Quit prior to 1997  . Hyperlipidemia 12/23/2015  . Hypertension   . 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 Obesity 12/23/2015  . Pulmonary embolism ([place]) 05/18/2016   Hospitalization Sept 2017, following hip fracture   Past Surgical History:  Procedure Laterality Date  . COLONOSCOPY WITH PROPOFOL N/A 01/15/2017   Procedure: COLONOSCOPY WITH PROPOFOL;  Surgeon: Jonathon Bellows, MD;  Location[facility] ENDOSCOPY;  Service: Endoscopy;  Laterality: N/A;  . EYE SURGERY    . INTRAMEDULLARY (IM) NAIL INTERTROCHANTERIC Left 04/14/2016   Procedure: INTRAMEDULLARY (IM) NAIL INTERTROCHANTRIC;  Surgeon: Jenny Reichmann  Robby Sermon, MD;  Location: [facility] ORS;  Service: Orthopedics;  Laterality: Left;  . JOINT REPLACEMENT Left 04/2016  . TOTAL HIP ARTHROPLASTY     Family History  Problem Relation Age of Onset  . Cancer Mother   . Diabetes Mother   . Heart disease Father   . Alcohol abuse Brother    Social History   Tobacco Use  . Smoking status: Former Research scientist (life sciences)  . Smokeless tobacco: Never Used  Substance Use Topics  . Alcohol use: No    Alcohol/week: 0.0 standard drinks  . Drug use: No     [facility] Visit from 05/29/2018 in [facility]  AUDIT-C Score  0      Interim medical history since last visit reviewed. Allergies and medications reviewed  Review of Systems  Constitutional: Negative for unexpected weight change.  Gastrointestinal: Negative for blood in stool.       Occ blood once in a blue moon when he strains, nothing recently   Per HPI  unless specifically indicated above     Objective:    BP 128/76   Pulse 71   Temp 97.9 F (36.6 C)   Ht 6' (1.829 m)   Wt 272 lb 4.8 oz (123.5 kg)   SpO2 96%   BMI 36.93 kg/m   Wt Readings from Last 3 Encounters:  05/29/18 272 lb 4.8 oz (123.5 kg)  02/26/18 279 lb (126.6 kg)  01/16/18 275 lb 6.4 oz (124.9 kg)    Physical Exam  Constitutional: He appears well-developed and well-nourished. No distress.  HENT:  Head: Normocephalic and atraumatic.  Eyes: EOM are normal. No scleral icterus.  Neck: No thyromegaly present.  Cardiovascular: Normal rate and regular rhythm.  Pulmonary/Chest: Effort normal and breath sounds normal.  Abdominal: Soft. Bowel sounds are normal. He exhibits no distension.  Musculoskeletal: He exhibits no edema.  Neurological: Coordination normal.  Skin: Skin is warm and dry. No pallor.  Psychiatric: He has a normal mood and affect. His behavior is normal. Judgment and thought content normal.   Diabetic Foot Form - Detailed   Diabetic Foot Exam - detailed Diabetic Foot exam was performed with the following findings:  Yes 05/29/2018  1:24 PM  Visual Foot Exam completed.:  Yes  Pulse Foot Exam completed.:  Yes  Right Dorsalis Pedis:  Present Left Dorsalis Pedis:  Present  Sensory Foot Exam Completed.:  Yes Semmes-Weinstein Monofilament Test R Site 1-Great Toe:  Pos L Site 1-Great Toe:  Pos          Assessment & Plan:   Problem List Items Addressed This Visit      Endocrine   Type 2 diabetes mellitus with diabetic nephropathy, 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HCC) - Primary    Check A1c every 6 months; foot exam by MD; eye exams regularly; he is working on weigh tloss      Relevant Medications   atorvastatin (LIPITOR) 40 MG tablet   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[place])    Aim for better glucose control, weight loss      Relevant Medications   atorvastatin (LIPITOR) 40 MG tablet     Other   Morbid obesity ([place])     He is working on weight loss; encouragement given; if he can lose substantial weight, we can start to decrease or even remove some medicines; BMI >35 with hyperlipidemia, HTN, diabetes      Hyperlipidemia    Encouraged ongoing weight loss, healthy eating, diet low  in sat fats; start back on statin      Relevant Medications   atorvastatin (LIPITOR) 40 MG tablet    Other Visit Diagnoses    Need for influenza vaccination       Relevant Orders   Flu vaccine HIGH DOSE PF (Fluzone High dose) (Completed)       Follow up plan: Return in about 3 months (around 08/29/2018) for twenty minute follow-up with fasting labs (or just AFTER).  An after-visit summary was printed and given to the patient at [place].  Please see the patient instructions which may contain other information and recommendations beyond what is mentioned above in the assessment and plan.  Meds ordered this encounter  Medications  . atorvastatin (LIPITOR) 40 MG tablet    Sig: Take 1 tablet (40 mg total) by mouth at bedtime.    Dispense:  90 tablet    Refill:  3  . Insulin Pen Needle (B-D UF III MINI PEN NEEDLES) 31G X 5 MM MISC    Sig: USE ONCE A DAY WITH LONG-ACTING INSULIN, AND UP TO 3X A DAY WITH SHORT-ACTING INSULIN    Dispense:  300 each    Refill:  3    Orders Placed This Encounter  Procedures  . Flu vaccine HIGH DOSE PF (Fluzone High dose)

## 2018-05-29 NOTE — Patient Instructions (Signed)
Keep up the good work with your weight loss Try to limit saturated fats in your diet (bologna, hot dogs, barbeque, cheeseburgers, hamburgers, steak, bacon, sausage, cheese, etc.) and get more fresh fruits, vegetables, and whole grains Check out the information at familydoctor.org entitled "Nutrition for Weight Loss: What You Need to Know about Fad Diets" Try to lose between 1-2 pounds per week by taking in fewer calories and burning off more calories You can succeed by limiting portions, limiting foods dense in calories and fat, becoming more active, and drinking 8 glasses of water a day (64 ounces) Don't skip meals, especially breakfast, as skipping meals may alter your metabolism Do not use over-the-counter weight loss pills or gimmicks that claim rapid weight loss A healthy BMI (or body mass index) is between 18.5 and 24.9 You can calculate your ideal BMI at the [place] website ClubMonetize.fr

## 2018-06-08 NOTE — Assessment & Plan Note (Signed)
He is working on weight loss; encouragement given; if he can lose substantial weight, we can start to decrease or even remove some medicines; BMI >35 with hyperlipidemia, HTN, diabetes

## 2018-06-08 NOTE — Assessment & Plan Note (Addendum)
Encouraged ongoing weight loss, healthy eating, diet low in sat fats; start back on statin

## 2018-06-08 NOTE — Assessment & Plan Note (Signed)
Check A1c every 6 months; foot exam by MD; eye exams regularly; he is working on weigh tloss

## 2018-06-08 NOTE — Assessment & Plan Note (Signed)
Aim for better glucose control, weight loss

## 2018-07-11 ENCOUNTER — Other Ambulatory Visit: Payer: Self-pay | Admitting: Family Medicine

## 2018-07-11 NOTE — Telephone Encounter (Signed)
Lab Results  Component Value Date   CREATININE 1.01 02/26/2018

## 2018-07-11 NOTE — Telephone Encounter (Signed)
Erroneous Entry  

## 2018-07-24 ENCOUNTER — Other Ambulatory Visit: Payer: Self-pay | Admitting: Family Medicine

## 2018-08-11 ENCOUNTER — Other Ambulatory Visit (INDEPENDENT_AMBULATORY_CARE_PROVIDER_SITE_OTHER): Payer: Self-pay | Admitting: Vascular Surgery

## 2018-08-11 DIAGNOSIS — I712 Thoracic aortic aneurysm, without rupture, unspecified: Secondary | ICD-10-CM

## 2018-09-01 ENCOUNTER — Encounter: Payer: Self-pay | Admitting: Family Medicine

## 2018-09-01 ENCOUNTER — Ambulatory Visit: Payer: Medicare Other | Admitting: Family Medicine

## 2018-09-01 VITALS — BP 116/72 | HR 76 | Temp 97.8°F | Resp 16 | Ht 72.0 in | Wt 281.8 lb

## 2018-09-01 DIAGNOSIS — I712 Thoracic aortic aneurysm, without rupture, unspecified: Secondary | ICD-10-CM

## 2018-09-01 DIAGNOSIS — I10 Essential (primary) hypertension: Secondary | ICD-10-CM

## 2018-09-01 DIAGNOSIS — I2510 Atherosclerotic heart disease of native coronary artery without angina pectoris: Secondary | ICD-10-CM

## 2018-09-01 DIAGNOSIS — R809 Proteinuria, unspecified: Secondary | ICD-10-CM

## 2018-09-01 DIAGNOSIS — E782 Mixed hyperlipidemia: Secondary | ICD-10-CM

## 2018-09-01 DIAGNOSIS — E1121 Type 2 diabetes mellitus with diabetic nephropathy: Secondary | ICD-10-CM | POA: Diagnosis not present

## 2018-09-01 DIAGNOSIS — E1129 Type 2 diabetes mellitus with other diabetic kidney complication: Secondary | ICD-10-CM

## 2018-09-01 DIAGNOSIS — Z5181 Encounter for therapeutic drug level monitoring: Secondary | ICD-10-CM

## 2018-09-01 DIAGNOSIS — Z794 Long term (current) use of insulin: Secondary | ICD-10-CM

## 2018-09-01 NOTE — Assessment & Plan Note (Signed)
Seeing vascular doctor 

## 2018-09-01 NOTE — Assessment & Plan Note (Signed)
Follow up with vascular doctor

## 2018-09-01 NOTE — Progress Notes (Signed)
BP 116/72 (BP Location: Right Arm, Patient Position: Sitting, Cuff Size: Large)   Pulse 76   Temp 97.8 F (36.6 C) (Oral)   Resp 16   Ht 6' (1.829 m)   Wt 281 lb 12.8 oz (127.8 kg)   SpO2 99%   BMI 38.22 kg/m    Subjective:    Patient ID: Edgar [name], male    DOB: 09-04-1945, 73 y.o.   MRN: [id number]  HPI: Edgar [name] is a 73 y.o. male  Chief Complaint  Patient presents with  . Follow-up    HPI Type 2 diabetes mellitus; checking FSBS 3x a day; all in January have been under 200; no low sugars He brought in his FSBS log and I reviewed it He is trying to eating better, trying to avoid certain foods; occasional Sprite every now and then Urine microalb:Cr staying elevated  Lab Results  Component Value Date   HGBA1C 6.8 (H) 02/26/2018   Morbid obesity; gained 9 pounds over the holidays; he thought about getting some oatmeal  HTN; checks BP at home sometimes  Thoracic aortic aneurysm; seeing vascular doctor; orders placed Aug 11, 2018 by vascular provider; he used to work for [employer] in [place] and understands analogies of pipes  CAD; taking aspirin; no chest pain  Hyperlipidemia; trying to watch his diet; turnip greens, salmon (little bit a week); not much fried foods, "every now and then I'll steal away and go to [place] or [place]"   Depression screen [facility] 2/9 09/01/2018 05/29/2018 02/26/2018 11/25/2017 10/09/2017  Decreased Interest 0 0 0 0 0  Down, Depressed, Hopeless 0 0 0 0 0  PHQ - 2 Score 0 0 0 0 0  Altered sleeping 0 0 - - -  Tired, decreased energy 0 0 - - -  Change in appetite 0 0 - - -  Feeling bad or failure about yourself  0 0 - - -  Trouble concentrating 0 0 - - -  Moving slowly or fidgety/restless 0 0 - - -  Suicidal thoughts 0 0 - - -  PHQ-9 Score 0 0 - - -  Difficult doing work/chores Not difficult at all Not difficult at all - - -   Fall Risk  09/01/2018 05/29/2018 02/26/2018 11/25/2017 10/09/2017  Falls in the past  year? 0 No No No No  Number falls in past yr: 0 - - - -  Injury with Fall? 0 - - - -    Relevant past medical, surgical, family and social history reviewed Past Medical History:  Diagnosis Date  . Aneurysm of aorta ([place]) 09/11/2016   Noted on CT scan Sept 2017  . Aneurysm of thoracic aorta ([place]) 09/24/2017  . Coronary artery disease 10/25/2017   CT scan March 2019  . Diabetes mellitus without complication ([place])   . Essential hypertension, benign 12/23/2015  . History of colonoscopy with polypectomy 12/07/2016   Polypectomy x 12 per patient  . Hx of tobacco use, presenting hazards to health 12/23/2015   Quit prior to 1997  . Hyperlipidemia 12/23/2015  . Hypertension   . 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[place]) 05/14/2017  . Obesity 12/23/2015  . Pulmonary embolism ([place]) 05/18/2016   Hospitalization Sept 2017, following hip fracture   Past Surgical History:  Procedure Laterality Date  . COLONOSCOPY WITH PROPOFOL N/A 01/15/2017   Procedure: COLONOSCOPY WITH PROPOFOL;  Surgeon: Jonathon Bellows, MD;  Location: [facility] ENDOSCOPY;  Service: Endoscopy;  Laterality: N/A;  .  EYE SURGERY    . INTRAMEDULLARY (IM) NAIL INTERTROCHANTERIC Left 04/14/2016   Procedure: INTRAMEDULLARY (IM) NAIL INTERTROCHANTRIC;  Surgeon: Corky Mull, MD;  Location: [facility] ORS;  Service: Orthopedics;  Laterality: Left;  . JOINT REPLACEMENT Left 04/2016  . TOTAL HIP ARTHROPLASTY     Family History  Problem Relation Age of Onset  . Cancer Mother   . Diabetes Mother   . Heart disease Father   . Alcohol abuse Brother    Social History   Tobacco Use  . Smoking status: Former Smoker    Types: Cigarettes    Last attempt to quit: 09/01/1978    Years since quitting: 40.0  . Smokeless tobacco: Never Used  Substance Use Topics  . Alcohol use: No    Alcohol/week: 0.0 standard drinks  . Drug use: No     [facility] Visit from 09/01/2018 in [facility]  AUDIT-C Score  0      Interim medical history  since last visit reviewed. Allergies and medications reviewed  Review of Systems Per HPI unless specifically indicated above     Objective:    BP 116/72 (BP Location: Right Arm, Patient Position: Sitting, Cuff Size: Large)   Pulse 76   Temp 97.8 F (36.6 C) (Oral)   Resp 16   Ht 6' (1.829 m)   Wt 281 lb 12.8 oz (127.8 kg)   SpO2 99%   BMI 38.22 kg/m   Wt Readings from Last 3 Encounters:  09/01/18 281 lb 12.8 oz (127.8 kg)  05/29/18 272 lb 4.8 oz (123.5 kg)  02/26/18 279 lb (126.6 kg)    Physical Exam Constitutional:      General: He is not in acute distress.    Appearance: He is well-developed. He is obese.  HENT:     Head: Normocephalic and atraumatic.  Eyes:     General: No scleral icterus. Neck:     Thyroid: No thyromegaly.  Cardiovascular:     Rate and Rhythm: Normal rate and regular rhythm.  Pulmonary:     Effort: Pulmonary effort is normal.     Breath sounds: Normal breath sounds.  Abdominal:     General: Bowel sounds are normal. There is no distension.     Palpations: Abdomen is soft.  Skin:    General: Skin is warm and dry.     Coloration: Skin is not pale.  Neurological:     Mental Status: He is alert.     Coordination: Coordination normal.  Psychiatric:        Behavior: Behavior normal.        Thought Content: Thought content normal.        Judgment: Judgment normal.    Diabetic Foot Form - Detailed   Diabetic Foot Exam - detailed Diabetic Foot exam was performed with the following findings:  Yes 09/01/2018  9:47 AM  Visual Foot Exam completed.:  Yes  Pulse Foot Exam completed.:  Yes  Right Dorsalis Pedis:  Present Left Dorsalis Pedis:  Present  Sensory Foot Exam Completed.:  Yes Semmes-Weinstein Monofilament Test R Site 1-Great Toe:  Pos L Site 1-Great Toe:  Pos         Results for orders placed or performed in visit on 02/26/18  Hemoglobin A1C  Result Value Ref Range   Hgb A1c MFr Bld 6.8 (H) <5.7 % of total Hgb   Mean Plasma Glucose  148 (calc)   eAG (mmol/L) 8.2 (calc)  Microalbumin / creatinine urine ratio  Result Value  Ref Range   Creatinine, Urine 142 20 - 320 mg/dL   Microalb, Ur 13.0 mg/dL   Microalb Creat Ratio 92 (H) <30 mcg/mg creat  Lipid panel  Result Value Ref Range   Cholesterol 112 <200 mg/dL   HDL 37 (L) >40 mg/dL   Triglycerides 54 <150 mg/dL   LDL Cholesterol (Calc) 62 mg/dL (calc)   Total CHOL/HDL Ratio 3.0 <5.0 (calc)   Non-HDL Cholesterol (Calc) 75 <130 mg/dL (calc)  COMPLETE METABOLIC PANEL WITH GFR  Result Value Ref Range   Glucose, Bld 111 (H) 65 - 99 mg/dL   BUN 11 7 - 25 mg/dL   Creat 1.01 0.70 - 1.18 mg/dL   GFR, Est Non African American 74 > OR = 60 mL/min/1.43m2   GFR, Est African American 86 > OR = 60 mL/min/1.28m2   BUN/Creatinine Ratio NOT APPLICABLE 6 - 22 (calc)   Sodium 138 135 - 146 mmol/L   Potassium 4.4 3.5 - 5.3 mmol/L   Chloride 105 98 - 110 mmol/L   CO2 28 20 - 32 mmol/L   Calcium 10.2 8.6 - 10.3 mg/dL   Total Protein 7.4 6.1 - 8.1 g/dL   Albumin 4.2 3.6 - 5.1 g/dL   Globulin 3.2 1.9 - 3.7 g/dL (calc)   AG Ratio 1.3 1.0 - 2.5 (calc)   Total Bilirubin 0.7 0.2 - 1.2 mg/dL   Alkaline phosphatase (APISO) 74 40 - 115 U/L   AST 17 10 - 35 U/L   ALT 14 9 - 46 U/L      Assessment & Plan:   Problem List Items Addressed This Visit      Cardiovascular and Mediastinum   Essential hypertension, benign (Chronic)    controlled      Coronary artery disease    Work on weight loss, continue aspirin, asymptomatic      Aneurysm of aorta (HCC)    Seeing vascular doctor        Endocrine   Type 2 diabetes mellitus with diabetic nephropathy, 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HCC) - Primary    Positive urine microalb:Cr; offered referral to nutritionist, diabetic education; urged weight loss      Relevant Orders   Lipid panel   Hemoglobin A1c   Microalbumin / creatinine urine ratio   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HCC)    Encouraged healthy  eating; check labs today      Relevant Orders   Microalbumin / creatinine urine ratio     Other   Morbid obesity (HCC) (Chronic)    Urged weight loss; key to health for him, try to lose 80 pounds over the next 1.5 to 2 years      Medication monitoring encounter    Check liver and kidneys      Relevant Orders   COMPLETE METABOLIC PANEL WITH GFR   Hyperlipidemia (Chronic)    Encouraged limiting saturated fats and losing weight      Relevant Orders   Lipid panel       Follow up plan: Return in about 3 months (around 12/01/2018) for follow-up visit with Dr. Sanda Klein.  An after-visit summary was printed and given to the patient at [facility].  Please see the patient instructions which may contain other information and recommendations beyond what is mentioned above in the assessment and plan.  No orders of the defined types were placed in this encounter.   Orders Placed This Encounter  Procedures  . Lipid panel  . Hemoglobin A1c  .  Microalbumin / creatinine urine ratio  . COMPLETE METABOLIC PANEL WITH GFR

## 2018-09-01 NOTE — Assessment & Plan Note (Signed)
controlled 

## 2018-09-01 NOTE — Patient Instructions (Addendum)
Try to avoid regular sodas Diet sodas are okay in terms of not containing extra sugar  Check out the information at familydoctor.org entitled "Nutrition for Weight Loss: What You Need to Know about Fad Diets" Try to lose between 1-2 pounds per week by taking in fewer calories and burning off more calories You can succeed by limiting portions, limiting foods dense in calories and fat, becoming more active, and drinking 8 glasses of water a day (64 ounces) Don't skip meals, especially breakfast, as skipping meals may alter your metabolism Do not use over-the-counter weight loss pills or gimmicks that claim rapid weight loss A healthy BMI (or body mass index) is between 18.5 and 24.9 You can calculate your ideal BMI at the [place] website ClubMonet[employer].fr  Try to limit saturated fats in your diet (bologna, hot dogs, barbeque, cheeseburgers, hamburgers, steak, bacon, sausage, cheese, etc.) and get more fresh fruits, vegetables, and whole grains  Try to lose 12 pounds over the next 3 months

## 2018-09-01 NOTE — Assessment & Plan Note (Signed)
Encouraged healthy eating; check labs today

## 2018-09-01 NOTE — Assessment & Plan Note (Signed)
Urged weight loss; key to health for him, try to lose 80 pounds over the next 1.5 to 2 years

## 2018-09-01 NOTE — Assessment & Plan Note (Signed)
Check liver and kidneys 

## 2018-09-01 NOTE — Assessment & Plan Note (Signed)
Positive urine microalb:Cr; offered referral to nutritionist, diabetic education; urged weight loss

## 2018-09-01 NOTE — Assessment & Plan Note (Signed)
Work on weight loss, continue aspirin, asymptomatic

## 2018-09-01 NOTE — Assessment & Plan Note (Signed)
Encouraged limiting saturated fats and losing weight

## 2018-09-02 LAB — COMPLETE METABOLIC PANEL WITH GFR
AG Ratio: 1.3 (calc) (ref 1.0–2.5)
ALT: 12 U/L (ref 9–46)
AST: 16 U/L (ref 10–35)
Albumin: 4.2 g/dL (ref 3.6–5.1)
Alkaline phosphatase (APISO): 68 U/L (ref 40–115)
BUN: 15 mg/dL (ref 7–25)
CO2: 26 mmol/L (ref 20–32)
Calcium: 10.2 mg/dL (ref 8.6–10.3)
Chloride: 104 mmol/L (ref 98–110)
Creat: 1.03 mg/dL (ref 0.70–1.18)
GFR, Est African American: 84 mL/min/{1.73_m2} (ref >=60)
GFR, Est Non African American: 73 mL/min/{1.73_m2} (ref >=60)
Globulin: 3.3 g/dL (calc) (ref 1.9–3.7)
Glucose, Bld: 109 mg/dL — ABNORMAL HIGH (ref 65–99)
Potassium: 4.9 mmol/L (ref 3.5–5.3)
Sodium: 138 mmol/L (ref 135–146)
Total Bilirubin: 0.7 mg/dL (ref 0.2–1.2)
Total Protein: 7.5 g/dL (ref 6.1–8.1)

## 2018-09-02 LAB — LIPID PANEL
Cholesterol: 114 mg/dL (ref <200)
HDL: 34 mg/dL — ABNORMAL LOW (ref >40)
LDL Cholesterol (Calc): 65 mg/dL (calc)
Non-HDL Cholesterol (Calc): 80 mg/dL (calc) (ref <130)
Total CHOL/HDL Ratio: 3.4 (calc) (ref <5.0)
Triglycerides: 66 mg/dL (ref <150)

## 2018-09-02 LAB — HEMOGLOBIN A1C
Hgb A1c MFr Bld: 7.4 % of total Hgb — ABNORMAL HIGH (ref <5.7)
Mean Plasma Glucose: 166 (calc)
eAG (mmol/L): 9.2 (calc)

## 2018-09-02 LAB — MICROALBUMIN / CREATININE URINE RATIO
Creatinine, Urine: 100 mg/dL (ref 20–320)
Microalb Creat Ratio: 152 mcg/mg creat — ABNORMAL HIGH (ref <30)
Microalb, Ur: 15.2 mg/dL

## 2018-09-02 NOTE — Progress Notes (Signed)
Edgar [name], please let the patient know that his LDL cholesterol is fantastic! His HDL is not high enough, though. Low HDL is associated with obesity and can lead to plaque which can cause heart attacks. We'll really encourage him to lose weight.  His A1c is high. Please verify how much short-acting insulin he is using. It looks like he hasn't gotten a refill since last Feb, so I just want to make sure he's actually taking it before I adjust it and send more in. He is still spilling protein through his kidneys, so we really want him to lose weight and get his sugars down.

## 2018-09-05 ENCOUNTER — Other Ambulatory Visit: Payer: Self-pay | Admitting: Family Medicine

## 2018-09-05 MED ORDER — INSULIN LISPRO (1 UNIT DIAL) 100 UNIT/ML (KWIKPEN): 10.0000 [IU] | PEN_INJECTOR | Freq: Three times a day (TID) | SUBCUTANEOUS | 11 refills | Status: DC

## 2018-09-05 NOTE — Progress Notes (Signed)
Increase short0acting insulin from 8 to 9 units TID ac x 3 days and then 10 units TID ac Patient to check FSBS TID and call in 2 weeks with readings Recommend visit with CCM Almyra Free

## 2018-09-25 ENCOUNTER — Other Ambulatory Visit: Payer: Self-pay | Admitting: Family Medicine

## 2018-09-25 NOTE — Telephone Encounter (Signed)
Patient was supposed to call us with glucose readings a week or so ago Please contact him Verify current insulins and doses and what his recent sugars have been running so we can adjust further if needed Thank you

## 2018-10-11 ENCOUNTER — Other Ambulatory Visit: Payer: Self-pay | Admitting: Family Medicine

## 2018-10-11 NOTE — Telephone Encounter (Signed)
Lab Results  Component Value Date   CREATININE 1.03 09/01/2018   Lab Results  Component Value Date   K 4.9 09/01/2018

## 2018-10-17 ENCOUNTER — Other Ambulatory Visit: Payer: Self-pay

## 2018-10-17 ENCOUNTER — Ambulatory Visit (INDEPENDENT_AMBULATORY_CARE_PROVIDER_SITE_OTHER): Payer: Medicare Other

## 2018-10-17 VITALS — BP 110/70 | HR 67 | Temp 97.6°F | Resp 16 | Ht 72.0 in | Wt 278.7 lb

## 2018-10-17 DIAGNOSIS — Z0000 Encounter for general adult medical examination without abnormal findings: Secondary | ICD-10-CM | POA: Diagnosis not present

## 2018-10-17 NOTE — Progress Notes (Signed)
Subjective:   Edgar [name] is a 73 y.o. male who presents for Medicare Annual/Subsequent preventive examination.  Review of Systems:   Cardiac Risk Factors include: advanced age (>26mn, >>50women);diabetes mellitus;dyslipidemia;hypertension;obesity (BMI >30kg/m2);male gender     Objective:    Vitals: BP 110/70 (BP Location: Left Arm, Patient Position: Sitting, Cuff Size: Large)   Pulse 67   Temp 97.6 F (36.4 C) (Oral)   Resp 16   Ht 6' (1.829 m)   Wt 278 lb 11.2 oz (126.4 kg)   SpO2 93%   BMI 37.80 kg/m   Body mass index is 37.8 kg/m.  Advanced Directives 10/17/2018 05/30/2017 05/14/2017 04/30/2017 01/15/2017 12/07/2016 10/01/2016  Does Patient Have a Medical Advance Directive? No No No No No Yes No  Type of Advance Directive - - - - - Living will -  Would patient like information on creating a medical advance directive? No - Patient declined - - - - - -    Tobacco Social History   Tobacco Use  Smoking Status Former Smoker  . Types: Cigarettes  . Last attempt to quit: 09/01/1978  . Years since quitting: 40.1  Smokeless Tobacco Never Used     Counseling given: Not Answered   Clinical Intake:  Pre-visit preparation completed: Yes  Pain : No/denies pain     BMI - recorded: 37.8 Nutritional Status: BMI > 30  Obese Nutritional Risks: None Diabetes: Yes CBG done?: No Did pt. bring in CBG monitor from home?: No   Nutrition Risk Assessment:  Has the patient had any N/V/D within the last 2 months?  No  Does the patient have any non-healing wounds?  No  Has the patient had any unintentional weight loss or weight gain?  No   Diabetes:  Is the patient diabetic?  Yes  If diabetic, was a CBG obtained today?  No  Did the patient bring in their glucometer from home?  No  How often do you monitor your CBG's? daily.   Financial Strains and Diabetes Management:  Are you having any financial strains with the device, your supplies or your medication? No .  Does the  patient want to be seen by Chronic Care Management for management of their diabetes?  No  Would the patient like to be referred to a Nutritionist or for Diabetic Management?  No   Diabetic Exams:  Diabetic Eye Exam: Completed 09/17/17. Overdue for diabetic eye exam. Pt scheduled for appt this month.   Diabetic Foot Exam: Completed 09/01/18.   How often do you need to have someone help you when you read instructions, pamphlets, or other written materials from your doctor or pharmacy?: 1 - Never  Interpreter Needed?: No  Information entered by :: KClemetine MarkerLPN  Past Medical History:  Diagnosis Date  . Aneurysm of [place] ([facility] 09/11/2016   Noted on CT scan Sept 2017  . [place] 09/24/2017  . Coronary artery disease 10/25/2017   CT scan March 2019  . Diabetes mellitus without complication ([facility]   . Essential hypertension, benign 12/23/2015  . History of colonoscopy with polypectomy 12/07/2016   Polypectomy x 12 per patient  . Hx of tobacco use, presenting hazards to health 12/23/2015   Quit prior to 1997  . Hyperlipidemia 12/23/2015  . Hypertension   . 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[facility] 05/14/2017  . Obesity 12/23/2015  . Pulmonary embolism ([facility] 05/18/2016   Hospitalization Sept 2017, following hip fracture   Past Surgical  History:  Procedure Laterality Date  . COLONOSCOPY WITH PROPOFOL N/A 01/15/2017   Procedure: COLONOSCOPY WITH PROPOFOL;  Surgeon: Jonathon Bellows, MD;  Location: [facility] ENDOSCOPY;  Service: Endoscopy;  Laterality: N/A;  . EYE SURGERY    . INTRAMEDULLARY (IM) NAIL INTERTROCHANTERIC Left 04/14/2016   Procedure: INTRAMEDULLARY (IM) NAIL INTERTROCHANTRIC;  Surgeon: Corky Mull, MD;  Location: [facility] ORS;  Service: Orthopedics;  Laterality: Left;  . JOINT REPLACEMENT Left 04/2016  . TOTAL HIP ARTHROPLASTY     Family History  Problem Relation Age of Onset  . Cancer Mother   . Diabetes Mother   . Heart disease Father   . Alcohol abuse  Brother    Social History   Socioeconomic History  . Marital status: Married    Spouse name: Not on file  . Number of children: Not on file  . Years of education: Not on file  . Highest education level: Not on file  Occupational History  . Not on file  Social Needs  . Financial resource strain: Not very hard  . Food insecurity:    Worry: Never true    Inability: Never true  . Transportation needs:    Medical: No    Non-medical: No  Tobacco Use  . Smoking status: Former Smoker    Types: Cigarettes    Last attempt to quit: 09/01/1978    Years since quitting: 40.1  . Smokeless tobacco: Never Used  Substance and Sexual Activity  . Alcohol use: No    Alcohol/week: 0.0 standard drinks  . Drug use: No  . Sexual activity: Not Currently  Lifestyle  . Physical activity:    Days per week: 5 days    Minutes per session: 20 min  . Stress: Not at all  Relationships  . Social connections:    Talks on phone: More than three times a week    Gets together: Three times a week    Attends religious service: More than 4 times per year    Active member of club or organization: No    Attends meetings of clubs or organizations: Never    Relationship status: Married  Other Topics Concern  . Not on file  Social History Narrative  . Not on file    Outpatient Encounter Medications as of 10/17/2018  Medication Sig  . acetaminophen (TYLENOL) 500 MG tablet Take 500 mg by mouth every 6 (six) hours as needed.  Marland Kitchen amLODipine (NORVASC) 10 MG tablet Take 1 tablet (10 mg total) by mouth daily.  Marland Kitchen aspirin EC 81 MG tablet Take 81 mg by mouth daily.  Marland Kitchen atorvastatin (LIPITOR) 40 MG tablet Take 1 tablet (40 mg total) by mouth at bedtime.  . Blood Glucose Monitoring Suppl (ONE TOUCH ULTRA 2) w/Device KIT Check fingerstick blood sugars three times a day; DX E11.65, LON 99 months  . cholecalciferol (VITAMIN D) 1000 units tablet Take 1,000 Units by mouth 2 (two) times daily.   . Fish Oil-Cholecalciferol  (FISH OIL + D3) 1200-1000 MG-UNIT CAPS Take 1,200 mg by mouth daily.  . fluticasone (FLONASE) 50 MCG/ACT nasal spray Place 2 sprays into both nostrils daily. (Patient taking differently: Place 2 sprays into both nostrils daily as needed. )  . glucose blood (ONE TOUCH ULTRA TEST) test strip Use as instructedDx E11.65, LON 99 months; check fingerstick blood sugars three times a day  . Insulin Glargine (LANTUS) 100 UNIT/ML Solostar Pen INJECT 60 UNITS INTO THE SKIN DAILY. OR AS DIRECTED BY YOUR DOCTOR  . insulin lispro (  HUMALOG KWIKPEN) 100 UNIT/ML KwikPen Inject 0.1 mLs (10 Units total) into the skin 3 (three) times daily.  . Insulin Pen Needle (B-D UF III MINI PEN NEEDLES) 31G X 5 MM MISC USE ONCE A DAY WITH LONG-ACTING INSULIN, AND UP TO 3X A DAY WITH SHORT-ACTING INSULIN  . JARDIANCE 25 MG TABS tablet TAKE 1 TABLET BY MOUTH DAILY. (HIGHER DOSE)  . Lancets (ONETOUCH ULTRASOFT) lancets Check fingerstick blood sugar three times a day; E11.65, LON 99 months  . losartan (COZAAR) 100 MG tablet TAKE 1 TABLET (100 MG TOTAL) BY MOUTH DAILY.   No facility-administered encounter medications on file as of 10/17/2018.     Activities of Daily Living In your present state of health, do you have any difficulty performing the following activities: 10/17/2018 09/01/2018  Hearing? Y Y  Comment hearing aids -  Vision? N N  Difficulty concentrating or making decisions? N N  Walking or climbing stairs? N N  Dressing or bathing? N N  Doing errands, shopping? N N  Preparing Food and eating ? N -  Using the Toilet? N -  In the past six months, have you accidently leaked urine? N -  Do you have problems with loss of bowel control? N -  Managing your Medications? N -  Managing your Finances? N -  Housekeeping or managing your Housekeeping? N -  Some recent data might be hidden    Patient Care Team: Lada, Satira Anis, MD as PCP - General (Family Medicine) Poggi, Marshall Cork, MD as Consulting Physician (Surgery)    Assessment:   This is a routine wellness examination for [place].  Exercise Activities and Dietary recommendations Current Exercise Habits: Home exercise routine, Type of exercise: walking, Time (Minutes): 20, Frequency (Times/Week): 5, Weekly Exercise (Minutes/Week): 100, Intensity: Mild, Exercise limited by: cardiac condition(s);orthopedic condition(s)  Goals    . Weight (lb) < 250 lb (113.4 kg)     Pt would like to lose weight over the next year with healthy eating and physical activity       Fall Risk Fall Risk  10/17/2018 09/01/2018 05/29/2018 02/26/2018 11/25/2017  Falls in the past year? 0 0 No No No  Number falls in past yr: 0 0 - - -  Injury with Fall? 0 0 - - -  Follow up Falls prevention discussed - - - -   FALL RISK PREVENTION PERTAINING TO THE HOME:  Any stairs in or around the home? No  If so, do they handrails? No   Home free of loose throw rugs in walkways, pet beds, electrical cords, etc? Yes  Adequate lighting in your home to reduce risk of falls? Yes   ASSISTIVE DEVICES UTILIZED TO PREVENT FALLS:  Life alert? No  Use of a cane, walker or w/c? No  Grab bars in the bathroom? No  Shower chair or bench in shower? No  Elevated toilet seat or a handicapped toilet? No   DME ORDERS:  DME order needed?  No   TIMED UP AND GO:  Was the test performed? Yes .  Length of time to ambulate 10 feet: 7 sec.   GAIT:  Appearance of gait: Gait slow, steady and without the use of an assistive device.   Education: Fall risk prevention has been discussed.  Intervention(s) required? No   Depression Screen PHQ 2/9 Scores 10/17/2018 09/01/2018 05/29/2018 02/26/2018  PHQ - 2 Score 0 0 0 0  PHQ- 9 Score - 0 0 -    Cognitive Function  6CIT Screen 10/17/2018  What Year? 0 points  What month? 0 points  What time? 0 points  Count back from 20 0 points  Months in reverse 0 points  Repeat phrase 2 points  Total Score 2    Immunization History  Administered Date(s)  Administered  . Influenza, High Dose Seasonal PF 05/30/2015, 04/30/2017, 05/29/2018  . Influenza-Unspecified 04/25/2016  . Pneumococcal Conjugate-13 10/08/2013  . Pneumococcal Polysaccharide-23 08/26/2017  . Pneumococcal-Unspecified 04/17/2010  . Td 05/13/2009  . Zoster 10/11/2013    Qualifies for Shingles Vaccine? Yes  Zostavax completed 2015. Due for Shingrix. Education has been provided regarding the importance of this vaccine. Pt has been advised to call insurance company to determine out of pocket expense. Advised may also receive vaccine at local pharmacy or Health Dept. Verbalized acceptance and understanding.  Tdap: Up to date  Flu Vaccine: Up to date  Pneumococcal Vaccine: Up to date   Screening Tests Health Maintenance  Topic Date Due  . OPHTHALMOLOGY EXAM  09/09/2018  . HEMOGLOBIN A1C  03/02/2019  . TETANUS/TDAP  05/14/2019  . FOOT EXAM  09/02/2019  . COLONOSCOPY  01/16/2020  . INFLUENZA VACCINE  Completed  . Hepatitis C Screening  Completed  . PNA vac Low Risk Adult  Completed   Cancer Screenings:  Colorectal Screening: Completed 01/15/17. Repeat every 3 years.  Lung Cancer Screening: (Low Dose CT Chest recommended if Age 45-80 years, 30 pack-year currently smoking OR have quit w/in 15years.) does not qualify.   Additional Screening:  Hepatitis C Screening: does qualify; Completed 12/07/16  Vision Screening: Recommended annual ophthalmology exams for early detection of glaucoma and other disorders of the eye. Is the patient up to date with their annual eye exam?  Yes  Who is the provider or what is the name of the office in which the pt attends annual eye exams? [facility] Screening: Recommended annual dental exams for proper oral hygiene  Community Resource Referral:  CRR required this visit?  No       Plan:    I have personally reviewed and addressed the Medicare Annual Wellness questionnaire and have noted the following in the  patient's chart:  A. Medical and social history B. Use of alcohol, tobacco or illicit drugs  C. Current medications and supplements D. Functional ability and status E.  Nutritional status F.  Physical activity G. Advance directives H. List of other physicians I.  Hospitalizations, surgeries, and ER visits in previous 12 months J.  [place] such as hearing and vision if needed, cognitive and depression L. Referrals and appointments   In addition, I have reviewed and discussed with patient certain preventive protocols, quality metrics, and best practice recommendations. A written personalized care plan for preventive services as well as general preventive health recommendations were provided to patient.   Signed,  Clemetine Marker, LPN Nurse Health Advisor   Nurse Notes: pt doing well and appreciative of visit today.

## 2018-10-17 NOTE — Patient Instructions (Signed)
Edgar [name] , Thank you for taking time to come for your Medicare Wellness Visit. I appreciate your ongoing commitment to your health goals. Please review the following plan we discussed and let me know if I can assist you in the future.   Screening recommendations/referrals: Colonoscopy: done 01/15/17. Repeat in 2021. Recommended yearly ophthalmology/optometry visit for glaucoma screening and checkup Recommended yearly dental visit for hygiene and checkup  Vaccinations: Influenza vaccine: done 05/29/18 Pneumococcal vaccine: done 08/26/17 Tdap vaccine: done 05/13/09 Shingles vaccine: Shingrix discussed. Please contact your pharmacy for coverage information.   Advanced directives: Advance directive discussed with you today. Even though you declined this today please call our office should you change your mind and we can give you the proper paperwork for you to fill out.  Conditions/risks identified: Recommend healthy eating and physical activity for desired weight loss.   Next appointment: Please follow up in one year for your Medicare Annual Wellness visit.    Preventive Care 28 Years and Older, Male Preventive care refers to lifestyle choices and visits with your health care provider that can promote health and wellness. What does preventive care include?  A yearly physical exam. This is also called an annual well check.  Dental exams once or twice a year.  Routine eye exams. Ask your health care provider how often you should have your eyes checked.  Personal lifestyle choices, including:  Daily care of your teeth and gums.  Regular physical activity.  Eating a healthy diet.  Avoiding tobacco and drug use.  Limiting alcohol use.  Practicing safe sex.  Taking low doses of aspirin every day.  Taking vitamin and mineral supplements as recommended by your health care provider. What happens during an annual well check? The services and screenings done by your health care  provider during your annual well check will depend on your age, overall health, lifestyle risk factors, and family history of disease. Counseling  Your health care provider may ask you questions about your:  Alcohol use.  Tobacco use.  Drug use.  Emotional well-being.  Home and relationship well-being.  Sexual activity.  Eating habits.  History of falls.  Memory and ability to understand (cognition).  Work and work Statistician. Screening  You may have the following tests or measurements:  Height, weight, and BMI.  Blood pressure.  Lipid and cholesterol levels. These may be checked every 5 years, or more frequently if you are over 21 years old.  Skin check.  Lung cancer screening. You may have this screening every year starting at age 32 if you have a 30-pack-year history of smoking and currently smoke or have quit within the past 15 years.  Fecal occult blood test (FOBT) of the stool. You may have this test every year starting at age 26.  Flexible sigmoidoscopy or colonoscopy. You may have a sigmoidoscopy every 5 years or a colonoscopy every 10 years starting at age 59.  Prostate cancer screening. Recommendations will vary depending on your family history and other risks.  Hepatitis C blood test.  Hepatitis B blood test.  Sexually transmitted disease (STD) testing.  Diabetes screening. This is done by checking your blood sugar (glucose) after you have not eaten for a while (fasting). You may have this done every 1-3 years.  Abdominal aortic aneurysm (AAA) screening. You may need this if you are a current or former smoker.  Osteoporosis. You may be screened starting at age 68 if you are at high risk. Talk with your health care provider  about your test results, treatment options, and if necessary, the need for more tests. Vaccines  Your health care provider may recommend certain vaccines, such as:  Influenza vaccine. This is recommended every year.  Tetanus,  diphtheria, and acellular pertussis (Tdap, Td) vaccine. You may need a Td booster every 10 years.  Zoster vaccine. You may need this after age 9.  Pneumococcal 13-valent conjugate (PCV13) vaccine. One dose is recommended after age 104.  Pneumococcal polysaccharide (PPSV23) vaccine. One dose is recommended after age 12. Talk to your health care provider about which screenings and vaccines you need and how often you need them. This information is not intended to replace advice given to you by your health care provider. Make sure you discuss any questions you have with your health care provider. Document Released: 08/19/2015 Document Revised: 04/11/2016 Document Reviewed: 05/24/2015 Elsevier Interactive Patient Education  2017 [place] Prevention in the Home Falls can cause injuries. They can happen to people of all ages. There are many things you can do to make your home safe and to help prevent falls. What can I do on the outside of my home?  Regularly fix the edges of walkways and driveways and fix any cracks.  Remove anything that might make you trip as you walk through a door, such as a raised step or threshold.  Trim any bushes or trees on the path to your home.  Use bright outdoor lighting.  Clear any walking paths of anything that might make someone trip, such as rocks or tools.  Regularly check to see if handrails are loose or broken. Make sure that both sides of any steps have handrails.  Any raised decks and porches should have guardrails on the edges.  Have any leaves, snow, or ice cleared regularly.  Use sand or salt on walking paths during winter.  Clean up any spills in your garage right away. This includes oil or grease spills. What can I do in the bathroom?  Use night lights.  Install grab bars by the toilet and in the tub and shower. Do not use towel bars as grab bars.  Use non-skid mats or decals in the tub or shower.  If you need to sit down in  the shower, use a plastic, non-slip stool.  Keep the floor dry. Clean up any water that spills on the floor as soon as it happens.  Remove soap buildup in the tub or shower regularly.  Attach bath mats securely with double-sided non-slip rug tape.  Do not have throw rugs and other things on the floor that can make you trip. What can I do in the bedroom?  Use night lights.  Make sure that you have a light by your bed that is easy to reach.  Do not use any sheets or blankets that are too big for your bed. They should not hang down onto the floor.  Have a firm chair that has side arms. You can use this for support while you get dressed.  Do not have throw rugs and other things on the floor that can make you trip. What can I do in the kitchen?  Clean up any spills right away.  Avoid walking on wet floors.  Keep items that you use a lot in easy-to-reach places.  If you need to reach something above you, use a strong step stool that has a grab bar.  Keep electrical cords out of the way.  Do not use floor polish or  wax that makes floors slippery. If you must use wax, use non-skid floor wax.  Do not have throw rugs and other things on the floor that can make you trip. What can I do with my stairs?  Do not leave any items on the stairs.  Make sure that there are handrails on both sides of the stairs and use them. Fix handrails that are broken or loose. Make sure that handrails are as long as the stairways.  Check any carpeting to make sure that it is firmly attached to the stairs. Fix any carpet that is loose or worn.  Avoid having throw rugs at the top or bottom of the stairs. If you do have throw rugs, attach them to the floor with carpet tape.  Make sure that you have a light switch at the top of the stairs and the bottom of the stairs. If you do not have them, ask someone to add them for you. What else can I do to help prevent falls?  Wear shoes that:  Do not have high  heels.  Have rubber bottoms.  Are comfortable and fit you well.  Are closed at the toe. Do not wear sandals.  If you use a stepladder:  Make sure that it is fully opened. Do not climb a closed stepladder.  Make sure that both sides of the stepladder are locked into place.  Ask someone to hold it for you, if possible.  Clearly mark and make sure that you can see:  Any grab bars or handrails.  First and last steps.  Where the edge of each step is.  Use tools that help you move around (mobility aids) if they are needed. These include:  Canes.  Walkers.  Scooters.  Crutches.  Turn on the lights when you go into a dark area. Replace any light bulbs as soon as they burn out.  Set up your furniture so you have a clear path. Avoid moving your furniture around.  If any of your floors are uneven, fix them.  If there are any pets around you, be aware of where they are.  Review your medicines with your doctor. Some medicines can make you feel dizzy. This can increase your chance of falling. Ask your doctor what other things that you can do to help prevent falls. This information is not intended to replace advice given to you by your health care provider. Make sure you discuss any questions you have with your health care provider. Document Released: 05/19/2009 Document Revised: 12/29/2015 Document Reviewed: 08/27/2014 Elsevier Interactive Patient Education  2017 [employer].

## 2018-10-20 LAB — HM DIABETES EYE EXAM

## 2018-11-16 ENCOUNTER — Other Ambulatory Visit: Payer: Self-pay | Admitting: Family Medicine

## 2018-11-28 ENCOUNTER — Ambulatory Visit (INDEPENDENT_AMBULATORY_CARE_PROVIDER_SITE_OTHER): Payer: Medicare Other | Admitting: Vascular Surgery

## 2018-12-02 ENCOUNTER — Other Ambulatory Visit: Payer: Self-pay

## 2018-12-02 ENCOUNTER — Ambulatory Visit (INDEPENDENT_AMBULATORY_CARE_PROVIDER_SITE_OTHER): Payer: Medicare Other | Admitting: Nurse Practitioner

## 2018-12-02 ENCOUNTER — Encounter: Payer: Self-pay | Admitting: Nurse Practitioner

## 2018-12-02 ENCOUNTER — Ambulatory Visit: Payer: Self-pay

## 2018-12-02 VITALS — BP 120/68 | HR 80 | Temp 98.9°F | Resp 14 | Ht 72.0 in | Wt 282.9 lb

## 2018-12-02 DIAGNOSIS — K648 Other hemorrhoids: Secondary | ICD-10-CM | POA: Diagnosis not present

## 2018-12-02 DIAGNOSIS — K625 Hemorrhage of anus and rectum: Secondary | ICD-10-CM | POA: Diagnosis not present

## 2018-12-02 DIAGNOSIS — Z794 Long term (current) use of insulin: Secondary | ICD-10-CM

## 2018-12-02 DIAGNOSIS — E1121 Type 2 diabetes mellitus with diabetic nephropathy: Secondary | ICD-10-CM | POA: Diagnosis not present

## 2018-12-02 LAB — HEMOCCULT GUIAC POC 1CARD (OFFICE): Fecal Occult Blood, POC: POSITIVE — AB

## 2018-12-02 MED ORDER — HYDROCORTISONE ACETATE 25 MG RE SUPP: 25.0000 mg | Freq: Two times a day (BID) | RECTAL | 0 refills | Status: DC

## 2018-12-02 NOTE — Progress Notes (Signed)
Name: Edgar [name]   MRN: [id number]    DOB: [birth year]   Date:12/02/2018       Progress Note  Subjective  Chief Complaint  Chief Complaint  Patient presents with  . Rectal Bleeding    HPI  Patient  reports episode of a lot of bright red blood in underwear this morning.  Pt. Stated he has seen bright red blood on the toilet tissue, when he wipes for several months. States he thinks he mentioned this to PCP but not sure.  Stated today is the most bright red blood he has seen.  Denied seeing any clots mixed in blood in underwear.  Stated he has occasional constipation and has to strain to have a BM.  Stated he felt pressure and passed the bright blood without stool this morning, in underwear.  Denied dizziness, nausea / vomiting, fever, or pain at rectum.  C/o intermittent abdominal pain.  Stated he takes a daily ASA.   _____  Patient last had colonoscopy in 2018 and 6 polyps removed; 1 polyp being greater than 92m, also noted internal hemorrhoids- non-bleeding at that time and diverticulosis. Patient denies diarrhea. Patient endorses intermittent constipation. States has 1-3 bowel movements a day, states does not strain often. Patient endorses mild generalized abdominal pain if he takes his medications late. Denies nausea, vomiting, fevers, chills. Denies lightheadedness or dizziness.    PHQ2/9: Depression screen [employer]2/9 12/02/2018 10/17/2018 09/01/2018 05/29/2018 02/26/2018  Decreased Interest 0 0 0 0 0  Down, Depressed, Hopeless 0 0 0 0 0  PHQ - 2 Score 0 0 0 0 0  Altered sleeping 0 - 0 0 -  Tired, decreased energy 0 - 0 0 -  Change in appetite 0 - 0 0 -  Feeling bad or failure about yourself  0 - 0 0 -  Trouble concentrating 0 - 0 0 -  Moving slowly or fidgety/restless 0 - 0 0 -  Suicidal thoughts 0 - 0 0 -  PHQ-9 Score 0 - 0 0 -  Difficult doing work/chores Not difficult at all - Not difficult at all Not difficult at all -     PHQ reviewed. Negative  Patient Active Problem List    Diagnosis Date Noted  . Coronary artery disease 10/25/2017  . Aneurysm of thoracic aorta ([facility] 09/24/2017  . 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[facility] 05/14/2017  . BRBPR (bright red blood per rectum) 12/07/2016  . History of colonoscopy with polypectomy 12/07/2016  . Medication monitoring encounter 12/07/2016  . Fatty liver 09/11/2016  . Aneurysm of aorta ([facility] 09/11/2016  . 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 Tinea pedis 06/25/2016  . Closed intertrochanteric fracture of left hip, sequela 05/30/2016  . Hx of pulmonary embolus 05/18/2016  . Essential hypertension, benign 12/23/2015  . Hyperlipidemia 12/23/2015  . Hx of tobacco use, presenting hazards to health 12/23/2015  . Morbid obesity ([facility] 12/23/2015  . Type 2 diabetes mellitus with diabetic nephropathy, 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[facility] 01/25/2015    Past Medical History:  Diagnosis Date  . Aneurysm of aorta ([facility] 09/11/2016   Noted on CT scan Sept 2017  . Aneurysm of thoracic aorta ([facility] 09/24/2017  . Coronary artery disease 10/25/2017   CT scan March 2019  . Diabetes mellitus without complication ([facility]   . Essential hypertension, benign 12/23/2015  . History of colonoscopy with polypectomy 12/07/2016   Polypectomy x 12 per patient  . Hx of tobacco use, presenting hazards to health 12/23/2015  Quit prior to 1997  . Hyperlipidemia 12/23/2015  . Hypertension   . 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[place]) 05/14/2017  . Obesity 12/23/2015  . Pulmonary embolism ([place]) 05/18/2016   Hospitalization Sept 2017, following hip fracture    Past Surgical History:  Procedure Laterality Date  . COLONOSCOPY WITH PROPOFOL N/A 01/15/2017   Procedure: COLONOSCOPY WITH PROPOFOL;  Surgeon: Jonathon Bellows, MD;  Location: [facility] ENDOSCOPY;  Service: Endoscopy;  Laterality: N/A;  . EYE SURGERY    . INTRAMEDULLARY (IM) NAIL INTERTROCHANTERIC Left 04/14/2016   Procedure: INTRAMEDULLARY (IM) NAIL INTERTROCHANTRIC;  Surgeon:  Corky Mull, MD;  Location: [facility] ORS;  Service: Orthopedics;  Laterality: Left;  . JOINT REPLACEMENT Left 04/2016  . TOTAL HIP ARTHROPLASTY      Social History   Tobacco Use  . Smoking status: Former Smoker    Types: Cigarettes    Last attempt to quit: 09/01/1978    Years since quitting: 40.2  . Smokeless tobacco: Never Used  Substance Use Topics  . Alcohol use: No    Alcohol/week: 0.0 standard drinks     Current Outpatient Medications:  .  acetaminophen (TYLENOL) 500 MG tablet, Take 500 mg by mouth every 6 (six) hours as needed., Disp: , Rfl:  .  amLODipine (NORVASC) 10 MG tablet, Take 1 tablet (10 mg total) by mouth daily., Disp: 30 tablet, Rfl: 11 .  aspirin EC 81 MG tablet, Take 81 mg by mouth daily., Disp: , Rfl:  .  atorvastatin (LIPITOR) 40 MG tablet, Take 1 tablet (40 mg total) by mouth at bedtime., Disp: 90 tablet, Rfl: 3 .  cholecalciferol (VITAMIN D) 1000 units tablet, Take 1,000 Units by mouth 2 (two) times daily. , Disp: , Rfl:  .  Fish Oil-Cholecalciferol (FISH OIL + D3) 1200-1000 MG-UNIT CAPS, Take 1,200 mg by mouth daily., Disp: , Rfl:  .  fluticasone (FLONASE) 50 MCG/ACT nasal spray, Place 2 sprays into both nostrils daily. (Patient taking differently: Place 2 sprays into both nostrils daily as needed. ), Disp: 16 g, Rfl: 6 .  Insulin Glargine (LANTUS) 100 UNIT/ML Solostar Pen, INJECT 60 UNITS INTO THE SKIN DAILY. OR AS DIRECTED BY YOUR DOCTOR, Disp: 5 pen, Rfl: 5 .  insulin lispro (HUMALOG KWIKPEN) 100 UNIT/ML KwikPen, Inject 0.1 mLs (10 Units total) into the skin 3 (three) times daily., Disp: 15 mL, Rfl: 11 .  JARDIANCE 25 MG TABS tablet, TAKE 1 TABLET BY MOUTH DAILY. (HIGHER DOSE), Disp: 90 tablet, Rfl: 3 .  losartan (COZAAR) 100 MG tablet, TAKE 1 TABLET (100 MG TOTAL) BY MOUTH DAILY., Disp: 90 tablet, Rfl: 2 .  Blood Glucose Monitoring Suppl (ONE TOUCH ULTRA 2) w/Device KIT, Check fingerstick blood sugars three times a day; DX E11.65, LON 99 months, Disp: 1 each,  Rfl: 0 .  glucose blood (ONE TOUCH ULTRA TEST) test strip, USE AS INSTRUCTEDDX E11.65, LON 99 MONTHS CHECK FINGERSTICK BLOOD SUGARS THREE TIMES A DAY, Disp: 300 each, Rfl: 11 .  Insulin Pen Needle (B-D UF III MINI PEN NEEDLES) 31G X 5 MM MISC, USE ONCE A DAY WITH LONG-ACTING INSULIN, AND UP TO 3X A DAY WITH SHORT-ACTING INSULIN, Disp: 300 each, Rfl: 3 .  Lancets (ONETOUCH ULTRASOFT) lancets, Check fingerstick blood sugar three times a day; E11.65, LON 99 months, Disp: 100 each, Rfl: 12  No Known Allergies  ROS    No other specific complaints in a complete review of systems (except as listed in HPI above).  Objective  Vitals:  12/02/18 1406  BP: 120/68  Pulse: 80  Resp: 14  Temp: 98.9 F (37.2 C)  TempSrc: Oral  SpO2: 96%  Weight: 282 lb 14.4 oz (128.3 kg)  Height: 6' (1.829 m)     Body mass index is 38.37 kg/m.  Nursing Note and Vital Signs reviewed.  Physical Exam Vitals signs reviewed.  Constitutional:      Appearance: He is well-developed.  HENT:     Head: Normocephalic and atraumatic.  Neck:     Musculoskeletal: Normal range of motion and neck supple.     Vascular: No carotid bruit.  Cardiovascular:     Heart sounds: Normal heart sounds.  Pulmonary:     Effort: Pulmonary effort is normal.     Breath sounds: Normal breath sounds.  Abdominal:     General: Bowel sounds are normal.     Palpations: Abdomen is soft.     Tenderness: There is no abdominal tenderness.  Genitourinary:    Rectum: Normal. Guaiac result positive.     Comments: On external hemorrhoids noted, rectal tone intact Musculoskeletal: Normal range of motion.  Skin:    General: Skin is warm and dry.     Capillary Refill: Capillary refill takes less than 2 seconds.  Neurological:     Mental Status: He is alert and oriented to person, place, and time.     GCS: GCS eye subscore is 4. GCS verbal subscore is 5. GCS motor subscore is 6.     Sensory: No sensory deficit.  Psychiatric:         Speech: Speech normal.        Behavior: Behavior normal.        Thought Content: Thought content normal.        Judgment: Judgment normal.        No results found for this or any previous visit (from the past 48 hour(s)).  Assessment & Plan  1. Bright red blood per rectum Grossly positive hemoccult, despite no visualization of bright red blood. Likely bleeding hemorrhoids. Hold aspirin for 2 weeks. Discussed colonoscopy report in detail, patient requesting GI consult for consideration of procedural management post-pandemic if needed. SITZ baths.  - hydrocortisone (ANUSOL-HC) 25 MG suppository; Place 1 suppository (25 mg total) rectally 2 (two) times daily.  Dispense: 14 suppository; Refill: 0 - CBC with Differential - POCT Occult Blood Stool - Ambulatory referral to Gastroenterology  2. Internal hemorrhoids - hydrocortisone (ANUSOL-HC) 25 MG suppository; Place 1 suppository (25 mg total) rectally 2 (two) times daily.  Dispense: 14 suppository; Refill: 0 - Ambulatory referral to Gastroenterology  3. Type 2 diabetes mellitus with diabetic nephropathy, 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HCC) Will discuss at routine follow-up  - HgB A1c   -Red flags and when to present for emergency care or RTC including fever, severe abdominal pain, increasing blood loss, lightheadedness, dizziness new/worsening/un-resolving symptoms,  reviewed with patient at time of visit. Follow up and care instructions discussed and provided in AVS.  Face-to-face time with patient was more than 25 minutes, >50% time spent counseling and coordination of care

## 2018-12-02 NOTE — Patient Instructions (Addendum)
-You have not heard anything from my staff in a week about any orders/referrals/studies from today, please contact us here to follow-up [phone]  -Please stop your aspirin for 2 weeks or until you see your GI doctor and are told otherwise. If you do not receive a phone call about your GI appointment in the next 2 weeks please call to let us know.   -If you develop worsening bleeding, lightheadedness, dizziness, severe pain please call 911.    Hemorrhoids Hemorrhoids are swollen veins in and around the rectum or anus. There are two types of hemorrhoids:  Internal hemorrhoids. These occur in the veins that are just inside the rectum. They may poke through to the outside and become irritated and painful.  External hemorrhoids. These occur in the veins that are outside the anus and can be felt as a painful swelling or hard lump near the anus. Most hemorrhoids do not cause serious problems, and they can be managed with home treatments such as diet and lifestyle changes. If home treatments do not help the symptoms, procedures can be done to shrink or remove the hemorrhoids. What are the causes? This condition is caused by increased pressure in the anal area. This pressure may result from various things, including:  Constipation.  Straining to have a bowel movement.  Diarrhea.  Pregnancy.  Obesity.  Sitting for long periods of time.  Heavy lifting or other activity that causes you to strain.  Anal sex.  Riding a bike for a long period of time. What are the signs or symptoms? Symptoms of this condition include:  Pain.  Anal itching or irritation.  Rectal bleeding.  Leakage of stool (feces).  Anal swelling.  One or more lumps around the anus. How is this diagnosed? This condition can often be diagnosed through a visual exam. Other exams or tests may also be done, such as:  An exam that involves feeling the rectal area with a gloved hand (digital rectal exam).  An  exam of the anal canal that is done using a small tube (anoscope).  A blood test, if you have lost a significant amount of blood.  A test to look inside the colon using a flexible tube with a camera on the end (sigmoidoscopy or colonoscopy). How is this treated? This condition can usually be treated at home. However, various procedures may be done if dietary changes, lifestyle changes, and other home treatments do not help your symptoms. These procedures can help make the hemorrhoids smaller or remove them completely. Some of these procedures involve surgery, and others do not. Common procedures include:  Rubber band ligation. Rubber bands are placed at the base of the hemorrhoids to cut off their blood supply.  Sclerotherapy. Medicine is injected into the hemorrhoids to shrink them.  Infrared coagulation. A type of light energy is used to get rid of the hemorrhoids.  Hemorrhoidectomy surgery. The hemorrhoids are surgically removed, and the veins that supply them are tied off.  Stapled hemorrhoidopexy surgery. The surgeon staples the base of the hemorrhoid to the rectal wall. Follow these instructions at home: Eating and drinking   Eat foods that have a lot of fiber in them, such as whole grains, beans, nuts, fruits, and vegetables.  Ask your health care provider about taking products that have added fiber (fiber supplements).  Reduce the amount of fat in your diet. You can do this by eating low-fat dairy products, eating less red meat, and avoiding processed foods.  Drink enough fluid  to keep your urine pale yellow. Managing pain and swelling   Take warm sitz baths for 20 minutes, 3-4 times a day to ease pain and discomfort. You may do this in a bathtub or using a portable sitz bath that fits over the toilet.  If directed, apply ice to the affected area. Using ice packs between sitz baths may be helpful. ? Put ice in a plastic bag. ? Place a towel between your skin and the  bag. ? Leave the ice on for 20 minutes, 2-3 times a day. General instructions  Take over-the-counter and prescription medicines only as told by your health care provider.  Use medicated creams or suppositories as told.  Get regular exercise. Ask your health care provider how much and what kind of exercise is best for you. In general, you should do moderate exercise for at least 30 minutes on most days of the week (150 minutes each week). This can include activities such as walking, biking, or yoga.  Go to the bathroom when you have the urge to have a bowel movement. Do not wait.  Avoid straining to have bowel movements.  Keep the anal area dry and clean. Use wet toilet paper or moist towelettes after a bowel movement.  Do not sit on the toilet for long periods of time. This increases blood pooling and pain.  Keep all follow-up visits as told by your health care provider. This is important. Contact a health care provider if you have:  Increasing pain and swelling that are not controlled by treatment or medicine.  Difficulty having a bowel movement, or you are unable to have a bowel movement.  Pain or inflammation outside the area of the hemorrhoids. Get help right away if you have:  Uncontrolled bleeding from your rectum. Summary  Hemorrhoids are swollen veins in and around the rectum or anus.  Most hemorrhoids can be managed with home treatments such as diet and lifestyle changes.  Taking warm sitz baths can help ease pain and discomfort.  In severe cases, procedures or surgery can be done to shrink or remove the hemorrhoids. This information is not intended to replace advice given to you by your health care provider. Make sure you discuss any questions you have with your health care provider. Document Released: 07/20/2000 Document Revised: 12/12/2017 Document Reviewed: 12/12/2017 Elsevier Interactive Patient Education  2019 [employer].   How to Take a [employer] A sitz  bath is a warm water bath that may be used to care for your rectum, genital area, or the area between your rectum and genitals (perineum). For a sitz bath, the water only comes up to your hips and covers your buttocks. A sitz bath may done at home in a bathtub or with a portable sitz bath that fits over the toilet. Your health care provider may recommend a sitz bath to help:  Relieve pain and discomfort after delivering a baby.  Relieve pain and itching from hemorrhoids or anal fissures.  Relieve pain after certain surgeries.  Relax muscles that are sore or tight. How to take a sitz bath Take 3-4 sitz baths a day, or as many as told by your health care provider. Bathtub sitz bath To take a sitz bath in a bathtub: 1. Partially fill a bathtub with warm water. The water should be deep enough to cover your hips and buttocks when you are sitting in the tub. 2. If your health care provider told you to put medicine in the water, follow  his or her instructions. 3. Sit in the water. 4. Open the tub drain a little, and leave it open during your bath. 5. Turn on the warm water again, enough to replace the water that is draining out. Keep the water running throughout your bath. This helps keep the water at the right level and the right temperature. 6. Soak in the water for 15-20 minutes, or as long as told by your health care provider. 7. When you are done, be careful when you stand up. You may feel dizzy. 8. After the sitz bath, pat yourself dry. Do not rub your skin to dry it.  Over-the-toilet sitz bath To take a sitz bath with an over-the-toilet basin: 1. Follow the manufacturer's instructions. 2. Fill the basin with warm water. 3. If your health care provider told you to put medicine in the water, follow his or her instructions. 4. Sit on the seat. Make sure the water covers your buttocks and perineum. 5. Soak in the water for 15-20 minutes, or as long as told by your health care  provider. 6. After the sitz bath, pat yourself dry. Do not rub your skin to dry it. 7. Clean and dry the basin between uses. 8. Discard the basin if it cracks, or according to the manufacturer's instructions. Contact a health care provider if:  Your symptoms get worse. Do not continue with sitz baths if your symptoms get worse.  You have new symptoms. If this happens, do not continue with sitz baths until you talk with your health care provider. Summary  A sitz bath is a warm water bath in which the water only comes up to your hips and covers your buttocks.  A sitz bath may help relieve itching, relieve pain, and relax muscles that are sore or tight in the lower part of your body, including your genital area.  Take 3-4 sitz baths a day, or as many as told by your health care provider. Soak in the water for 15-20 minutes.  Do not continue with sitz baths if your symptoms get worse. This information is not intended to replace advice given to you by your health care provider. Make sure you discuss any questions you have with your health care provider. Document Released: 04/14/2004 Document Revised: 07/25/2017 Document Reviewed: 07/25/2017 Elsevier Interactive Patient Education  2019 [employer].

## 2018-12-02 NOTE — Telephone Encounter (Signed)
Pt. and wife called to report episode of a lot of bright red blood in underwear this morning.  Pt. Stated he has seen bright red blood on the toilet tissue, when he wipes for several days.  Stated today is the most bright red blood he has seen.  Denied seeing any clots mixed in blood in underwear.  Stated he has occasional constipation and has to strain to have a BM.  Stated he felt pressure and passed the bright blood without stool this morning, in underwear.  Denied dizziness, nausea / vomiting, fever, or pain at rectum.  C/o intermittent abdominal pain.  Stated he takes a daily ASA.  Called FC; transferred pt. To the office to have appt. Scheduled today.  Agreed with plan.    Reason for Disposition . MODERATE rectal bleeding (small blood clots, passing blood without stool, or toilet water turns red)  Answer Assessment - Initial Assessment Questions 1. APPEARANCE of BLOOD: "What color is it?" "Is it passed separately, on the surface of the stool, or mixed in with the stool?"      Bright red blood in underwear this AM 2. AMOUNT: "How much blood was passed?"      Large amt.  3. FREQUENCY: "How many times has blood been passed with the stools?"      "every now and then it is spotty when I wipe" 4. ONSET: "When was the blood first seen in the stools?" (Days or weeks)      Several days  5. DIARRHEA: "Is there also some diarrhea?" If so, ask: "How many diarrhea stools were passed in past 24 hours?"      Denied diarrhea  6. CONSTIPATION: "Do you have constipation?" If so, "How bad is it?"     Occasionally constipated 7. RECURRENT SYMPTOMS: "Have you had blood in your stools before?" If so, ask: "When was the last time?" and "What happened that time?"      No  8. BLOOD THINNERS: "Do you take any blood thinners?" (e.g., Coumadin/warfarin, Pradaxa/dabigatran, aspirin)     Daily ASA  9. OTHER SYMPTOMS: "Do you have any other symptoms?"  (e.g., abdominal pain, vomiting, dizziness, fever)  Intermittent Abdominal pain, denied nausea or vomiting, no fever, no dizziness  10. PREGNANCY: "Is there any chance you are pregnant?" "When was your last menstrual period?"     n/a  Protocols used: RECTAL BLEEDING-A-AH

## 2018-12-03 LAB — CBC WITH DIFFERENTIAL/PLATELET
Absolute Monocytes: 510 cells/uL (ref 200–950)
Basophils Absolute: 61 cells/uL (ref 0–200)
Basophils Relative: 0.9 %
Eosinophils Absolute: 177 cells/uL (ref 15–500)
Eosinophils Relative: 2.6 %
HCT: 47.2 % (ref 38.5–50.0)
Hemoglobin: 15.6 g/dL (ref 13.2–17.1)
Lymphs Abs: 2530 cells/uL (ref 850–3900)
MCH: 28.4 pg (ref 27.0–33.0)
MCHC: 33.1 g/dL (ref 32.0–36.0)
MCV: 86 fL (ref 80.0–100.0)
MPV: 10.1 fL (ref 7.5–12.5)
Monocytes Relative: 7.5 %
Neutro Abs: 3522 cells/uL (ref 1500–7800)
Neutrophils Relative %: 51.8 %
Platelets: 271 10*3/uL (ref 140–400)
RBC: 5.49 10*6/uL (ref 4.20–5.80)
RDW: 13.1 % (ref 11.0–15.0)
Total Lymphocyte: 37.2 %
WBC: 6.8 10*3/uL (ref 3.8–10.8)

## 2018-12-03 LAB — HEMOGLOBIN A1C
Hgb A1c MFr Bld: 7.2 % of total Hgb — ABNORMAL HIGH (ref <5.7)
Mean Plasma Glucose: 160 (calc)
eAG (mmol/L): 8.9 (calc)

## 2018-12-04 ENCOUNTER — Ambulatory Visit: Payer: Medicare Other | Admitting: Family Medicine

## 2018-12-04 ENCOUNTER — Encounter: Payer: Self-pay | Admitting: *Deleted

## 2018-12-09 ENCOUNTER — Ambulatory Visit (INDEPENDENT_AMBULATORY_CARE_PROVIDER_SITE_OTHER): Payer: Medicare Other | Admitting: Nurse Practitioner

## 2018-12-09 NOTE — Progress Notes (Signed)
Canceled.

## 2018-12-11 ENCOUNTER — Other Ambulatory Visit: Payer: Self-pay

## 2018-12-11 ENCOUNTER — Ambulatory Visit: Payer: Medicare Other | Admitting: Family Medicine

## 2018-12-11 ENCOUNTER — Encounter: Payer: Self-pay | Admitting: Family Medicine

## 2018-12-11 VITALS — BP 118/82 | HR 100 | Temp 98.3°F | Resp 14 | Ht 72.0 in | Wt 281.3 lb

## 2018-12-11 DIAGNOSIS — E1121 Type 2 diabetes mellitus with diabetic nephropathy: Secondary | ICD-10-CM

## 2018-12-11 DIAGNOSIS — K625 Hemorrhage of anus and rectum: Secondary | ICD-10-CM | POA: Diagnosis not present

## 2018-12-11 DIAGNOSIS — E1129 Type 2 diabetes mellitus with other diabetic kidney complication: Secondary | ICD-10-CM | POA: Diagnosis not present

## 2018-12-11 DIAGNOSIS — Z794 Long term (current) use of insulin: Secondary | ICD-10-CM

## 2018-12-11 DIAGNOSIS — R809 Proteinuria, unspecified: Secondary | ICD-10-CM

## 2018-12-11 DIAGNOSIS — E782 Mixed hyperlipidemia: Secondary | ICD-10-CM

## 2018-12-11 DIAGNOSIS — I10 Essential (primary) hypertension: Secondary | ICD-10-CM | POA: Diagnosis not present

## 2018-12-11 NOTE — Progress Notes (Signed)
Name: Edgar [name]   MRN: [id number]    DOB: 1946/05/13   Date:12/11/2018       Progress Note  Subjective  Chief Complaint  Chief Complaint  Patient presents with  . Rectal Bleeding    and pain onset 1 month    HPI  Having rectal bleeding for 2-3 months - every time he wipes there is a small amount of BRB on the tissue paper.  Last week he was sitting and watching TV and his rectum has a moderate amount of blood in his underwear.  He was seen by Benjamine Mola NP-C 12/02/2018 and was give anusol suppositories - he used these for 7 days and this helped with the moderate amount of bleeding, but he his still having small amnt on the tissue paper. She also performed CBC, CMP, and A1C all of which were stable; hemoccult was positive. - Patient last had colonoscopy in 2018 and 6 polyps removed; 1 polyp being greater than 44m, also noted internal hemorrhoids- non-bleeding at that time and diverticulosis. Patient denies diarrhea. Patient endorses intermittent constipation. States has 1-2 bowel movements a day, states does not strain often. Patient endorses mild generalized abdominal pain if he takes his medications late. Denies nausea, vomiting, fevers, chills. Denies lightheadedness or dizziness.  DM: Checking BG's occasionally - unsure of what his levels have been lately.  A1C was 7.2% at last check.  Denies polydipsia, polyphagia, or polyuria. He is taking humalog 10 units TID, using 60 units Lantus daily, taking Jardiance 273m  Does not drink sweet beverages, does eat some carbohydrates, but tries to limit. On ARB and statin. Urine Micro is UTD.  Off 8156mSA until cleared by GI per patient.  Discussed increasing Lantus to 64 units, but he does not want to change his medications at this time.   HLD: Denies chest pain, shortness of breath, or myalgias.  Taking statin and fish oil daily. Last LDL was <70.  HTN: Denies chest pain, shorntess of breath, BLE edema, headaches.  Taking losartan and  amlodipine daily without issues. Recent CMP was normal.  Obesity: Limiting portions, not walking outside due to concern over COVID-19  Hx PE: Was taking ASA but stopped recently due to BRBPR, not on anticoagulation.  No chest pain or shortness of breath.  Patient Active Problem List   Diagnosis Date Noted  . Coronary artery disease 10/25/2017  . Aneurysm of thoracic aorta ([place]CCC[place]2/19/2019  . 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[place]CC[place]0/04/2017  . BRBPR (bright red blood per rectum) 12/07/2016  . History of colonoscopy with polypectomy 12/07/2016  . Medication monitoring encounter 12/07/2016  . Fatty liver 09/11/2016  . Aneurysm of aorta ([place]CC[place]2/01/2017  . 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 Tinea pedis 06/25/2016  . Closed intertrochanteric fracture of left hip, sequela 05/30/2016  . Hx of pulmonary embolus 05/18/2016  . Essential hypertension, benign 12/23/2015  . Hyperlipidemia 12/23/2015  . Hx of tobacco use, presenting hazards to health 12/23/2015  . Morbid obesity ([place]CC[place]amm[place]5/19/2017  . Type 2 diabetes mellitus with diabetic nephropathy, 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HCCRoy6/21/2016    Past Surgical History:  Procedure Laterality Date  . COLONOSCOPY WITH PROPOFOL N/A 01/15/2017   Procedure: COLONOSCOPY WITH PROPOFOL;  Surgeon: AnnJonathon BellowsD;  [facility];  Service: Endoscopy;  Laterality: N/A;  . EYE SURGERY    . INTRAMEDULLARY (IM) NAIL INTERTROCHANTERIC Left 04/14/2016   Procedure: INTRAMEDULLARY (IM) NAIL INTERTROCHANTRIC;  Surgeon: JohCorky MullD;  Location:  [facility] ORS;  Service: Orthopedics;  Laterality: Left;  . JOINT REPLACEMENT Left 04/2016  . TOTAL HIP ARTHROPLASTY      Family History  Problem Relation Age of Onset  . Cancer Mother   . Diabetes Mother   . Heart disease Father   . Alcohol abuse Brother     Social History   Socioeconomic History  . Marital status: Married    Spouse name: Not on file  . Number of children: Not on  file  . Years of education: Not on file  . Highest education level: Not on file  Occupational History  . Not on file  Social Needs  . Financial resource strain: Not very hard  . Food insecurity:    Worry: Never true    Inability: Never true  . Transportation needs:    Medical: No    Non-medical: No  Tobacco Use  . Smoking status: Former Smoker    Types: Cigarettes    Last attempt to quit: 09/01/1978    Years since quitting: 40.3  . Smokeless tobacco: Never Used  Substance and Sexual Activity  . Alcohol use: No    Alcohol/week: 0.0 standard drinks  . Drug use: No  . Sexual activity: Not Currently  Lifestyle  . Physical activity:    Days per week: 5 days    Minutes per session: 20 min  . Stress: Not at all  Relationships  . Social connections:    Talks on phone: More than three times a week    Gets together: Three times a week    Attends religious service: More than 4 times per year    Active member of club or organization: No    Attends meetings of clubs or organizations: Never    Relationship status: Married  . Intimate partner violence:    Fear of current or ex partner: No    Emotionally abused: No    Physically abused: No    Forced sexual activity: No  Other Topics Concern  . Not on file  Social History Narrative  . Not on file     Current Outpatient Medications:  .  acetaminophen (TYLENOL) 500 MG tablet, Take 500 mg by mouth every 6 (six) hours as needed., Disp: , Rfl:  .  amLODipine (NORVASC) 10 MG tablet, Take 1 tablet (10 mg total) by mouth daily., Disp: 30 tablet, Rfl: 11 .  aspirin EC 81 MG tablet, Take 81 mg by mouth daily., Disp: , Rfl:  .  atorvastatin (LIPITOR) 40 MG tablet, Take 1 tablet (40 mg total) by mouth at bedtime., Disp: 90 tablet, Rfl: 3 .  Blood Glucose Monitoring Suppl (ONE TOUCH ULTRA 2) w/Device KIT, Check fingerstick blood sugars three times a day; DX E11.65, LON 99 months, Disp: 1 each, Rfl: 0 .  cholecalciferol (VITAMIN D) 1000 units  tablet, Take 1,000 Units by mouth 2 (two) times daily. , Disp: , Rfl:  .  Fish Oil-Cholecalciferol (FISH OIL + D3) 1200-1000 MG-UNIT CAPS, Take 1,200 mg by mouth daily., Disp: , Rfl:  .  fluticasone (FLONASE) 50 MCG/ACT nasal spray, Place 2 sprays into both nostrils daily. (Patient taking differently: Place 2 sprays into both nostrils daily as needed. ), Disp: 16 g, Rfl: 6 .  glucose blood (ONE TOUCH ULTRA TEST) test strip, USE AS INSTRUCTEDDX E11.65, LON 99 MONTHS CHECK FINGERSTICK BLOOD SUGARS THREE TIMES A DAY, Disp: 300 each, Rfl: 11 .  hydrocortisone (ANUSOL-HC) 25 MG suppository, Place 1 suppository (25 mg total) rectally 2 (  two) times daily., Disp: 14 suppository, Rfl: 0 .  Insulin Glargine (LANTUS) 100 UNIT/ML Solostar Pen, INJECT 60 UNITS INTO THE SKIN DAILY. OR AS DIRECTED BY YOUR DOCTOR, Disp: 5 pen, Rfl: 5 .  insulin lispro (HUMALOG KWIKPEN) 100 UNIT/ML KwikPen, Inject 0.1 mLs (10 Units total) into the skin 3 (three) times daily., Disp: 15 mL, Rfl: 11 .  Insulin Pen Needle (B-D UF III MINI PEN NEEDLES) 31G X 5 MM MISC, USE ONCE A DAY WITH LONG-ACTING INSULIN, AND UP TO 3X A DAY WITH SHORT-ACTING INSULIN, Disp: 300 each, Rfl: 3 .  JARDIANCE 25 MG TABS tablet, TAKE 1 TABLET BY MOUTH DAILY. (HIGHER DOSE), Disp: 90 tablet, Rfl: 3 .  Lancets (ONETOUCH ULTRASOFT) lancets, Check fingerstick blood sugar three times a day; E11.65, LON 99 months, Disp: 100 each, Rfl: 12 .  losartan (COZAAR) 100 MG tablet, TAKE 1 TABLET (100 MG TOTAL) BY MOUTH DAILY., Disp: 90 tablet, Rfl: 2  No Known Allergies  I personally reviewed active problem list, medication list, allergies, health maintenance, notes from last encounter, lab results with the patient/caregiver today.   ROS  Constitutional: Negative for fever or weight change.  Respiratory: Negative for cough and shortness of breath.   Cardiovascular: Negative for chest pain or palpitations.  Gastrointestinal: Negative for abdominal pain, no bowel  changes.  Musculoskeletal: Negative for gait problem or joint swelling.  Skin: Negative for rash.  Neurological: Negative for dizziness or headache.  No other specific complaints in a complete review of systems (except as listed in HPI above).  Objective  Vitals:   12/11/18 0937  BP: 118/82  Pulse: 100  Resp: 14  Temp: 98.3 F (36.8 C)  TempSrc: Oral  SpO2: 98%  Weight: 281 lb 4.8 oz (127.6 kg)  Height: 6' (1.829 m)   Body mass index is 38.15 kg/m.  Physical Exam Constitutional: Patient appears well-developed and well-nourished. No distress.  HENT: Head: Normocephalic and atraumatic. Eyes: Conjunctivae and EOM are normal. No scleral icterus. Neck: Normal range of motion. Neck supple. No JVD present. No thyromegaly present.  Cardiovascular: Normal rate, regular rhythm and normal heart sounds.  No murmur heard. No BLE edema. Pulmonary/Chest: Effort normal and breath sounds normal. No respiratory distress. Abdominal: Soft. Bowel sounds are normal, no distension. There is no tenderness. No masses. Rectal examination is deferred as he has improvement of symptoms and same concerns as last visit. Musculoskeletal: Normal range of motion, no joint effusions. No gross deformities Neurological: Pt is alert and oriented to person, place, and time. No cranial nerve deficit. Coordination, balance, strength, speech and gait are normal.  Skin: Skin is warm and dry. No rash noted. No erythema.  Psychiatric: Patient has a normal mood and affect. behavior is normal. Judgment and thought content normal.  No results found for this or any previous visit (from the past 72 hour(s)).  PHQ2/9: Depression screen [facility] 2/9 12/02/2018 10/17/2018 09/01/2018 05/29/2018 02/26/2018  Decreased Interest 0 0 0 0 0  Down, Depressed, Hopeless 0 0 0 0 0  PHQ - 2 Score 0 0 0 0 0  Altered sleeping 0 - 0 0 -  Tired, decreased energy 0 - 0 0 -  Change in appetite 0 - 0 0 -  Feeling bad or failure about yourself  0 - 0 0  -  Trouble concentrating 0 - 0 0 -  Moving slowly or fidgety/restless 0 - 0 0 -  Suicidal thoughts 0 - 0 0 -  PHQ-9 Score 0 - 0  0 -  Difficult doing work/chores Not difficult at all - Not difficult at all Not difficult at all -   PHQ-2/9 Result is negative.    Fall Risk: Fall Risk  12/02/2018 10/17/2018 09/01/2018 05/29/2018 02/26/2018  Falls in the past year? 0 0 0 No No  Number falls in past yr: - 0 0 - -  Injury with Fall? - 0 0 - -  Follow up - Falls prevention discussed - - -    Assessment & Plan  1. BRBPR (bright red blood per rectum) - Keep follow up with GI, may remain off of ASA until cleared by GI.  Monitor for significant bleeding and RTC if this occurs prior to GI visit.  2. Essential hypertension, benign - Stable today, continue medications, DASH diet  3. Type 2 diabetes mellitus with diabetic nephropathy, 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[place]) - Does not want to increase insulin, he is 72yo, and we will allow some permissively elevated A1C at 7.2% - really encouraged him to work on diet and start walking as tolerated again.  4. 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HCC) - on ARB  5. Morbid obesity ([place]) - Discussed importance of 150 minutes of physical activity weekly, eat two servings of fish weekly, eat one serving of tree nuts ( cashews, pistachios, pecans, almonds.Marland Kitchen) every other day, eat 6 servings of fruit/vegetables daily and drink plenty of water and avoid sweet beverages.   6. Mixed hyperlipidemia - Statin therapy, recommend restarting ASA once cleared by GI

## 2018-12-18 ENCOUNTER — Telehealth (INDEPENDENT_AMBULATORY_CARE_PROVIDER_SITE_OTHER): Payer: Self-pay | Admitting: Nurse Practitioner

## 2018-12-18 ENCOUNTER — Other Ambulatory Visit (INDEPENDENT_AMBULATORY_CARE_PROVIDER_SITE_OTHER): Payer: Self-pay | Admitting: Vascular Surgery

## 2018-12-18 NOTE — Telephone Encounter (Signed)
Melida with [facility] radiology dept called and stated that she had attempted to contact patient to schedule CT but patient was confused and stated that we were supposed to do the CT here in our office. Melida just wanted clarification and to make sure she was supposed to schedule him. I advised her yes, she is to schedule the CT for there because we dont do CTs in our building. Also, I called patient and left him a message advising him that the CT was ordered by our office but to be performed at [facility] and if he had any questions to call us back.; AS, CMA

## 2018-12-19 ENCOUNTER — Telehealth (INDEPENDENT_AMBULATORY_CARE_PROVIDER_SITE_OTHER): Payer: Self-pay | Admitting: Vascular Surgery

## 2018-12-26 ENCOUNTER — Ambulatory Visit: Payer: Medicare Other

## 2019-01-02 ENCOUNTER — Telehealth (INDEPENDENT_AMBULATORY_CARE_PROVIDER_SITE_OTHER): Payer: Self-pay | Admitting: Vascular Surgery

## 2019-01-02 ENCOUNTER — Encounter (INDEPENDENT_AMBULATORY_CARE_PROVIDER_SITE_OTHER): Payer: Self-pay

## 2019-01-02 ENCOUNTER — Ambulatory Visit
Admission: RE | Admit: 2019-01-02 | Discharge: 2019-01-02 | Disposition: A | Discharge status: Home / Self Care | Payer: Medicare Other | Source: Ambulatory Visit | Attending: Vascular Surgery | Admitting: Vascular Surgery

## 2019-01-02 ENCOUNTER — Other Ambulatory Visit: Payer: Self-pay

## 2019-01-02 ENCOUNTER — Ambulatory Visit (INDEPENDENT_AMBULATORY_CARE_PROVIDER_SITE_OTHER): Payer: Medicare Other | Admitting: Vascular Surgery

## 2019-01-02 DIAGNOSIS — I712 Thoracic aortic aneurysm, without rupture, unspecified: Secondary | ICD-10-CM

## 2019-01-02 LAB — POCT I-STAT CREATININE: Creatinine, Ser: 1 mg/dL (ref 0.61–1.24)

## 2019-01-02 MED ORDER — IOHEXOL 350 MG/ML SOLN
75.0000 mL | Freq: Once | INTRAVENOUS | Status: AC | PRN
  Administered 2019-01-02: 12:00:00 75 mL via INTRAVENOUS

## 2019-01-02 NOTE — Telephone Encounter (Signed)
Saw his CT. The ascending aortic aneurysm is stable and only 4.3 cm. We can just call him and schedule him for a visit and a CT scan in a year.

## 2019-01-02 NOTE — Telephone Encounter (Signed)
I reached out to the patient to advised  Him of Dr. Bunnie Domino direction

## 2019-01-06 ENCOUNTER — Ambulatory Visit (INDEPENDENT_AMBULATORY_CARE_PROVIDER_SITE_OTHER): Payer: Medicare Other | Admitting: Vascular Surgery

## 2019-01-18 ENCOUNTER — Other Ambulatory Visit: Payer: Self-pay | Admitting: Family Medicine

## 2019-01-29 ENCOUNTER — Ambulatory Visit (INDEPENDENT_AMBULATORY_CARE_PROVIDER_SITE_OTHER): Payer: Medicare Other | Admitting: Gastroenterology

## 2019-01-29 ENCOUNTER — Other Ambulatory Visit: Payer: Self-pay

## 2019-01-29 ENCOUNTER — Encounter: Payer: Self-pay | Admitting: Gastroenterology

## 2019-01-29 VITALS — BP 120/72 | HR 76 | Temp 98.6°F | Resp 17 | Ht 72.0 in | Wt 285.2 lb

## 2019-01-29 DIAGNOSIS — K625 Hemorrhage of anus and rectum: Secondary | ICD-10-CM

## 2019-01-29 DIAGNOSIS — K648 Other hemorrhoids: Secondary | ICD-10-CM | POA: Diagnosis not present

## 2019-01-29 NOTE — Progress Notes (Signed)

## 2019-01-29 NOTE — Progress Notes (Signed)
Edgar Darby, MD [address]  [place]  [place], [place] [address]  Main: [phone]  Fax: [phone]    Gastroenterology Consultation  Referring Provider:     Fredderick Severance, NP Primary Care Physician:  Fredderick Severance, NP Primary Gastroenterologist:  Dr. Vicente Males Reason for Consultation:     Bleeding internal hemorrhoids        HPI:   Edgar [name] is a 73 y.o. male referred by Dr. Fredderick Severance, NP  for consultation & management of rectal bleeding from internal hemorrhoids Patient reports that he has been experiencing rectal bleeding for 3 to 4 months.  Every time he wipes there is small amount of bright red blood on the tissue paper.  Couple of months ago, he had an episode of severe bleeding where it was spontaneous, occurred while he was sitting and watching TV he noticed that his underwear was soiled with significant amount of blood.  He tried Anusol suppositories which minimized the bleeding but continues to have small amount.  Patient underwent colonoscopy by Dr. Vicente Males in 2018, 6 polyps were removed.  He is also found to have internal hemorrhoids at that time.  He denies diarrhea but endorses intermittent constipation for which he tries to drink lots of water and tries not to strain.  He denies any other GI symptoms today  NSAIDs: None  Antiplts/Anticoagulants/Anti thrombotics: None  GI Procedures:  Colonoscopy 6-12 2018 by Dr. Vicente Males - Two 3 to 5 mm polyps in the cecum, removed with a cold biopsy forceps. Resected and retrieved. - Two 6 to 7 mm polyps in the sigmoid colon and in the descending colon, removed with a cold snare. Resected and retrieved. - One 5 mm polyp in the ascending colon, removed with a cold biopsy forceps. Resected and retrieved. - One 12 mm polyp in the transverse colon, removed with mucosal resection. Resected and retrieved. Clips were placed. - Diverticulosis in the sigmoid colon. - Non-bleeding internal hemorrhoids. -  Mucosal resection was performed. Resection and retrieval were complete.  DIAGNOSIS:  A. COLON POLYP 2, CECUM; COLD BIOPSY:  - TUBULAR ADENOMAS (2).  - NEGATIVE FOR HIGH-GRADE DYSPLASIA AND MALIGNANCY.   B. COLON POLYP, ASCENDING; COLD BIOPSY:  - TUBULAR ADENOMA.  - NEGATIVE FOR HIGH-GRADE DYSPLASIA AND MALIGNANCY.   C. COLON POLYP, TRANSVERSE, AFTER METHYLENE BLUE; HOT SNARE:  - TUBULAR ADENOMA.  - NEGATIVE FOR HIGH-GRADE DYSPLASIA AND MALIGNANCY.   D. COLON POLYP, DESCENDING AND SIGMOID; COLD SNARE:  - TUBULAR ADENOMAS (2).  - NEGATIVE FOR HIGH-GRADE DYSPLASIA AND MALIGNANCY.   Past Medical History:  Diagnosis Date  . Aneurysm of aorta ([place]) 09/11/2016   Noted on CT scan Sept 2017  . 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 Coronary artery disease 10/25/2017   CT scan March 2019  . Diabetes mellitus without complication ([place])   . Essential hypertension, benign 12/23/2015  . History of colonoscopy with polypectomy 12/07/2016   Polypectomy x 12 per patient  . Hx of tobacco use, presenting hazards to health 12/23/2015   Quit prior to 1997  . Hyperlipidemia 12/23/2015  . Hypertension   . 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 Obesity 12/23/2015  . Pulmonary embolism ([place]) 05/18/2016   Hospitalization Sept 2017, following hip fracture    Past Surgical History:  Procedure Laterality Date  . COLONOSCOPY WITH PROPOFOL N/A 01/15/2017   Procedure: COLONOSCOPY WITH PROPOFOL;  Surgeon: Jonathon Bellows, MD;  Location: [place];  Service: Endoscopy;  Laterality: N/A;  . EYE SURGERY    . INTRAMEDULLARY (IM) NAIL INTERTROCHANTERIC Left 04/14/2016   Procedure: INTRAMEDULLARY (IM) NAIL INTERTROCHANTRIC;  Surgeon: Corky Mull, MD;  Location: [facility] ORS;  Service: Orthopedics;  Laterality: Left;  . JOINT REPLACEMENT Left 04/2016  . TOTAL HIP ARTHROPLASTY     Current Outpatient Medications:  .  acetaminophen (TYLENOL) 500 MG tablet, Take 500 mg by mouth every 6  (six) hours as needed., Disp: , Rfl:  .  amLODipine (NORVASC) 10 MG tablet, Take 1 tablet (10 mg total) by mouth daily., Disp: 30 tablet, Rfl: 11 .  atorvastatin (LIPITOR) 40 MG tablet, Take 1 tablet (40 mg total) by mouth at bedtime., Disp: 90 tablet, Rfl: 3 .  Blood Glucose Monitoring Suppl (ONE TOUCH ULTRA 2) w/Device KIT, Check fingerstick blood sugars three times a day; DX E11.65, LON 99 months, Disp: 1 each, Rfl: 0 .  cholecalciferol (VITAMIN D) 1000 units tablet, Take 1,000 Units by mouth 2 (two) times daily. , Disp: , Rfl:  .  Fish Oil-Cholecalciferol (FISH OIL + D3) 1200-1000 MG-UNIT CAPS, Take 1,200 mg by mouth daily., Disp: , Rfl:  .  fluticasone (FLONASE) 50 MCG/ACT nasal spray, Place 2 sprays into both nostrils daily. (Patient taking differently: Place 2 sprays into both nostrils daily as needed. ), Disp: 16 g, Rfl: 6 .  glucose blood (ONE TOUCH ULTRA TEST) test strip, USE AS INSTRUCTEDDX E11.65, LON 99 MONTHS CHECK FINGERSTICK BLOOD SUGARS THREE TIMES A DAY, Disp: 300 each, Rfl: 11 .  Insulin Glargine (LANTUS) 100 UNIT/ML Solostar Pen, INJECT 60 UNITS INTO THE SKIN DAILY. OR AS DIRECTED BY YOUR DOCTOR, Disp: 5 pen, Rfl: 5 .  insulin lispro (HUMALOG KWIKPEN) 100 UNIT/ML KwikPen, Inject 0.1 mLs (10 Units total) into the skin 3 (three) times daily., Disp: 15 mL, Rfl: 11 .  Insulin Pen Needle (B-D UF III MINI PEN NEEDLES) 31G X 5 MM MISC, USE ONCE A DAY WITH LONG-ACTING INSULIN, AND UP TO 3X A DAY WITH SHORT-ACTING INSULIN, Disp: 300 each, Rfl: 3 .  JARDIANCE 25 MG TABS tablet, TAKE 1 TABLET BY MOUTH DAILY. (HIGHER DOSE), Disp: 90 tablet, Rfl: 3 .  Lancets (ONETOUCH ULTRASOFT) lancets, Check fingerstick blood sugar three times a day; E11.65, LON 99 months, Disp: 100 each, Rfl: 12 .  losartan (COZAAR) 100 MG tablet, TAKE 1 TABLET (100 MG TOTAL) BY MOUTH DAILY., Disp: 90 tablet, Rfl: 2    Family History  Problem Relation Age of Onset  . Cancer Mother   . Diabetes Mother   . Heart disease  Father   . Alcohol abuse Brother      Social History   Tobacco Use  . Smoking status: Former Smoker    Types: Cigarettes    Quit date: 09/01/1978    Years since quitting: 40.4  . Smokeless tobacco: Never Used  Substance Use Topics  . Alcohol use: No    Alcohol/week: 0.0 standard drinks  . Drug use: No    Allergies as of 01/29/2019  . (No Known Allergies)    Review of Systems:    All systems reviewed and negative except where noted in HPI.   Physical Exam:  BP 120/72 (BP Location: Left Arm, Patient Position: Sitting, Cuff Size: Large)   Pulse 76   Temp 98.6 F (37 C)   Resp 17   Ht 6' (1.829 m)   Wt 285 lb 3.2 oz (129.4 kg)   BMI 38.68 kg/m  No LMP for  male patient.  General:   Alert,  Well-developed, well-nourished, pleasant and cooperative in NAD Head:  Normocephalic and atraumatic. Eyes:  Sclera clear, no icterus.   Conjunctiva pink. Ears:  Normal auditory acuity. Nose:  No deformity, discharge, or lesions. Mouth:  No deformity or lesions,oropharynx pink & moist. Neck:  Supple; no masses or thyromegaly. Lungs:  Respirations even and unlabored.  Clear throughout to auscultation.   No wheezes, crackles, or rhonchi. No acute distress. Heart:  Regular rate and rhythm; no murmurs, clicks, rubs, or gallops. Abdomen:  Normal bowel sounds. Soft, non-tender and non-distended without masses, hepatosplenomegaly or hernias noted.  No guarding or rebound tenderness.   Rectal: Normal perianal exam, no skin tags.  Anoscopy revealed internal hemorrhoids only.  There is no evidence of anal fissure Msk:  Symmetrical without gross deformities. Good, equal movement & strength bilaterally. Pulses:  Normal pulses noted. Extremities:  No clubbing or edema.  No cyanosis. Neurologic:  Alert and oriented x3;  grossly normal neurologically. Skin:  Intact without significant lesions or rashes. No jaundice. Lymph Nodes:  No significant cervical adenopathy. Psych:  Alert and cooperative.  Normal mood and affect.  Imaging Studies: Reviewed  Assessment and Plan:   ELICEO GLADU is a 73 y.o. male with metabolic syndrome, multiple tubular adenomas of the colon, diverticulosis, rectal bleeding from internal hemorrhoids  I discussed with him about risks and benefits of outpatient hemorrhoid ligation He would like to proceed with hemorrhoid ligation Consent obtained Perform hemorrhoid ligation today   Follow up in 2 weeks   Edgar Darby, MD

## 2019-02-09 ENCOUNTER — Other Ambulatory Visit: Payer: Self-pay

## 2019-02-09 ENCOUNTER — Encounter: Payer: Self-pay | Admitting: Gastroenterology

## 2019-02-09 ENCOUNTER — Ambulatory Visit (INDEPENDENT_AMBULATORY_CARE_PROVIDER_SITE_OTHER): Payer: Medicare Other | Admitting: Gastroenterology

## 2019-02-09 VITALS — BP 119/70 | HR 71 | Temp 98.5°F | Resp 17 | Ht 72.0 in | Wt 283.4 lb

## 2019-02-09 DIAGNOSIS — K640 First degree hemorrhoids: Secondary | ICD-10-CM

## 2019-02-09 NOTE — Progress Notes (Signed)

## 2019-02-23 ENCOUNTER — Other Ambulatory Visit: Payer: Self-pay

## 2019-02-23 ENCOUNTER — Ambulatory Visit (INDEPENDENT_AMBULATORY_CARE_PROVIDER_SITE_OTHER): Payer: Medicare Other | Admitting: Gastroenterology

## 2019-02-23 ENCOUNTER — Encounter: Payer: Self-pay | Admitting: Gastroenterology

## 2019-02-23 VITALS — BP 120/74 | HR 77 | Temp 98.2°F | Resp 17 | Ht 72.0 in | Wt 282.6 lb

## 2019-02-23 DIAGNOSIS — K640 First degree hemorrhoids: Secondary | ICD-10-CM | POA: Diagnosis not present

## 2019-02-23 NOTE — Progress Notes (Signed)

## 2019-04-01 ENCOUNTER — Other Ambulatory Visit: Payer: Self-pay | Admitting: Family Medicine

## 2019-04-14 ENCOUNTER — Other Ambulatory Visit: Payer: Self-pay

## 2019-04-14 ENCOUNTER — Ambulatory Visit: Payer: Medicare Other | Admitting: Family Medicine

## 2019-04-14 ENCOUNTER — Encounter: Payer: Self-pay | Admitting: Family Medicine

## 2019-04-14 VITALS — BP 118/60 | HR 80 | Temp 96.8°F | Resp 14 | Ht 72.0 in | Wt 275.5 lb

## 2019-04-14 DIAGNOSIS — Z794 Long term (current) use of insulin: Secondary | ICD-10-CM

## 2019-04-14 DIAGNOSIS — E1121 Type 2 diabetes mellitus with diabetic nephropathy: Secondary | ICD-10-CM

## 2019-04-14 DIAGNOSIS — I10 Essential (primary) hypertension: Secondary | ICD-10-CM

## 2019-04-14 DIAGNOSIS — Z23 Encounter for immunization: Secondary | ICD-10-CM

## 2019-04-14 DIAGNOSIS — E782 Mixed hyperlipidemia: Secondary | ICD-10-CM | POA: Diagnosis not present

## 2019-04-14 DIAGNOSIS — L602 Onychogryphosis: Secondary | ICD-10-CM

## 2019-04-14 DIAGNOSIS — I700 Atherosclerosis of aorta: Secondary | ICD-10-CM

## 2019-04-14 MED ORDER — ATORVASTATIN CALCIUM 40 MG PO TABS: 40.0000 mg | ORAL_TABLET | Freq: Every day | ORAL | 3 refills | Status: DC

## 2019-04-14 MED ORDER — LOSARTAN POTASSIUM 100 MG PO TABS: 100.0000 mg | ORAL_TABLET | Freq: Every day | ORAL | 2 refills | Status: DC

## 2019-04-14 MED ORDER — AMLODIPINE BESYLATE 10 MG PO TABS: 10.0000 mg | ORAL_TABLET | Freq: Every day | ORAL | 1 refills | Status: DC

## 2019-04-14 NOTE — Assessment & Plan Note (Signed)
Compliant with meds, no SE, no myalgias, fatigue or jaundice Due for FLP and recheck CMP Diet and exercise recommendations for HLD reviewed and handout given  

## 2019-04-14 NOTE — Assessment & Plan Note (Signed)
He's lost weight per Dr. Sanda Klein instructions Still on insulin doses and compliant and checking sugars Will recheck A1C Compliant with ARB and statin

## 2019-04-14 NOTE — Progress Notes (Addendum)
Name: Edgar [name]   MRN: [id number]    DOB: 04-16-1946   Date:04/16/2019       Progress Note  Chief Complaint  Patient presents with  . Follow-up  . Diabetes  . Hyperlipidemia  . Hypertension     Subjective:   Edgar [name] is a 73 y.o. male, presents to clinic for routine follow up on the conditions listed above.  Diabetes Mellitus Type II:  Insulin dependent DM dx 10+ years ago Currently managing with insulin (humalog and lantus), jardiance 10 units TID humalog and 60 units lantus at bedtime Pt notes good med compliance - he takes what he's told Pt has no SE from meds. Fasting CBGs typically run 100-200, He is checking 3x/d - tries to always check it when he's going to eat and low CBG 99.  No hypoglycemic episodes - none Denies: Polyuria, polydipsia, polyphagia, vision changes, or neuropathy  Recent pertinent labs: Lab Results  Component Value Date   HGBA1C 7.3 (H) 04/14/2019   HGBA1C 7.2 (H) 12/02/2018   HGBA1C 7.4 (H) 09/01/2018      Component Value Date/Time   NA 139 04/14/2019 0000   NA 139 01/06/2016 0918   K 4.2 04/14/2019 0000   CL 107 04/14/2019 0000   CO2 26 04/14/2019 0000   GLUCOSE 93 04/14/2019 0000   BUN 17 04/14/2019 0000   BUN 16 01/06/2016 0918   CREATININE 1.12 04/14/2019 0000   CALCIUM 10.2 04/14/2019 0000   PROT 7.5 04/14/2019 0000   PROT 7.5 12/23/2015 0959   ALBUMIN 4.0 12/07/2016 0957   ALBUMIN 3.8 01/06/2016 0918   AST 14 04/14/2019 0000   ALT 12 04/14/2019 0000   ALKPHOS 87 12/07/2016 0957   BILITOT 0.7 04/14/2019 0000   BILITOT 0.5 12/23/2015 0959   GFRNONAA 65 04/14/2019 0000   GFRAA 76 04/14/2019 0000    Current diet: well balanced - he's cut back on food, hes trying to avoid fried foods, pork chops, fried chicken Current exercise: none  UTD on DM foot exam and eye exam ACEI/ARB: Yes Statin: Yes   Hypertension:  Currently managed on norvasc, losartan Pt reports excellent med compliance and denies any SE.   He monitors  his BP at home intermittently, no highs or low Blood pressure today is well controlled. BP Readings from Last 3 Encounters:  04/14/19 118/60  02/23/19 120/74  02/09/19 119/70   Pt denies CP, SOB, exertional sx, LE edema, palpitation, Ha's, visual disturbances,  lightheadedness, hypotension, syncope. Dietary efforts for BP? Baking foods - trying to do healthy diet and he is walking  Hyperlipidemia: Current Medication Regimen:  lipitor Tolerating well w/o SE, notes excellent compliance Last Lipids: Lab Results  Component Value Date   CHOL 127 04/14/2019   HDL 34 (L) 04/14/2019   LDLCALC 78 04/14/2019   TRIG 74 04/14/2019   CHOLHDL 3.7 04/14/2019   - Current Diet:  As per above - Denies: Chest pain, shortness of breath, myalgias.  Needs flu shot today - pt willing ot get.  WEIGHT:  Lost some weight since last appt.  Wt Readings from Last 5 Encounters:  04/14/19 275 lb 8 oz (125 kg)  02/23/19 282 lb 9.6 oz (128.2 kg)  02/09/19 283 lb 6.4 oz (128.5 kg)  01/29/19 285 lb 3.2 oz (129.4 kg)  12/11/18 281 lb 4.8 oz (127.6 kg)   BMI Readings from Last 5 Encounters:  04/14/19 37.36 kg/m  02/23/19 38.33 kg/m  02/09/19 38.44 kg/m  01/29/19 38.68 kg/m  12/11/18 38.15 kg/m     Patient Active Problem List   Diagnosis Date Noted  . Coronary artery disease 10/25/2017  . 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[place]) 05/14/2017  . BRBPR (bright red blood per rectum) 12/07/2016  . History of colonoscopy with polypectomy 12/07/2016  . Medication monitoring encounter 12/07/2016  . Fatty liver 09/11/2016  . Aneurysm of aorta ([place]) 09/11/2016  . 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 Tinea pedis 06/25/2016  . Closed intertrochanteric fracture of left hip, sequela 05/30/2016  . Hx of pulmonary embolus 05/18/2016  . Essential hypertension, benign 12/23/2015  . Hyperlipidemia 12/23/2015  . Hx of tobacco use, presenting hazards to  health 12/23/2015  . Morbid obesity ([place]) 12/23/2015  . Type 2 diabetes mellitus with diabetic nephropathy, 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[place]) 01/25/2015    Past Surgical History:  Procedure Laterality Date  . COLONOSCOPY WITH PROPOFOL N/A 01/15/2017   Procedure: COLONOSCOPY WITH PROPOFOL;  Surgeon: Jonathon Bellows, MD;  Location: [facility] ENDOSCOPY;  Service: Endoscopy;  Laterality: N/A;  . EYE SURGERY    . INTRAMEDULLARY (IM) NAIL INTERTROCHANTERIC Left 04/14/2016   Procedure: INTRAMEDULLARY (IM) NAIL INTERTROCHANTRIC;  Surgeon: Corky Mull, MD;  Location: [facility] ORS;  Service: Orthopedics;  Laterality: Left;  . JOINT REPLACEMENT Left 04/2016  . TOTAL HIP ARTHROPLASTY      Family History  Problem Relation Age of Onset  . Cancer Mother   . Diabetes Mother   . Heart disease Father   . Alcohol abuse Brother     Social History   Socioeconomic History  . Marital status: Married    Spouse name: Not on file  . Number of children: Not on file  . Years of education: Not on file  . Highest education level: Not on file  Occupational History  . Not on file  Social Needs  . Financial resource strain: Not very hard  . Food insecurity    Worry: Never true    Inability: Never true  . Transportation needs    Medical: No    Non-medical: No  Tobacco Use  . Smoking status: Former Smoker    Types: Cigarettes    Quit date: 09/01/1978    Years since quitting: 40.6  . Smokeless tobacco: Never Used  Substance and Sexual Activity  . Alcohol use: No    Alcohol/week: 0.0 standard drinks  . Drug use: No  . Sexual activity: Not Currently  Lifestyle  . Physical activity    Days per week: 5 days    Minutes per session: 20 min  . Stress: Not at all  Relationships  . Social connections    Talks on phone: More than three times a week    Gets together: Three times a week    Attends religious service: More than 4 times per year    Active member of club or organization: No    Attends meetings  of clubs or organizations: Never    Relationship status: Married  . Intimate partner violence    Fear of current or ex partner: No    Emotionally abused: No    Physically abused: No    Forced sexual activity: No  Other Topics Concern  . Not on file  Social History Narrative  . Not on file     Current Outpatient Medications:  .  acetaminophen (TYLENOL) 500 MG tablet, Take 500 mg by mouth every 6 (six) hours as needed., Disp: , Rfl:  .  amLODipine (NORVASC) 10 MG tablet, Take 1 tablet (10 mg total) by mouth daily., Disp: 90 tablet, Rfl: 1 .  atorvastatin (LIPITOR) 40 MG tablet, Take 1 tablet (40 mg total) by mouth at bedtime., Disp: 90 tablet, Rfl: 3 .  fluticasone (FLONASE) 50 MCG/ACT nasal spray, Place 2 sprays into both nostrils daily. (Patient taking differently: Place 2 sprays into both nostrils daily as needed. ), Disp: 16 g, Rfl: 6 .  glucose blood (ONE TOUCH ULTRA TEST) test strip, USE AS INSTRUCTEDDX E11.65, LON 99 MONTHS CHECK FINGERSTICK BLOOD SUGARS THREE TIMES A DAY, Disp: 300 each, Rfl: 11 .  HUMALOG KWIKPEN 100 UNIT/ML KwikPen, INJECT 0.07 MLS (7 UNITS TOTAL) INTO THE SKIN 3 (THREE) TIMES DAILY WITH MEALS. SHORT-ACTING INSULIN, Disp: 15 mL, Rfl: 3 .  Insulin Glargine (LANTUS) 100 UNIT/ML Solostar Pen, INJECT 60 UNITS INTO THE SKIN DAILY. OR AS DIRECTED BY YOUR DOCTOR, Disp: 5 pen, Rfl: 5 .  Insulin Pen Needle (B-D UF III MINI PEN NEEDLES) 31G X 5 MM MISC, USE ONCE A DAY WITH LONG-ACTING INSULIN, AND UP TO 3X A DAY WITH SHORT-ACTING INSULIN, Disp: 300 each, Rfl: 3 .  JARDIANCE 25 MG TABS tablet, TAKE 1 TABLET BY MOUTH DAILY. (HIGHER DOSE), Disp: 90 tablet, Rfl: 3 .  Lancets (ONETOUCH ULTRASOFT) lancets, Check fingerstick blood sugar three times a day; E11.65, LON 99 months, Disp: 100 each, Rfl: 12 .  losartan (COZAAR) 100 MG tablet, Take 1 tablet (100 mg total) by mouth daily., Disp: 90 tablet, Rfl: 2 .  Blood Glucose Monitoring Suppl (ONE TOUCH ULTRA 2) w/Device KIT, Check  fingerstick blood sugars three times a day; DX E11.65, LON 99 months, Disp: 1 each, Rfl: 0 .  cholecalciferol (VITAMIN D) 1000 units tablet, Take 1,000 Units by mouth 2 (two) times daily. , Disp: , Rfl:  .  Fish Oil-Cholecalciferol (FISH OIL + D3) 1200-1000 MG-UNIT CAPS, Take 1,200 mg by mouth daily., Disp: 90 capsule, Rfl: 3  No Known Allergies  I personally reviewed active problem list, medication list, allergies, family history, social history, health maintenance, notes from last encounter, lab results with the patient/caregiver today.  Review of Systems  Constitutional: Negative.   HENT: Negative.   Eyes: Negative.   Respiratory: Negative.   Cardiovascular: Negative.   Gastrointestinal: Negative.   Endocrine: Negative.   Genitourinary: Negative.   Musculoskeletal: Negative.   Skin: Negative.   Allergic/Immunologic: Negative.   Neurological: Negative.   Hematological: Negative.   Psychiatric/Behavioral: Negative.   All other systems reviewed and are negative.    Objective:    Vitals:   04/14/19 1146  BP: 118/60  Pulse: 80  Resp: 14  Temp: (!) 96.8 F (36 C)  Weight: 275 lb 8 oz (125 kg)  Height: 6' (1.829 m)    Body mass index is 37.36 kg/m.  Physical Exam Constitutional:      General: He is not in acute distress.    Appearance: Normal appearance. He is well-developed. He is not ill-appearing or toxic-appearing.  HENT:     Head: Normocephalic and atraumatic.     Jaw: No trismus.     Right Ear: Tympanic membrane, ear canal and external ear normal.     Left Ear: Tympanic membrane, ear canal and external ear normal.     Nose: No mucosal edema or rhinorrhea.     Right Sinus: No maxillary sinus tenderness or frontal sinus tenderness.     Left Sinus: No maxillary sinus tenderness or frontal sinus tenderness.  Mouth/Throat:     Pharynx: Uvula midline. No oropharyngeal exudate, posterior oropharyngeal erythema or uvula swelling.  Eyes:     General: Lids are  normal.     Conjunctiva/sclera: Conjunctivae normal.     Pupils: Pupils are equal, round, and reactive to light.  Neck:     Musculoskeletal: Normal range of motion and neck supple.     Trachea: Trachea and phonation normal. No tracheal deviation.  Cardiovascular:     Rate and Rhythm: Regular rhythm.     Pulses: Normal pulses.          Radial pulses are 2+ on the right side and 2+ on the left side.       Posterior tibial pulses are 2+ on the right side and 2+ on the left side.     Heart sounds: Normal heart sounds. No murmur. No friction rub. No gallop.   Pulmonary:     Effort: Pulmonary effort is normal.     Breath sounds: Normal breath sounds. No wheezing, rhonchi or rales.  Abdominal:     General: Bowel sounds are normal. There is no distension.     Palpations: Abdomen is soft.     Tenderness: There is no abdominal tenderness. There is no guarding or rebound.  Musculoskeletal: Normal range of motion.  Skin:    General: Skin is warm and dry.     Capillary Refill: Capillary refill takes less than 2 seconds.     Findings: No rash.  Neurological:     Mental Status: He is alert and oriented to person, place, and time.     Gait: Gait normal.  Psychiatric:        Speech: Speech normal.        Behavior: Behavior normal.       PHQ2/9: Depression screen [facility] 2/9 04/14/2019 12/11/2018 12/02/2018 10/17/2018 09/01/2018  Decreased Interest 0 0 0 0 0  Down, Depressed, Hopeless 0 0 0 0 0  PHQ - 2 Score 0 0 0 0 0  Altered sleeping 0 0 0 - 0  Tired, decreased energy 0 0 0 - 0  Change in appetite 0 0 0 - 0  Feeling bad or failure about yourself  0 0 0 - 0  Trouble concentrating 0 0 0 - 0  Moving slowly or fidgety/restless 0 0 0 - 0  Suicidal thoughts 0 0 0 - 0  PHQ-9 Score 0 0 0 - 0  Difficult doing work/chores Not difficult at all Not difficult at all Not difficult at all - Not difficult at all  Some recent data might be hidden    phq 9 is negative Reviewed by me today  Fall Risk: Fall  Risk  04/14/2019 12/11/2018 12/02/2018 10/17/2018 09/01/2018  Falls in the past year? 0 0 0 0 0  Number falls in past yr: 0 0 - 0 0  Injury with Fall? 0 0 - 0 0  Follow up - - - Falls prevention discussed -     Assessment & Plan:   Problem List Items Addressed This Visit      Cardiovascular and Mediastinum   Essential hypertension, benign (Chronic)    Well controlled, compliant with meds, no SE or concerning sx Recheck renal function and electrolytes Healthy diet (low salt) and lifestyle for improving HTN discussed and handout given      Relevant Medications   atorvastatin (LIPITOR) 40 MG tablet   losartan (COZAAR) 100 MG tablet   amLODipine (NORVASC) 10 MG tablet   Other  Relevant Orders   COMPLETE METABOLIC PANEL WITH GFR (Completed)     Endocrine   Type 2 diabetes mellitus with diabetic nephropathy, 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HCC)    He's lost weight per Dr. Sanda Klein instructions Still on insulin doses and compliant and checking sugars Will recheck A1C Compliant with ARB and statin      Relevant Medications   atorvastatin (LIPITOR) 40 MG tablet   losartan (COZAAR) 100 MG tablet   Other Relevant Orders   Hemoglobin A1c (Completed)   COMPLETE METABOLIC PANEL WITH GFR (Completed)   Lipid panel (Completed)   Ambulatory referral to Podiatry     Other   Hyperlipidemia (Chronic)    Compliant with meds, no SE, no myalgias, fatigue or jaundice Due for FLP and recheck CMP Diet and exercise recommendations for HLD reviewed and handout given       Relevant Medications   atorvastatin (LIPITOR) 40 MG tablet   losartan (COZAAR) 100 MG tablet   amLODipine (NORVASC) 10 MG tablet   Other Relevant Orders   Lipid panel (Completed)   Morbid obesity ([place]) (Chronic)    Education and counseling provided today on BMI and diet and exercise changes to help with weight loss Hand out given (info in AVS)      Relevant Orders   Hemoglobin A1c (Completed)   Lipid panel (Completed)     Other Visit Diagnoses    Need for influenza vaccination    -  Primary   Relevant Orders   Flu Vaccine QUAD High Dose(Fluad) (Completed)   Thick nail       referral to podiatry   Relevant Orders   Ambulatory referral to Podiatry   Aortic atherosclerosis [facility])       seeing cardiology, on statin and BP controlled   Relevant Medications   atorvastatin (LIPITOR) 40 MG tablet   losartan (COZAAR) 100 MG tablet   amLODipine (NORVASC) 10 MG tablet       Return in about 4 months (around 08/14/2019) for Routine follow-up.   Delsa Grana, PA-C 04/16/19 3:05 PM

## 2019-04-14 NOTE — Assessment & Plan Note (Signed)
Well controlled, compliant with meds, no SE or concerning sx Recheck renal function and electrolytes Healthy diet (low salt) and lifestyle for improving HTN discussed and handout given

## 2019-04-14 NOTE — Patient Instructions (Addendum)
Please follow up sooner if you are ill Contact your pharmacy if you need refills.  We will see you in 4 months to recheck everything.  You can take Tylenol [id number] mg up to three times a day as needed for pain (leg pain, arthritis pain)  Health Maintenance  Topic Date Due  . INFLUENZA VACCINE  03/07/2019  . TETANUS/TDAP  05/14/2019  . HEMOGLOBIN A1C  06/03/2019  . FOOT EXAM  09/02/2019  . OPHTHALMOLOGY EXAM  10/20/2019  . COLONOSCOPY  01/16/2020  . Hepatitis C Screening  Completed  . PNA vac Low Risk Adult  Completed    Diabetes Mellitus and Nutrition, Adult When you have diabetes (diabetes mellitus), it is very important to have healthy eating habits because your blood sugar (glucose) levels are greatly affected by what you eat and drink. Eating healthy foods in the appropriate amounts, at about the same times every day, can help you:  Control your blood glucose.  Lower your risk of heart disease.  Improve your blood pressure.  Reach or maintain a healthy weight. Every person with diabetes is different, and each person has different needs for a meal plan. Your health care provider may recommend that you work with a diet and nutrition specialist (dietitian) to make a meal plan that is best for you. Your meal plan may vary depending on factors such as:  The calories you need.  The medicines you take.  Your weight.  Your blood glucose, blood pressure, and cholesterol levels.  Your activity level.  Other health conditions you have, such as heart or kidney disease. How do carbohydrates affect me? Carbohydrates, also called carbs, affect your blood glucose level more than any other type of food. Eating carbs naturally raises the amount of glucose in your blood. Carb counting is a method for keeping track of how many carbs you eat. Counting carbs is important to keep your blood glucose at a healthy level, especially if you use insulin or take certain oral diabetes medicines. It  is important to know how many carbs you can safely have in each meal. This is different for every person. Your dietitian can help you calculate how many carbs you should have at each meal and for each snack. Foods that contain carbs include:  Bread, cereal, rice, pasta, and crackers.  Potatoes and corn.  Peas, beans, and lentils.  Milk and yogurt.  Fruit and juice.  Desserts, such as cakes, cookies, ice cream, and candy. How does alcohol affect me? Alcohol can cause a sudden decrease in blood glucose (hypoglycemia), especially if you use insulin or take certain oral diabetes medicines. Hypoglycemia can be a life-threatening condition. Symptoms of hypoglycemia (sleepiness, dizziness, and confusion) are similar to symptoms of having too much alcohol. If your health care provider says that alcohol is safe for you, follow these guidelines:  Limit alcohol intake to no more than 1 drink per day for nonpregnant women and 2 drinks per day for men. One drink equals 12 oz of beer, 5 oz of wine, or 1 oz of hard liquor.  Do not drink on an empty stomach.  Keep yourself hydrated with water, diet soda, or unsweetened iced tea.  Keep in mind that regular soda, juice, and other mixers may contain a lot of sugar and must be counted as carbs. What are tips for following this plan?  Reading food labels  Start by checking the serving size on the "Nutrition Facts" label of packaged foods and drinks. The amount of calories,  carbs, fats, and other nutrients listed on the label is based on one serving of the item. Many items contain more than one serving per package.  Check the total grams (g) of carbs in one serving. You can calculate the number of servings of carbs in one serving by dividing the total carbs by 15. For example, if a food has 30 g of total carbs, it would be equal to 2 servings of carbs.  Check the number of grams (g) of saturated and trans fats in one serving. Choose foods that have low  or no amount of these fats.  Check the number of milligrams (mg) of salt (sodium) in one serving. Most people should limit total sodium intake to less than 2,300 mg per day.  Always check the nutrition information of foods labeled as "low-fat" or "nonfat". These foods may be higher in added sugar or refined carbs and should be avoided.  Talk to your dietitian to identify your daily goals for nutrients listed on the label. Shopping  Avoid buying canned, premade, or processed foods. These foods tend to be high in fat, sodium, and added sugar.  Shop around the outside edge of the grocery store. This includes fresh fruits and vegetables, bulk grains, fresh meats, and fresh dairy. Cooking  Use low-heat cooking methods, such as baking, instead of high-heat cooking methods like deep frying.  Cook using healthy oils, such as olive, canola, or sunflower oil.  Avoid cooking with butter, cream, or high-fat meats. Meal planning  Eat meals and snacks regularly, preferably at the same times every day. Avoid going long periods of time without eating.  Eat foods high in fiber, such as fresh fruits, vegetables, beans, and whole grains. Talk to your dietitian about how many servings of carbs you can eat at each meal.  Eat 4-6 ounces (oz) of lean protein each day, such as lean meat, chicken, fish, eggs, or tofu. One oz of lean protein is equal to: ? 1 oz of meat, chicken, or fish. ? 1 egg. ?  cup of tofu.  Eat some foods each day that contain healthy fats, such as avocado, nuts, seeds, and fish. Lifestyle  Check your blood glucose regularly.  Exercise regularly as told by your health care provider. This may include: ? 150 minutes of moderate-intensity or vigorous-intensity exercise each week. This could be brisk walking, biking, or water aerobics. ? Stretching and doing strength exercises, such as yoga or weightlifting, at least 2 times a week.  Take medicines as told by your health care  provider.  Do not use any products that contain nicotine or tobacco, such as cigarettes and e-cigarettes. If you need help quitting, ask your health care provider.  Work with a Social worker or diabetes educator to identify strategies to manage stress and any emotional and social challenges. Questions to ask a health care provider  Do I need to meet with a diabetes educator?  Do I need to meet with a dietitian?  What number can I call if I have questions?  When are the best times to check my blood glucose? Where to find more information:  American Diabetes Association: diabetes.org  Academy of Nutrition and Dietetics: www.eatright.[employer] of Diabetes and Digestive and Kidney Diseases (NIH): [employer] Summary  A healthy meal plan will help you control your blood glucose and maintain a healthy lifestyle.  Working with a diet and nutrition specialist (dietitian) can help you make a meal plan that is best for you.  Keep  in mind that carbohydrates (carbs) and alcohol have immediate effects on your blood glucose levels. It is important to count carbs and to use alcohol carefully. This information is not intended to replace advice given to you by your health care provider. Make sure you discuss any questions you have with your health care provider. Document Released: 04/19/2005 Document Revised: 07/05/2017 Document Reviewed: 08/27/2016 Elsevier Patient Education  2020 [employer].

## 2019-04-15 ENCOUNTER — Other Ambulatory Visit: Payer: Self-pay | Admitting: Family Medicine

## 2019-04-15 LAB — COMPLETE METABOLIC PANEL WITH GFR
AG Ratio: 1.1 (calc) (ref 1.0–2.5)
ALT: 12 U/L (ref 9–46)
AST: 14 U/L (ref 10–35)
Albumin: 4 g/dL (ref 3.6–5.1)
Alkaline phosphatase (APISO): 68 U/L (ref 35–144)
BUN: 17 mg/dL (ref 7–25)
CO2: 26 mmol/L (ref 20–32)
Calcium: 10.2 mg/dL (ref 8.6–10.3)
Chloride: 107 mmol/L (ref 98–110)
Creat: 1.12 mg/dL (ref 0.70–1.18)
GFR, Est African American: 76 mL/min/{1.73_m2} (ref >=60)
GFR, Est Non African American: 65 mL/min/{1.73_m2} (ref >=60)
Globulin: 3.5 g/dL (calc) (ref 1.9–3.7)
Glucose, Bld: 93 mg/dL (ref 65–99)
Potassium: 4.2 mmol/L (ref 3.5–5.3)
Sodium: 139 mmol/L (ref 135–146)
Total Bilirubin: 0.7 mg/dL (ref 0.2–1.2)
Total Protein: 7.5 g/dL (ref 6.1–8.1)

## 2019-04-15 LAB — LIPID PANEL
Cholesterol: 127 mg/dL (ref <200)
HDL: 34 mg/dL — ABNORMAL LOW (ref >=40)
LDL Cholesterol (Calc): 78 mg/dL (calc)
Non-HDL Cholesterol (Calc): 93 mg/dL (calc) (ref <130)
Total CHOL/HDL Ratio: 3.7 (calc) (ref <5.0)
Triglycerides: 74 mg/dL (ref <150)

## 2019-04-15 LAB — HEMOGLOBIN A1C
Hgb A1c MFr Bld: 7.3 % of total Hgb — ABNORMAL HIGH (ref <5.7)
Mean Plasma Glucose: 163 (calc)
eAG (mmol/L): 9 (calc)

## 2019-04-15 MED ORDER — FISH OIL + D3 1200-1000 MG-UNIT PO CAPS: 1200.0000 mg | ORAL_CAPSULE | Freq: Every day | ORAL | 3 refills | Status: DC

## 2019-04-16 NOTE — Assessment & Plan Note (Signed)
Education and counseling provided today on BMI and diet and exercise changes to help with weight loss Hand out given (info in AVS)

## 2019-05-01 ENCOUNTER — Other Ambulatory Visit: Payer: Self-pay | Admitting: Family Medicine

## 2019-05-01 NOTE — Telephone Encounter (Signed)
Requested medication (s) are due for refill today: yes  Requested medication (s) are on the active medication list: yes  Last refill:  03/26/2019  Future visit scheduled: yes  Notes to clinic:  Review for refill   Requested Prescriptions  Pending Prescriptions Disp Refills   LANTUS SOLOSTAR 100 UNIT/ML Solostar Pen [Pharmacy Med Name: LANTUS SOLOSTAR 100 UNIT/ML] 15 mL 5    Sig: INJECT 60 UNITS INTO THE SKIN DAILY. OR AS DIRECTED BY YOUR DOCTOR     Endocrinology:  Diabetes - Insulins Passed - 05/01/2019 10:28 AM      Passed - HBA1C is between 0 and 7.9 and within 180 days    Hgb A1c MFr Bld  Date Value Ref Range Status  04/14/2019 7.3 (H) <5.7 % of total Hgb Final    Comment:    For someone without known diabetes, a hemoglobin A1c value of 6.5% or greater indicates that they may have  diabetes and this should be confirmed with a follow-up  test. . For someone with known diabetes, a value <7% indicates  that their diabetes is well controlled and a value  greater than or equal to 7% indicates suboptimal  control. A1c targets should be individualized based on  duration of diabetes, age, comorbid conditions, and  other considerations. . Currently, no consensus exists regarding use of hemoglobin A1c for diagnosis of diabetes for children. Renella Cunas - Valid encounter within last 6 months    Recent Outpatient Visits          2 weeks ago Need for influenza vaccination   [facility] Delsa Grana, PA-C   4 months ago BRBPR (bright red blood per rectum)   [place], FNP   4 months ago Erroneous encounter - disregard   [place], NP   5 months ago Bright red blood per rectum   [place], NP   8 months ago Type 2 diabetes mellitus with diabetic nephropathy, with long-term current use of insulin [facility])   [place], Satira Anis, MD      Future Appointments            In 3 months Delsa Grana, PA-C [facility], [place]   In 5 months  [facility], [facility]

## 2019-05-19 ENCOUNTER — Other Ambulatory Visit: Payer: Self-pay

## 2019-05-19 ENCOUNTER — Encounter: Payer: Self-pay | Admitting: Podiatry

## 2019-05-19 ENCOUNTER — Ambulatory Visit: Payer: Medicare Other | Admitting: Podiatry

## 2019-05-19 DIAGNOSIS — B351 Tinea unguium: Secondary | ICD-10-CM | POA: Diagnosis not present

## 2019-05-19 DIAGNOSIS — M79676 Pain in unspecified toe(s): Secondary | ICD-10-CM | POA: Diagnosis not present

## 2019-05-19 DIAGNOSIS — E0843 Diabetes mellitus due to underlying condition with diabetic autonomic (poly)neuropathy: Secondary | ICD-10-CM

## 2019-05-24 NOTE — Progress Notes (Signed)
   SUBJECTIVE Patient with a history of diabetes mellitus presents to office today complaining of elongated, thickened nails that cause pain while ambulating in shoes. He is unable to trim his own nails. Patient is here for further evaluation and treatment.   Past Medical History:  Diagnosis Date  . 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 Coronary artery disease 10/25/2017   CT scan March 2019  . Diabetes mellitus without complication ([place])   . Essential hypertension, benign 12/23/2015  . History of colonoscopy with polypectomy 12/07/2016   Polypectomy x 12 per patient  . Hx of tobacco use, presenting hazards to health 12/23/2015   Quit prior to 1997  . Hyperlipidemia 12/23/2015  . Hypertension   . 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 Obesity 12/23/2015  . Pulmonary embolism ([place]) 05/18/2016   Hospitalization Sept 2017, following hip fracture    OBJECTIVE General Patient is awake, alert, and oriented x 3 and in no acute distress. Derm Skin is dry and supple bilateral. Negative open lesions or macerations. Remaining integument unremarkable. Nails are tender, long, thickened and dystrophic with subungual debris, consistent with onychomycosis, 1-5 bilateral. No signs of infection noted. Vasc  DP and PT pedal pulses palpable bilaterally. Temperature gradient within normal limits.  Neuro Epicritic and protective threshold sensation diminished bilaterally.  Musculoskeletal Exam No symptomatic pedal deformities noted bilateral. Muscular strength within normal limits.  ASSESSMENT 1. Diabetes Mellitus w/ peripheral neuropathy 2. Onychomycosis of nail due to dermatophyte bilateral 3. Pain in foot bilateral  PLAN OF CARE 1. Patient evaluated today. 2. Instructed to maintain good pedal hygiene and foot care. Stressed importance of controlling blood sugar.  3. Mechanical debridement of nails 1-5  bilaterally performed using a nail nipper. Filed with dremel without incident.  4. Return to clinic in 3 mos.     Edrick Kins, DPM [facility]  Dr. Edrick Kins, [place]                                        [place], [place] [address]                Office [phone]  Fax [phone]

## 2019-06-25 ENCOUNTER — Other Ambulatory Visit: Payer: Self-pay | Admitting: Family Medicine

## 2019-06-25 DIAGNOSIS — E1121 Type 2 diabetes mellitus with diabetic nephropathy: Secondary | ICD-10-CM

## 2019-06-25 DIAGNOSIS — Z794 Long term (current) use of insulin: Secondary | ICD-10-CM

## 2019-06-25 NOTE — Telephone Encounter (Signed)
Copied from [place] [id number]. Topic: Quick Communication - Rx Refill/Question >> Jun 25, 2019 12:27 PM Izola Price, [place] A wrote: Medication: Insulin Pen Needle (B-D UF III MINI PEN NEEDLES) 31G X 5 MM MISC  (3 boxes) (Pharmacy stated that fax has been sent over medication request and needs to be sent back over.)  Has the patient contacted their pharmacy? Yes (Agent: If no, request that the patient contact the pharmacy for the refill.) (Agent: If yes, when and what did the pharmacy advise?)Contact PCP  Preferred Pharmacy (with phone number or street name): [id number] [address], [place] [phone] (Phone) [phone] (Fax)    Agent: Please be advised that RX refills may take up to 3 business days. We ask that you follow-up with your pharmacy.

## 2019-06-29 MED ORDER — BD PEN NEEDLE MINI U/F 31G X 5 MM MISC: 3 refills | Status: DC

## 2019-08-14 ENCOUNTER — Ambulatory Visit (INDEPENDENT_AMBULATORY_CARE_PROVIDER_SITE_OTHER): Payer: Medicare PPO | Admitting: Family Medicine

## 2019-08-14 ENCOUNTER — Encounter: Payer: Self-pay | Admitting: Family Medicine

## 2019-08-14 ENCOUNTER — Other Ambulatory Visit: Payer: Self-pay

## 2019-08-14 VITALS — Ht 72.0 in | Wt 300.0 lb

## 2019-08-14 DIAGNOSIS — I10 Essential (primary) hypertension: Secondary | ICD-10-CM

## 2019-08-14 DIAGNOSIS — Z5181 Encounter for therapeutic drug level monitoring: Secondary | ICD-10-CM | POA: Diagnosis not present

## 2019-08-14 DIAGNOSIS — E0843 Diabetes mellitus due to underlying condition with diabetic autonomic (poly)neuropathy: Secondary | ICD-10-CM | POA: Diagnosis not present

## 2019-08-14 DIAGNOSIS — Z794 Long term (current) use of insulin: Secondary | ICD-10-CM

## 2019-08-14 DIAGNOSIS — E782 Mixed hyperlipidemia: Secondary | ICD-10-CM

## 2019-08-14 DIAGNOSIS — E1129 Type 2 diabetes mellitus with other diabetic kidney complication: Secondary | ICD-10-CM

## 2019-08-14 DIAGNOSIS — E1121 Type 2 diabetes mellitus with diabetic nephropathy: Secondary | ICD-10-CM

## 2019-08-14 DIAGNOSIS — R809 Proteinuria, unspecified: Secondary | ICD-10-CM

## 2019-08-14 DIAGNOSIS — I712 Thoracic aortic aneurysm, without rupture, unspecified: Secondary | ICD-10-CM

## 2019-08-14 DIAGNOSIS — I700 Atherosclerosis of aorta: Secondary | ICD-10-CM

## 2019-08-14 NOTE — Progress Notes (Signed)
Name: Edgar [name]   MRN: [id number]    DOB: Apr 03, 1946   Date:08/14/2019       Progress Note  Subjective:    Chief Complaint  Chief Complaint  Patient presents with  . Follow-up  . Diabetes  . Hyperlipidemia  . Hypertension  . Sinusitis    I connected with  Edgar [name] on 08/14/19 at  1:00 PM EST by telephone and verified that I am speaking with the correct person using two identifiers.  I discussed the limitations, risks, security and privacy concerns of performing an evaluation and management service by telephone and the availability of in person appointments. Staff also discussed with the patient that there may be a patient responsible charge related to this service. Patient Location: home Provider Location: [facility] Additional Individuals present: none  HPI  diabetes Mellitus Type II:  Insulin dependent -  Has been unstable due to uncontrolled whoever near goal of A1C 7 DM dx 10+ years ago Currently managing with insulin (humalog and lantus), jardiance 73m 10 units TID humalog and 60 units lantus at bedtime Pt notes good med compliance - he takes what he's told Pt has no SE from meds. Fasting CBGs typically run - not currently checking - battery died Denies: Polyuria, polydipsia, polyphagia, vision changes, or neuropathy Recent pertinent labs: Lab Results  Component Value Date   HGBA1C 7.3 (H) 04/14/2019   HGBA1C 7.2 (H) 12/02/2018   HGBA1C 7.4 (H) 09/01/2018      Component Value Date/Time   NA 139 04/14/2019 0000   NA 139 01/06/2016 0918   K 4.2 04/14/2019 0000   CL 107 04/14/2019 0000   CO2 26 04/14/2019 0000   GLUCOSE 93 04/14/2019 0000   BUN 17 04/14/2019 0000   BUN 16 01/06/2016 0918   CREATININE 1.12 04/14/2019 0000   CALCIUM 10.2 04/14/2019 0000   PROT 7.5 04/14/2019 0000   PROT 7.5 12/23/2015 0959   ALBUMIN 4.0 12/07/2016 0957   ALBUMIN 3.8 01/06/2016 0918   AST 14 04/14/2019 0000   ALT 12 04/14/2019 0000   ALKPHOS 87 12/07/2016 0957   BILITOT 0.7 04/14/2019 0000   BILITOT 0.5 12/23/2015 0959   GFRNONAA 65 04/14/2019 0000   GFRAA 76 04/14/2019 0000   Current diet: well balanced - he's cut back on food, hes trying to avoid fried foods, pork chops, fried chicken Current exercise: walking UTD on DM foot exam and eye exam ACEI/ARB: Yes Statin: Yes   Hypertension:  stable Currently managed on norvasc 10 mg, losartan 100 mg Pt reports excellent med compliance and denies any SE.   He monitors his BP at home intermittently, no highs or low Blood pressure today is well controlled. BP Readings from Last 3 Encounters:  04/14/19 118/60  02/23/19 120/74  02/09/19 119/70  Pt denies CP, SOB, exertional sx, LE edema, palpitation, Ha's, visual disturbances,  lightheadedness, hypotension, syncope. Dietary efforts for BP? Baking foods - trying to do healthy diet and he is walking  Hyperlipidemia: Current Medication Regimen:  lipitor 40 mg  Tolerating well w/o SE, notes excellent compliance - but he has been taking care of his wife in and out of the hospital and he has let go of his diet and exercise Last Lipids: Lab Results  Component Value Date   CHOL 127 04/14/2019   HDL 34 (L) 04/14/2019   LDLCALC 78 04/14/2019   TRIG 74 04/14/2019   CHOLHDL 3.7 04/14/2019  last labs lipids well controlled fish oil for HDL     -  Denies: Chest pain, shortness of breath, myalgias.  Patient states that usually about twice a year he will have sinus congestion typically occurs when the weather gets colder or in the springtime and that his past PCPs will call him in 10 pills for him to take and it would "knock it out" he does not know what the medicine is called but it used to work for him.  Patient Active Problem List   Diagnosis Date Noted  . Coronary artery disease 10/25/2017  . 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[place]) 05/14/2017  . BRBPR (bright red blood per rectum)  12/07/2016  . History of colonoscopy with polypectomy 12/07/2016  . Medication monitoring encounter 12/07/2016  . Fatty liver 09/11/2016  . Aneurysm of aorta ([place]) 09/11/2016  . 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 Tinea pedis 06/25/2016  . Closed intertrochanteric fracture of left hip, sequela 05/30/2016  . Hx of pulmonary embolus 05/18/2016  . Essential hypertension, benign 12/23/2015  . Hyperlipidemia 12/23/2015  . Hx of tobacco use, presenting hazards to health 12/23/2015  . Morbid obesity ([place]) 12/23/2015  . Type 2 diabetes mellitus with diabetic nephropathy, 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[place]) 01/25/2015    Current Outpatient Medications:  .  acetaminophen (TYLENOL) 500 MG tablet, Take 500 mg by mouth every 6 (six) hours as needed., Disp: , Rfl:  .  amLODipine (NORVASC) 10 MG tablet, Take 1 tablet (10 mg total) by mouth daily., Disp: 90 tablet, Rfl: 1 .  atorvastatin (LIPITOR) 40 MG tablet, Take 1 tablet (40 mg total) by mouth at bedtime., Disp: 90 tablet, Rfl: 3 .  Blood Glucose Monitoring Suppl (ONE TOUCH ULTRA 2) w/Device KIT, Check fingerstick blood sugars three times a day; DX E11.65, LON 99 months, Disp: 1 each, Rfl: 0 .  cholecalciferol (VITAMIN D) 1000 units tablet, Take 1,000 Units by mouth 2 (two) times daily. , Disp: , Rfl:  .  Fish Oil-Cholecalciferol (FISH OIL + D3) 1200-1000 MG-UNIT CAPS, Take 1,200 mg by mouth daily., Disp: 90 capsule, Rfl: 3 .  fluticasone (FLONASE) 50 MCG/ACT nasal spray, Place 2 sprays into both nostrils daily. (Patient taking differently: Place 2 sprays into both nostrils daily as needed. ), Disp: 16 g, Rfl: 6 .  glucose blood (ONE TOUCH ULTRA TEST) test strip, USE AS INSTRUCTEDDX E11.65, LON 99 MONTHS CHECK FINGERSTICK BLOOD SUGARS THREE TIMES A DAY, Disp: 300 each, Rfl: 11 .  HUMALOG KWIKPEN 100 UNIT/ML KwikPen, INJECT 0.07 MLS (7 UNITS TOTAL) INTO THE SKIN 3 (THREE) TIMES DAILY WITH MEALS. SHORT-ACTING INSULIN, Disp: 15 mL, Rfl: 3 .  Insulin Pen  Needle (B-D UF III MINI PEN NEEDLES) 31G X 5 MM MISC, USE ONCE A DAY WITH LONG-ACTING INSULIN, AND UP TO 3X A DAY WITH SHORT-ACTING INSULIN, Disp: 300 each, Rfl: 3 .  JARDIANCE 25 MG TABS tablet, TAKE 1 TABLET BY MOUTH DAILY. (HIGHER DOSE), Disp: 90 tablet, Rfl: 3 .  Lancets (ONETOUCH ULTRASOFT) lancets, Check fingerstick blood sugar three times a day; E11.65, LON 99 months, Disp: 100 each, Rfl: 12 .  LANTUS SOLOSTAR 100 UNIT/ML Solostar Pen, INJECT 60 UNITS INTO THE SKIN DAILY. OR AS DIRECTED BY YOUR DOCTOR, Disp: 15 mL, Rfl: 5 .  losartan (COZAAR) 100 MG tablet, Take 1 tablet (100 mg total) by mouth daily., Disp: 90 tablet, Rfl: 2 No Known Allergies  Past Surgical History:  Procedure Laterality Date  . COLONOSCOPY WITH PROPOFOL N/A 01/15/2017   Procedure: COLONOSCOPY WITH PROPOFOL;  Surgeon: Jonathon Bellows, MD;  Location: [facility]COPY;  Service: Endoscopy;  Laterality: N/A;  . EYE SURGERY    . INTRAMEDULLARY (IM) NAIL INTERTROCHANTERIC Left 04/14/2016   Procedure: INTRAMEDULLARY (IM) NAIL INTERTROCHANTRIC;  Surgeon: Corky Mull, MD;  Location: [facility] ORS;  Service: Orthopedics;  Laterality: Left;  . JOINT REPLACEMENT Left 04/2016  . TOTAL HIP ARTHROPLASTY     Family History  Problem Relation Age of Onset  . Cancer Mother   . Diabetes Mother   . Heart disease Father   . Alcohol abuse Brother    Social History   Socioeconomic History  . Marital status: Married    Spouse name: Not on file  . Number of children: Not on file  . Years of education: Not on file  . Highest education level: Not on file  Occupational History  . Not on file  Tobacco Use  . Smoking status: Former Smoker    Types: Cigarettes    Quit date: 09/01/1978    Years since quitting: 40.9  . Smokeless tobacco: Never Used  Substance and Sexual Activity  . Alcohol use: No    Alcohol/week: 0.0 standard drinks  . Drug use: No  . Sexual activity: Not Currently  Other Topics Concern  . Not on file  Social History  Narrative  . Not on file   Social Determinants of Health   Financial Resource Strain: Low Risk   . Difficulty of Paying Living Expenses: Not very hard  Food Insecurity: No Food Insecurity  . Worried About Charity fundraiser in the Last Year: Never true  . Ran Out of Food in the Last Year: Never true  Transportation Needs: No Transportation Needs  . Lack of Transportation (Medical): No  . Lack of Transportation (Non-Medical): No  Physical Activity: Insufficiently Active  . Days of Exercise per Week: 5 days  . Minutes of Exercise per Session: 20 min  Stress: No Stress Concern Present  . Feeling of Stress : Not at all  Social Connections: Slightly Isolated  . Frequency of Communication with Friends and Family: More than three times a week  . Frequency of Social Gatherings with Friends and Family: Three times a week  . Attends Religious Services: More than 4 times per year  . Active Member of Clubs or Organizations: No  . Attends Archivist Meetings: Never  . Marital Status: Married  Human resources officer Violence: Not At Risk  . Fear of Current or Ex-Partner: No  . Emotionally Abused: No  . Physically Abused: No  . Sexually Abused: No    Chart Review Today: I personally reviewed active problem list, medication list, allergies, family history, social history, health maintenance, notes from last encounter, lab results, imaging with the patient/caregiver today.  Review of Systems  Constitutional: Negative.   HENT: Negative.   Eyes: Negative.   Respiratory: Negative.   Cardiovascular: Negative.   Gastrointestinal: Negative.   Endocrine: Negative.   Genitourinary: Negative.   Musculoskeletal: Negative.   Skin: Negative.   Allergic/Immunologic: Negative.   Neurological: Negative.   Hematological: Negative.   Psychiatric/Behavioral: Negative.   All other systems reviewed and are negative.    Objective:    Virtual encounter, vitals limited, only able to obtain the  following: Today's Vitals   08/14/19 0954  Weight: 300 lb (136.1 kg)  Height: 6' (1.829 m)   Body mass index is 40.69 kg/m. Nursing Note and Vital Signs reviewed.  Physical Exam Vitals and nursing note reviewed.  Neurological:  Mental Status: He is alert.  Psychiatric:        Mood and Affect: Mood normal.        Behavior: Behavior normal.     PE limited by telephone encounter   PHQ2/9: Depression screen [facility] 2/9 08/14/2019 04/14/2019 12/11/2018 12/02/2018 10/17/2018  Decreased Interest 0 0 0 0 0  Down, Depressed, Hopeless 0 0 0 0 0  PHQ - 2 Score 0 0 0 0 0  Altered sleeping 0 0 0 0 -  Tired, decreased energy 0 0 0 0 -  Change in appetite 0 0 0 0 -  Feeling bad or failure about yourself  0 0 0 0 -  Trouble concentrating 0 0 0 0 -  Moving slowly or fidgety/restless 0 0 0 0 -  Suicidal thoughts 0 0 0 0 -  PHQ-9 Score 0 0 0 0 -  Difficult doing work/chores Not difficult at all Not difficult at all Not difficult at all Not difficult at all -  Some recent data might be hidden   PHQ-2/9 Result is neg, reviewed today  Fall Risk: Fall Risk  08/14/2019 04/14/2019 12/11/2018 12/02/2018 10/17/2018  Falls in the past year? 0 0 0 0 0  Number falls in past yr: 0 0 0 - 0  Injury with Fall? 0 0 0 - 0  Follow up - - - - Falls prevention discussed     Assessment and Plan:   1. Mixed hyperlipidemia Has been stable Recent diet changes, unfortunately more fast food Recheck labs Patient compliant with medications - CMP w GFR - Lipid Panel  2. Diabetes mellitus due to underlying condition with diabetic autonomic neuropathy, unspecified whether long term insulin use (HCC) Patient not checking his blood sugars due to meter ran out of battery encouraged him to get a battery check his blood sugars and notify us of readings encouraged him to do this immediately because of how unsafe it is to treat himself with insulin without knowing his blood sugar levels. Will need to come in for labs to  monitor Has been uncontrolled but A1c has been near goal for the past year - CMP w GFR - A1C - Microalbumin, urine  3. Morbid obesity ([place]) Weight gain due to some life stressors his wife is ill his diet has suffered - CMP w GFR - Lipid Panel - A1C  4. Encounter for medication monitoring  - CMP w GFR - Lipid Panel - A1C  5. Aortic atherosclerosis ([place]) Still compliant with all medications  6. Essential hypertension, benign Unable to assess blood pressure control it has been stable and well-controlled in the past, currently unable to check his blood pressure reading, will try to have him come into clinic for labs and get vital signs at that time - CMP w GFR  7. Type 2 diabetes mellitus with diabetic nephropathy, 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HCC) See above - CMP w GFR - A1C - Microalbumin, urine  8. 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HCC) Recheck microalbumin - CMP w GFR - Microalbumin, urine  9. Thoracic aortic aneurysm without rupture ([place]) Managed with blood pressure control and compliant with med tx, no symptoms of rupture or dissection  I discussed the assessment and treatment plan with the patient. The patient was provided an opportunity to ask questions and all were answered. The patient agreed with the plan and demonstrated an understanding of the instructions.   The patient was advised to call back or seek an in-person evaluation  if the symptoms worsen or if the condition fails to improve as anticipated.  I provided 18 minutes of non-face-to-face time during this encounter.  Delsa Grana, PA-C 08/14/19 12:10 PM

## 2019-08-17 ENCOUNTER — Encounter: Payer: Self-pay | Admitting: Podiatry

## 2019-08-17 ENCOUNTER — Other Ambulatory Visit: Payer: Self-pay

## 2019-08-17 ENCOUNTER — Ambulatory Visit: Payer: Medicare PPO | Admitting: Podiatry

## 2019-08-17 DIAGNOSIS — B351 Tinea unguium: Secondary | ICD-10-CM | POA: Diagnosis not present

## 2019-08-17 DIAGNOSIS — M79676 Pain in unspecified toe(s): Secondary | ICD-10-CM | POA: Diagnosis not present

## 2019-08-17 DIAGNOSIS — E0843 Diabetes mellitus due to underlying condition with diabetic autonomic (poly)neuropathy: Secondary | ICD-10-CM

## 2019-08-17 NOTE — Progress Notes (Signed)
Complaint:  Visit Type: Patient returns to my office for continued preventative foot care services. Complaint: Patient states" my nails have grown long and thick and become painful to walk and wear shoes" Patient has been diagnosed with DM with no foot complications. The patient presents for preventative foot care services.  Podiatric Exam: Vascular: dorsalis pedis and posterior tibial pulses are palpable bilateral. Capillary return is immediate. Temperature gradient is WNL. Skin turgor WNL  Sensorium: Normal Semmes Weinstein monofilament test. Normal tactile sensation bilaterally. Nail Exam: Pt has thick disfigured discolored nails with subungual debris noted bilateral entire nail hallux through fifth toenails Ulcer Exam: There is no evidence of ulcer or pre-ulcerative changes or infection. Orthopedic Exam: Muscle tone and strength are WNL. No limitations in general ROM. No crepitus or effusions noted. Foot type and digits show no abnormalities. Bony prominences are unremarkable. Skin: No Porokeratosis. No infection or ulcers  Diagnosis:  Onychomycosis, , Pain in right toe, pain in left toes  Treatment & Plan Procedures and Treatment: Consent by patient was obtained for treatment procedures.   Debridement of mycotic and hypertrophic toenails, 1 through 5 bilateral and clearing of subungual debris. No ulceration, no infection noted.  Return Visit-Office Procedure: Patient instructed to return to the office for a follow up visit 3 months for continued evaluation and treatment.    Sims Laday DPM 

## 2019-08-18 ENCOUNTER — Other Ambulatory Visit: Payer: Self-pay

## 2019-08-18 DIAGNOSIS — E1121 Type 2 diabetes mellitus with diabetic nephropathy: Secondary | ICD-10-CM

## 2019-08-18 DIAGNOSIS — Z794 Long term (current) use of insulin: Secondary | ICD-10-CM

## 2019-08-18 MED ORDER — ACCU-CHEK AVIVA PLUS VI STRP: ORAL_STRIP | 12 refills | Status: DC

## 2019-08-18 MED ORDER — ACCU-CHEK MULTICLIX LANCETS MISC: 12 refills | Status: DC

## 2019-08-18 MED ORDER — ACCU-CHEK AVIVA PLUS W/DEVICE KIT: 1.0000 | PACK | Freq: Two times a day (BID) | 0 refills | Status: DC

## 2019-10-09 ENCOUNTER — Other Ambulatory Visit: Payer: Self-pay | Admitting: Family Medicine

## 2019-10-12 ENCOUNTER — Telehealth: Payer: Self-pay | Admitting: Gastroenterology

## 2019-10-12 ENCOUNTER — Other Ambulatory Visit: Payer: Self-pay

## 2019-10-12 DIAGNOSIS — Z86010 Personal history of colonic polyps: Secondary | ICD-10-CM

## 2019-10-12 DIAGNOSIS — I10 Essential (primary) hypertension: Secondary | ICD-10-CM

## 2019-10-12 MED ORDER — AMLODIPINE BESYLATE 10 MG PO TABS: 10.0000 mg | ORAL_TABLET | Freq: Every day | ORAL | 0 refills | Status: DC

## 2019-10-12 NOTE — Telephone Encounter (Signed)
Returned patients call to schedule his 3 year repeat colonoscopy with Dr. Vicente Males.  He is due in June 2021.  He will check his wife's schedule and call me back to schedule his repeat colonoscopy.  Thanks,  [place], [place]

## 2019-10-12 NOTE — Telephone Encounter (Signed)
PT IS CALLING TO SCHEDULE 3 YEAR F/U COLONOSCOPY FROM RECALL

## 2019-10-12 NOTE — Telephone Encounter (Signed)
Hypertension medication request:08/14/19  Last office visit pertaining to hypertension:  BP Readings from Last 3 Encounters:  04/14/19 118/60  02/23/19 120/74  02/09/19 119/70    Lab Results  Component Value Date   CREATININE 1.12 04/14/2019   BUN 17 04/14/2019   NA 139 04/14/2019   K 4.2 04/14/2019   CL 107 04/14/2019   CO2 26 04/14/2019     No follow-ups on file.

## 2019-10-13 NOTE — Telephone Encounter (Signed)
Appt scheduled with Leisa for 3.11.2021

## 2019-10-15 ENCOUNTER — Telehealth: Payer: Self-pay | Admitting: Family Medicine

## 2019-10-15 ENCOUNTER — Encounter: Payer: Self-pay | Admitting: Family Medicine

## 2019-10-15 ENCOUNTER — Ambulatory Visit: Payer: Medicare PPO | Admitting: Family Medicine

## 2019-10-15 ENCOUNTER — Other Ambulatory Visit: Payer: Self-pay

## 2019-10-15 VITALS — BP 122/82 | HR 89 | Temp 98.1°F | Resp 16 | Ht 72.0 in | Wt 273.2 lb

## 2019-10-15 DIAGNOSIS — B351 Tinea unguium: Secondary | ICD-10-CM | POA: Diagnosis not present

## 2019-10-15 DIAGNOSIS — E1129 Type 2 diabetes mellitus with other diabetic kidney complication: Secondary | ICD-10-CM

## 2019-10-15 DIAGNOSIS — I712 Thoracic aortic aneurysm, without rupture, unspecified: Secondary | ICD-10-CM

## 2019-10-15 DIAGNOSIS — E1121 Type 2 diabetes mellitus with diabetic nephropathy: Secondary | ICD-10-CM | POA: Diagnosis not present

## 2019-10-15 DIAGNOSIS — R269 Unspecified abnormalities of gait and mobility: Secondary | ICD-10-CM

## 2019-10-15 DIAGNOSIS — R809 Proteinuria, unspecified: Secondary | ICD-10-CM

## 2019-10-15 DIAGNOSIS — I2510 Atherosclerotic heart disease of native coronary artery without angina pectoris: Secondary | ICD-10-CM

## 2019-10-15 DIAGNOSIS — I700 Atherosclerosis of aorta: Secondary | ICD-10-CM

## 2019-10-15 DIAGNOSIS — I10 Essential (primary) hypertension: Secondary | ICD-10-CM

## 2019-10-15 DIAGNOSIS — Z5181 Encounter for therapeutic drug level monitoring: Secondary | ICD-10-CM

## 2019-10-15 DIAGNOSIS — J310 Chronic rhinitis: Secondary | ICD-10-CM

## 2019-10-15 DIAGNOSIS — L602 Onychogryphosis: Secondary | ICD-10-CM

## 2019-10-15 DIAGNOSIS — E0843 Diabetes mellitus due to underlying condition with diabetic autonomic (poly)neuropathy: Secondary | ICD-10-CM

## 2019-10-15 DIAGNOSIS — E782 Mixed hyperlipidemia: Secondary | ICD-10-CM

## 2019-10-15 DIAGNOSIS — Z794 Long term (current) use of insulin: Secondary | ICD-10-CM

## 2019-10-15 MED ORDER — FLUTICASONE PROPIONATE 50 MCG/ACT NA SUSP: 2.0000 | Freq: Every day | NASAL | 6 refills | Status: DC

## 2019-10-15 MED ORDER — LEVOCETIRIZINE DIHYDROCHLORIDE 5 MG PO TABS: 5.0000 mg | ORAL_TABLET | Freq: Every evening | ORAL | 2 refills | Status: DC

## 2019-10-15 NOTE — Telephone Encounter (Signed)
Patient called in stating that he was told by the office that the PCP would have to be the one to schedule that appointment. Please advise.

## 2019-10-15 NOTE — Telephone Encounter (Signed)
He is asking that the appt be scheduled around 10-11

## 2019-10-15 NOTE — Telephone Encounter (Signed)
Pt would like a referral to get a CT to check his vascular. He said it is in his chart what he is talking about because Dr Sanda Klein is the one who orginally suggested it. Said that if it bust he could possibly die

## 2019-10-15 NOTE — Progress Notes (Signed)
Name: Edgar [name]   MRN: [id number]    DOB: [birth year]   Date:10/15/2019       Progress Note  Chief Complaint  Patient presents with  . Follow-up  . Medication Refill  . Hypertension  . Hyperlipidemia  . Diabetes     Subjective:   Edgar [name] is a 74 y.o. male, presents to clinic for routine follow up on the conditions listed above.  He returns in person in for routine follow-up, I had previously seen and evaluated him, 6 months ago in September and then again with a virtual encounter 3 months ago.  He does not recall meeting me in the past.  He provides almost verbatim same history and circumstances that he gave months ago including sleeping in his car, his wife's illness, and difficulty coming in for appointments.  He is also mentioned again having a rattling in his chest when he lays down at night and some congestion and he can ask for what ever medication Dr. Lucita Lora used to prescribe him because it managed at effectively when he had similar symptoms for about half the year.  Patient is by himself today, he states he does not have his hearing aids in he is having difficult time hearing and understanding things that I am telling him today.  I explained to him best I could that I have looked in the chart when we spoke previously to see if I can find what medication he was managed on before and I could not find anything from Dr. Lucita Lora.  There was Flonase in the chart which he has not taken I did prescribe this an antihistamine again today.    Patient follows up for insulin-dependent diabetes, he is continuing to give himself insulin and he does not check his blood sugar at all he states it so well controlled he does not need to check anything.  He continues to give himself 60 units of Lantus a day and also Humalog 7 units 3 times a day he denies going to endocrinology for any of this management.   He is also on Jardiance 25 mg. He is due for his foot exam today states that he has  really good sensation to his feet even feels slightly sensitive he has difficulty putting his own socks and shoes on especially to his left foot.  He denies any wounds or ulcers.  After doing diabetic foot exam today I did refer him to podiatry but later saw that he does have upcoming appointment with podiatry next month.  He does have thickened long nails some fungal disease and some mild ingrown toenails. Patient denies any suspected hypoglycemic episodes  Denies: Polyuria, polydipsia, polyphagia, vision changes - he does have some neuropathy bilaterally  His wife is still not home with him and he continues to eat fast food and has a generally poor diet right now  HTN: Managed on Norvasc and losartan -compliant with medications, he denies any near syncope, palpitations, chest pain Several months ago he was monitoring his blood pressure at home was exercising and careful with his foods but again he is not been able to do a lot of these things with his wife ill and far away from home being in the car and driving to her a lot.  His blood pressure here today is well controlled BP Readings from Last 3 Encounters:  10/15/19 122/82  04/14/19 118/60  02/23/19 120/74    Pt mentions at end of visit today his congestion  and needing medicine that Dr. Lucita Lora used to give him -he also mentioned having pain and needing pain medication And later after patient was gone CMA did mention to me that he is concerned about getting a CT scan or watching aneurysm  Have concerns about pts memory - though some big changes in his life, change in providers, and virtual visits have likely made it difficult for him to get established with me and remember prior visits.    Encouraged him to come every 3 months with IDDM and not checking his sugars, explained that I would need to watch labs closely and every 3 months.      Patient Active Problem List   Diagnosis Date Noted  . Coronary artery disease 10/25/2017  .  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[place]) 05/14/2017  . BRBPR (bright red blood per rectum) 12/07/2016  . History of colonoscopy with polypectomy 12/07/2016  . Medication monitoring encounter 12/07/2016  . Fatty liver 09/11/2016  . Aneurysm of aorta ([place]) 09/11/2016  . 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 Tinea pedis 06/25/2016  . Closed intertrochanteric fracture of left hip, sequela 05/30/2016  . Hx of pulmonary embolus 05/18/2016  . Essential hypertension, benign 12/23/2015  . Hyperlipidemia 12/23/2015  . Hx of tobacco use, presenting hazards to health 12/23/2015  . Morbid obesity ([place]) 12/23/2015  . Type 2 diabetes mellitus with diabetic nephropathy, 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[place]) 01/25/2015    Past Surgical History:  Procedure Laterality Date  . COLONOSCOPY WITH PROPOFOL N/A 01/15/2017   Procedure: COLONOSCOPY WITH PROPOFOL;  Surgeon: Jonathon Bellows, MD;  Location: [facility] ENDOSCOPY;  Service: Endoscopy;  Laterality: N/A;  . EYE SURGERY    . INTRAMEDULLARY (IM) NAIL INTERTROCHANTERIC Left 04/14/2016   Procedure: INTRAMEDULLARY (IM) NAIL INTERTROCHANTRIC;  Surgeon: Corky Mull, MD;  Location: [facility] ORS;  Service: Orthopedics;  Laterality: Left;  . JOINT REPLACEMENT Left 04/2016  . TOTAL HIP ARTHROPLASTY      Family History  Problem Relation Age of Onset  . Cancer Mother   . Diabetes Mother   . Heart disease Father   . Alcohol abuse Brother     Social History   Tobacco Use  . Smoking status: Former Smoker    Types: Cigarettes    Quit date: 09/01/1978    Years since quitting: 41.1  . Smokeless tobacco: Never Used  Substance Use Topics  . Alcohol use: No    Alcohol/week: 0.0 standard drinks  . Drug use: No      Current Outpatient Medications:  .  acetaminophen (TYLENOL) 500 MG tablet, Take 500 mg by mouth every 6 (six) hours as needed., Disp: , Rfl:  .  amLODipine (NORVASC) 10 MG tablet, Take 1 tablet (10  mg total) by mouth daily., Disp: 30 tablet, Rfl: 0 .  atorvastatin (LIPITOR) 40 MG tablet, Take 1 tablet (40 mg total) by mouth at bedtime., Disp: 90 tablet, Rfl: 3 .  Blood Glucose Monitoring Suppl (ACCU-CHEK AVIVA PLUS) w/Device KIT, 1 Device by Does not apply route 2 (two) times daily. Dx E11.21 Check BG BID, Disp: 1 kit, Rfl: 0 .  Blood Glucose Monitoring Suppl (ONE TOUCH ULTRA 2) w/Device KIT, Check fingerstick blood sugars three times a day; DX E11.65, LON 99 months, Disp: 1 each, Rfl: 0 .  cholecalciferol (VITAMIN D) 1000 units tablet, Take 1,000 Units by mouth 2 (two) times daily. , Disp: , Rfl:  .  Fish Oil-Cholecalciferol (FISH OIL +  D3) 1200-1000 MG-UNIT CAPS, Take 1,200 mg by mouth daily., Disp: 90 capsule, Rfl: 3 .  fluticasone (FLONASE) 50 MCG/ACT nasal spray, Place 2 sprays into both nostrils daily. (Patient taking differently: Place 2 sprays into both nostrils daily as needed. ), Disp: 16 g, Rfl: 6 .  glucose blood (ACCU-CHEK AVIVA PLUS) test strip, Use as instructed D:E11.21 Check BG bid, Disp: 100 each, Rfl: 12 .  glucose blood (ONE TOUCH ULTRA TEST) test strip, USE AS INSTRUCTEDDX E11.65, LON 99 MONTHS CHECK FINGERSTICK BLOOD SUGARS THREE TIMES A DAY, Disp: 300 each, Rfl: 11 .  HUMALOG KWIKPEN 100 UNIT/ML KwikPen, INJECT 0.07 MLS (7 UNITS TOTAL) INTO THE SKIN 3 (THREE) TIMES DAILY WITH MEALS. SHORT-ACTING INSULIN, Disp: 15 mL, Rfl: 3 .  Insulin Pen Needle (B-D UF III MINI PEN NEEDLES) 31G X 5 MM MISC, USE ONCE A DAY WITH LONG-ACTING INSULIN, AND UP TO 3X A DAY WITH SHORT-ACTING INSULIN, Disp: 300 each, Rfl: 3 .  JARDIANCE 25 MG TABS tablet, TAKE 1 TABLET BY MOUTH DAILY. (HIGHER DOSE), Disp: 90 tablet, Rfl: 3 .  Lancets (ACCU-CHEK MULTICLIX) lancets, Use as instructed Dx Ell.21, Check BG bid, Disp: 100 each, Rfl: 12 .  Lancets (ONETOUCH ULTRASOFT) lancets, Check fingerstick blood sugar three times a day; E11.65, LON 99 months, Disp: 100 each, Rfl: 12 .  LANTUS SOLOSTAR 100 UNIT/ML  Solostar Pen, INJECT 60 UNITS INTO THE SKIN DAILY. OR AS DIRECTED BY YOUR DOCTOR, Disp: 15 mL, Rfl: 0 .  losartan (COZAAR) 100 MG tablet, Take 1 tablet (100 mg total) by mouth daily., Disp: 90 tablet, Rfl: 2  No Known Allergies  Chart Review Today: I personally reviewed active problem list, medication list, allergies, family history, social history, health maintenance, notes from last encounter, lab results, imaging with the patient/caregiver today.  Review of Systems  10 Systems reviewed and are negative for acute change except as noted in the HPI.     Objective:    Vitals:   10/15/19 1044  BP: 122/82  Pulse: 89  Resp: 16  Temp: 98.1 F (36.7 C)  SpO2: 94%  Weight: 273 lb 3.2 oz (123.9 kg)  Height: 6' (1.829 m)    Body mass index is 37.05 kg/m.  Physical Exam Vitals and nursing note reviewed.  Constitutional:      General: He is not in acute distress.    Appearance: Normal appearance. He is well-developed. He is obese. He is not ill-appearing, toxic-appearing or diaphoretic.     Interventions: Face mask in place.     Comments: Elderly male, NAD, HOH  HENT:     Head: Normocephalic and atraumatic.     Jaw: No trismus.     Right Ear: External ear normal. Decreased hearing noted.     Left Ear: External ear normal. Decreased hearing noted.     Ears:     Comments: HOH- did not have hearing aids in Eyes:     General: Lids are normal. No scleral icterus.    Conjunctiva/sclera: Conjunctivae normal.     Pupils: Pupils are equal, round, and reactive to light.  Neck:     Trachea: Trachea and phonation normal. No tracheal deviation.  Cardiovascular:     Rate and Rhythm: Normal rate and regular rhythm.     Pulses: Normal pulses.          Radial pulses are 2+ on the right side and 2+ on the left side.       Posterior tibial pulses are 2+ on  the right side and 2+ on the left side.     Heart sounds: Normal heart sounds. No murmur. No friction rub. No gallop.      Comments:  No LE pitting edema Pulmonary:     Effort: Pulmonary effort is normal. No respiratory distress.     Breath sounds: Normal breath sounds. No stridor. No wheezing, rhonchi or rales.  Abdominal:     General: Bowel sounds are normal. There is no distension.     Palpations: Abdomen is soft.     Tenderness: There is no abdominal tenderness. There is no guarding or rebound.  Musculoskeletal:        General: Injury:      Cervical back: Normal range of motion and neck supple.     Comments: Some decreased ROM observed to left hip, difficulty with taking off and putting on shoes and socks  Skin:    General: Skin is warm and dry.     Capillary Refill: Capillary refill takes less than 2 seconds.     Coloration: Skin is not jaundiced.     Findings: No rash.     Nails: There is no clubbing.  Neurological:     Mental Status: He is alert.     Cranial Nerves: No dysarthria or facial asymmetry.     Motor: No tremor or abnormal muscle tone.     Gait: Gait abnormal (antalgic gait).  Psychiatric:        Mood and Affect: Mood normal.        Speech: Speech normal.        Behavior: Behavior normal. Behavior is cooperative.        Cognition and Memory: Memory is impaired.       Diabetic Foot Exam: Diabetic Foot Exam - Simple   Simple Foot Form Diabetic Foot exam was performed with the following findings: Yes 10/15/2019 11:20 AM  Visual Inspection Sensation Testing Pulse Check Comments     PHQ2/9: Depression screen [facility] 2/9 10/15/2019 08/14/2019 04/14/2019 12/11/2018 12/02/2018  Decreased Interest 0 0 0 0 0  Down, Depressed, Hopeless 0 0 0 0 0  PHQ - 2 Score 0 0 0 0 0  Altered sleeping 0 0 0 0 0  Tired, decreased energy 0 0 0 0 0  Change in appetite 0 0 0 0 0  Feeling bad or failure about yourself  0 0 0 0 0  Trouble concentrating 0 0 0 0 0  Moving slowly or fidgety/restless 0 0 0 0 0  Suicidal thoughts 0 0 0 0 0  PHQ-9 Score 0 0 0 0 0  Difficult doing work/chores Not difficult at all Not  difficult at all Not difficult at all Not difficult at all Not difficult at all  Some recent data might be hidden    phq 9 is neg, reviewed  Fall Risk: Fall Risk  10/15/2019 08/14/2019 04/14/2019 12/11/2018 12/02/2018  Falls in the past year? 0 0 0 0 0  Number falls in past yr: 0 0 0 0 -  Injury with Fall? 0 0 0 0 -  Follow up - - - - -    Functional Status Survey: Is the patient deaf or have difficulty hearing?: Yes Does the patient have difficulty seeing, even when wearing glasses/contacts?: No Does the patient have difficulty concentrating, remembering, or making decisions?: No Does the patient have difficulty walking or climbing stairs?: Yes Does the patient have difficulty dressing or bathing?: No Does the patient have difficulty doing errands alone  such as visiting a doctor's office or shopping?: No   Assessment & Plan:   Patient presents by himself, history somewhat limited patient is repeating nearly verbatim what he has told me with past 2 visits over the last 6 months.  He does not remember who I am and he did not have his hearing aids in today so the visit was very difficult not sure how much she was able to hear or understand of me explaining things we had previously discussed explaining who I was and how we have met and done visits over the past 6 months.  Pt has several chronic conditions, unfortunately been difficult to manage them with his wife in the hospital, with virtual encounters, patient not coming in for lab work, and extra barriers with communication.  He also has had a significant change to his diet lifestyle with his wife being very ill, he is traveling a lot in his car he is eating a lot of fast food.  Labs from her last visit in December were printed and obtained today since they were not yet done-   consisting of the following labs and past OV with A&P:  1. Mixed hyperlipidemia Has been stable Recent diet changes, unfortunately more fast food Recheck labs Patient  compliant with medications - CMP w GFR - Lipid Panel  2. Diabetes mellitus due to underlying condition with diabetic autonomic neuropathy, unspecified whether long term insulin use (HCC) Patient not checking his blood sugars due to meter ran out of battery encouraged him to get a battery check his blood sugars and notify us of readings encouraged him to do this immediately because of how unsafe it is to treat himself with insulin without knowing his blood sugar levels. Will need to come in for labs to monitor Has been uncontrolled but A1c has been near goal for the past year - CMP w GFR - A1C - Microalbumin, urine  3. Morbid obesity ([place]) Weight gain due to some life stressors his wife is ill his diet has suffered - CMP w GFR - Lipid Panel - A1C  4. Encounter for medication monitoring  - CMP w GFR - Lipid Panel - A1C  5. Aortic atherosclerosis ([place]) Still compliant with all medications  6. Essential hypertension, benign Unable to assess blood pressure control it has been stable and well-controlled in the past, currently unable to check his blood pressure reading, will try to have him come into clinic for labs and get vital signs at that time - CMP w GFR  7. Type 2 diabetes mellitus with diabetic nephropathy, 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HCC) See above - CMP w GFR - A1C - Microalbumin, urine  8. 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HCC) Recheck microalbumin - CMP w GFR - Microalbumin, urine  9. Thoracic aortic aneurysm without rupture ([place]) Managed with blood pressure control and compliant with med tx, no symptoms of rupture or dissection  1. Type 2 diabetes mellitus with diabetic nephropathy, 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HCC) Diabetic foot exam done today -patient has complications secondary to diabetes he is up-to-date on his urine microalbuminuria labs, previously diabetes was near goal last A1c was 6 months ago was 7.3 -not at  goal but very close Worsening diet and lifestyle - Ambulatory referral to Ophthalmology Patient is scheduled to see podiatry in the next couple weeks  2. Onychomycosis of toenail Per podiatry -nothing appears acutely infected or ingrown but patient does seem very high risk due to  neuropathy decreased mobility and ability to move very well to put on her take off his own shoes and socks to his left side, encourage close follow-up with podiatry  3. Gait abnormality Unclear if this is new or not patient briefly mentioned at the end of the visit that he has pain and wants pain he will because he has pain that shoots down his left side -explained that I would need to look in the chart see if there is anything that we could give him that would be safe encouraged him to come back to address this  4. Essential hypertension, benign Stable, well controlled today, continue losartan 100 and Norvasc 10, labs pending  5. Mixed hyperlipidemia Compliant with Lipitor denies any myalgias, no obvious jaundice, patient did not complain of any chest pain claudication -labs pending   6. Morbid obesity ([place]) - weight and BMI unchanged from last in office visit Wt Readings from Last 5 Encounters:  10/15/19 273 lb 3.2 oz (123.9 kg)  08/14/19 300 lb (136.1 kg)  04/14/19 275 lb 8 oz (125 kg)  02/23/19 282 lb 9.6 oz (128.2 kg)  02/09/19 283 lb 6.4 oz (128.5 kg)   BMI Readings from Last 5 Encounters:  10/15/19 37.05 kg/m  08/14/19 40.69 kg/m  04/14/19 37.36 kg/m  02/23/19 38.33 kg/m  02/09/19 38.44 kg/m    7. 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[place]) See above  8. Thoracic aortic aneurysm without rupture ([place]) Last imaging reviewed: IMPRESSION: 4.3 cm ascending thoracic aortic aneurysm, stable. Scattered aortic atherosclerosis. Tortuosity of the thoracic aorta. Recommend annual imaging followup by CTA or MRA. This recommendation follows 2010 ACCF/AHA/AATS/ACR/ASA/SCA/SCAI/SIR/STS/SVM  Guidelines for the Diagnosis and Management of Patients with Thoracic Aortic Disease. Circulation. 2010; 121: P379-K240. Aortic aneurysm NOS (ICD10-I71.9)  Will be due in about 2 months for monitoring - Managed by Dr. Dew/vascular specialist  9. Coronary artery disease involving native coronary artery of native heart without angina pectoris No symptoms concerning for ACS or unstable angina  10. Encounter for medication monitoring Labs ordered with last office visit but printed and completed today  11. Aortic atherosclerosis ([place]) Monitoring, continue management with blood pressure control and statin medication  12. Thick nail Continued thick nails with poor hygiene and maintenance to both of his feet, will follow up with podiatry  13. Diabetes mellitus due to underlying condition with diabetic autonomic neuropathy, unspecified whether long term insulin use ([place]) See above -unclear if his diabetes is stable or well controlled he has not been monitoring at all  14. Rhinitis, unspecified type Complaints of postnasal drip and rattling in his chest, his lungs were clear today on exam, is not having any respiratory symptoms not coughing wheezing has no orthopnea has no exertional dyspnea likely patient may be referring to some seasonal allergies again I cannot find any past medications manages other than Flonase - fluticasone (FLONASE) 50 MCG/ACT nasal spray; Place 2 sprays into both nostrils daily.  Dispense: 16 g; Refill: 6 - levocetirizine (XYZAL) 5 MG tablet; Take 1 tablet (5 mg total) by mouth every evening.  Dispense: 30 tablet; Refill: 2    Return for 3 months in office in person for DM/HLD/HTN - bring blood sugar meter.    Greater than 50% of this visit was spent in direct face-to-face counseling, obtaining history and physical, discussing and educating pt on treatment plan.  Total time of this visit was 45 min +.  Remainder of time involved but was not limited to reviewing chart  (  recent and pertinent OV notes and labs), documentation in EMR, and coordinating care and treatment plan.    CCM referral placed to see if pt could benefit from extra resources to help him manage his medications, could benefit from extra community resources support with caring for his wife and for himself, meals, medication assistance and management.  I would also like to refer him to neurology or physical therapy to help assess his gait and cognitive function but just could not communicate very well with patient today he does not want to come into the office would like to stretch out his visits but very concerned that his health and multiple comorbidities with his life changes may be worsening would like to closely monitor him do believe he is slightly resistant right now to multiple visits because of wanting to visit his wife who is far away and wanted to care for her.   Delsa Grana, PA-C 10/15/19 11:12 AM

## 2019-10-15 NOTE — Patient Instructions (Signed)
I will send in a medicine for your pain to try over the next month.  You have to get separate appointment to address new problems like sinus congestion or pain management.

## 2019-10-15 NOTE — Telephone Encounter (Signed)
Left vm pt should contact [facility]

## 2019-10-16 ENCOUNTER — Telehealth: Payer: Self-pay | Admitting: Family Medicine

## 2019-10-16 ENCOUNTER — Other Ambulatory Visit: Payer: Self-pay | Admitting: Family Medicine

## 2019-10-16 DIAGNOSIS — I10 Essential (primary) hypertension: Secondary | ICD-10-CM

## 2019-10-16 LAB — COMPLETE METABOLIC PANEL WITH GFR
AG Ratio: 1.1 (calc) (ref 1.0–2.5)
ALT: 12 U/L (ref 9–46)
AST: 15 U/L (ref 10–35)
Albumin: 3.9 g/dL (ref 3.6–5.1)
Alkaline phosphatase (APISO): 65 U/L (ref 35–144)
BUN: 14 mg/dL (ref 7–25)
CO2: 28 mmol/L (ref 20–32)
Calcium: 10.3 mg/dL (ref 8.6–10.3)
Chloride: 105 mmol/L (ref 98–110)
Creat: 1 mg/dL (ref 0.70–1.18)
GFR, Est African American: 87 mL/min/{1.73_m2} (ref >=60)
GFR, Est Non African American: 75 mL/min/{1.73_m2} (ref >=60)
Globulin: 3.5 g/dL (calc) (ref 1.9–3.7)
Glucose, Bld: 108 mg/dL — ABNORMAL HIGH (ref 65–99)
Potassium: 4.6 mmol/L (ref 3.5–5.3)
Sodium: 139 mmol/L (ref 135–146)
Total Bilirubin: 0.7 mg/dL (ref 0.2–1.2)
Total Protein: 7.4 g/dL (ref 6.1–8.1)

## 2019-10-16 LAB — LIPID PANEL
Cholesterol: 119 mg/dL (ref <200)
HDL: 36 mg/dL — ABNORMAL LOW (ref >=40)
LDL Cholesterol (Calc): 69 mg/dL (calc)
Non-HDL Cholesterol (Calc): 83 mg/dL (calc) (ref <130)
Total CHOL/HDL Ratio: 3.3 (calc) (ref <5.0)
Triglycerides: 62 mg/dL (ref <150)

## 2019-10-16 LAB — HEMOGLOBIN A1C
Hgb A1c MFr Bld: 7.3 % of total Hgb — ABNORMAL HIGH (ref <5.7)
Mean Plasma Glucose: 163 (calc)
eAG (mmol/L): 9 (calc)

## 2019-10-16 LAB — MICROALBUMIN, URINE: Microalb, Ur: 20.5 mg/dL

## 2019-10-16 MED ORDER — LANTUS SOLOSTAR 100 UNIT/ML ~~LOC~~ SOPN: PEN_INJECTOR | SUBCUTANEOUS | 2 refills | Status: DC

## 2019-10-16 MED ORDER — AMLODIPINE BESYLATE 10 MG PO TABS: 10.0000 mg | ORAL_TABLET | Freq: Every day | ORAL | 3 refills | Status: DC

## 2019-10-16 NOTE — Chronic Care Management (AMB) (Signed)
  Chronic Care Management   Note  10/16/2019 Name: Edgar [name] MRN: [id number] DOB: 01-16-1946  Edgar [name] is a 74 y.o. year old male who is a primary care patient of Delsa Grana, [place]. I reached out to [name] by phone today in response to a referral sent by Edgar [name]'s PCP, Delsa Grana PA-C     [name] was given information about Chronic Care Management services today including:  1. CCM service includes personalized support from designated clinical staff supervised by his physician, including individualized plan of care and coordination with other care providers 2. 24/7 contact phone numbers for assistance for urgent and routine care needs. 3. Service will only be billed when office clinical staff spend 20 minutes or more in a month to coordinate care. 4. Only one practitioner may furnish and bill the service in a calendar month. 5. The patient may stop CCM services at any time (effective at the end of the month) by phone call to the office staff. 6. The patient will be responsible for cost sharing (co-pay) of up to 20% of the service fee (after annual deductible is met).  Patient agreed to services and verbal consent obtained.   Follow up plan: Telephone appointment with care management team member scheduled for:10/19/2019  Glenna Durand, LPN Health Advisor, [place] Management ??[email or web address] ??[phone]

## 2019-10-16 NOTE — Telephone Encounter (Signed)
Called [facility] he needs referral

## 2019-10-16 NOTE — Addendum Note (Signed)
Addended by: Docia Furl on: 10/16/2019 11:32 AM   Modules accepted: Orders

## 2019-10-19 ENCOUNTER — Telehealth: Payer: Medicare PPO

## 2019-10-19 ENCOUNTER — Ambulatory Visit: Payer: Self-pay

## 2019-10-19 NOTE — Chronic Care Management (AMB) (Signed)
  Chronic Care Management   Outreach Note   10/19/2019 Name: CHAYANNE STEG MRN: [id number] DOB: [birth year]  Primary Care Provider: Delsa Grana, PA-C Reason for referral : Chronic Care Management   An unsuccessful telephone outreach was attempted today. Mr. Kneisley was referred to the case management team for assistance with care management and care coordination.   A HIPAA compliant voice message was left today requesting a return call.    Follow Up Plan: The care management team will attempt to reach Mr. Cattanach again within the next week.   [facility] [phone]

## 2019-10-19 NOTE — Addendum Note (Signed)
Addended by: Docia Furl on: 10/19/2019 10:37 AM   Modules accepted: Orders

## 2019-10-20 ENCOUNTER — Ambulatory Visit: Payer: Self-pay | Admitting: *Deleted

## 2019-10-20 ENCOUNTER — Telehealth: Payer: Medicare PPO

## 2019-10-20 NOTE — Chronic Care Management (AMB) (Signed)
   Chronic Care Management   Unsuccessful Call Note 10/20/2019 Name: Edgar [name] MRN: [id number] DOB: 09-09-1945  Patient is a 74 year old male who sees Leisa Tapia,PA-C  for primary care. Delsa Grana, PA-C asked the CCM team to consult the patient for [facility].  Referral was placed 10/15/19. Patient's last office visit was 10/15/19.     This social worker was unable to reach patient via telephone today to complete needs assessment. I have left HIPAA compliant voicemail asking patient to return my call. (unsuccessful outreach #1).   Plan: Will follow-up within 7 business days via telephone.     Elliot Gurney, [place] [facility] [phone]

## 2019-10-22 ENCOUNTER — Ambulatory Visit: Payer: Medicare Other

## 2019-10-22 ENCOUNTER — Other Ambulatory Visit: Payer: Self-pay | Admitting: Family Medicine

## 2019-10-22 LAB — HM DIABETES EYE EXAM

## 2019-10-26 ENCOUNTER — Telehealth: Payer: Medicare PPO

## 2019-10-26 ENCOUNTER — Ambulatory Visit: Payer: Self-pay

## 2019-10-26 ENCOUNTER — Telehealth: Payer: Self-pay

## 2019-10-26 NOTE — Chronic Care Management (AMB) (Signed)
  Chronic Care Management   Outreach Note  10/26/2019 Name: Edgar [name] MRN: [id number] DOB: March 14, 1946  Primary Care Provider: Delsa Grana, PA-C Reason for referral : Chronic Care Management   An unsuccessful telephone outreach was attempted today. [name] was referred to the case management team for assistance with care management and care coordination.   A HIPAA compliant voice message was left today requesting a return call.   Follow Up Plan:  The care management team will reach out to [name] again within the next week.   [facility] [phone]

## 2019-10-26 NOTE — Telephone Encounter (Signed)
Lft pt message that he needs a virtual before the paper work can be filled out.[employer] at this time has no available but Roxan Hockey does for Wed. He said that this forms had to be done and turned in by Wed. Forms are with Roselyn Reef the nurse,

## 2019-10-26 NOTE — Telephone Encounter (Signed)
Pt needs a telephone visit for his DMV form, I have the form back here

## 2019-10-27 ENCOUNTER — Ambulatory Visit: Payer: Self-pay | Admitting: *Deleted

## 2019-10-27 ENCOUNTER — Telehealth: Payer: Self-pay | Admitting: *Deleted

## 2019-10-27 NOTE — Telephone Encounter (Signed)
Left vm to schedule an appt.

## 2019-10-27 NOTE — Chronic Care Management (AMB) (Signed)
   Chronic Care Management   Unsuccessful Call Note 10/27/2019 Name: Edgar [name] MRN: [id number] DOB: [birth year]  Patient is a 74 year old male who sees Delsa Grana, [place] for primary care. Delsa Grana, PA-C asked the CCM team to consult the patient for [facility].  Referral was placed 10/15/19. Patient's last office visit was 10/15/19.     This social worker was unable to reach patient via telephone today to complete needs assessment. I have left HIPAA compliant voicemail asking patient to return my call. (unsuccessful outreach #2).   Plan: Will follow-up within 7 business days via telephone.     Elliot Gurney, [place] Administrator, [facility] [phone]

## 2019-10-30 ENCOUNTER — Ambulatory Visit: Payer: Self-pay

## 2019-10-30 ENCOUNTER — Telehealth: Payer: Self-pay | Admitting: *Deleted

## 2019-10-30 ENCOUNTER — Ambulatory Visit: Payer: Self-pay | Admitting: *Deleted

## 2019-10-30 ENCOUNTER — Telehealth: Payer: Medicare PPO

## 2019-10-30 NOTE — Chronic Care Management (AMB) (Signed)
  Chronic Care Management   Outreach Note  10/30/2019 Name: HILMON GRADNEY MRN: [id number] DOB: 1945-12-24  Primary Care Provider: Delsa Grana, PA-C Reason for referral : Chronic Care Management   An unsuccessful telephone outreach was attempted today. Mr. Suppes was referred to the case management team for assistance with care management and care coordination.   Left a HIPAA compliant voice message requesting a return call.   Follow Up Plan: The care management team will reach out to Mr. Skov again within the next two weeks.    [facility] [phone]

## 2019-10-30 NOTE — Chronic Care Management (AMB) (Signed)
  Chronic Care Management   Social Work Note  10/30/2019 Name: Edgar [name] MRN: [id number] DOB: [birth year]  Edgar [name] is a 74 y.o. year old male who sees Delsa Grana, [place] for primary care. The CCM team was consulted for assistance with [employer] .   Return call from patient stating that he works for the [employer] and was unable to take calls due to his work schedule. Patient stated that he will be available next Wednesday between 11 and 11:30am and would welcome a call then. RNCM notified.  SDOH (Social Determinants of Health) assessments performed: No     Outpatient Encounter Medications as of 10/30/2019  Medication Sig Note  . acetaminophen (TYLENOL) 500 MG tablet Take 500 mg by mouth every 6 (six) hours as needed. 12/07/2016: PRN  . amLODipine (NORVASC) 10 MG tablet Take 1 tablet (10 mg total) by mouth daily.   Marland Kitchen atorvastatin (LIPITOR) 40 MG tablet Take 1 tablet (40 mg total) by mouth at bedtime.   . Blood Glucose Monitoring Suppl (ACCU-CHEK AVIVA PLUS) w/Device KIT 1 Device by Does not apply route 2 (two) times daily. Dx E11.21 Check BG BID   . Blood Glucose Monitoring Suppl (ONE TOUCH ULTRA 2) w/Device KIT Check fingerstick blood sugars three times a day; DX E11.65, LON 99 months   . cholecalciferol (VITAMIN D) 1000 units tablet Take 1,000 Units by mouth 2 (two) times daily.    . Fish Oil-Cholecalciferol (FISH OIL + D3) 1200-1000 MG-UNIT CAPS Take 1,200 mg by mouth daily.   . fluticasone (FLONASE) 50 MCG/ACT nasal spray Place 2 sprays into both nostrils daily.   Marland Kitchen glucose blood (ACCU-CHEK AVIVA PLUS) test strip Use as instructed D:E11.21 Check BG bid   . glucose blood (ONE TOUCH ULTRA TEST) test strip USE AS INSTRUCTEDDX E11.65, LON 99 MONTHS CHECK FINGERSTICK BLOOD SUGARS THREE TIMES A DAY   . HUMALOG KWIKPEN 100 UNIT/ML KwikPen INJECT 10 UNITS TOTAL INTO THE SKIN 3 (THREE) TIMES DAILY.   Marland Kitchen insulin glargine (LANTUS SOLOSTAR) 100 UNIT/ML Solostar Pen  INJECT 60 UNITS INTO THE SKIN DAILY. OR AS DIRECTED BY YOUR DOCTOR   . Insulin Pen Needle (B-D UF III MINI PEN NEEDLES) 31G X 5 MM MISC USE ONCE A DAY WITH LONG-ACTING INSULIN, AND UP TO 3X A DAY WITH SHORT-ACTING INSULIN   . JARDIANCE 25 MG TABS tablet TAKE 1 TABLET BY MOUTH DAILY. (HIGHER DOSE)   . Lancets (ACCU-CHEK MULTICLIX) lancets Use as instructed Dx Ell.21, Check BG bid   . Lancets (ONETOUCH ULTRASOFT) lancets Check fingerstick blood sugar three times a day; E11.65, LON 99 months   . levocetirizine (XYZAL) 5 MG tablet Take 1 tablet (5 mg total) by mouth every evening.   Marland Kitchen losartan (COZAAR) 100 MG tablet Take 1 tablet (100 mg total) by mouth daily.    No facility-administered encounter medications on file as of 10/30/2019.    Goals Addressed   None     Follow Up Plan: SW will follow up with patient by phone over the next 7-14 business days  [place], [place] Worker  [facility] [phone]

## 2019-10-30 NOTE — Chronic Care Management (AMB) (Signed)
   Chronic Care Management   Unsuccessful Call Note 10/30/2019 Name: Edgar [name] MRN: [id number] DOB: 04/24/1946  Patient  is a 74 year old male who sees Delsa Grana, [place]  for primary care. Delsa Grana, PA-C asked the CCM team to consult the patient for [facility]. Referral was placed 10/15/19. Patient's last office visit was 10/15/19.     This social worker was unable to reach patient via telephone today to introduce CCM services and obtain consent.. I have left HIPAA compliant voicemail asking patient to return my call. (unsuccessful outreach #3).   Plan: Will follow-up within 7 business days via telephone.     Elliot Gurney, [place] Administrator, [facility] [phone]

## 2019-11-09 ENCOUNTER — Ambulatory Visit: Payer: Self-pay | Admitting: *Deleted

## 2019-11-09 ENCOUNTER — Telehealth: Payer: Self-pay | Admitting: *Deleted

## 2019-11-09 NOTE — Chronic Care Management (AMB) (Signed)
   Chronic Care Management   Unsuccessful Call Note 11/09/2019 Name: Edgar [name] MRN: [id number] DOB: Feb 20, 1946  Patient is a 74 year old male who sees Delsa Grana, [place] for primary care. Delsa Grana, PA-C asked the CCM team to consult the patient for community resources.   This social worker was unable to reach patient via telephone today for follow up call. I have left HIPAA compliant voicemail asking patient to return my call. (unsuccessful outreach #1).   Plan: Will follow-up within 7 business days via telephone.     Elliot Gurney, [place] Administrator, [facility] [phone]

## 2019-11-10 ENCOUNTER — Ambulatory Visit (INDEPENDENT_AMBULATORY_CARE_PROVIDER_SITE_OTHER): Payer: Medicare PPO | Admitting: *Deleted

## 2019-11-10 DIAGNOSIS — Z794 Long term (current) use of insulin: Secondary | ICD-10-CM

## 2019-11-10 DIAGNOSIS — I10 Essential (primary) hypertension: Secondary | ICD-10-CM | POA: Diagnosis not present

## 2019-11-10 DIAGNOSIS — E1121 Type 2 diabetes mellitus with diabetic nephropathy: Secondary | ICD-10-CM | POA: Diagnosis not present

## 2019-11-11 ENCOUNTER — Ambulatory Visit: Payer: Medicare PPO | Admitting: Family Medicine

## 2019-11-11 ENCOUNTER — Encounter: Payer: Self-pay | Admitting: Family Medicine

## 2019-11-11 ENCOUNTER — Other Ambulatory Visit: Payer: Self-pay

## 2019-11-11 VITALS — BP 132/70 | HR 82 | Temp 98.1°F | Resp 14 | Ht 72.0 in | Wt 281.4 lb

## 2019-11-11 DIAGNOSIS — Z024 Encounter for examination for driving license: Secondary | ICD-10-CM

## 2019-11-11 DIAGNOSIS — E1121 Type 2 diabetes mellitus with diabetic nephropathy: Secondary | ICD-10-CM

## 2019-11-11 DIAGNOSIS — Z794 Long term (current) use of insulin: Secondary | ICD-10-CM | POA: Diagnosis not present

## 2019-11-11 NOTE — Patient Instructions (Signed)
Thank you allowing the Chronic Care Management Team to be a part of your care! It was a pleasure speaking with you today!  1. Please anticipate a call from the pharmacy team regarding medication assistance  CCM (Chronic Care Management) Team   Neldon Labella  RN, BSN Nurse Care Coordinator  [phone]   [place], [place] Social Worker [phone]  Goals Addressed            This Visit's Progress   . "I need assistance getting my lantus" (pt-stated)       CARE PLAN ENTRY (see longtitudinal plan of care for additional care plan information)  Current Barriers:  Marland Kitchen Medication procurement  Clinical Social Work Clinical Goal(s):  Marland Kitchen Over the next 30 days, patient will work with the pharmacy team  to address needs related to medication costs/needs  Interventions: . Patient interviewed and appropriate needs assessments performed . Provided patient with information about possible medication assistance with the pharmacy team . Discussed plans with patient for ongoing care management follow up and provided patient with direct contact information for care management team . Collaborated with RN Case Manager lead re: referral to the pharmacy team  for possible medication assistance   Patient Self Care Activities:  . Patient verbalizes understanding of plan to work with the th pharmacy team regarding possible medication assistance . Attends all scheduled provider appointments . Has difficulty affording medications, specifically Lantus  Initial goal documentation         The patient verbalized understanding of instructions provided today and declined a print copy of patient instruction materials.   Telephone follow up appointment with care management team member scheduled for:  11/17/19

## 2019-11-11 NOTE — Chronic Care Management (AMB) (Signed)
Chronic Care Management   Social Work Note  11/11/2019 Name: Edgar [name] MRN: [id number] DOB: Aug 03, 1946  Edgar [name] is a 74 y.o. year old male who sees Delsa Grana, [place] for primary care. The CCM team was consulted for assistance with [employer] .   Patient referred to this social worker to assist with caregiver resources. Patient contacted to discuss current needs. Per patient, his daughter provides assistance with putting her on dialysis at night if needed and he assists her in the morning. Patient further reports that he and his ife receive Meals on Wheels. Patient did discuss need for assistance with his Lantus medication. This Education officer, museum discussed plan to refer patient to the pharmacy to assist with any possible medication assistance.  SDOH (Social Determinants of Health) assessments performed: Yes SDOH Interventions     Most Recent Value  SDOH Interventions  SDOH Interventions for the Following Domains  Financial Strain  Financial Strain Interventions  Other (Comment) [referral to pharmacist for medication assistance]        Outpatient Encounter Medications as of 11/10/2019  Medication Sig Note  . acetaminophen (TYLENOL) 500 MG tablet Take 500 mg by mouth every 6 (six) hours as needed. 12/07/2016: PRN  . amLODipine (NORVASC) 10 MG tablet Take 1 tablet (10 mg total) by mouth daily.   Marland Kitchen atorvastatin (LIPITOR) 40 MG tablet Take 1 tablet (40 mg total) by mouth at bedtime.   . Blood Glucose Monitoring Suppl (ACCU-CHEK AVIVA PLUS) w/Device KIT 1 Device by Does not apply route 2 (two) times daily. Dx E11.21 Check BG BID   . Blood Glucose Monitoring Suppl (ONE TOUCH ULTRA 2) w/Device KIT Check fingerstick blood sugars three times a day; DX E11.65, LON 99 months   . cholecalciferol (VITAMIN D) 1000 units tablet Take 1,000 Units by mouth 2 (two) times daily.    . Fish Oil-Cholecalciferol (FISH OIL + D3) 1200-1000 MG-UNIT CAPS Take 1,200 mg by mouth daily.   . fluticasone  (FLONASE) 50 MCG/ACT nasal spray Place 2 sprays into both nostrils daily.   Marland Kitchen glucose blood (ACCU-CHEK AVIVA PLUS) test strip Use as instructed D:E11.21 Check BG bid   . glucose blood (ONE TOUCH ULTRA TEST) test strip USE AS INSTRUCTEDDX E11.65, LON 99 MONTHS CHECK FINGERSTICK BLOOD SUGARS THREE TIMES A DAY   . HUMALOG KWIKPEN 100 UNIT/ML KwikPen INJECT 10 UNITS TOTAL INTO THE SKIN 3 (THREE) TIMES DAILY.   Marland Kitchen insulin glargine (LANTUS SOLOSTAR) 100 UNIT/ML Solostar Pen INJECT 60 UNITS INTO THE SKIN DAILY. OR AS DIRECTED BY YOUR DOCTOR   . Insulin Pen Needle (B-D UF III MINI PEN NEEDLES) 31G X 5 MM MISC USE ONCE A DAY WITH LONG-ACTING INSULIN, AND UP TO 3X A DAY WITH SHORT-ACTING INSULIN   . JARDIANCE 25 MG TABS tablet TAKE 1 TABLET BY MOUTH DAILY. (HIGHER DOSE)   . Lancets (ACCU-CHEK MULTICLIX) lancets Use as instructed Dx Ell.21, Check BG bid   . Lancets (ONETOUCH ULTRASOFT) lancets Check fingerstick blood sugar three times a day; E11.65, LON 99 months   . levocetirizine (XYZAL) 5 MG tablet Take 1 tablet (5 mg total) by mouth every evening.   Marland Kitchen losartan (COZAAR) 100 MG tablet Take 1 tablet (100 mg total) by mouth daily.    No facility-administered encounter medications on file as of 11/10/2019.    Goals Addressed            This Visit's Progress   . "I need assistance getting my lantus" (pt-stated)  CARE PLAN ENTRY (see longtitudinal plan of care for additional care plan information)  Current Barriers:  Marland Kitchen Medication procurement  Clinical Social Work Clinical Goal(s):  Marland Kitchen Over the next 30 days, patient will work with the pharmacy team  to address needs related to medication costs/needs  Interventions: . Patient interviewed and appropriate needs assessments performed . Provided patient with information about possible medication assistance with the pharmacy team . Discussed plans with patient for ongoing care management follow up and provided patient with direct contact information  for care management team . Collaborated with RN Case Manager lead re: referral to the pharmacy team  for possible medication assistance   Patient Self Care Activities:  . Patient verbalizes understanding of plan to work with the th pharmacy team regarding possible medication assistance . Attends all scheduled provider appointments . Has difficulty affording medications, specifically Lantus  Initial goal documentation        Follow Up Plan: SW will follow up with patient by phone over the next 7-14 business days  [place], [place] Worker  [facility] [phone]

## 2019-11-11 NOTE — Progress Notes (Signed)
Patient ID: YOSMAR RYKER, male    DOB: 1946-03-11, 74 y.o.   MRN: [id number]  PCP: Delsa Grana, PA-C  Chief Complaint  Patient presents with  . paperwork    Subjective:   ISRAEL WERTS is a 74 y.o. male, presents to clinic with CC of the following:  Here for [facility] forms reviewing DM, cardiac conditions, MSK conditions so he can get his drivers license again this year. No interval change from last visit DM still well controlled no hypoglycemia HTN/HLD well controlled, compliant with meds, no near syncope, no exertional sx.  Last visit noted antalgic gait from past femur surgery has pain/arthritis that is intermittent worse in winter, with warmer weather the pain is easing up.  Only uses tylenol.  Does have limited left leg ROM.  No problems with right leg or foot - good sensation with last visit and DM foot exam  Forms completed  Patient Active Problem List   Diagnosis Date Noted  . Coronary artery disease 10/25/2017  . 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[place]) 05/14/2017  . BRBPR (bright red blood per rectum) 12/07/2016  . History of colonoscopy with polypectomy 12/07/2016  . Medication monitoring encounter 12/07/2016  . Fatty liver 09/11/2016  . Aneurysm of aorta ([place]) 09/11/2016  . 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 Tinea pedis 06/25/2016  . Closed intertrochanteric fracture of left hip, sequela 05/30/2016  . Hx of pulmonary embolus 05/18/2016  . Essential hypertension, benign 12/23/2015  . Hyperlipidemia 12/23/2015  . Hx of tobacco use, presenting hazards to health 12/23/2015  . Morbid obesity ([place]) 12/23/2015  . Type 2 diabetes mellitus with diabetic nephropathy, 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[place]) 01/25/2015      Current Outpatient Medications:  .  acetaminophen (TYLENOL) 500 MG tablet, Take 500 mg by mouth every 6 (six) hours as needed., Disp: , Rfl:  .  amLODipine (NORVASC) 10 MG tablet, Take 1  tablet (10 mg total) by mouth daily., Disp: 90 tablet, Rfl: 3 .  atorvastatin (LIPITOR) 40 MG tablet, Take 1 tablet (40 mg total) by mouth at bedtime., Disp: 90 tablet, Rfl: 3 .  cholecalciferol (VITAMIN D) 1000 units tablet, Take 1,000 Units by mouth 2 (two) times daily. , Disp: , Rfl:  .  Fish Oil-Cholecalciferol (FISH OIL + D3) 1200-1000 MG-UNIT CAPS, Take 1,200 mg by mouth daily., Disp: 90 capsule, Rfl: 3 .  fluticasone (FLONASE) 50 MCG/ACT nasal spray, Place 2 sprays into both nostrils daily., Disp: 16 g, Rfl: 6 .  HUMALOG KWIKPEN 100 UNIT/ML KwikPen, INJECT 10 UNITS TOTAL INTO THE SKIN 3 (THREE) TIMES DAILY., Disp: 15 mL, Rfl: 6 .  insulin glargine (LANTUS SOLOSTAR) 100 UNIT/ML Solostar Pen, INJECT 60 UNITS INTO THE SKIN DAILY. OR AS DIRECTED BY YOUR DOCTOR, Disp: 15 mL, Rfl: 2 .  Insulin Pen Needle (B-D UF III MINI PEN NEEDLES) 31G X 5 MM MISC, USE ONCE A DAY WITH LONG-ACTING INSULIN, AND UP TO 3X A DAY WITH SHORT-ACTING INSULIN, Disp: 300 each, Rfl: 3 .  JARDIANCE 25 MG TABS tablet, TAKE 1 TABLET BY MOUTH DAILY. (HIGHER DOSE), Disp: 90 tablet, Rfl: 3 .  levocetirizine (XYZAL) 5 MG tablet, Take 1 tablet (5 mg total) by mouth every evening., Disp: 30 tablet, Rfl: 2 .  losartan (COZAAR) 100 MG tablet, Take 1 tablet (100 mg total) by mouth daily., Disp: 90 tablet, Rfl: 2 .  Blood Glucose Monitoring Suppl (ACCU-CHEK AVIVA PLUS) w/Device KIT,  1 Device by Does not apply route 2 (two) times daily. Dx E11.21 Check BG BID, Disp: 1 kit, Rfl: 0 .  Blood Glucose Monitoring Suppl (ONE TOUCH ULTRA 2) w/Device KIT, Check fingerstick blood sugars three times a day; DX E11.65, LON 99 months, Disp: 1 each, Rfl: 0 .  glucose blood (ACCU-CHEK AVIVA PLUS) test strip, Use as instructed D:E11.21 Check BG bid, Disp: 100 each, Rfl: 12 .  glucose blood (ONE TOUCH ULTRA TEST) test strip, USE AS INSTRUCTEDDX E11.65, LON 99 MONTHS CHECK FINGERSTICK BLOOD SUGARS THREE TIMES A DAY, Disp: 300 each, Rfl: 11 .  Lancets  (ACCU-CHEK MULTICLIX) lancets, Use as instructed Dx Ell.21, Check BG bid, Disp: 100 each, Rfl: 12 .  Lancets (ONETOUCH ULTRASOFT) lancets, Check fingerstick blood sugar three times a day; E11.65, LON 99 months, Disp: 100 each, Rfl: 12   No Known Allergies   Family History  Problem Relation Age of Onset  . Cancer Mother   . Diabetes Mother   . Heart disease Father   . Alcohol abuse Brother      Social History   Socioeconomic History  . Marital status: Married    Spouse name: Not on file  . Number of children: Not on file  . Years of education: Not on file  . Highest education level: Not on file  Occupational History  . Not on file  Tobacco Use  . Smoking status: Former Smoker    Types: Cigarettes    Quit date: 09/01/1978    Years since quitting: 41.2  . Smokeless tobacco: Never Used  Substance and Sexual Activity  . Alcohol use: No    Alcohol/week: 0.0 standard drinks  . Drug use: No  . Sexual activity: Not Currently  Other Topics Concern  . Not on file  Social History Narrative  . Not on file   Social Determinants of Health   Financial Resource Strain: High Risk  . Difficulty of Paying Living Expenses: Hard  Food Insecurity: No Food Insecurity  . Worried About Charity fundraiser in the Last Year: Never true  . Ran Out of Food in the Last Year: Never true  Transportation Needs: No Transportation Needs  . Lack of Transportation (Medical): No  . Lack of Transportation (Non-Medical): No  Physical Activity: Insufficiently Active  . Days of Exercise per Week: 5 days  . Minutes of Exercise per Session: 20 min  Stress: No Stress Concern Present  . Feeling of Stress : Not at all  Social Connections: Slightly Isolated  . Frequency of Communication with Friends and Family: More than three times a week  . Frequency of Social Gatherings with Friends and Family: Three times a week  . Attends Religious Services: 1 to 4 times per year  . Active Member of Clubs or  Organizations: No  . Attends Archivist Meetings: Never  . Marital Status: Married  Human resources officer Violence:   . Fear of Current or Ex-Partner:   . Emotionally Abused:   Marland Kitchen Physically Abused:   . Sexually Abused:     Chart Review Today: I personally reviewed active problem list, medication list, allergies, family history, social history, health maintenance, notes from last encounter, lab results, imaging with the patient/caregiver today.   Review of Systems 10 Systems reviewed and are negative for acute change except as noted in the HPI.     Objective:   Vitals:   11/11/19 0823  BP: 132/70  Pulse: 82  Resp: 14  Temp: 98.1 F (  36.7 C)  SpO2: 92%  Weight: 281 lb 6.4 oz (127.6 kg)  Height: 6' (1.829 m)    Body mass index is 38.16 kg/m.  Physical Exam Vitals and nursing note reviewed.  Constitutional:      General: He is not in acute distress.    Appearance: Normal appearance. He is well-developed. He is obese. He is not ill-appearing, toxic-appearing or diaphoretic.     Interventions: Face mask in place.     Comments: Elderly male, NAD, HOH  HENT:     Head: Normocephalic and atraumatic.     Jaw: No trismus.     Right Ear: External ear normal. Decreased hearing noted.     Left Ear: External ear normal. Decreased hearing noted.     Ears:     Comments: HOH- did not have hearing aids in Eyes:     General: Lids are normal. No scleral icterus.    Conjunctiva/sclera: Conjunctivae normal.     Pupils: Pupils are equal, round, and reactive to light.  Neck:     Trachea: Trachea and phonation normal. No tracheal deviation.  Cardiovascular:     Rate and Rhythm: Normal rate and regular rhythm.     Pulses: Normal pulses.          Radial pulses are 2+ on the right side and 2+ on the left side.       Posterior tibial pulses are 2+ on the right side and 2+ on the left side.     Heart sounds: Normal heart sounds.     Comments: No LE pitting edema Pulmonary:      Effort: Pulmonary effort is normal. No respiratory distress.     Breath sounds: Normal breath sounds.  Musculoskeletal:     Cervical back: Normal range of motion and neck supple.  Skin:    General: Skin is warm and dry.     Coloration: Skin is not jaundiced.     Findings: No rash.     Nails: There is no clubbing.  Neurological:     Mental Status: He is alert.     Cranial Nerves: No dysarthria or facial asymmetry.     Motor: No tremor or abnormal muscle tone.     Gait: Gait abnormal (antalgic gait).  Psychiatric:        Mood and Affect: Mood normal.        Speech: Speech normal.        Behavior: Behavior normal. Behavior is cooperative.        Cognition and Memory: Memory is impaired.      Results for orders placed or performed in visit on 10/26/19  HM DIABETES EYE EXAM  Result Value Ref Range   HM Diabetic Eye Exam No Retinopathy No Retinopathy        Assessment & Plan:      ICD-10-CM   1. Type 2 diabetes mellitus with diabetic nephropathy, 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HCC)  E11.21    Z79.4    continues to be well controlled, working with [place] and/or community resources to help him afford meds, well controlled IDDM  2. Encounter for examination for driving license  B01.7    completion of forms today for DMV for DM and cardiac conditions/HTN/HLD, pt HOH but passed vision.  Well controlled IDDM     encounter today took more than 30 + min to review conditions, meds, past med hx with patient and complete pt's forms   Delsa Grana, PA-C 11/11/19 8:43  AM

## 2019-11-12 ENCOUNTER — Ambulatory Visit: Payer: Self-pay | Admitting: *Deleted

## 2019-11-12 DIAGNOSIS — E1121 Type 2 diabetes mellitus with diabetic nephropathy: Secondary | ICD-10-CM

## 2019-11-12 DIAGNOSIS — Z794 Long term (current) use of insulin: Secondary | ICD-10-CM

## 2019-11-12 DIAGNOSIS — I10 Essential (primary) hypertension: Secondary | ICD-10-CM

## 2019-11-12 NOTE — Patient Instructions (Signed)
Visit Information  Goals Addressed            This Visit's Progress   . "I need assistance getting my lantus" (pt-stated)       CARE PLAN ENTRY (see longtitudinal plan of care for additional care plan information)  Current Barriers:  Marland Kitchen Medication procurement needs in patient with DMII, Diabetic nephropathy, and HTN at risk for worsening DM and renal disease state if unable to obtain medications and adhere to prescribed regimen  Clinical Social Work Clinical Goal(s):  Marland Kitchen Over the next 30 days, patient will work with the pharmacy team  to address needs related to medication costs/needs  Interventions: . Patient interviewed and appropriate needs assessments performed . Provided patient with information about possible medication assistance with the pharmacy team . Discussed plans with patient for ongoing care management follow up and provided patient with direct contact information for care management team . Collaborated with RN Case Manager lead re: referral to the pharmacy team  for possible medication assistance  . RNCM: referred to [place] team for help with medication assistance   Patient Self Care Activities:  . Patient verbalizes understanding of plan to work with the th pharmacy team regarding possible medication assistance . Attends all scheduled provider appointments . Has difficulty affording medications, specifically Lantus  Initial goal documentation        The patient verbalized understanding of instructions provided today and declined a print copy of patient instruction materials.   The care management team will reach out to the patient again over the next 14 days.   [place] Management Coordinator Direct Dial:  [phone]  Fax: [phone]

## 2019-11-12 NOTE — Chronic Care Management (AMB) (Signed)
Chronic Care Management   Follow Up Note   11/12/2019 Name: Edgar [name] MRN: [id number] DOB: [birth year]  Referred by: Delsa Grana, PA-C Reason for referral : Care Coordination (Medication Assistance)   Edgar [name] is a 74 y.o. year old male who is a primary care patient of Delsa Grana, [place]. The CCM team was consulted for assistance with chronic disease management and care coordination needs.    Review of patient status, including review of consultants reports, relevant laboratory and other test results, and collaboration with appropriate care team members and the patient's provider was performed as part of comprehensive patient evaluation and provision of chronic care management services.    SDOH (Social Determinants of Health) assessments performed: Yes See Care Plan activities for detailed interventions related to SDOH)  SDOH Interventions     Most Recent Value  SDOH Interventions  Financial Strain Interventions  Other (Comment) [Central Pharmacy referral]       Outpatient Encounter Medications as of 11/12/2019  Medication Sig Note  . acetaminophen (TYLENOL) 500 MG tablet Take 500 mg by mouth every 6 (six) hours as needed. 12/07/2016: PRN  . amLODipine (NORVASC) 10 MG tablet Take 1 tablet (10 mg total) by mouth daily.   Marland Kitchen atorvastatin (LIPITOR) 40 MG tablet Take 1 tablet (40 mg total) by mouth at bedtime.   . Blood Glucose Monitoring Suppl (ACCU-CHEK AVIVA PLUS) w/Device KIT 1 Device by Does not apply route 2 (two) times daily. Dx E11.21 Check BG BID   . Blood Glucose Monitoring Suppl (ONE TOUCH ULTRA 2) w/Device KIT Check fingerstick blood sugars three times a day; DX E11.65, LON 99 months   . cholecalciferol (VITAMIN D) 1000 units tablet Take 1,000 Units by mouth 2 (two) times daily.    . Fish Oil-Cholecalciferol (FISH OIL + D3) 1200-1000 MG-UNIT CAPS Take 1,200 mg by mouth daily.   . fluticasone (FLONASE) 50 MCG/ACT nasal spray Place 2 sprays into both nostrils daily.   Marland Kitchen  glucose blood (ACCU-CHEK AVIVA PLUS) test strip Use as instructed D:E11.21 Check BG bid   . glucose blood (ONE TOUCH ULTRA TEST) test strip USE AS INSTRUCTEDDX E11.65, LON 99 MONTHS CHECK FINGERSTICK BLOOD SUGARS THREE TIMES A DAY   . HUMALOG KWIKPEN 100 UNIT/ML KwikPen INJECT 10 UNITS TOTAL INTO THE SKIN 3 (THREE) TIMES DAILY.   Marland Kitchen insulin glargine (LANTUS SOLOSTAR) 100 UNIT/ML Solostar Pen INJECT 60 UNITS INTO THE SKIN DAILY. OR AS DIRECTED BY YOUR DOCTOR   . Insulin Pen Needle (B-D UF III MINI PEN NEEDLES) 31G X 5 MM MISC USE ONCE A DAY WITH LONG-ACTING INSULIN, AND UP TO 3X A DAY WITH SHORT-ACTING INSULIN   . JARDIANCE 25 MG TABS tablet TAKE 1 TABLET BY MOUTH DAILY. (HIGHER DOSE)   . Lancets (ACCU-CHEK MULTICLIX) lancets Use as instructed Dx Ell.21, Check BG bid   . Lancets (ONETOUCH ULTRASOFT) lancets Check fingerstick blood sugar three times a day; E11.65, LON 99 months   . levocetirizine (XYZAL) 5 MG tablet Take 1 tablet (5 mg total) by mouth every evening.   Marland Kitchen losartan (COZAAR) 100 MG tablet Take 1 tablet (100 mg total) by mouth daily.    No facility-administered encounter medications on file as of 11/12/2019.     Objective:   Goals Addressed            This Visit's Progress   . "I need assistance getting my lantus" (pt-stated)       CARE PLAN ENTRY (see longtitudinal plan of care for  additional care plan information)  Current Barriers:  Marland Kitchen Medication procurement needs in patient with DMII, Diabetic nephropathy, and HTN at risk for worsening DM and renal disease state if unable to obtain medications and adhere to prescribed regimen  Clinical Social Work Clinical Goal(s):  Marland Kitchen Over the next 30 days, patient will work with the pharmacy team  to address needs related to medication costs/needs  Interventions: . Patient interviewed and appropriate needs assessments performed . Provided patient with information about possible medication assistance with the pharmacy team . Discussed  plans with patient for ongoing care management follow up and provided patient with direct contact information for care management team . Collaborated with RN Case Manager lead re: referral to the pharmacy team  for possible medication assistance  . RNCM: referred to [facility] team for help with medication assistance   Patient Self Care Activities:  . Patient verbalizes understanding of plan to work with the th pharmacy team regarding possible medication assistance . Attends all scheduled provider appointments . Has difficulty affording medications, specifically Lantus  Initial goal documentation         Plan: Central Pharmacy team follow up.  The care management team will reach out to the patient again over the next 14 days.    [facility] Management Coordinator Direct Dial:  [phone]  Fax: [phone]

## 2019-11-12 NOTE — Chronic Care Management (AMB) (Signed)
   Chronic Care Management   Unsuccessful Call Note 11/12/2019 Name: Edgar [name] MRN: [id number] DOB: 1945-12-25  Patient is a 74 year old male who sees Delsa Grana, [place] for primary care. Delsa Grana, PA-C asked the CCM team to consult the patient for [facility].    I was unable to reach patient's daughter today via telephone today to discuss pharmacy referral as requested. I have left HIPAA compliant voicemail asking patient to return my call. (unsuccessful outreach #1).   Plan: Will follow-up within 7 business days via telephone.     Elliot Gurney, [place] Administrator[facility] [phone]

## 2019-11-13 ENCOUNTER — Telehealth: Payer: Medicare PPO

## 2019-11-13 ENCOUNTER — Ambulatory Visit: Payer: Self-pay

## 2019-11-13 NOTE — Chronic Care Management (AMB) (Signed)
  Chronic Care Management   Outreach Note  11/13/2019 Name: MAYA ORIA MRN: [id number] DOB: 02/23/1946  Primary Care Provider: Delsa Grana, PA-C Reason for referral : Chronic Care Management   An unsuccessful telephone outreach was attempted today. Per chart review, the CCM LCSW was able to reach and engage him earlier this week.  A HIPAA compliant voice message was left today requesting a return call.   Follow Up Plan -Will attempt to reach Mr. Amici again next week.    [facility] Care [facility] [phone]   SIGNATURE

## 2019-11-16 ENCOUNTER — Ambulatory Visit: Payer: Medicare PPO | Admitting: Podiatry

## 2019-11-17 ENCOUNTER — Encounter (INDEPENDENT_AMBULATORY_CARE_PROVIDER_SITE_OTHER): Payer: Self-pay | Admitting: Vascular Surgery

## 2019-11-17 ENCOUNTER — Encounter: Payer: Self-pay | Admitting: *Deleted

## 2019-11-17 ENCOUNTER — Encounter (INDEPENDENT_AMBULATORY_CARE_PROVIDER_SITE_OTHER): Payer: Self-pay

## 2019-11-17 ENCOUNTER — Ambulatory Visit: Payer: Self-pay | Admitting: *Deleted

## 2019-11-17 ENCOUNTER — Ambulatory Visit (INDEPENDENT_AMBULATORY_CARE_PROVIDER_SITE_OTHER): Payer: Medicare PPO | Admitting: Vascular Surgery

## 2019-11-17 ENCOUNTER — Other Ambulatory Visit: Payer: Self-pay

## 2019-11-17 VITALS — BP 133/76 | HR 66 | Ht 72.0 in | Wt 280.0 lb

## 2019-11-17 DIAGNOSIS — E1121 Type 2 diabetes mellitus with diabetic nephropathy: Secondary | ICD-10-CM

## 2019-11-17 DIAGNOSIS — I10 Essential (primary) hypertension: Secondary | ICD-10-CM

## 2019-11-17 DIAGNOSIS — I712 Thoracic aortic aneurysm, without rupture, unspecified: Secondary | ICD-10-CM

## 2019-11-17 DIAGNOSIS — Z794 Long term (current) use of insulin: Secondary | ICD-10-CM

## 2019-11-17 DIAGNOSIS — E782 Mixed hyperlipidemia: Secondary | ICD-10-CM | POA: Diagnosis not present

## 2019-11-17 NOTE — Assessment & Plan Note (Signed)
blood glucose control important in reducing the progression of atherosclerotic disease. Also, involved in wound healing. On appropriate medications.  

## 2019-11-17 NOTE — Chronic Care Management (AMB) (Signed)
Chronic Care Management    Clinical Social Work Follow Up Note  11/17/2019 Name: Edgar [name] MRN: [id number] DOB: 02/21/1946  Edgar [name] is a 74 y.o. year old male who is a primary care patient of Delsa Grana, [place]. The CCM team was consulted for assistance with [employer] .   Review of patient status, including review of consultants reports, other relevant assessments, and collaboration with appropriate care team members and the patient's provider was performed as part of comprehensive patient evaluation and provision of chronic care management services.    SDOH (Social Determinants of Health) assessments performed: No    Outpatient Encounter Medications as of 11/17/2019  Medication Sig Note  . acetaminophen (TYLENOL) 500 MG tablet Take 500 mg by mouth every 6 (six) hours as needed. 12/07/2016: PRN  . amLODipine (NORVASC) 10 MG tablet Take 1 tablet (10 mg total) by mouth daily.   Marland Kitchen atorvastatin (LIPITOR) 40 MG tablet Take 1 tablet (40 mg total) by mouth at bedtime.   . Blood Glucose Monitoring Suppl (ACCU-CHEK AVIVA PLUS) w/Device KIT 1 Device by Does not apply route 2 (two) times daily. Dx E11.21 Check BG BID   . Blood Glucose Monitoring Suppl (ONE TOUCH ULTRA 2) w/Device KIT Check fingerstick blood sugars three times a day; DX E11.65, LON 99 months   . cholecalciferol (VITAMIN D) 1000 units tablet Take 1,000 Units by mouth 2 (two) times daily.    . Fish Oil-Cholecalciferol (FISH OIL + D3) 1200-1000 MG-UNIT CAPS Take 1,200 mg by mouth daily.   . fluticasone (FLONASE) 50 MCG/ACT nasal spray Place 2 sprays into both nostrils daily.   Marland Kitchen glucose blood (ACCU-CHEK AVIVA PLUS) test strip Use as instructed D:E11.21 Check BG bid   . glucose blood (ONE TOUCH ULTRA TEST) test strip USE AS INSTRUCTEDDX E11.65, LON 99 MONTHS CHECK FINGERSTICK BLOOD SUGARS THREE TIMES A DAY   . HUMALOG KWIKPEN 100 UNIT/ML KwikPen INJECT 10 UNITS TOTAL INTO THE SKIN 3 (THREE) TIMES DAILY.   Marland Kitchen insulin  glargine (LANTUS SOLOSTAR) 100 UNIT/ML Solostar Pen INJECT 60 UNITS INTO THE SKIN DAILY. OR AS DIRECTED BY YOUR DOCTOR   . Insulin Pen Needle (B-D UF III MINI PEN NEEDLES) 31G X 5 MM MISC USE ONCE A DAY WITH LONG-ACTING INSULIN, AND UP TO 3X A DAY WITH SHORT-ACTING INSULIN   . JARDIANCE 25 MG TABS tablet TAKE 1 TABLET BY MOUTH DAILY. (HIGHER DOSE)   . Lancets (ACCU-CHEK MULTICLIX) lancets Use as instructed Dx Ell.21, Check BG bid   . Lancets (ONETOUCH ULTRASOFT) lancets Check fingerstick blood sugar three times a day; E11.65, LON 99 months   . levocetirizine (XYZAL) 5 MG tablet Take 1 tablet (5 mg total) by mouth every evening.   Marland Kitchen losartan (COZAAR) 100 MG tablet Take 1 tablet (100 mg total) by mouth daily.    No facility-administered encounter medications on file as of 11/17/2019.     Goals Addressed            This Visit's Progress   . "I need assistance getting my lantus" (pt-stated)       CARE PLAN ENTRY (see longtitudinal plan of care for additional care plan information)  Current Barriers:  Marland Kitchen Medication procurement needs in patient with DMII, Diabetic nephropathy, and HTN at risk for worsening DM and renal disease state if unable to obtain medications and adhere to prescribed regimen  Clinical Social Work Clinical Goal(s):  Marland Kitchen Over the next 30 days, patient will work with the pharmacy team  to address needs related to medication costs/needs -completed  Interventions: . Follow up phone call to patient's daughter-[name] Yaklin to confirm referral to the [place] team for medication assistance for patient and to assess for any additional community resource needs . Confirmed that the pharmacy team has reached out to patient and his daughter to discuss medication assistance  . Explored additional community resource needs, confirmed that patient has no additional community resource needs  Patient Self Care Activities:  . Patient verbalizes understanding of plan to work  with the th pharmacy team regarding possible medication assistance . Attends all scheduled provider appointments . Has difficulty affording medications, specifically Lantus  Please see past updates related to this goal by clicking on the "Past Updates" button in the selected goal          Follow Up Plan: Client will follow up with the [place], [place] Worker  [facility] Care Management [phone]

## 2019-11-17 NOTE — Progress Notes (Signed)
This encounter was created in error - please disregard.

## 2019-11-17 NOTE — Patient Instructions (Signed)
Thank you allowing the Chronic Care Management Team to be a part of your care! It was a pleasure speaking with you today!  1. Please follow up with the [place] Team for medication assistance.   CCM (Chronic Care Management) Team   Neldon Labella RN, BSN Nurse Care Coordinator  [phone]   [place], LCSW Clinical Social Worker [phone]  Goals Addressed            This Visit's Progress   . "I need assistance getting my lantus" (pt-stated)       CARE PLAN ENTRY (see longtitudinal plan of care for additional care plan information)  Current Barriers:  Marland Kitchen Medication procurement needs in patient with DMII, Diabetic nephropathy, and HTN at risk for worsening DM and renal disease state if unable to obtain medications and adhere to prescribed regimen  Clinical Social Work Clinical Goal(s):  Marland Kitchen Over the next 30 days, patient will work with the pharmacy team  to address needs related to medication costs/needs -completed  Interventions: . Follow up phone call to patient's daughter-[name] Stamos to confirm referral to the [place] team for medication assistance for patient and to assess for any additional community resource needs . Confirmed that the pharmacy team has reached out to patient and his daughter to discuss medication assistance  . Explored additional community resource needs, confirmed that patient has no additional community resource needs  Patient Self Care Activities:  . Patient verbalizes understanding of plan to work with the th pharmacy team regarding possible medication assistance . Attends all scheduled provider appointments . Has difficulty affording medications, specifically Lantus  Please see past updates related to this goal by clicking on the "Past Updates" button in the selected goal          The patient verbalized understanding of instructions provided today and declined a print copy of patient instruction materials.    No further follow up required: patient to follow up with the [place] Team for Medication Assistance

## 2019-11-17 NOTE — Assessment & Plan Note (Signed)
I have independently reviewed his most recent CT scan with that was still almost a year ago at this point.  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  No role for intervention at that level.  Would continue surveillance studies every year or so with CT scan we will obtain a CT scan in the next few months at his convenience.  Blood pressure control is the most important factor for reducing progression of the size of the aneurysm.

## 2019-11-17 NOTE — Patient Instructions (Signed)
Thoracic Aortic Aneurysm  An aneurysm is a bulge in an artery. It happens when blood pushes up against a weakened or damaged artery wall. A thoracic aortic aneurysm is an aneurysm that occurs in the first part of the aorta, between the heart and the diaphragm. The aorta is the main artery of the body. It supplies blood from the heart to the rest of the body. Some aneurysms may not cause symptoms or problems. However, a thoracic aortic aneurysm can cause two serious problems:  It can enlarge and burst (rupture).  It can cause blood to flow between the layers of the wall of the aorta through a tear (aortic dissection). Both of these problems are medical emergencies. They can cause bleeding inside the body and can be life-threatening if they are not diagnosed and treated right away. What are the causes? The exact cause of this condition is not known. What increases the risk? The following factors may make you more likely to develop this condition:  Being 65 years of age or older.  Having a family history of aneurysms.  Using tobacco.  Having any of these conditions: ? Hardening of the arteries caused by the buildup of fat and other substances in the lining of a blood vessel (arteriosclerosis). ? Inflammation of the walls of an artery (arteritis). ? A genetic disease that weakens the body's connective tissue, such as Marfan syndrome. ? An injury or trauma to the aorta. ? High blood pressure (hypertension). ? High cholesterol. ? An infection from bacteria, such as syphilis or staphylococcus, in the wall of the aorta (infectious aortitis). What are the signs or symptoms? Symptoms of this condition vary depending on the size of the aneurysm and how fast it is growing. Most grow slowly and do not cause symptoms. When symptoms do occur, they may include:  Pain in the chest, back, sides, or abdomen. The pain may vary in intensity. Sudden, severe pain may indicate that the aneurysm has ruptured.   Hoarseness.  Cough.  Shortness of breath.  Swallowing problems.  Swelling in the face, arms, or legs.  Fever.  Unexplained weight loss. How is this diagnosed? This condition may be diagnosed with:  An ultrasound.  X-rays.  CT scan.  MRI.  A test to check the arteries for damage or blockages (angiogram). Most unruptured thoracic aortic aneurysms cause no symptoms, so they are often found during exams for other conditions. How is this treated? Treatment for this condition depends on:  The size of the aneurysm.  How fast the aneurysm is growing.  Your age.  Risk factors for rupture. Small aneurysms (2.2 inches, or 5.5 cm, or less) may be managed with:  Medicines to: ? Control blood pressure. ? Manage pain. ? Fight infection.  Regular monitoring. This may include an ultrasound or CT scan every year or every 6 months to see if the aneurysm is getting bigger. Large or fast-growing aneurysms may be treated with surgery. Follow these instructions at home: Eating and drinking   Eat a healthy diet. Your health care provider may recommend that you: ? Lower your salt (sodium) intake. In some people, too much salt can raise blood pressure and increase the risk for thoracic aortic aneurysm. ? Avoid foods that are high in saturated fat and cholesterol, such as red meat and full-fat dairy. ? Eat a diet that is low in sugar. ? Increase your fiber intake by including whole grains, vegetables, and fruits in your diet. Eating these foods may help to lower your   blood pressure.  Do not drink alcohol if your health care provider tells you not to drink.  If you drink alcohol: ? Limit how much you use to:  0-1 drink a day for women.  0-2 drinks a day for men. ? Be aware of how much alcohol is in your drink. In the [place] one drink equals one 12 oz bottle of beer (355 mL), one 5 oz glass of wine (148 mL), or one 1 oz glass of hard liquor (44 mL). Lifestyle  Do not use any  products that contain nicotine or tobacco, such as cigarettes, e-cigarettes, and chewing tobacco. If you need help quitting, ask your health care provider.  Maintain a healthy weight.  Check your blood pressure regularly. Follow your health care provider's instructions on how to keep your blood pressure within normal limits.  Have your blood sugar (glucose) level and cholesterol levels checked regularly. Follow your health care provider's instructions on how to keep levels within normal limits. Activity   Stay physically active and exercise regularly. Talk with your health care provider about how often you should exercise and ask which types of exercise are best for you.  Avoid heavy lifting and activities that take a lot of effort. Ask your health care provider what activities are safe for you. General instructions  Take over-the-counter and prescription medicines only as told by your health care provider.  Talk with your health care provider about regular screenings to see if the aneurysm is getting bigger.  Keep all follow-up visits as told by your health care provider. This is important. Contact a health care provider if you have:  Unexplained weight loss. Get help right away if you have:  Pain in your upper back, neck, or abdomen. This pain may move into your chest and arms.  Trouble swallowing.  A cough or hoarseness.  Shortness of breath. Summary  A thoracic aortic aneurysm is an aneurysm that occurs in the first part of the aorta, between the heart and the diaphragm.  As a thoracic aortic aneurysm becomes larger, it can burst (rupture), or blood can flow between the layers of the wall of the aorta through a tear (aorticdissection). These conditions can be life-threatening if they are not diagnosed and treated right away.  If you have a thoracic aortic aneurysm, its growth will be closely monitored. Surgical repair may be needed for larger or faster-growing aneurysms.  This information is not intended to replace advice given to you by your health care provider. Make sure you discuss any questions you have with your health care provider. Document Revised: 03/11/2018 Document Reviewed: 03/12/2018 Elsevier Patient Education  2020 [employer]vier Inc.  

## 2019-11-17 NOTE — Assessment & Plan Note (Signed)
blood pressure control important in reducing the progression of atherosclerotic disease and aneurysmal growth. On appropriate oral medications.  

## 2019-11-17 NOTE — Progress Notes (Signed)
Patient ID: Edgar [name], male   DOB: [birth year], 74 y.o.   MRN: [id number]  Chief Complaint  Patient presents with  . New Patient (Initial Visit)    LS 2018 thoracic aneurysm     HPI Edgar [name] is a 74 y.o. male.  I am asked to see the patient by L. Lucio Edward, PA-C for evaluation of his thoracic aortic aneurysm.  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  He had a CT scan performed last May which I have independently reviewed which showed a stable 4.3 to 4.4 cm ascending thoracic aortic aneurysm.  He was not seen at that time likely due to Covid concerns and office restrictions as well as patient's hesitancy in the early stages of the pandemic.  He has no current chest pain or signs of peripheral embolization.  He has a wife who is on dialysis and does peritoneal dialysis and he is her primary caregiver so there is a lot of stress involved.  His blood pressure is generally under fairly good control.     Past Medical History:  Diagnosis Date  . Aneurysm of aorta ([place]) 09/11/2016   Noted on CT scan Sept 2017  . 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 Coronary artery disease 10/25/2017   CT scan March 2019  . Diabetes mellitus without complication ([place])   . Essential hypertension, benign 12/23/2015  . History of colonoscopy with polypectomy 12/07/2016   Polypectomy x 12 per patient  . Hx of tobacco use, presenting hazards to health 12/23/2015   Quit prior to 1997  . Hyperlipidemia 12/23/2015  . Hypertension   . 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[place]) 05/14/2017  . Obesity 12/23/2015  . Pulmonary embolism ([place]) 05/18/2016   Hospitalization Sept 2017, following hip fracture    Past Surgical History:  Procedure Laterality Date  . COLONOSCOPY WITH PROPOFOL N/A 01/15/2017   Procedure: COLONOSCOPY WITH PROPOFOL;  Surgeon: Jonathon Bellows, MD;  Location[facility] ENDOSCOPY;  Service: Endoscopy;   Laterality: N/A;  . EYE SURGERY    . INTRAMEDULLARY (IM) NAIL INTERTROCHANTERIC Left 04/14/2016   Procedure: INTRAMEDULLARY (IM) NAIL INTERTROCHANTRIC;  Surgeon: Corky Mull, MD;  Location: [facility] ORS;  Service: Orthopedics;  Laterality: Left;  . JOINT REPLACEMENT Left 04/2016  . TOTAL HIP ARTHROPLASTY       Family History  Problem Relation Age of Onset  . Cancer Mother   . Diabetes Mother   . Heart disease Father   . Alcohol abuse Brother     Social History   Tobacco Use  . Smoking status: Former Smoker    Types: Cigarettes    Quit date: 09/01/1978    Years since quitting: 41.2  . Smokeless tobacco: Never Used  Substance Use Topics  . Alcohol use: No    Alcohol/week: 0.0 standard drinks  . Drug use: No     No Known Allergies  Current Outpatient Medications  Medication Sig Dispense Refill  . acetaminophen (TYLENOL) 500 MG tablet Take 500 mg by mouth every 6 (six) hours as needed.    Marland Kitchen amLODipine (NORVASC) 10 MG tablet Take 1 tablet (10 mg total) by mouth daily. 90 tablet 3  . atorvastatin (LIPITOR) 40 MG tablet Take 1 tablet (40 mg total) by mouth at bedtime. 90 tablet 3  . Blood Glucose Monitoring Suppl (ACCU-CHEK AVIVA PLUS) w/Device KIT 1 Device  by Does not apply route 2 (two) times daily. Dx E11.21 Check BG BID 1 kit 0  . Blood Glucose Monitoring Suppl (ONE TOUCH ULTRA 2) w/Device KIT Check fingerstick blood sugars three times a day; DX E11.65, LON 99 months 1 each 0  . cholecalciferol (VITAMIN D) 1000 units tablet Take 1,000 Units by mouth 2 (two) times daily.     . Fish Oil-Cholecalciferol (FISH OIL + D3) 1200-1000 MG-UNIT CAPS Take 1,200 mg by mouth daily. 90 capsule 3  . fluticasone (FLONASE) 50 MCG/ACT nasal spray Place 2 sprays into both nostrils daily. 16 g 6  . glucose blood (ACCU-CHEK AVIVA PLUS) test strip Use as instructed D:E11.21 Check BG bid 100 each 12  . glucose blood (ONE TOUCH ULTRA TEST) test strip USE AS INSTRUCTEDDX E11.65, LON 99 MONTHS CHECK  FINGERSTICK BLOOD SUGARS THREE TIMES A DAY 300 each 11  . HUMALOG KWIKPEN 100 UNIT/ML KwikPen INJECT 10 UNITS TOTAL INTO THE SKIN 3 (THREE) TIMES DAILY. 15 mL 6  . insulin glargine (LANTUS SOLOSTAR) 100 UNIT/ML Solostar Pen INJECT 60 UNITS INTO THE SKIN DAILY. OR AS DIRECTED BY YOUR DOCTOR 15 mL 2  . Insulin Pen Needle (B-D UF III MINI PEN NEEDLES) 31G X 5 MM MISC USE ONCE A DAY WITH LONG-ACTING INSULIN, AND UP TO 3X A DAY WITH SHORT-ACTING INSULIN 300 each 3  . JARDIANCE 25 MG TABS tablet TAKE 1 TABLET BY MOUTH DAILY. (HIGHER DOSE) 90 tablet 3  . Lancets (ACCU-CHEK MULTICLIX) lancets Use as instructed Dx Ell.21, Check BG bid 100 each 12  . Lancets (ONETOUCH ULTRASOFT) lancets Check fingerstick blood sugar three times a day; E11.65, LON 99 months 100 each 12  . levocetirizine (XYZAL) 5 MG tablet Take 1 tablet (5 mg total) by mouth every evening. 30 tablet 2  . losartan (COZAAR) 100 MG tablet Take 1 tablet (100 mg total) by mouth daily. 90 tablet 2   No current facility-administered medications for this visit.      REVIEW OF SYSTEMS (Negative unless checked)  Constitutional: [] Weight loss  [] Fever  [] Chills Cardiac: [] Chest pain   [] Chest pressure   [] Palpitations   [] Shortness of breath when laying flat   [] Shortness of breath at rest   [] Shortness of breath with exertion. Vascular:  [] Pain in legs with walking   [] Pain in legs at rest   [] Pain in legs when laying flat   [] Claudication   [] Pain in feet when walking  [] Pain in feet at rest  [] Pain in feet when laying flat   [] History of DVT   [] Phlebitis   [] Swelling in legs   [] Varicose veins   [] Non-healing ulcers Pulmonary:   [] Uses home oxygen   [] Productive cough   [] Hemoptysis   [] Wheeze  [] COPD   [] Asthma Neurologic:  [] Dizziness  [] Blackouts   [] Seizures   [] History of stroke   [] History of TIA  [] Aphasia   [] Temporary blindness   [] Dysphagia   [] Weakness or numbness in arms   [] Weakness or numbness in legs Musculoskeletal:  [x] Arthritis    [] Joint swelling   [x] Joint pain   [] Low back pain Hematologic:  [] Easy bruising  [] Easy bleeding   [] Hypercoagulable state   [] Anemic  [] Hepatitis Gastrointestinal:  [] Blood in stool   [] Vomiting blood  [x] Gastroesophageal reflux/heartburn   [] Abdominal pain Genitourinary:  [] Chronic kidney disease   [] Difficult urination  [] Frequent urination  [] Burning with urination   [] Hematuria Skin:  [] Rashes   [] Ulcers   [] Wounds Psychological:  [] History of anxiety   []   History of major depression.    Physical Exam BP 133/76   Pulse 66   Ht 6' (1.829 m)   Wt 280 lb (127 kg)   BMI 37.97 kg/m  Gen:  WD/WN, NAD Head: [place]/AT, No temporalis wasting. Prominent temp pulse not noted. Ear/Nose/Throat: Hearing grossly intact, nares w/o erythema or drainage, oropharynx w/o Erythema/Exudate Eyes: Conjunctiva clear, sclera non-icteric  Neck: trachea midline.  No JVD.  Pulmonary:  Good air movement, respirations not labored, no use of accessory muscles.  Breath sounds are equal bilaterally Cardiac: RRR, no JVD Vascular:  Vessel Right Left  Radial Palpable Palpable                                   Gastrointestinal:. No masses, surgical incisions, or scars. Musculoskeletal: M/S 5/5 throughout.  Extremities without ischemic changes.  No deformity or atrophy.  Trace lower extremity edema. Neurologic: Sensation grossly intact in extremities.  Symmetrical.  Speech is fluent. Motor exam as listed above. Psychiatric: Judgment intact, Mood & affect appropriate for pt's clinical situation. Dermatologic: No rashes or ulcers noted.  No cellulitis or open wounds.    Radiology No results found.  Labs Recent Results (from the past 2160 hour(s))  CMP w GFR     Status: Abnormal   Collection Time: 10/15/19 10:58 AM  Result Value Ref Range   Glucose, Bld 108 (H) 65 - 99 mg/dL    Comment: .            Fasting reference interval . For someone without known diabetes, a glucose value between 100 and 125  mg/dL is consistent with prediabetes and should be confirmed with a follow-up test. .    BUN 14 7 - 25 mg/dL   Creat 1.00 0.70 - 1.18 mg/dL    Comment: For patients >70 years of age, the reference limit for Creatinine is approximately 13% higher for people identified as African-American. .    GFR, Est Non African American 75 > OR = 60 mL/min/1.53m   GFR, Est African American 87 > OR = 60 mL/min/1.737m  BUN/Creatinine Ratio NOT APPLICABLE 6 - 22 (calc)   Sodium 139 135 - 146 mmol/L   Potassium 4.6 3.5 - 5.3 mmol/L   Chloride 105 98 - 110 mmol/L   CO2 28 20 - 32 mmol/L   Calcium 10.3 8.6 - 10.3 mg/dL   Total Protein 7.4 6.1 - 8.1 g/dL   Albumin 3.9 3.6 - 5.1 g/dL   Globulin 3.5 1.9 - 3.7 g/dL (calc)   AG Ratio 1.1 1.0 - 2.5 (calc)   Total Bilirubin 0.7 0.2 - 1.2 mg/dL   Alkaline phosphatase (APISO) 65 35 - 144 U/L   AST 15 10 - 35 U/L   ALT 12 9 - 46 U/L  Lipid Panel     Status: Abnormal   Collection Time: 10/15/19 10:58 AM  Result Value Ref Range   Cholesterol 119 <200 mg/dL   HDL 36 (L) > OR = 40 mg/dL   Triglycerides 62 <150 mg/dL   LDL Cholesterol (Calc) 69 mg/dL (calc)    Comment: Reference range: <100 . Desirable range <100 mg/dL for primary prevention;   <70 mg/dL for patients with CHD or diabetic patients  with > or = 2 CHD risk factors. . Marland KitchenDL-C is now calculated using the Martin-Hopkins  calculation, which is a validated novel method providing  better accuracy than the Friedewald equation in the  estimation of LDL-C.  Cresenciano Genre et al. Annamaria Helling. 7530;051(10): 2061-2068  (http://education.[employer].com/faq/FAQ164)    Total CHOL/HDL Ratio 3.3 <5.0 (calc)   Non-HDL Cholesterol (Calc) 83 <130 mg/dL (calc)    Comment: For patients with diabetes plus 1 major ASCVD risk  factor, treating to a non-HDL-C goal of <100 mg/dL  (LDL-C of <70 mg/dL) is considered a therapeutic  option.   A1C     Status: Abnormal   Collection Time: 10/15/19 10:58 AM  Result Value  Ref Range   Hgb A1c MFr Bld 7.3 (H) <5.7 % of total Hgb    Comment: For someone without known diabetes, a hemoglobin A1c value of 6.5% or greater indicates that they may have  diabetes and this should be confirmed with a follow-up  test. . For someone with known diabetes, a value <7% indicates  that their diabetes is well controlled and a value  greater than or equal to 7% indicates suboptimal  control. A1c targets should be individualized based on  duration of diabetes, age, comorbid conditions, and  other considerations. . Currently, no consensus exists regarding use of hemoglobin A1c for diagnosis of diabetes for children. .    Mean Plasma Glucose 163 (calc)   eAG (mmol/L) 9.0 (calc)  Microalbumin, urine     Status: None   Collection Time: 10/15/19 10:58 AM  Result Value Ref Range   Microalb, Ur 20.5 mg/dL    Comment: Reference Range Not established    RAM      Comment: . The ADA defines abnormalities in albumin excretion as follows: Marland Kitchen Category         Result (mcg/mg creatinine) . Normal                    <30 Microalbuminuria         30-299  Clinical albuminuria   > OR = 300 . The ADA recommends that at least two of three specimens collected within a 3-6 month period be abnormal before considering a patient to be within a diagnostic category.   HM DIABETES EYE EXAM     Status: None   Collection Time: 10/22/19 12:00 AM  Result Value Ref Range   HM Diabetic Eye Exam No Retinopathy No Retinopathy    Comment: [place] [facility]    Assessment/Plan:  Essential hypertension, benign blood pressure control important in reducing the progression of atherosclerotic disease and aneurysmal growth. On appropriate oral medications.   Hyperlipidemia lipid control important in reducing the progression of atherosclerotic disease. Continue statin therapy   Type 2 diabetes mellitus with diabetic nephropathy, 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HCC) blood glucose control  important in reducing the progression of atherosclerotic disease. Also, involved in wound healing. On appropriate medications.   Aneurysm of thoracic aorta ([place]) I have independently reviewed his most recent CT scan with that was still almost a year ago at this point.  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  No role for intervention at that level.  Would continue surveillance studies every year or so with CT scan we will obtain a CT scan in the next few months at his convenience.  Blood pressure control is the most important factor for reducing progression of the size of the aneurysm.      Leotis Pain 11/17/2019, 12:02 PM   This note was created with Dragon medical transcription system.  Any errors from dictation are unintentional.

## 2019-11-17 NOTE — Assessment & Plan Note (Signed)
lipid control important in reducing the progression of atherosclerotic disease. Continue statin therapy  

## 2019-11-18 ENCOUNTER — Ambulatory Visit (INDEPENDENT_AMBULATORY_CARE_PROVIDER_SITE_OTHER): Payer: Self-pay | Admitting: Gastroenterology

## 2019-11-18 ENCOUNTER — Ambulatory Visit: Payer: Self-pay

## 2019-11-18 ENCOUNTER — Ambulatory Visit: Payer: Self-pay | Admitting: Family Medicine

## 2019-11-18 ENCOUNTER — Other Ambulatory Visit (INDEPENDENT_AMBULATORY_CARE_PROVIDER_SITE_OTHER): Payer: Self-pay

## 2019-11-18 ENCOUNTER — Telehealth: Payer: Medicare PPO

## 2019-11-18 ENCOUNTER — Other Ambulatory Visit: Payer: Self-pay | Admitting: Pharmacy Technician

## 2019-11-18 DIAGNOSIS — Z86010 Personal history of colonic polyps: Secondary | ICD-10-CM

## 2019-11-18 DIAGNOSIS — Z1211 Encounter for screening for malignant neoplasm of colon: Secondary | ICD-10-CM

## 2019-11-18 NOTE — Progress Notes (Signed)
Patient has been scheduled for his repeat colonoscopy due to personal history of colon polyps.  This was a recall.  No Medication Changes.  Pt does not take any blood thinners.  Colonoscopy scheduled for Thursday May 13th at [facility] with Dr. Vicente Males.  Patient advised of COVID test on Tuesday May 11th, 2021.  Instructions will be mailed.  Thanks,  [place], [place]

## 2019-11-18 NOTE — Chronic Care Management (AMB) (Signed)
  Chronic Care Management   Outreach Note  11/18/2019 Name: ARKIE HAGGE MRN: [id number] DOB: [birth year]  Primary Care Provider: Delsa Grana, PA-C Reason for referral : Chronic Care Management   An unsuccessful telephone outreach was attempted today. Mr. Marana was referred to the case management team for assistance with care management and care coordination. Per chart review, the CCM LCSW was able to successfully reach Mr. Trainer. Notes also indicate that he requested assistance with obtaining Lantus.   Follow Up Plan: -Will attempt to reach Mr. Andrzejewski again within the next two weeks.   [facility] Care Management [phone]

## 2019-11-18 NOTE — Patient Outreach (Signed)
[facility]) Care Management  11/18/2019  Edgar [name] May 28, 1946 [id number]                                       Medication Assistance Referral  Referral From: [place]   Medication/Company: Humalog and Bas[employer] / Community education officer Patient application portion:  Education officer, museum portion: Faxed  to L. Lucio Edward, [place] Provider address/fax verified via: Office website  Medication/Company: Vania Rea / [employer] Patient application portion:  Mailed Provider application portion: Faxed  to L. Lucio Edward, [place] Provider address/fax verified via: Office website   Follow up:  Will follow up with patient in 10-14 business days to confirm application(s) have been received.  Maud Deed Chana Bode, [place] Certified Pharmacy Technician [employer]

## 2019-11-19 ENCOUNTER — Ambulatory Visit (INDEPENDENT_AMBULATORY_CARE_PROVIDER_SITE_OTHER): Payer: Medicare PPO

## 2019-11-19 ENCOUNTER — Other Ambulatory Visit: Payer: Self-pay

## 2019-11-19 ENCOUNTER — Encounter: Payer: Self-pay | Admitting: Podiatry

## 2019-11-19 ENCOUNTER — Ambulatory Visit: Payer: Medicare PPO | Admitting: Podiatry

## 2019-11-19 VITALS — Temp 97.3°F

## 2019-11-19 DIAGNOSIS — B351 Tinea unguium: Secondary | ICD-10-CM

## 2019-11-19 DIAGNOSIS — Z598 Other problems related to housing and economic circumstances: Secondary | ICD-10-CM | POA: Diagnosis not present

## 2019-11-19 DIAGNOSIS — E0843 Diabetes mellitus due to underlying condition with diabetic autonomic (poly)neuropathy: Secondary | ICD-10-CM | POA: Diagnosis not present

## 2019-11-19 DIAGNOSIS — M79676 Pain in unspecified toe(s): Secondary | ICD-10-CM | POA: Diagnosis not present

## 2019-11-19 DIAGNOSIS — Z0000 Encounter for general adult medical examination without abnormal findings: Secondary | ICD-10-CM

## 2019-11-19 DIAGNOSIS — Z599 Problem related to housing and economic circumstances, unspecified: Secondary | ICD-10-CM

## 2019-11-19 NOTE — Patient Instructions (Signed)
Edgar [name] , Thank you for taking time to come for your Medicare Wellness Visit. I appreciate your ongoing commitment to your health goals. Please review the following plan we discussed and let me know if I can assist you in the future.   Screening recommendations/referrals: Colonoscopy: done 01/15/17 Recommended yearly ophthalmology/optometry visit for glaucoma screening and checkup Recommended yearly dental visit for hygiene and checkup  Vaccinations: Influenza vaccine: done 04/14/19 Pneumococcal vaccine: done 08/26/17 Tdap vaccine: done 05/13/09 Shingles vaccine: Shingrix discussed. Please contact your pharmacy for coverage information.    Covid-19: 09/18/19 7 10/16/19  Advanced directives: Advance directive discussed with you today. Even though you declined this today please call our office should you change your mind and we can give you the proper paperwork for you to fill out.  Conditions/risks identified: Recommend increasing physical activity.   Next appointment: Please follow up in one year for your Medicare Annual Wellness visit.   Preventive Care 73 Years and Older, Male Preventive care refers to lifestyle choices and visits with your health care provider that can promote health and wellness. What does preventive care include?  A yearly physical exam. This is also called an annual well check.  Dental exams once or twice a year.  Routine eye exams. Ask your health care provider how often you should have your eyes checked.  Personal lifestyle choices, including:  Daily care of your teeth and gums.  Regular physical activity.  Eating a healthy diet.  Avoiding tobacco and drug use.  Limiting alcohol use.  Practicing safe sex.  Taking low doses of aspirin every day.  Taking vitamin and mineral supplements as recommended by your health care provider. What happens during an annual well check? The services and screenings done by your health care provider during your annual  well check will depend on your age, overall health, lifestyle risk factors, and family history of disease. Counseling  Your health care provider may ask you questions about your:  Alcohol use.  Tobacco use.  Drug use.  Emotional well-being.  Home and relationship well-being.  Sexual activity.  Eating habits.  History of falls.  Memory and ability to understand (cognition).  Work and work Statistician. Screening  You may have the following tests or measurements:  Height, weight, and BMI.  Blood pressure.  Lipid and cholesterol levels. These may be checked every 5 years, or more frequently if you are over 16 years old.  Skin check.  Lung cancer screening. You may have this screening every year starting at age 77 if you have a 30-pack-year history of smoking and currently smoke or have quit within the past 15 years.  Fecal occult blood test (FOBT) of the stool. You may have this test every year starting at age 87.  Flexible sigmoidoscopy or colonoscopy. You may have a sigmoidoscopy every 5 years or a colonoscopy every 10 years starting at age 41.  Prostate cancer screening. Recommendations will vary depending on your family history and other risks.  Hepatitis C blood test.  Hepatitis B blood test.  Sexually transmitted disease (STD) testing.  Diabetes screening. This is done by checking your blood sugar (glucose) after you have not eaten for a while (fasting). You may have this done every 1-3 years.  Abdominal aortic aneurysm (AAA) screening. You may need this if you are a current or former smoker.  Osteoporosis. You may be screened starting at age 64 if you are at high risk. Talk with your health care provider about your test results,  treatment options, and if necessary, the need for more tests. Vaccines  Your health care provider may recommend certain vaccines, such as:  Influenza vaccine. This is recommended every year.  Tetanus, diphtheria, and acellular  pertussis (Tdap, Td) vaccine. You may need a Td booster every 10 years.  Zoster vaccine. You may need this after age 46.  Pneumococcal 13-valent conjugate (PCV13) vaccine. One dose is recommended after age 85.  Pneumococcal polysaccharide (PPSV23) vaccine. One dose is recommended after age 66. Talk to your health care provider about which screenings and vaccines you need and how often you need them. This information is not intended to replace advice given to you by your health care provider. Make sure you discuss any questions you have with your health care provider. Document Released: 08/19/2015 Document Revised: 04/11/2016 Document Reviewed: 05/24/2015 Elsevier Interactive Patient Education  2017 [place] Prevention in the Home Falls can cause injuries. They can happen to people of all ages. There are many things you can do to make your home safe and to help prevent falls. What can I do on the outside of my home?  Regularly fix the edges of walkways and driveways and fix any cracks.  Remove anything that might make you trip as you walk through a door, such as a raised step or threshold.  Trim any bushes or trees on the path to your home.  Use bright outdoor lighting.  Clear any walking paths of anything that might make someone trip, such as rocks or tools.  Regularly check to see if handrails are loose or broken. Make sure that both sides of any steps have handrails.  Any raised decks and porches should have guardrails on the edges.  Have any leaves, snow, or ice cleared regularly.  Use sand or salt on walking paths during winter.  Clean up any spills in your garage right away. This includes oil or grease spills. What can I do in the bathroom?  Use night lights.  Install grab bars by the toilet and in the tub and shower. Do not use towel bars as grab bars.  Use non-skid mats or decals in the tub or shower.  If you need to sit down in the shower, use a plastic,  non-slip stool.  Keep the floor dry. Clean up any water that spills on the floor as soon as it happens.  Remove soap buildup in the tub or shower regularly.  Attach bath mats securely with double-sided non-slip rug tape.  Do not have throw rugs and other things on the floor that can make you trip. What can I do in the bedroom?  Use night lights.  Make sure that you have a light by your bed that is easy to reach.  Do not use any sheets or blankets that are too big for your bed. They should not hang down onto the floor.  Have a firm chair that has side arms. You can use this for support while you get dressed.  Do not have throw rugs and other things on the floor that can make you trip. What can I do in the kitchen?  Clean up any spills right away.  Avoid walking on wet floors.  Keep items that you use a lot in easy-to-reach places.  If you need to reach something above you, use a strong step stool that has a grab bar.  Keep electrical cords out of the way.  Do not use floor polish or wax that makes floors  slippery. If you must use wax, use non-skid floor wax.  Do not have throw rugs and other things on the floor that can make you trip. What can I do with my stairs?  Do not leave any items on the stairs.  Make sure that there are handrails on both sides of the stairs and use them. Fix handrails that are broken or loose. Make sure that handrails are as long as the stairways.  Check any carpeting to make sure that it is firmly attached to the stairs. Fix any carpet that is loose or worn.  Avoid having throw rugs at the top or bottom of the stairs. If you do have throw rugs, attach them to the floor with carpet tape.  Make sure that you have a light switch at the top of the stairs and the bottom of the stairs. If you do not have them, ask someone to add them for you. What else can I do to help prevent falls?  Wear shoes that:  Do not have high heels.  Have rubber  bottoms.  Are comfortable and fit you well.  Are closed at the toe. Do not wear sandals.  If you use a stepladder:  Make sure that it is fully opened. Do not climb a closed stepladder.  Make sure that both sides of the stepladder are locked into place.  Ask someone to hold it for you, if possible.  Clearly mark and make sure that you can see:  Any grab bars or handrails.  First and last steps.  Where the edge of each step is.  Use tools that help you move around (mobility aids) if they are needed. These include:  Canes.  Walkers.  Scooters.  Crutches.  Turn on the lights when you go into a dark area. Replace any light bulbs as soon as they burn out.  Set up your furniture so you have a clear path. Avoid moving your furniture around.  If any of your floors are uneven, fix them.  If there are any pets around you, be aware of where they are.  Review your medicines with your doctor. Some medicines can make you feel dizzy. This can increase your chance of falling. Ask your doctor what other things that you can do to help prevent falls. This information is not intended to replace advice given to you by your health care provider. Make sure you discuss any questions you have with your health care provider. Document Released: 05/19/2009 Document Revised: 12/29/2015 Document Reviewed: 08/27/2014 Elsevier Interactive Patient Education  2017 [employer].

## 2019-11-19 NOTE — Progress Notes (Signed)
This patient returns to my office for at risk foot care.  This patient requires this care by a professional since this patient will be at risk due to having  Diabetes with diabetic nephropathy.  This patient is unable to cut nails himself since the patient cannot reach his nails.These nails are painful walking and wearing shoes.  This patient presents for at risk foot care today.  General Appearance  Alert, conversant and in no acute stress.  Vascular  Dorsalis pedis and posterior tibial  pulses are palpable  bilaterally.  Capillary return is within normal limits  bilaterally. Temperature is within normal limits  bilaterally.  Neurologic  Senn-Weinstein monofilament wire test within normal limits  bilaterally. Muscle power within normal limits bilaterally.  Nails Thick disfigured discolored nails with subungual debris  from hallux to fifth toes bilaterally. No evidence of bacterial infection or drainage bilaterally.  Orthopedic  No limitations of motion  feet .  No crepitus or effusions noted.  No bony pathology or digital deformities noted.  Skin  normotropic skin with no porokeratosis noted bilaterally.  No signs of infections or ulcers noted.     Onychomycosis  Pain in right toes  Pain in left toes  Consent was obtained for treatment procedures.   Mechanical debridement of nails 1-5  bilaterally performed with a nail nipper.  Filed with dremel without incident.    Return office visit   3 months                   Told patient to return for periodic foot care and evaluation due to potential at risk complications.   Priscella Donna DPM  

## 2019-11-19 NOTE — Progress Notes (Signed)
Subjective:   Edgar [name] is a 74 y.o. male who presents for Medicare Annual/Subsequent preventive examination.  Virtual Visit via Telephone Note  I connected with [name] on 11/19/19 at 10:00 AM EDT by telephone and verified that I am speaking with the correct person using two identifiers.  Medicare Annual Wellness visit completed telephonically due to Covid-19 pandemic.   Location: Patient: home Provider: office   I discussed the limitations, risks, security and privacy concerns of performing an evaluation and management service by telephone and the availability of in person appointments. The patient expressed understanding and agreed to proceed.  Some vital signs may be absent or patient reported.   Clemetine Marker, LPN    Review of Systems:   Cardiac Risk Factors include: advanced age (>16mn, >>84women);diabetes mellitus;dyslipidemia;male gender;hypertension     Objective:    Vitals: There were no vitals taken for this visit.  There is no height or weight on file to calculate BMI.  Advanced Directives 11/19/2019 10/17/2018 05/30/2017 05/14/2017 04/30/2017 01/15/2017 12/07/2016  Does Patient Have a Medical Advance Directive? _0  No Yes  Type of Advance Directive - - - - - - Living will  Would patient like information on creating a medical advance directive? No - Patient declined No - Patient declined - - - - -    Tobacco Social History   Tobacco Use  Smoking Status Former Smoker  . Types: Cigarettes  . Quit date: 09/01/1978  . Years since quitting: 41.2  Smokeless Tobacco Never Used     Counseling given: Not Answered   Clinical Intake:  Pre-visit preparation completed: Yes  Pain : No/denies pain     Nutritional Risks: None Diabetes: Yes CBG done?: No Did pt. bring in CBG monitor from home?: No   Nutrition Risk Assessment:  Has the patient had any N/V/D within the last 2 months?  No  Does the patient have any non-healing wounds?  No  Has  the patient had any unintentional weight loss or weight gain?  No   Diabetes:  Is the patient diabetic?  Yes  If diabetic, was a CBG obtained today?  No  Did the patient bring in their glucometer from home?  No  How often do you monitor your CBG's? Several times per week per patient.   Financial Strains and Diabetes Management:  Are you having any financial strains with the device, your supplies or your medication? Yes .  Does the patient want to be seen by Chronic Care Management for management of their diabetes?  Yes  - already enrolled Would the patient like to be referred to a Nutritionist or for Diabetic Management?  No   Diabetic Exams:  Diabetic Eye Exam: Completed 10/22/19 negative retinopathy.   Diabetic Foot Exam: Completed 10/15/19.   How often do you need to have someone help you when you read instructions, pamphlets, or other written materials from your doctor or pharmacy?: 1 - Never  Interpreter Needed?: No  Information entered by :: KClemetine MarkerLPN  Past Medical History:  Diagnosis Date  . [place]   Noted on CT scan Sept 2017  . [place] 09/24[place]2017  . Coronary artery disease 10/25/2017   CT scan March 2019  . Diabetes mellitus without complication ([place]   . Essential hypertension, benign 12/23/2015  . History of colonoscopy with polypectomy 12/07/2016   Polypectomy x 12 per patient  . Hx of tobacco use, presenting  hazards to health 12/23/2015   Quit prior to 1997  . Hyperlipidemia 12/23/2015  . Hypertension   . 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[place]) 05/14/2017  . Obesity 12/23/2015  . Pulmonary embolism ([place]) 05/18/2016   Hospitalization Sept 2017, following hip fracture   Past Surgical History:  Procedure Laterality Date  . COLONOSCOPY WITH PROPOFOL N/A 01/15/2017   Procedure: COLONOSCOPY WITH PROPOFOL;  Surgeon: Jonathon Bellows, MD;  [facility] END[facility]COPY;  Service: Endoscopy;  Laterality: N/A;  .  EYE SURGERY    . INTRAMEDULLARY (IM) NAIL INTERTROCHANTERIC Left 04/14/2016   Procedure: INTRAMEDULLARY (IM) NAIL INTERTROCHANTRIC;  Surgeon: Corky Mull, MD;  Location: [facility] ORS;  Service: Orthopedics;  Laterality: Left;  . JOINT REPLACEMENT Left 04/2016  . TOTAL HIP ARTHROPLASTY     Family History  Problem Relation Age of Onset  . Cancer Mother   . Diabetes Mother   . Heart disease Father   . Alcohol abuse Brother    Social History   Socioeconomic History  . Marital status: Married    Spouse name: Not on file  . Number of children: Not on file  . Years of education: Not on file  . Highest education level: Not on file  Occupational History  . Not on file  Tobacco Use  . Smoking status: Former Smoker    Types: Cigarettes    Quit date: 09/01/1978    Years since quitting: 41.2  . Smokeless tobacco: Never Used  Substance and Sexual Activity  . Alcohol use: No    Alcohol/week: 0.0 standard drinks  . Drug use: No  . Sexual activity: Not Currently  Other Topics Concern  . Not on file  Social History Narrative  . Not on file   Social Determinants of Health   Financial Resource Strain: High Risk  . Difficulty of Paying Living Expenses: Hard  Food Insecurity: No Food Insecurity  . Worried About Charity fundraiser in the Last Year: Never true  . Ran Out of Food in the Last Year: Never true  Transportation Needs: No Transportation Needs  . Lack of Transportation (Medical): No  . Lack of Transportation (Non-Medical): No  Physical Activity: Insufficiently Active  . Days of Exercise per Week: 5 days  . Minutes of Exercise per Session: 20 min  Stress: No Stress Concern Present  . Feeling of Stress : Not at all  Social Connections: Slightly Isolated  . Frequency of Communication with Friends and Family: More than three times a week  . Frequency of Social Gatherings with Friends and Family: Three times a week  . Attends Religious Services: 1 to 4 times per year  . Active  Member of Clubs or Organizations: No  . Attends Archivist Meetings: Never  . Marital Status: Married    Outpatient Encounter Medications as of 11/19/2019  Medication Sig  . acetaminophen (TYLENOL) 500 MG tablet Take 500 mg by mouth every 6 (six) hours as needed.  Marland Kitchen amLODipine (NORVASC) 10 MG tablet Take 1 tablet (10 mg total) by mouth daily.  Marland Kitchen atorvastatin (LIPITOR) 40 MG tablet Take 1 tablet (40 mg total) by mouth at bedtime.  . Blood Glucose Monitoring Suppl (ACCU-CHEK AVIVA PLUS) w/Device KIT 1 Device by Does not apply route 2 (two) times daily. Dx E11.21 Check BG BID  . Blood Glucose Monitoring Suppl (ONE TOUCH ULTRA 2) w/Device KIT Check fingerstick blood sugars three times a day; DX E11.65, LON 99 months  . cholecalciferol (VITAMIN D) 1000 units  tablet Take 1,000 Units by mouth 2 (two) times daily.   . Fish Oil-Cholecalciferol (FISH OIL + D3) 1200-1000 MG-UNIT CAPS Take 1,200 mg by mouth daily.  . fluticasone (FLONASE) 50 MCG/ACT nasal spray Place 2 sprays into both nostrils daily.  Marland Kitchen glucose blood (ACCU-CHEK AVIVA PLUS) test strip Use as instructed D:E11.21 Check BG bid  . glucose blood (ONE TOUCH ULTRA TEST) test strip USE AS INSTRUCTEDDX E11.65, LON 99 MONTHS CHECK FINGERSTICK BLOOD SUGARS THREE TIMES A DAY  . HUMALOG KWIKPEN 100 UNIT/ML KwikPen INJECT 10 UNITS TOTAL INTO THE SKIN 3 (THREE) TIMES DAILY.  Marland Kitchen insulin glargine (LANTUS SOLOSTAR) 100 UNIT/ML Solostar Pen INJECT 60 UNITS INTO THE SKIN DAILY. OR AS DIRECTED BY YOUR DOCTOR  . Insulin Pen Needle (B-D UF III MINI PEN NEEDLES) 31G X 5 MM MISC USE ONCE A DAY WITH LONG-ACTING INSULIN, AND UP TO 3X A DAY WITH SHORT-ACTING INSULIN  . JARDIANCE 25 MG TABS tablet TAKE 1 TABLET BY MOUTH DAILY. (HIGHER DOSE)  . Lancets (ACCU-CHEK MULTICLIX) lancets Use as instructed Dx Ell.21, Check BG bid  . Lancets (ONETOUCH ULTRASOFT) lancets Check fingerstick blood sugar three times a day; E11.65, LON 99 months  . levocetirizine (XYZAL) 5  MG tablet Take 1 tablet (5 mg total) by mouth every evening.  Marland Kitchen losartan (COZAAR) 100 MG tablet Take 1 tablet (100 mg total) by mouth daily.   No facility-administered encounter medications on file as of 11/19/2019.    Activities of Daily Living In your present state of health, do you have any difficulty performing the following activities: 11/19/2019 11/11/2019  Hearing? Tempie Donning  Comment has hearing aids -  Vision? N N  Difficulty concentrating or making decisions? N N  Walking or climbing stairs? N N  Dressing or bathing? N N  Doing errands, shopping? N N  Preparing Food and eating ? N -  Using the Toilet? N -  In the past six months, have you accidently leaked urine? N -  Do you have problems with loss of bowel control? N -  Managing your Medications? N -  Managing your Finances? N -  Housekeeping or managing your Housekeeping? N -  Some recent data might be hidden    Patient Care Team: Delsa Grana, PA-C as PCP - General (Family Medicine) Poggi, Marshall Cork, MD as Consulting Physician (Surgery) Cathi Roan, [facility] as Pharmacist (Pharmacist) Neldon Labella, RN as Case Manager [place], Darla Lesches, LCSW as Social Worker Adaline Sill, CPhT as Pharmacy Technician (Pharmacy Technician)   Assessment:   This is a routine wellness examination for Ramond.  Exercise Activities and Dietary recommendations Current Exercise Habits: Home exercise routine, Type of exercise: walking, Time (Minutes): 20, Frequency (Times/Week): 5, Weekly Exercise (Minutes/Week): 100, Intensity: Intense, Exercise limited by: None identified  Goals    . "I need assistance getting my lantus" (pt-stated)     CARE PLAN ENTRY (see longtitudinal plan of care for additional care plan information)  Current Barriers:  Marland Kitchen Medication procurement needs in patient with DMII, Diabetic nephropathy, and HTN at risk for worsening DM and renal disease state if unable to obtain medications and adhere to prescribed regimen   Clinical Social Work Clinical Goal(s):  Marland Kitchen Over the next 30 days, patient will work with the pharmacy team  to address needs related to medication costs/needs -completed  Interventions: . Follow up phone call to patient's daughter-[name] Spradlin to confirm referral to the [place] team for medication assistance for patient and to  assess for any additional community resource needs . Confirmed that the pharmacy team has reached out to patient and his daughter to discuss medication assistance  . Explored additional community resource needs, confirmed that patient has no additional community resource needs  Patient Self Care Activities:  . Patient verbalizes understanding of plan to work with the th pharmacy team regarding possible medication assistance . Attends all scheduled provider appointments . Has difficulty affording medications, specifically Lantus  Please see past updates related to this goal by clicking on the "Past Updates" button in the selected goal      . Weight (lb) < 250 lb (113.4 kg)     Pt would like to lose weight over the next year with healthy eating and physical activity       Fall Risk Fall Risk  11/19/2019 11/11/2019 10/15/2019 08/14/2019 04/14/2019  Falls in the past year? 0 0 0 0 0  Number falls in past yr: 0 0 0 0 0  Injury with Fall? 0 0 0 0 0  Risk for fall due to : No Fall Risks - - - -  Follow up Falls prevention discussed - - - -   FALL RISK PREVENTION PERTAINING TO THE HOME:  Any stairs in or around the home? No  If so, do they handrails? No   Home free of loose throw rugs in walkways, pet beds, electrical cords, etc? Yes  Adequate lighting in your home to reduce risk of falls? Yes   ASSISTIVE DEVICES UTILIZED TO PREVENT FALLS:  Life alert? No Use of a cane, walker or w/c? No  Grab bars in the bathroom? No  Shower chair or bench in shower? No  Elevated toilet seat or a handicapped toilet? Yes   DME ORDERS:  DME order needed?  No    TIMED UP AND GO:  Was the test performed? No . Telephonic visit.   Education: Fall risk prevention has been discussed.  Intervention(s) required? No   Depression Screen PHQ 2/9 Scores 11/19/2019 11/11/2019 11/10/2019 10/15/2019  PHQ - 2 Score 0 0 0 0  PHQ- 9 Score - 0 - 0    Cognitive Function     6CIT Screen 10/17/2018  What Year? 0 points  What month? 0 points  What time? 0 points  Count back from 20 0 points  Months in reverse 0 points  Repeat phrase 2 points  Total Score 2    Immunization History  Administered Date(s) Administered  . Fluad Quad(high Dose 65+) 04/14/2019  . Influenza, High Dose Seasonal PF 05/30/2015, 04/30/2017, 05/29/2018  . Influenza-Unspecified 04/25/2016  . Moderna SARS-COVID-2 Vaccination 09/18/2019, 10/16/2019  . Pneumococcal Conjugate-13 10/08/2013  . Pneumococcal Polysaccharide-23 08/26/2017  . Pneumococcal-Unspecified 04/17/2010  . Td 05/13/2009  . Zoster 10/11/2013    Qualifies for Shingles Vaccine? Yes  Zostavax completed 2015. Due for Shingrix. Education has been provided regarding the importance of this vaccine. Pt has been advised to call insurance company to determine out of pocket expense. Advised may also receive vaccine at local pharmacy or Health Dept. Verbalized acceptance and understanding.  Tdap: Although this vaccine is not a covered service during a Wellness Exam, does the patient still wish to receive this vaccine today?  No .  Education has been provided regarding the importance of this vaccine. Advised may receive this vaccine at local pharmacy or Health Dept. Aware to provide a copy of the vaccination record if obtained from local pharmacy or Health Dept. Verbalized acceptance and understanding.  Flu Vaccine:  Up to date  Pneumococcal Vaccine: Up to date   Screening Tests Health Maintenance  Topic Date Due  . TETANUS/TDAP  08/13/2020 (Originally 05/14/2019)  . COLONOSCOPY  01/16/2020  . INFLUENZA VACCINE  03/06/2020  .  HEMOGLOBIN A1C  04/16/2020  . FOOT EXAM  10/14/2020  . OPHTHALMOLOGY EXAM  10/21/2020  . Hepatitis C Screening  Completed  . PNA vac Low Risk Adult  Completed   Cancer Screenings:  Colorectal Screening: Completed 01/15/17. Repeat every 3 years; Pt scheduled for May 2021.  Lung Cancer Screening: (Low Dose CT Chest recommended if Age 60-80 years, 30 pack-year currently smoking OR have quit w/in 15years.) does not qualify.   Additional Screening:  Hepatitis C Screening: does qualify; Completed 12/07/16  Vision Screening: Recommended annual ophthalmology exams for early detection of glaucoma and other disorders of the eye. Is the patient up to date with their annual eye exam?  Yes  Who is the provider or what is the name of the office in which the pt attends annual eye exams? [place] Screening: Recommended annual dental exams for proper oral hygiene  Community Resource Referral:  CRR required this visit?  Yes      Plan:    I have personally reviewed and addressed the Medicare Annual Wellness questionnaire and have noted the following in the patient's chart:  A. Medical and social history B. Use of alcohol, tobacco or illicit drugs  C. Current medications and supplements D. Functional ability and status E.  Nutritional status F.  Physical activity G. Advance directives H. List of other physicians I.  Hospitalizations, surgeries, and ER visits in previous 12 months J.  [place] such as hearing and vision if needed, cognitive and depression L. Referrals and appointments   In addition, I have reviewed and discussed with patient certain preventive protocols, quality metrics, and best practice recommendations. A written personalized care plan for preventive services as well as general preventive health recommendations were provided to patient.   Signed,  Clemetine Marker, LPN Nurse Health Advisor   Nurse Notes: referral sent to C3 team for utility  assistance and notified patient application for medication assistance mailed to him by [facility] pharmacy team. Advised patient to contact us if he does not receive it or needs help filling it out.

## 2019-11-23 ENCOUNTER — Telehealth: Payer: Self-pay

## 2019-11-23 NOTE — Telephone Encounter (Signed)
Copied from [place] [phone]. Topic: Referral - Status >> Nov 23, 2019  [id number] PM Simone Curia D wrote: [phone] Spoke with patient he requested that I speak to his daughter regarding completing LIEAP application since she pays his bills for him.  I gave him my number to have his daughter call me back.  Ambrose Mantle [phone]

## 2019-11-23 NOTE — Telephone Encounter (Signed)
Copied from [place] [phone]. Topic: Referral - Status >> Nov 23, 2019  [id number] PM Simone Curia D wrote: [phone] Spoke with patient's daughter [name] assisted with  LIEAP application. Education administrator to [employer] to mail to patient.  Will follow-up with patient's daughter on 4/26 to confirm she received application. Ambrose Mantle [phone]

## 2019-11-30 ENCOUNTER — Ambulatory Visit: Payer: Self-pay

## 2019-11-30 ENCOUNTER — Telehealth: Payer: Medicare PPO

## 2019-11-30 NOTE — Chronic Care Management (AMB) (Signed)
  Chronic Care Management   Note  11/30/2019 Name: Edgar [name] MRN: [id number] DOB: Sep 22, 1945   An unsuccessful outreach was attempted today. [name] is currently engaged with the chronic care management team.      Follow up plan: The care management team will reach out to [name] again this week.   [place]/[facility] Care Management [phone]

## 2019-12-02 ENCOUNTER — Telehealth: Payer: Self-pay

## 2019-12-02 ENCOUNTER — Ambulatory Visit: Payer: Medicare PPO

## 2019-12-02 ENCOUNTER — Ambulatory Visit
Admission: RE | Admit: 2019-12-02 | Discharge: 2019-12-02 | Disposition: A | Discharge status: Home / Self Care | Payer: Medicare PPO | Source: Ambulatory Visit | Attending: Vascular Surgery | Admitting: Vascular Surgery

## 2019-12-02 ENCOUNTER — Other Ambulatory Visit: Payer: Self-pay

## 2019-12-02 DIAGNOSIS — I712 Thoracic aortic aneurysm, without rupture, unspecified: Secondary | ICD-10-CM

## 2019-12-02 NOTE — Telephone Encounter (Signed)
Copied from [place] [id number]. Topic: Referral - Status >> Dec 02, 2019 123456 PM Simone Curia D wrote: [id number] Spoke with patient's daughter Hilding Mallary he has received the LIEAP application and she will review it, have him sign and place in the mail by Friday 12/04/19.  No other resources are needed for this patient. Closing referral. Ambrose Mantle [phone]

## 2019-12-02 NOTE — Telephone Encounter (Signed)
Entered in error Edgar [name] 

## 2019-12-07 ENCOUNTER — Ambulatory Visit (INDEPENDENT_AMBULATORY_CARE_PROVIDER_SITE_OTHER): Payer: Medicare PPO

## 2019-12-07 DIAGNOSIS — E1121 Type 2 diabetes mellitus with diabetic nephropathy: Secondary | ICD-10-CM | POA: Diagnosis not present

## 2019-12-07 DIAGNOSIS — Z794 Long term (current) use of insulin: Secondary | ICD-10-CM

## 2019-12-07 DIAGNOSIS — I10 Essential (primary) hypertension: Secondary | ICD-10-CM | POA: Diagnosis not present

## 2019-12-07 NOTE — Chronic Care Management (AMB) (Signed)
  Chronic Care Management   Note   Name: Edgar [name] MRN: [id number] DOB: October 10, 1945    Brief outreach with [name]. Reports doing well but busy at the time of the call. Agreeable to outreach within the next week.    Follow up plan: The care management team will follow-up as requested next week.    [place]/[facility] Care Management [phone]

## 2019-12-07 NOTE — Patient Instructions (Addendum)
Thank you for allowing the Chronic Care Management team to participate in your care.   Goals Addressed            This Visit's Progress   . Chronic Care Management       CARE PLAN ENTRY (see longitudinal plan of care for additional care plan information)  Current Barriers:  . Chronic Disease Management support and education needs related Hypertension, Coronary Artery Disease, Hyperlipidemia and Diabetes.  Case Manager Clinical Goal(s):  Marland Kitchen Over the next 90 days, patient will take all medications as prescribed. . Over the next 90 days, patient will attend all medical appointments as scheduled. . Over the next 90 days, patient will monitor his blood pressure and record readings. . Over the next 90 days, patient will monitor his blood glucose daily and maintain a log. . Over the next 90 days, patient will work with the care management team to address concerns regarding financial restraints and need for additional services in the home.  Interventions:  . Inter-disciplinary care team collaboration (see longitudinal plan of care) . Reviewed medications. Encouraged to take medications as prescribed and notify care management team with concerns regarding medication management. Advised to keep team informed of concerns regarding prescription costs. Pharmacy team provided assistance with obtaining insulin. Reports self administering medications without difficulties. . Provided education regarding established blood pressure parameters. Encouraged to monitor blood pressure and record readings. Encouraged to notify provider with readings outside of established parameters.  . Provided education regarding s/sx of hypoglycemia and hyperglycemia along with recommended interventions. Encouraged to monitor blood glucose at least twice a day (fasting and prior to evening insulin dose) and maintain a log. Reports fasting range of 121- 156 mg/dl over the past two weeks. Reading today was 129  mg/dl. . Discussed nutritional intake and cardiac prudent/diabetic diet options. Encouraged to continue attempted compliance with diet recommendations. Encourage to notify team with concerns regarding nutrition or need for assistance. Reports good nutritional intake. Also report receiving delivery via [employer] Whe[employer]. . Reviewed pending appointments. Encouraged to attend appointments as scheduled to prevent delays in care. Encouraged to contact team with concerns regarding transportation. Denies current need for transportation assistance. Pending colonoscopy on 12/17/19. Marland Kitchen Discussed plan for ongoing care management. Confirmed receipt of information from [place] for utility assistance. Encouraged to keep care team informed of changes with care management needs.   Patient Self Care Activities:  . Self administers medications  . Attends scheduled provider appointments . Performs ADL's independently . Performs IADL's independently   Initial goal documentation         Edgar [name] was given information about Chronic Care Management services  including:  1. CCM service includes personalized support from designated clinical staff supervised by his physician, including individualized plan of care and coordination with other care providers 2. 24/7 contact phone numbers for assistance for urgent and routine care needs. 3. Service will only be billed when office clinical staff spend 20 minutes or more in a month to coordinate care. 4. Only one practitioner may furnish and bill the service in a calendar month. 5. The patient may stop CCM services at any time (effective at the end of the month) by phone call to the office staff. 6. The patient will be responsible for cost sharing (co-pay) of up to 20% of the service fee (after annual deductible is met).  Patient agreed to services and verbal consent obtained.    Edgar [name] verbalized understanding of the instructions provided during the  telephonic  outreach today. Declined need for a mailed/printed copy of the instructions.   The care management team will follow-up with Edgar [name] later this month.   [place]/[facility] Care Management [phone]

## 2019-12-07 NOTE — Chronic Care Management (AMB) (Signed)
Chronic Care Management   Initial Visit Note  12/07/2019 Name: Edgar [name] MRN: [id number] DOB: 09-26-1945  Primary Care Provider: Delsa Grana, PA-C Reason for referral : Chronic Care Management   Edgar [name] is a 74 y.o. year old male who is a primary care patient of Delsa Grana, [place]. The CCM Nurse Case Manager was consulted for assistance with chronic disease management and care coordination. The initial telephonic assessment was conducted today.  Review of [name] status, including review of consultants reports, relevant labs and test results was conducted today. Collaboration with appropriate care team members was performed as part of the comprehensive evaluation and provision of chronic care management services.    SDOH (Social Determinants of Health) assessments performed: Yes No additional interventions needed at this time.       Medications: Outpatient Encounter Medications as of 12/07/2019  Medication Sig Note  . acetaminophen (TYLENOL) 500 MG tablet Take 500 mg by mouth every 6 (six) hours as needed. 12/07/2016: PRN  . amLODipine (NORVASC) 10 MG tablet Take 1 tablet (10 mg total) by mouth daily.   Marland Kitchen atorvastatin (LIPITOR) 40 MG tablet Take 1 tablet (40 mg total) by mouth at bedtime.   . Blood Glucose Monitoring Suppl (ACCU-CHEK AVIVA PLUS) w/Device KIT 1 Device by Does not apply route 2 (two) times daily. Dx E11.21 Check BG BID   . Blood Glucose Monitoring Suppl (ONE TOUCH ULTRA 2) w/Device KIT Check fingerstick blood sugars three times a day; DX E11.65, LON 99 months   . cholecalciferol (VITAMIN D) 1000 units tablet Take 1,000 Units by mouth 2 (two) times daily.    . Fish Oil-Cholecalciferol (FISH OIL + D3) 1200-1000 MG-UNIT CAPS Take 1,200 mg by mouth daily.   . fluticasone (FLONASE) 50 MCG/ACT nasal spray Place 2 sprays into both nostrils daily.   Marland Kitchen glucose blood (ACCU-CHEK AVIVA PLUS) test strip Use as instructed D:E11.21 Check BG bid   . glucose blood (ONE TOUCH  ULTRA TEST) test strip USE AS INSTRUCTEDDX E11.65, LON 99 MONTHS CHECK FINGERSTICK BLOOD SUGARS THREE TIMES A DAY   . HUMALOG KWIKPEN 100 UNIT/ML KwikPen INJECT 10 UNITS TOTAL INTO THE SKIN 3 (THREE) TIMES DAILY.   Marland Kitchen insulin glargine (LANTUS SOLOSTAR) 100 UNIT/ML Solostar Pen INJECT 60 UNITS INTO THE SKIN DAILY. OR AS DIRECTED BY YOUR DOCTOR   . Insulin Pen Needle (B-D UF III MINI PEN NEEDLES) 31G X 5 MM MISC USE ONCE A DAY WITH LONG-ACTING INSULIN, AND UP TO 3X A DAY WITH SHORT-ACTING INSULIN   . JARDIANCE 25 MG TABS tablet TAKE 1 TABLET BY MOUTH DAILY. (HIGHER DOSE)   . Lancets (ACCU-CHEK MULTICLIX) lancets Use as instructed Dx Ell.21, Check BG bid   . Lancets (ONETOUCH ULTRASOFT) lancets Check fingerstick blood sugar three times a day; E11.65, LON 99 months   . levocetirizine (XYZAL) 5 MG tablet Take 1 tablet (5 mg total) by mouth every evening.   Marland Kitchen losartan (COZAAR) 100 MG tablet Take 1 tablet (100 mg total) by mouth daily.    No facility-administered encounter medications on file as of 12/07/2019.     Objective:  BP Readings from Last 3 Encounters:  11/17/19 133/76  11/11/19 132/70  10/15/19 122/82   Lab Results  Component Value Date   HGBA1C 7.3 (H) 10/15/2019    Lab Results  Component Value Date   CHOL 119 10/15/2019   HDL 36 (L) 10/15/2019   LDLCALC 69 10/15/2019   TRIG 62 10/15/2019   CHOLHDL 3.3 10/15/2019  Wt Readings from Last 3 Encounters:  11/17/19 280 lb (127 kg)  11/11/19 281 lb 6.4 oz (127.6 kg)  10/15/19 273 lb 3.2 oz (123.9 kg)    Fall Risk  12/07/2019 11/19/2019 11/11/2019 10/15/2019 08/14/2019  Falls in the past year? 0 0 0 0 0  Number falls in past yr: - 0 0 0 0  Injury with Fall? - 0 0 0 0  Risk for fall due to : - No Fall Risks - - -  Follow up Falls prevention discussed Falls prevention discussed - - -     Functional Status Survey: Is the patient deaf or have difficulty hearing?: Yes(Reports wearing hearing aids) Does the patient have difficulty  seeing, even when wearing glasses/contacts?: No Does the patient have difficulty concentrating, remembering, or making decisions?: No Does the patient have difficulty walking or climbing stairs?: No Does the patient have difficulty dressing or bathing?: No Does the patient have difficulty doing errands alone such as visiting a doctor's office or shopping?: No   Depression screen [facility] 2/9 12/07/2019 11/19/2019 11/11/2019  Decreased Interest 0 0 0  Down, Depressed, Hopeless 0 0 0  PHQ - 2 Score 0 0 0  Altered sleeping - - 0  Tired, decreased energy - - 0  Change in appetite - - 0  Feeling bad or failure about yourself  - - 0  Trouble concentrating - - 0  Moving slowly or fidgety/restless - - 0  Suicidal thoughts - - 0  PHQ-9 Score - - 0  Difficult doing work/chores - - Not difficult at all  Some recent data might be hidden    Goals Addressed            This Visit's Progress   . Chronic Care Management       CARE PLAN ENTRY (see longitudinal plan of care for additional care plan information)  Current Barriers:  . Chronic Disease Management support and education needs related Hypertension, Coronary Artery Disease, Hyperlipidemia and Diabetes.  Case Manager Clinical Goal(s):  Marland Kitchen Over the next 90 days, patient will take all medications as prescribed. . Over the next 90 days, patient will attend all medical appointments as scheduled. . Over the next 90 days, patient will monitor his blood pressure and record readings. . Over the next 90 days, patient will monitor his blood glucose daily and maintain a log. . Over the next 90 days, patient will work with the care management team to address concerns regarding financial restraints and need for additional services in the home.  Interventions:  . Inter-disciplinary care team collaboration (see longitudinal plan of care) . Reviewed medications. Encouraged to take medications as prescribed and notify care management team with concerns  regarding medication management. Advised to keep team informed of concerns regarding prescription costs. Pharmacy team provided assistance with obtaining insulin. Reports self administering medications without difficulties. . Provided education regarding established blood pressure parameters. Encouraged to monitor blood pressure and record readings. Encouraged to notify provider with readings outside of established parameters.  . Provided education regarding s/sx of hypoglycemia and hyperglycemia along with recommended interventions. Encouraged to monitor blood glucose at least twice a day (fasting and prior to evening insulin dose) and maintain a log. Reports fasting range of 121- 156 mg/dl over the past two weeks. Reading today was 129 mg/dl. . Discussed nutritional intake and cardiac prudent/diabetic diet options. Encouraged to continue attempted compliance with diet recommendations. Encourage to notify team with concerns regarding nutrition or need for assistance. Reports good  nutritional intake. Also report receiving delivery via [employer] Whe[employer]. . Reviewed pending appointments. Encouraged to attend appointments as scheduled to prevent delays in care. Encouraged to contact team with concerns regarding transportation. Denies current need for transportation assistance. Pending colonoscopy on 12/17/19. Marland Kitchen Discussed plan for ongoing care management. Confirmed receipt of information from [place] for utility assistance. Encouraged to keep care team informed of changes with care management needs.   Patient Self Care Activities:  . Self administers medications  . Attends scheduled provider appointments . Performs ADL's independently . Performs IADL's independently   Initial goal documentation        Mr. Thain was given information about Chronic Care Management services  including:  1. CCM service includes personalized support from designated clinical staff supervised by his physician, including  individualized plan of care and coordination with other care providers 2. 24/7 contact phone numbers for assistance for urgent and routine care needs. 3. Service will only be billed when office clinical staff spend 20 minutes or more in a month to coordinate care. 4. Only one practitioner may furnish and bill the service in a calendar month. 5. The patient may stop CCM services at any time (effective at the end of the month) by phone call to the office staff. 6. The patient will be responsible for cost sharing (co-pay) of up to 20% of the service fee (after annual deductible is met).  Patient agreed to services and verbal consent obtained.     PLAN  The care management team will follow-up with Mr. Honea later this month.    [facility] Care Management [phone]

## 2019-12-11 ENCOUNTER — Ambulatory Visit: Payer: Self-pay

## 2019-12-15 ENCOUNTER — Other Ambulatory Visit: Payer: Self-pay | Admitting: Pharmacy Technician

## 2019-12-15 ENCOUNTER — Other Ambulatory Visit
Admission: RE | Admit: 2019-12-15 | Discharge: 2019-12-15 | Disposition: A | Discharge status: Home / Self Care | Payer: Medicare PPO | Source: Ambulatory Visit | Attending: Gastroenterology | Admitting: Gastroenterology

## 2019-12-15 DIAGNOSIS — Z20822 Contact with and (suspected) exposure to covid-19: Secondary | ICD-10-CM | POA: Diagnosis not present

## 2019-12-15 DIAGNOSIS — Z01812 Encounter for preprocedural laboratory examination: Secondary | ICD-10-CM | POA: Diagnosis present

## 2019-12-15 LAB — SARS CORONAVIRUS 2 (TAT 6-24 HRS): SARS Coronavirus 2: NEGATIVE

## 2019-12-15 NOTE — Patient Outreach (Signed)
[facility]) Care Management  12/15/2019  [place] 1945-10-26 [id number]   Received patient portion(s) of patient assistance application(s) for Jardiance, Humalog and Basaglar. Faxed completed application and required documents into [employer] and [employer].  Will follow up with company(ies) in 10-14 business days to check status of application(s).  Maud Deed Chana Bode, [place] Certified Pharmacy Technician [employer]

## 2019-12-17 ENCOUNTER — Ambulatory Visit: Payer: Medicare PPO | Admitting: Anesthesiology

## 2019-12-17 ENCOUNTER — Encounter
Admission: RE | Disposition: A | Discharge status: Home / Self Care | Payer: Self-pay | Source: Ambulatory Visit | Attending: Gastroenterology

## 2019-12-17 ENCOUNTER — Encounter: Payer: Self-pay | Admitting: Gastroenterology

## 2019-12-17 ENCOUNTER — Other Ambulatory Visit: Payer: Self-pay

## 2019-12-17 ENCOUNTER — Ambulatory Visit
Admission: RE | Admit: 2019-12-17 | Discharge: 2019-12-17 | Disposition: A | Discharge status: Home / Self Care | Payer: Medicare PPO | Source: Ambulatory Visit | Attending: Gastroenterology | Admitting: Gastroenterology

## 2019-12-17 DIAGNOSIS — Z833 Family history of diabetes mellitus: Secondary | ICD-10-CM | POA: Diagnosis not present

## 2019-12-17 DIAGNOSIS — Z96642 Presence of left artificial hip joint: Secondary | ICD-10-CM | POA: Insufficient documentation

## 2019-12-17 DIAGNOSIS — Z8249 Family history of ischemic heart disease and other diseases of the circulatory system: Secondary | ICD-10-CM | POA: Diagnosis not present

## 2019-12-17 DIAGNOSIS — Z86711 Personal history of pulmonary embolism: Secondary | ICD-10-CM | POA: Insufficient documentation

## 2019-12-17 DIAGNOSIS — D125 Benign neoplasm of sigmoid colon: Secondary | ICD-10-CM | POA: Insufficient documentation

## 2019-12-17 DIAGNOSIS — K635 Polyp of colon: Secondary | ICD-10-CM

## 2019-12-17 DIAGNOSIS — I2510 Atherosclerotic heart disease of native coronary artery without angina pectoris: Secondary | ICD-10-CM | POA: Diagnosis not present

## 2019-12-17 DIAGNOSIS — I712 Thoracic aortic aneurysm, without rupture: Secondary | ICD-10-CM | POA: Insufficient documentation

## 2019-12-17 DIAGNOSIS — E119 Type 2 diabetes mellitus without complications: Secondary | ICD-10-CM | POA: Insufficient documentation

## 2019-12-17 DIAGNOSIS — Z6838 Body mass index (BMI) 38.0-38.9, adult: Secondary | ICD-10-CM | POA: Insufficient documentation

## 2019-12-17 DIAGNOSIS — Z1211 Encounter for screening for malignant neoplasm of colon: Secondary | ICD-10-CM | POA: Insufficient documentation

## 2019-12-17 DIAGNOSIS — E669 Obesity, unspecified: Secondary | ICD-10-CM | POA: Diagnosis not present

## 2019-12-17 DIAGNOSIS — I10 Essential (primary) hypertension: Secondary | ICD-10-CM | POA: Insufficient documentation

## 2019-12-17 DIAGNOSIS — Z860100 Personal history of colon polyps, unspecified: Secondary | ICD-10-CM

## 2019-12-17 DIAGNOSIS — Z79899 Other long term (current) drug therapy: Secondary | ICD-10-CM | POA: Diagnosis not present

## 2019-12-17 DIAGNOSIS — Z87891 Personal history of nicotine dependence: Secondary | ICD-10-CM | POA: Insufficient documentation

## 2019-12-17 DIAGNOSIS — Z794 Long term (current) use of insulin: Secondary | ICD-10-CM | POA: Diagnosis not present

## 2019-12-17 DIAGNOSIS — E785 Hyperlipidemia, unspecified: Secondary | ICD-10-CM | POA: Insufficient documentation

## 2019-12-17 DIAGNOSIS — D120 Benign neoplasm of cecum: Secondary | ICD-10-CM | POA: Insufficient documentation

## 2019-12-17 HISTORY — PX: COLONOSCOPY WITH PROPOFOL: SHX5780

## 2019-12-17 LAB — GLUCOSE, CAPILLARY: Glucose-Capillary: 154 mg/dL — ABNORMAL HIGH (ref 70–99)

## 2019-12-17 SURGERY — COLONOSCOPY WITH PROPOFOL
Anesthesia: General

## 2019-12-17 MED ORDER — PROPOFOL 500 MG/50ML IV EMUL
INTRAVENOUS | Status: AC
  Filled 2019-12-17: qty 50

## 2019-12-17 MED ORDER — SODIUM CHLORIDE 0.9 % IV SOLN
INTRAVENOUS | Status: DC
  Administered 2019-12-17: 1000 mL via INTRAVENOUS

## 2019-12-17 MED ORDER — PROPOFOL 10 MG/ML IV BOLUS
INTRAVENOUS | Status: DC | PRN
  Administered 2019-12-17 (×2): 50 mg via INTRAVENOUS

## 2019-12-17 MED ORDER — PROPOFOL 500 MG/50ML IV EMUL
INTRAVENOUS | Status: DC | PRN
  Administered 2019-12-17: 125 ug/kg/min via INTRAVENOUS

## 2019-12-17 NOTE — H&P (Signed)
Jonathon Bellows, MD [address] [place], [place], [place], [address] [address], [place], [place], [place], [address] Phone: [phone]  Fax: [phone]  Primary Care Physician:  Delsa Grana, PA-C   Pre-Procedure History & Physical: HPI:  Edgar [name] is a 74 y.o. male is here for an colonoscopy.   Past Medical History:  Diagnosis Date  . Aneurysm of aorta ([place]) 09/11/2016   Noted on CT scan Sept 2017  . 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 Coronary artery disease 10/25/2017   CT scan March 2019  . Diabetes mellitus without complication ([place])   . Essential hypertension, benign 12/23/2015  . History of colonoscopy with polypectomy 12/07/2016   Polypectomy x 12 per patient  . Hx of tobacco use, presenting hazards to health 12/23/2015   Quit prior to 1997  . Hyperlipidemia 12/23/2015  . Hypertension   . 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 Obesity 12/23/2015  . Pulmonary embolism ([place]) 05/18/2016   Hospitalization Sept 2017, following hip fracture    Past Surgical History:  Procedure Laterality Date  . COLONOSCOPY WITH PROPOFOL N/A 01/15/2017   Procedure: COLONOSCOPY WITH PROPOFOL;  Surgeon: Jonathon Bellows, MD;  Location: [facility] ENDOSCOPY;  Service: Endoscopy;  Laterality: N/A;  . EYE SURGERY    . FRACTURE SURGERY    . INTRAMEDULLARY (IM) NAIL INTERTROCHANTERIC Left 04/14/2016   Procedure: INTRAMEDULLARY (IM) NAIL INTERTROCHANTRIC;  Surgeon: Corky Mull, MD;  Location: [facility] ORS;  Service: Orthopedics;  Laterality: Left;  . JOINT REPLACEMENT Left 04/2016  . TOTAL HIP ARTHROPLASTY      Prior to Admission medications   Medication Sig Start Date End Date Taking? Authorizing Provider  atorvastatin (LIPITOR) 40 MG tablet Take 1 tablet (40 mg total) by mouth at bedtime. 04/14/19  Yes Delsa Grana, PA-C  Blood Glucose Monitoring Suppl (ACCU-CHEK AVIVA PLUS) w/Device KIT 1 Device by Does not apply route 2 (two) times daily. Dx E11.21 Check BG BID  08/18/19  Yes Delsa Grana, PA-C  Blood Glucose Monitoring Suppl (ONE TOUCH ULTRA 2) w/Device KIT Check fingerstick blood sugars three times a day; DX E11.65, LON 99 months 07/04/17  Yes Lada, Satira Anis, MD  cholecalciferol (VITAMIN D) 1000 units tablet Take 1,000 Units by mouth 2 (two) times daily.    Yes [provider]  Fish Oil-Cholecalciferol (FISH OIL + D3) 1200-1000 MG-UNIT CAPS Take 1,200 mg by mouth daily. 04/15/19  Yes Delsa Grana, PA-C  glucose blood (ACCU-CHEK AVIVA PLUS) test strip Use as instructed D:E11.21 Check BG bid 08/18/19  Yes Tapia, Leisa, PA-C  glucose blood (ONE TOUCH ULTRA TEST) test strip USE AS INSTRUCTEDDX E11.65, LON 99 MONTHS CHECK FINGERSTICK BLOOD SUGARS THREE TIMES A DAY 11/17/18  Yes Lada, Satira Anis, MD  HUMALOG KWIKPEN 100 UNIT/ML KwikPen INJECT 10 UNITS TOTAL INTO THE SKIN 3 (THREE) TIMES DAILY. 10/22/19  Yes Delsa Grana, PA-C  insulin glargine (LANTUS SOLOSTAR) 100 UNIT/ML Solostar Pen INJECT 60 UNITS INTO THE SKIN DAILY. OR AS DIRECTED BY YOUR DOCTOR 10/16/19  Yes Delsa Grana, PA-C  Insulin Pen Needle (B-D UF III MINI PEN NEEDLES) 31G X 5 MM MISC USE ONCE A DAY WITH LONG-ACTING INSULIN, AND UP TO 3X A DAY WITH SHORT-ACTING INSULIN 06/29/19  Yes Tapia, Leisa, PA-C  JARDIANCE 25 MG TABS tablet TAKE 1 TABLET BY MOUTH DAILY. (HIGHER DOSE) 01/19/19  Yes Hubbard Hartshorn, FNP  Lancets (ACCU-CHEK MULTICLIX) lancets Use as instructed Dx Ell.21, Check BG bid 08/18/19  Yes  Delsa Grana, PA-C  Lancets [facility] ULTRASOFT) lancets Check fingerstick blood sugar three times a day; E11.65, LON 99 months 07/04/17  Yes Lada, Satira Anis, MD  losartan (COZAAR) 100 MG tablet Take 1 tablet (100 mg total) by mouth daily. 04/14/19  Yes Delsa Grana, PA-C  acetaminophen (TYLENOL) 500 MG tablet Take 500 mg by mouth every 6 (six) hours as needed.    [provider]  amLODipine (NORVASC) 10 MG tablet Take 1 tablet (10 mg total) by mouth daily. Patient not taking: Reported on  12/17/2019 10/16/19   Delsa Grana, PA-C  fluticasone [facility]) 50 MCG/ACT nasal spray Place 2 sprays into both nostrils daily. 10/15/19   Delsa Grana, PA-C  levocetirizine (XYZAL) 5 MG tablet Take 1 tablet (5 mg total) by mouth every evening. 10/15/19   Delsa Grana, PA-C    Allergies as of 11/18/2019  . (No Known Allergies)    Family History  Problem Relation Age of Onset  . Cancer Mother   . Diabetes Mother   . Heart disease Father   . Alcohol abuse Brother     Social History   Socioeconomic History  . Marital status: Married    Spouse name: Not on file  . Number of children: Not on file  . Years of education: Not on file  . Highest education level: Not on file  Occupational History  . Not on file  Tobacco Use  . Smoking status: Former Smoker    Types: Cigarettes    Quit date: 09/01/1978    Years since quitting: 41.3  . Smokeless tobacco: Never Used  Substance and Sexual Activity  . Alcohol use: No    Alcohol/week: 0.0 standard drinks  . Drug use: No  . Sexual activity: Not Currently  Other Topics Concern  . Not on file  Social History Narrative  . Not on file   Social Determinants of Health   Financial Resource Strain: High Risk  . Difficulty of Paying Living Expenses: Hard  Food Insecurity: No Food Insecurity  . Worried About Charity fundraiser in the Last Year: Never true  . Ran Out of Food in the Last Year: Never true  Transportation Needs: No Transportation Needs  . Lack of Transportation (Medical): No  . Lack of Transportation (Non-Medical): No  Physical Activity: Insufficiently Active  . Days of Exercise per Week: 5 days  . Minutes of Exercise per Session: 20 min  Stress: No Stress Concern Present  . Feeling of Stress : Not at all  Social Connections: Slightly Isolated  . Frequency of Communication with Friends and Family: More than three times a week  . Frequency of Social Gatherings with Friends and Family: Three times a week  . Attends Religious  Services: 1 to 4 times per year  . Active Member of Clubs or Organizations: No  . Attends Archivist Meetings: Never  . Marital Status: Married  Human resources officer Violence: Not At Risk  . Fear of Current or Ex-Partner: No  . Emotionally Abused: No  . Physically Abused: No  . Sexually Abused: No    Review of Systems: See HPI, otherwise negative ROS  Physical Exam: BP 107/65   Pulse 80   Temp (!) 97.1 F (36.2 C) (Temporal)   Resp 20   Ht 6' (1.829 m)   Wt 127.3 kg   SpO2 97%   BMI 38.05 kg/m  General:   Alert,  pleasant and cooperative in NAD Head:  Normocephalic and atraumatic. Neck:  Supple;  no masses or thyromegaly. Lungs:  Clear throughout to auscultation, normal respiratory effort.    Heart:  +S1, +S2, Regular rate and rhythm, No edema. Abdomen:  Soft, nontender and nondistended. Normal bowel sounds, without guarding, and without rebound.   Neurologic:  Alert and  oriented x4;  grossly normal neurologically.  Impression/Plan: Edgar [name] is here for an colonoscopy to be performed for surveillance due to prior history of colon polyps   Risks, benefits, limitations, and alternatives regarding  colonoscopy have been reviewed with the patient.  Questions have been answered.  All parties agreeable.   Jonathon Bellows, MD  12/17/2019, 8:45 AM

## 2019-12-17 NOTE — Anesthesia Preprocedure Evaluation (Signed)
Anesthesia Evaluation  Patient identified by MRN, date of birth, ID band Patient awake    Reviewed: Allergy & Precautions, NPO status , Patient's Chart, lab work & pertinent test results  History of Anesthesia Complications Negative for: history of anesthetic complications  Airway Mallampati: III  TM Distance: >3 FB Neck ROM: Full    Dental no notable dental hx.    Pulmonary neg pulmonary ROS, former smoker,    breath sounds clear to auscultation       Cardiovascular Exercise Tolerance: Good hypertension, Pt. 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   Neuro/Psych neg Seizures negative psych ROS   GI/Hepatic negative GI ROS, Neg liver ROS, neg GERD  ,  Endo/Other  diabetes, Well Controlled, Type 2, Insulin DependentMorbid obesity  Renal/GU Renal disease     Musculoskeletal negative musculoskeletal ROS (+)   Abdominal   Peds  Hematology  (+) DOES NOT REFUSE BLOOD PRODUCTS,   Anesthesia Other Findings Past Medical History: No date: Diabetes mellitus without complication ([place]) 123456: Essential hypertension, benign 12/23/2015: Hx of tobacco use, presenting hazards to health     Comment: Quit prior to 1997 12/23/2015: Hyperlipidemia No date: Hypertension 12/23/2015: Obesity   Reproductive/Obstetrics                             Anesthesia Physical  Anesthesia Plan  ASA: III  Anesthesia Plan: General   Post-op Pain Management:    Induction: Intravenous  PONV Risk Score and Plan: Propofol infusion and TIVA  Airway Management Planned: Natural Airway and Nasal Cannula  Additional Equipment:   Intra-op Plan:   Post-operative Plan:   Informed Consent: I have reviewed the patients History and Physical,  chart, labs and discussed the procedure including the risks, benefits and alternatives for the proposed anesthesia with the patient or authorized representative who has indicated his/her understanding and acceptance.       Plan Discussed with: Anesthesiologist  Anesthesia Plan Comments:         Anesthesia Quick Evaluation

## 2019-12-17 NOTE — Transfer of Care (Signed)
Immediate Anesthesia Transfer of Care Note  Patient: Edgar [name]  Procedure(s) Performed: COLONOSCOPY WITH PROPOFOL (N/A )  Patient Location: PACU  Anesthesia Type:General  Level of Consciousness: awake  Airway & Oxygen Therapy: Patient Spontanous Breathing and Patient connected to nasal cannula oxygen  Post-op Assessment: Report given to RN and Post -op Vital signs reviewed and stable  Post vital signs: Reviewed  Last Vitals:  Vitals Value Taken Time  BP 100/67 12/17/19 0929  Temp 36.1 C 12/17/19 0929  Pulse 64 12/17/19 0929  Resp 19 12/17/19 0929  SpO2 95 % 12/17/19 0929  Vitals shown include unvalidated device data.  Last Pain:  Vitals:   12/17/19 0807  TempSrc: Temporal  PainSc: 0-No pain         Complications: No apparent anesthesia complications

## 2019-12-17 NOTE — Anesthesia Postprocedure Evaluation (Signed)
Anesthesia Post Note  Patient: Edgar [name]  Procedure(s) Performed: COLONOSCOPY WITH PROPOFOL (N/A )  Patient location during evaluation: Endoscopy Anesthesia Type: General Level of consciousness: awake and alert Pain management: pain level controlled Vital Signs Assessment: post-procedure vital signs reviewed and stable Respiratory status: spontaneous breathing, nonlabored ventilation, respiratory function stable and patient connected to nasal cannula oxygen Cardiovascular status: blood pressure returned to baseline and stable Postop Assessment: no apparent nausea or vomiting Anesthetic complications: no     Last Vitals:  Vitals:   12/17/19 0939 12/17/19 0949  BP: 112/77 118/76  Pulse: 60 (!) 59  Resp: (!) 21 15  Temp:    SpO2: 100% 99%    Last Pain:  Vitals:   12/17/19 0949  TempSrc:   PainSc: 0-No pain                 Martha Clan

## 2019-12-17 NOTE — Op Note (Addendum)
[facility] Gastroenterology Patient Name: Edgar [name] Procedure Date: 12/17/2019 8:46 AM MRN: [id number] Account #: [id number] Date of Birth: [birth year] Admit Type: Outpatient Age: 74 Room: [facility] ENDO ROOM 3 Gender: Male Note Status: Finalized Procedure:             Colonoscopy Indications:           High risk colon cancer surveillance: Personal history                         of colonic polyps Providers:             Jonathon Bellows MD, MD Referring MD:          Delsa Grana (Referring MD) Medicines:             Monitored Anesthesia Care Complications:         No immediate complications. Procedure:             Pre-Anesthesia Assessment:                        - Prior to the procedure, a History and Physical was                         performed, and patient medications, allergies and                         sensitivities were reviewed. The patient's tolerance                         of previous anesthesia was reviewed.                        - The risks and benefits of the procedure and the                         sedation options and risks were discussed with the                         patient. All questions were answered and informed                         consent was obtained.                        - ASA Grade Assessment: II - A patient with mild                         systemic disease.                        After obtaining informed consent, the colonoscope was                         passed under direct vision. Throughout the procedure,                         the patient's blood pressure, pulse, and oxygen                         saturations were monitored continuously. The  Colonoscope was introduced through the anus and                         advanced to the the cecum, identified by the                         appendiceal orifice. The colonoscopy was performed                         with ease. The patient tolerated the procedure  well.                         The quality of the bowel preparation was adequate to                         identify polyps. Findings:      The perianal and digital rectal examinations were normal.      Two sessile polyps were found in the sigmoid colon and cecum. The polyps       were 3 to 4 mm in size. These polyps were removed with a jumbo cold       forceps. Resection and retrieval were complete.      The exam was otherwise without abnormality on direct and retroflexion       views.      The exam was otherwise without abnormality on direct and retroflexion       views. Impression:            - Two 3 to 4 mm polyps in the sigmoid colon and in the                         cecum, removed with a jumbo cold forceps. Resected and                         retrieved.                        - The examination was otherwise normal on direct and                         retroflexion views.                        - The examination was otherwise normal on direct and                         retroflexion views. Recommendation:        - Discharge patient to home (with escort).                        - Resume previous diet.                        - Continue present medications.                        - Await pathology results.                        - Repeat colonoscopy in 5 years for surveillance. Procedure Code(s):     ---  Professional ---                        [phone], Colonoscopy, flexible; with biopsy, single or                         multiple Diagnosis Code(s):     --- Professional ---                        Z86.010, Personal history of colonic polyps                        K63.5, Polyp of colon CPT copyright 2019 American Medical Association. All rights reserved. The codes documented in this report are preliminary and upon coder review may  be revised to meet current compliance requirements. Jonathon Bellows, MD Jonathon Bellows MD, MD 12/17/2019 9:26:00 AM This report has been signed  electronically. Number of Addenda: 0 Note Initiated On: 12/17/2019 8:46 AM Scope Withdrawal Time: 0 hours 15 minutes 19 seconds  Total Procedure Duration: 0 hours 20 minutes 43 seconds  Estimated Blood Loss:  Estimated blood loss: none.      [facility]

## 2019-12-18 ENCOUNTER — Encounter: Payer: Self-pay | Admitting: *Deleted

## 2019-12-18 LAB — SURGICAL PATHOLOGY

## 2019-12-21 ENCOUNTER — Encounter: Payer: Self-pay | Admitting: Gastroenterology

## 2019-12-28 ENCOUNTER — Telehealth: Payer: Self-pay

## 2020-01-02 ENCOUNTER — Emergency Department: Payer: Medicare PPO

## 2020-01-02 ENCOUNTER — Encounter: Payer: Self-pay | Admitting: Emergency Medicine

## 2020-01-02 ENCOUNTER — Emergency Department
Admission: EM | Admit: 2020-01-02 | Discharge: 2020-01-02 | Disposition: A | Discharge status: Home / Self Care | Payer: Medicare PPO | Attending: Emergency Medicine | Admitting: Emergency Medicine

## 2020-01-02 ENCOUNTER — Other Ambulatory Visit: Payer: Self-pay

## 2020-01-02 DIAGNOSIS — S2220XA Unspecified fracture of sternum, initial encounter for closed fracture: Secondary | ICD-10-CM | POA: Diagnosis not present

## 2020-01-02 DIAGNOSIS — S299XXA Unspecified injury of thorax, initial encounter: Secondary | ICD-10-CM | POA: Diagnosis present

## 2020-01-02 DIAGNOSIS — Y999 Unspecified external cause status: Secondary | ICD-10-CM | POA: Insufficient documentation

## 2020-01-02 DIAGNOSIS — I2510 Atherosclerotic heart disease of native coronary artery without angina pectoris: Secondary | ICD-10-CM | POA: Insufficient documentation

## 2020-01-02 DIAGNOSIS — E119 Type 2 diabetes mellitus without complications: Secondary | ICD-10-CM | POA: Diagnosis not present

## 2020-01-02 DIAGNOSIS — I10 Essential (primary) hypertension: Secondary | ICD-10-CM | POA: Insufficient documentation

## 2020-01-02 DIAGNOSIS — Y9389 Activity, other specified: Secondary | ICD-10-CM | POA: Diagnosis not present

## 2020-01-02 DIAGNOSIS — Z96642 Presence of left artificial hip joint: Secondary | ICD-10-CM | POA: Insufficient documentation

## 2020-01-02 DIAGNOSIS — Y92410 Unspecified street and highway as the place of occurrence of the external cause: Secondary | ICD-10-CM | POA: Insufficient documentation

## 2020-01-02 LAB — BASIC METABOLIC PANEL
Anion gap: 6 (ref 5–15)
BUN: 13 mg/dL (ref 8–23)
CO2: 25 mmol/L (ref 22–32)
Calcium: 9.8 mg/dL (ref 8.9–10.3)
Chloride: 108 mmol/L (ref 98–111)
Creatinine, Ser: 0.98 mg/dL (ref 0.61–1.24)
GFR calc Af Amer: >60 mL/min (ref >60)
GFR calc non Af Amer: >60 mL/min (ref >60)
Glucose, Bld: 102 mg/dL — ABNORMAL HIGH (ref 70–99)
Potassium: 3.6 mmol/L (ref 3.5–5.1)
Sodium: 139 mmol/L (ref 135–145)

## 2020-01-02 LAB — CBC
HCT: 45.7 % (ref 39.0–52.0)
Hemoglobin: 15 g/dL (ref 13.0–17.0)
MCH: 28.4 pg (ref 26.0–34.0)
MCHC: 32.8 g/dL (ref 30.0–36.0)
MCV: 86.4 fL (ref 80.0–100.0)
Platelets: 217 10*3/uL (ref 150–400)
RBC: 5.29 MIL/uL (ref 4.22–5.81)
RDW: 14.2 % (ref 11.5–15.5)
WBC: 8.8 10*3/uL (ref 4.0–10.5)
nRBC: 0 % (ref 0.0–0.2)

## 2020-01-02 LAB — TROPONIN I (HIGH SENSITIVITY)
Troponin I (High Sensitivity): 20 ng/L — ABNORMAL HIGH (ref <18)
Troponin I (High Sensitivity): 24 ng/L — ABNORMAL HIGH (ref <18)
Troponin I (High Sensitivity): 21 ng/L — ABNORMAL HIGH (ref <18)

## 2020-01-02 MED ORDER — IOHEXOL 300 MG/ML  SOLN
75.0000 mL | Freq: Once | INTRAMUSCULAR | Status: AC | PRN
  Administered 2020-01-02: 75 mL via INTRAVENOUS

## 2020-01-02 MED ORDER — ACETAMINOPHEN 500 MG PO TABS
1000.0000 mg | ORAL_TABLET | Freq: Once | ORAL | Status: AC
  Administered 2020-01-02: 1000 mg via ORAL
  Filled 2020-01-02: qty 2

## 2020-01-02 NOTE — ED Notes (Signed)
EDP at bedside  

## 2020-01-02 NOTE — ED Notes (Signed)
Attempted to stick x 1 unsuccessful, zach EDT to look.
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
## 2020-01-02 NOTE — ED Notes (Signed)
Pt taken to CT at this time.
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
## 2020-01-07 ENCOUNTER — Other Ambulatory Visit: Payer: Self-pay | Admitting: Family Medicine

## 2020-01-07 DIAGNOSIS — J310 Chronic rhinitis: Secondary | ICD-10-CM

## 2020-01-11 ENCOUNTER — Other Ambulatory Visit: Payer: Self-pay

## 2020-01-11 MED ORDER — EMPAGLIFLOZIN 25 MG PO TABS: ORAL_TABLET | ORAL | 3 refills | Status: DC

## 2020-01-15 ENCOUNTER — Telehealth: Payer: Self-pay

## 2020-01-15 ENCOUNTER — Ambulatory Visit: Payer: Medicare PPO | Admitting: Family Medicine

## 2020-01-15 ENCOUNTER — Ambulatory Visit: Payer: Self-pay

## 2020-01-15 NOTE — Chronic Care Management (AMB) (Signed)
This encounter was created in error. Please disregard. 
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
## 2020-01-20 ENCOUNTER — Ambulatory Visit: Payer: Self-pay

## 2020-01-20 ENCOUNTER — Telehealth: Payer: Self-pay

## 2020-01-20 NOTE — Chronic Care Management (AMB) (Signed)
  Chronic Care Management   Outreach Note  01/20/2020 Name: KOKI BUXTON MRN: [id number] DOB: [birth year]  Primary Care Provider: Delsa Grana, PA-C Reason for referral : Chronic Care Management   An unsuccessful telephone outreach was attempted today. Mr. Maclin is currently engaged with the chronic care management team.   A HIPAA compliant voice message was left today requesting a return call.   Follow Up Plan: The care management team will reach out again within the next week.   [facility] Care Management [phone]

## 2020-01-22 ENCOUNTER — Telehealth: Payer: Self-pay

## 2020-01-22 ENCOUNTER — Ambulatory Visit: Payer: Self-pay

## 2020-01-22 NOTE — Chronic Care Management (AMB) (Signed)
Patient unavailable. Please disregard.

## 2020-01-27 ENCOUNTER — Other Ambulatory Visit: Payer: Self-pay | Admitting: Family Medicine

## 2020-01-27 DIAGNOSIS — I10 Essential (primary) hypertension: Secondary | ICD-10-CM

## 2020-02-05 ENCOUNTER — Ambulatory Visit (INDEPENDENT_AMBULATORY_CARE_PROVIDER_SITE_OTHER): Payer: Medicare PPO

## 2020-02-05 DIAGNOSIS — E1121 Type 2 diabetes mellitus with diabetic nephropathy: Secondary | ICD-10-CM

## 2020-02-05 DIAGNOSIS — Z794 Long term (current) use of insulin: Secondary | ICD-10-CM
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
## 2020-02-15 ENCOUNTER — Ambulatory Visit: Payer: Medicare PPO

## 2020-02-15 NOTE — Chronic Care Management (AMB) (Signed)
  Chronic Care Management   Note  02/15/2020 Name: Edgar [name] MRN: [id number] DOB: 12/11/1945      Care Coordination Only: Education and Resource documents submitted.       Follow up plan: The care management team will follow-up with Edgar [name] as scheduled in September.      [facility] Care Management [phone]

## 2020-02-18 ENCOUNTER — Other Ambulatory Visit: Payer: Self-pay

## 2020-02-18 ENCOUNTER — Ambulatory Visit: Payer: Medicare PPO | Admitting: Podiatry

## 2020-02-18 ENCOUNTER — Encounter: Payer: Self-pay | Admitting: Podiatry

## 2020-02-18 DIAGNOSIS — E0843 Diabetes mellitus due to underlying condition with diabetic autonomic (poly)neuropathy: Secondary | ICD-10-CM | POA: Diagnosis not present

## 2020-02-18 DIAGNOSIS — B351 Tinea unguium: Secondary | ICD-10-CM | POA: Diagnosis not present

## 2020-02-18 DIAGNOSIS — M79676 Pain in unspecified toe(s): Secondary | ICD-10-CM

## 2020-02-18 NOTE — Progress Notes (Signed)
This patient returns to my office for at risk foot care.  This patient requires this care by a professional since this patient will be at risk due to having  Diabetes with diabetic nephropathy.  This patient is unable to cut nails himself since the patient cannot reach his nails.These nails are painful walking and wearing shoes.  This patient presents for at risk foot care today.  General Appearance  Alert, conversant and in no acute stress.  Vascular  Dorsalis pedis and posterior tibial  pulses are palpable  bilaterally.  Capillary return is within normal limits  bilaterally. Temperature is within normal limits  bilaterally.  Neurologic  Senn-Weinstein monofilament wire test within normal limits  bilaterally. Muscle power within normal limits bilaterally.  Nails Thick disfigured discolored nails with subungual debris  from hallux to fifth toes bilaterally. No evidence of bacterial infection or drainage bilaterally.  Orthopedic  No limitations of motion  feet .  No crepitus or effusions noted.  No bony pathology or digital deformities noted.  Skin  normotropic skin with no porokeratosis noted bilaterally.  No signs of infections or ulcers noted.     Onychomycosis  Pain in right toes  Pain in left toes  Consent was obtained for treatment procedures.   Mechanical debridement of nails 1-5  bilaterally performed with a nail nipper.  Filed with dremel without incident.    Return office visit   3 months                   Told patient to return for periodic foot care and evaluation due to potential at risk complications.   Krissia Schreier DPM  

## 2020-02-28 ENCOUNTER — Other Ambulatory Visit: Payer: Self-pay | Admitting: Family Medicine

## 2020-04-22 ENCOUNTER — Ambulatory Visit (INDEPENDENT_AMBULATORY_CARE_PROVIDER_SITE_OTHER): Payer: Medicare PPO

## 2020-04-22 DIAGNOSIS — E782 Mixed hyperlipidemia: Secondary | ICD-10-CM | POA: Diagnosis not present

## 2020-04-22 DIAGNOSIS — Z794 Long term (current) use of insulin: Secondary | ICD-10-CM | POA: Diagnosis not present

## 2020-04-22 DIAGNOSIS — E1121 Type 2 diabetes mellitus with diabetic nephropathy: Secondary | ICD-10-CM
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
## 2020-04-23 ENCOUNTER — Other Ambulatory Visit: Payer: Self-pay | Admitting: Family Medicine

## 2020-04-23 DIAGNOSIS — E782 Mixed hyperlipidemia: Secondary | ICD-10-CM
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
## 2020-04-27 ENCOUNTER — Ambulatory Visit: Payer: Medicare PPO

## 2020-04-28 NOTE — Progress Notes (Signed)
Name: Edgar [name]   MRN: [id number]    DOB: March 17, 1946   Date:04/29/2020       Progress Note  Chief Complaint  Patient presents with  . Follow-up  . Diabetes  . Hypertension  . Hyperlipidemia     Subjective:   Edgar [name] is a 74 y.o. male, presents to clinic for routine f/up  DM:  IDDM has been fairly well controlled and near goal - though pt has been a difficult historian, does not follow med advise regarding blood sugar monitoring - and he has reassured me many times that he has "done this for years" and when he finally came into office for in person appt and finally got labs done - things were fairly well controlled. However today he reports that he cannot afford his insulin - he is on lantus 60 units once daily and humalog 10 u TID without monitoring blood sugars He says the pharmacy has messed up his medication Denies: Polyuria, polydipsia, vision changes, neuropathy, hypoglycemia Recent pertinent labs: Lab Results  Component Value Date   HGBA1C 7.3 (H) 10/15/2019   HGBA1C 7.3 (H) 04/14/2019   HGBA1C 7.2 (H) 12/02/2018   Standard of care and health maintenance: Urine Microalbumin: 10/15/19 Foot exam:  10/15/19 DM eye exam:  10/22/19   Hypertension:  Currently managed on Amlodopine 44m qd and Losartan 1036mqd Pt reports good med compliance and denies any SE.   Blood pressure today is well controlled. BP Readings from Last 3 Encounters:  04/29/20 126/78  01/02/20 133/78  12/17/19 118/76   Pt denies CP, SOB, exertional sx, LE edema, palpitation, Ha's, visual disturbances, lightheadedness, hypotension, syncope. Dietary efforts for BP?  Diet very poor right now  Hyperlipidemia:  Currently treated with Lipitor 4025md, pt reports good med compliance Last Lipids: Lab Results  Component Value Date   CHOL 119 10/15/2019   HDL 36 (L) 10/15/2019   LDLCALC 69 10/15/2019   TRIG 62 10/15/2019   CHOLHDL 3.3 10/15/2019   - Denies: Chest pain, shortness of  breath, myalgias, claudication   Current Outpatient Medications:  .  acetaminophen (TYLENOL) 500 MG tablet, Take 500 mg by mouth every 6 (six) hours as needed., Disp: , Rfl:  .  amLODipine (NORVASC) 10 MG tablet, Take 1 tablet (10 mg total) by mouth daily., Disp: 90 tablet, Rfl: 3 .  atorvastatin (LIPITOR) 40 MG tablet, TAKE 1 TABLET BY MOUTH EVERYDAY AT BEDTIME, Disp: 90 tablet, Rfl: 3 .  Blood Glucose Monitoring Suppl (ACCU-CHEK AVIVA PLUS) w/Device KIT, 1 Device by Does not apply route 2 (two) times daily. Dx E11.21 Check BG BID, Disp: 1 kit, Rfl: 0 .  Blood Glucose Monitoring Suppl (ONE TOUCH ULTRA 2) w/Device KIT, Check fingerstick blood sugars three times a day; DX E11.65, LON 99 months, Disp: 1 each, Rfl: 0 .  cholecalciferol (VITAMIN D) 1000 units tablet, Take 1,000 Units by mouth 2 (two) times daily. , Disp: , Rfl:  .  empagliflozin (JARDIANCE) 25 MG TABS tablet, TAKE 1 TABLET BY MOUTH DAILY. (HIGHER DOSE), Disp: 90 tablet, Rfl: 3 .  fluticasone (FLONASE) 50 MCG/ACT nasal spray, Place 2 sprays into both nostrils daily., Disp: 16 g, Rfl: 6 .  glucose blood (ACCU-CHEK AVIVA PLUS) test strip, Use as instructed D:E11.21 Check BG bid, Disp: 100 each, Rfl: 12 .  glucose blood (ONE TOUCH ULTRA TEST) test strip, USE AS INSTRUCTEDDX E11.65, LON 99 MONTHS CHECK FINGERSTICK BLOOD SUGARS THREE TIMES A DAY, Disp: 300 each, Rfl: 11 .  HUMALOG KWIKPEN 100 UNIT/ML KwikPen, INJECT 10 UNITS TOTAL INTO THE SKIN 3 (THREE) TIMES DAILY., Disp: 15 mL, Rfl: 6 .  insulin glargine (LANTUS SOLOSTAR) 100 UNIT/ML Solostar Pen, INJECT 60 UNITS INTO THE SKIN DAILY. OR AS DIRECTED BY YOUR DOCTOR, Disp: 15 mL, Rfl: 2 .  Insulin Pen Needle (B-D UF III MINI PEN NEEDLES) 31G X 5 MM MISC, USE ONCE A DAY WITH LONG-ACTING INSULIN, AND UP TO 3X A DAY WITH SHORT-ACTING INSULIN, Disp: 300 each, Rfl: 3 .  Lancets (ACCU-CHEK MULTICLIX) lancets, Use as instructed Dx Ell.21, Check BG bid, Disp: 100 each, Rfl: 12 .  Lancets (ONETOUCH  ULTRASOFT) lancets, Check fingerstick blood sugar three times a day; E11.65, LON 99 months, Disp: 100 each, Rfl: 12 .  levocetirizine (XYZAL) 5 MG tablet, TAKE 1 TABLET BY MOUTH EVERY DAY IN THE EVENING, Disp: 90 tablet, Rfl: 3 .  losartan (COZAAR) 100 MG tablet, TAKE 1 TABLET BY MOUTH EVERY DAY, Disp: 90 tablet, Rfl: 3  Patient Active Problem List   Diagnosis Date Noted  . Coronary artery disease 10/25/2017  . 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[place]) 05/14/2017  . BRBPR (bright red blood per rectum) 12/07/2016  . History of colonoscopy with polypectomy 12/07/2016  . Medication monitoring encounter 12/07/2016  . Fatty liver 09/11/2016  . Aneurysm of aorta ([place]) 09/11/2016  . 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 Tinea pedis 06/25/2016  . Closed intertrochanteric fracture of left hip, sequela 05/30/2016  . Hx of pulmonary embolus 05/18/2016  . Essential hypertension, benign 12/23/2015  . Hyperlipidemia 12/23/2015  . Hx of tobacco use, presenting hazards to health 12/23/2015  . Morbid obesity ([place]) 12/23/2015  . Type 2 diabetes mellitus with diabetic nephropathy, 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[place]) 01/25/2015    Past Surgical History:  Procedure Laterality Date  . COLONOSCOPY WITH PROPOFOL N/A 01/15/2017   Procedure: COLONOSCOPY WITH PROPOFOL;  Surgeon: Jonathon Bellows, MD;  Location[facility] ENDOSCOPY;  Service: Endoscopy;  Laterality: N/A;  . COLONOSCOPY WITH PROPOFOL N/A 12/17/2019   Procedure: COLONOSCOPY WITH PROPOFOL;  Surgeon: Jonathon Bellows, MD;  Location: [facility] ENDOSCOPY;  Service: Gastroenterology;  Laterality: N/A;  . EYE SURGERY    . FRACTURE SURGERY    . INTRAMEDULLARY (IM) NAIL INTERTROCHANTERIC Left 04/14/2016   Procedure: INTRAMEDULLARY (IM) NAIL INTERTROCHANTRIC;  Surgeon: Corky Mull, MD;  Location: [facility] ORS;  Service: Orthopedics;  Laterality: Left;  . JOINT REPLACEMENT Left 04/2016  . TOTAL HIP ARTHROPLASTY       Family History  Problem Relation Age of Onset  . Cancer Mother   . Diabetes Mother   . Heart disease Father   . Alcohol abuse Brother     Social History   Tobacco Use  . Smoking status: Former Smoker    Types: Cigarettes    Quit date: 09/01/1978    Years since quitting: 41.6  . Smokeless tobacco: Never Used  Vaping Use  . Vaping Use: Never used  Substance Use Topics  . Alcohol use: No    Alcohol/week: 0.0 standard drinks  . Drug use: No     No Known Allergies  Health Maintenance  Topic Date Due  . HEMOGLOBIN A1C  04/16/2020  . TETANUS/TDAP  08/13/2020 (Originally 05/14/2019)  . FOOT EXAM  10/14/2020  . OPHTHALMOLOGY EXAM  10/21/2020  . COLONOSCOPY  12/17/2024  . INFLUENZA VACCINE  Completed  . COVID-19 Vaccine  Completed  . Hepatitis C Screening  Completed  .  PNA vac Low Risk Adult  Completed    Chart Review Today: I personally reviewed active problem list, medication list, allergies, family history, social history, health maintenance, notes from last encounter, lab results, imaging with the patient/caregiver today.   Review of Systems  10 Systems reviewed and are negative for acute change except as noted in the HPI.  Objective:   Vitals:   04/29/20 0915  BP: 126/78  Pulse: 84  Resp: 18  Temp: 98.2 F (36.8 C)  TempSrc: Oral  SpO2: 96%  Weight: 280 lb 11.2 oz (127.3 kg)  Height: 6' (1.829 m)    Body mass index is 38.07 kg/m.  Physical Exam Vitals and nursing note reviewed.  Constitutional:      General: He is not in acute distress.    Appearance: He is obese. He is not toxic-appearing or diaphoretic.  HENT:     Head: Normocephalic and atraumatic.  Eyes:     General:        Right eye: No discharge.        Left eye: No discharge.     Conjunctiva/sclera: Conjunctivae normal.  Cardiovascular:     Rate and Rhythm: Normal rate and regular rhythm.     Pulses: Normal pulses.     Heart sounds: Normal heart sounds.  Pulmonary:     Effort:  Pulmonary effort is normal.     Breath sounds: Normal breath sounds.  Abdominal:     General: Bowel sounds are normal.     Palpations: Abdomen is soft.     Tenderness: There is no abdominal tenderness.     Comments: Obese protuberant abdomen  Skin:    General: Skin is warm and dry.     Coloration: Skin is not jaundiced or pale.  Neurological:     Mental Status: He is alert. Mental status is at baseline.  Psychiatric:        Mood and Affect: Mood normal.        Behavior: Behavior normal.         Assessment & Plan:   1. Type 2 diabetes mellitus with diabetic nephropathy, 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[place]) Patient has been unable to get his insulin he was given sample of long-acting insulin here, will consult pharmacy for patient assistance.  Recheck A1c.  Though the patient is slightly difficult in the past he is been a poor historian reassured me that he knows what he is doing not checking his blood sugars though administering mealtime insulin and he has had a history of his A1c being well controlled (for age-near goal) however he has gained some weight he has been eating very poorly and is run out of his medications A1c will likely be worse and uncontrolled we will need to work closely with our resources here to help get him his medications - Hemoglobin A1C - COMPLETE METABOLIC PANEL WITH GFR - Referral to Chronic Care Management Services  2. Essential hypertension, benign Stable, well controlled, pressure at goal today no symptoms or concerns - COMPLETE METABOLIC PANEL WITH GFR  3. Mixed hyperlipidemia Patient states he is compliant with his statin medication, no myalgias side effects or concerns, last lipids reviewed and at goal will repeat annually less there are any medication or significant diet or lifestyle changes - COMPLETE METABOLIC PANEL WITH GFR  4. Morbid obesity ([place]) With multiple comorbidities including uncontrolled IDDM, HTN, HLD Patient's weight has  been fairly stable although he states he has gained some weight and has  been eating very poorly, he has been caring for his wife who is critically ill for quite a long time in [facility] and he is try to take care of her and he does not cook so he is eating a lot more fast food, discussed that her options are available at grocery stores or fast food restaurants  Wt Readings from Last 5 Encounters:  04/29/20 280 lb 11.2 oz (127.3 kg)  01/02/20 240 lb (108.9 kg)  12/17/19 280 lb 9.3 oz (127.3 kg)  11/17/19 280 lb (127 kg)  11/11/19 281 lb 6.4 oz (127.6 kg)   BMI Readings from Last 5 Encounters:  04/29/20 38.07 kg/m  01/02/20 32.55 kg/m  12/17/19 38.05 kg/m  11/17/19 37.97 kg/m  11/11/19 38.16 kg/m   5. Encounter for medication monitoring - Hemoglobin A1C - COMPLETE METABOLIC PANEL WITH GFR - CBC with Differential/Platelet  6. Need for influenza vaccination - Flu Vaccine QUAD High Dose(Fluad)  7. Thoracic aortic aneurysm without rupture ([place]) On statin, compliant, monitoring with vascular specialist   Return in about 3 months (around 07/29/2020) for DM/HTN - bring medicines and your glucometer.   Delsa Grana, PA-C 04/29/20 9:57 AM

## 2020-04-29 ENCOUNTER — Other Ambulatory Visit: Payer: Self-pay

## 2020-04-29 ENCOUNTER — Telehealth: Payer: Self-pay | Admitting: *Deleted

## 2020-04-29 ENCOUNTER — Ambulatory Visit: Payer: Medicare PPO | Admitting: Family Medicine

## 2020-04-29 ENCOUNTER — Encounter: Payer: Self-pay | Admitting: Family Medicine

## 2020-04-29 VITALS — BP 126/78 | HR 84 | Temp 98.2°F | Resp 18 | Ht 72.0 in | Wt 280.7 lb

## 2020-04-29 DIAGNOSIS — I712 Thoracic aortic aneurysm, without rupture, unspecified: Secondary | ICD-10-CM

## 2020-04-29 DIAGNOSIS — I10 Essential (primary) hypertension: Secondary | ICD-10-CM

## 2020-04-29 DIAGNOSIS — E782 Mixed hyperlipidemia: Secondary | ICD-10-CM

## 2020-04-29 DIAGNOSIS — E1121 Type 2 diabetes mellitus with diabetic nephropathy: Secondary | ICD-10-CM | POA: Diagnosis not present

## 2020-04-29 DIAGNOSIS — Z23 Encounter for immunization: Secondary | ICD-10-CM

## 2020-04-29 DIAGNOSIS — Z794 Long term (current) use of insulin: Secondary | ICD-10-CM

## 2020-04-29 DIAGNOSIS — Z5181 Encounter for therapeutic drug level monitoring: Secondary | ICD-10-CM

## 2020-04-29 NOTE — Chronic Care Management (AMB) (Signed)
  Chronic Care Management   Note  04/29/2020 Name: Edgar [name] MRN: [id number] DOB: [birth year]  Edgar [name] is a 74 y.o. year old male who is a primary care patient of Delsa Grana, [place]. Edgar [name] is currently enrolled in care management services. An additional referral for Pharmacy was placed.   Follow up plan: Telephone appointment with care management team member scheduled for:05/03/2020  [place] Management

## 2020-04-29 NOTE — Patient Instructions (Signed)
Bring all your medications with you and bring your blood sugar meter to your next appointment.  If you have blood sugars that are over 200 - you need to come get a sooner follow up appointment and cannot wait 3 months   Blood Glucose Monitoring, Adult Monitoring your blood sugar (glucose) is an important part of managing your diabetes (diabetes mellitus). Blood glucose monitoring involves checking your blood glucose as often as directed and keeping a record (log) of your results over time. Checking your blood glucose regularly and keeping a blood glucose log can:  Help you and your health care provider adjust your diabetes management plan as needed, including your medicines or insulin.  Help you understand how food, exercise, illnesses, and medicines affect your blood glucose.  Let you know what your blood glucose is at any time. You can quickly find out if you have low blood glucose (hypoglycemia) or high blood glucose (hyperglycemia). Your health care provider will set individualized treatment goals for you. Your goals will be based on your age, other medical conditions you have, and how you respond to diabetes treatment. Generally, the goal of treatment is to maintain the following blood glucose levels:  Before meals (preprandial): 80-130 mg/dL (4.4-7.2 mmol/L).  After meals (postprandial): below 180 mg/dL (10 mmol/L).  A1c level: less than 7%. Supplies needed:  Blood glucose meter.  Test strips for your meter. Each meter has its own strips. You must use the strips that came with your meter.  A needle to prick your finger (lancet). Do not use a lancet more than one time.  A device that holds the lancet (lancing device).  A journal or log book to write down your results. How to check your blood glucose  1. Wash your hands with soap and water. 2. Prick the side of your finger (not the tip) with the lancet. Use a different finger each time. 3. Gently rub the finger until a small  drop of blood appears. 4. Follow instructions that come with your meter for inserting the test strip, applying blood to the strip, and using your blood glucose meter. 5. Write down your result and any notes. Some meters allow you to use areas of your body other than your finger (alternative sites) to test your blood. The most common alternative sites are:  Forearm.  Thigh.  Palm of the hand. If you think you may have hypoglycemia, or if you have a history of not knowing when your blood glucose is getting low (hypoglycemia unawareness), do not use alternative sites. Use your finger instead. Alternative sites may not be as accurate as the fingers, because blood flow is slower in these areas. This means that the result you get may be delayed, and it may be different from the result that you would get from your finger. Follow these instructions at home: Blood glucose log   Every time you check your blood glucose, write down your result. Also write down any notes about things that may be affecting your blood glucose, such as your diet and exercise for the day. This information can help you and your health care provider: ? Look for patterns in your blood glucose over time. ? Adjust your diabetes management plan as needed.  Check if your meter allows you to download your records to a computer. Most glucose meters store a record of glucose readings in the meter. If you have type 1 diabetes:  Check your blood glucose 2 or more times a day.  Also  check your blood glucose: ? Before every insulin injection. ? Before and after exercise. ? Before meals. ? 2 hours after a meal. ? Occasionally between 2:00 a.m. and 3:00 a.m., as directed. ? Before potentially dangerous tasks, like driving or using heavy machinery. ? At bedtime.  You may need to check your blood glucose more often, up to 6-10 times a day, if you: ? Use an insulin pump. ? Need multiple daily injections (MDI). ? Have diabetes that  is not well-controlled. ? Are ill. ? Have a history of severe hypoglycemia. ? Have hypoglycemia unawareness. If you have type 2 diabetes:  If you take insulin or other diabetes medicines, check your blood glucose 2 or more times a day.  If you are on intensive insulin therapy, check your blood glucose 4 or more times a day. Occasionally, you may also need to check between 2:00 a.m. and 3:00 a.m., as directed.  Also check your blood glucose: ? Before and after exercise. ? Before potentially dangerous tasks, like driving or using heavy machinery.  You may need to check your blood glucose more often if: ? Your medicine is being adjusted. ? Your diabetes is not well-controlled. ? You are ill. General tips  Always keep your supplies with you.  If you have questions or need help, all blood glucose meters have a 24-hour "hotline" phone number that you can call. You may also contact your health care provider.  After you use a few boxes of test strips, adjust (calibrate) your blood glucose meter by following instructions that came with your meter. Contact a health care provider if:  Your blood glucose is at or above 240 mg/dL (13.3 mmol/L) for 2 days in a row.  You have been sick or have had a fever for 2 days or longer, and you are not getting better.  You have any of the following problems for more than 6 hours: ? You cannot eat or drink. ? You have nausea or vomiting. ? You have diarrhea. Get help right away if:  Your blood glucose is lower than 54 mg/dL (3 mmol/L).  You become confused or you have trouble thinking clearly.  You have difficulty breathing.  You have moderate or large ketone levels in your urine. Summary  Monitoring your blood sugar (glucose) is an important part of managing your diabetes (diabetes mellitus).  Blood glucose monitoring involves checking your blood glucose as often as directed and keeping a record (log) of your results over time.  Your health  care provider will set individualized treatment goals for you. Your goals will be based on your age, other medical conditions you have, and how you respond to diabetes treatment.  Every time you check your blood glucose, write down your result. Also write down any notes about things that may be affecting your blood glucose, such as your diet and exercise for the day. This information is not intended to replace advice given to you by your health care provider. Make sure you discuss any questions you have with your health care provider. Document Revised: 05/16/2018 Document Reviewed: 01/02/2016 Elsevier Patient Education  2020 [employer].

## 2020-04-30 LAB — COMPLETE METABOLIC PANEL WITH GFR
AG Ratio: 1.1 (calc) (ref 1.0–2.5)
ALT: 17 U/L (ref 9–46)
AST: 20 U/L (ref 10–35)
Albumin: 3.9 g/dL (ref 3.6–5.1)
Alkaline phosphatase (APISO): 70 U/L (ref 35–144)
BUN: 12 mg/dL (ref 7–25)
CO2: 25 mmol/L (ref 20–32)
Calcium: 10.3 mg/dL (ref 8.6–10.3)
Chloride: 106 mmol/L (ref 98–110)
Creat: 0.96 mg/dL (ref 0.70–1.18)
GFR, Est African American: 91 mL/min/{1.73_m2} (ref >=60)
GFR, Est Non African American: 78 mL/min/{1.73_m2} (ref >=60)
Globulin: 3.4 g/dL (calc) (ref 1.9–3.7)
Glucose, Bld: 131 mg/dL — ABNORMAL HIGH (ref 65–99)
Potassium: 4.4 mmol/L (ref 3.5–5.3)
Sodium: 139 mmol/L (ref 135–146)
Total Bilirubin: 0.6 mg/dL (ref 0.2–1.2)
Total Protein: 7.3 g/dL (ref 6.1–8.1)

## 2020-04-30 LAB — CBC WITH DIFFERENTIAL/PLATELET
Absolute Monocytes: 435 cells/uL (ref 200–950)
Basophils Absolute: 17 cells/uL (ref 0–200)
Basophils Relative: 0.3 %
Eosinophils Absolute: 151 cells/uL (ref 15–500)
Eosinophils Relative: 2.6 %
HCT: 44.1 % (ref 38.5–50.0)
Hemoglobin: 14.3 g/dL (ref 13.2–17.1)
Lymphs Abs: 1717 cells/uL (ref 850–3900)
MCH: 28.5 pg (ref 27.0–33.0)
MCHC: 32.4 g/dL (ref 32.0–36.0)
MCV: 87.8 fL (ref 80.0–100.0)
MPV: 9.6 fL (ref 7.5–12.5)
Monocytes Relative: 7.5 %
Neutro Abs: 3480 cells/uL (ref 1500–7800)
Neutrophils Relative %: 60 %
Platelets: 240 10*3/uL (ref 140–400)
RBC: 5.02 10*6/uL (ref 4.20–5.80)
RDW: 13.9 % (ref 11.0–15.0)
Total Lymphocyte: 29.6 %
WBC: 5.8 10*3/uL (ref 3.8–10.8)

## 2020-04-30 LAB — HEMOGLOBIN A1C
Hgb A1c MFr Bld: 7.6 % of total Hgb — ABNORMAL HIGH (ref <5.7)
Mean Plasma Glucose: 171 (calc)
eAG (mmol/L): 9.5 (calc)

## 2020-05-03 ENCOUNTER — Ambulatory Visit: Payer: Medicare PPO | Admitting: Pharmacist

## 2020-05-03 ENCOUNTER — Other Ambulatory Visit: Payer: Self-pay

## 2020-05-03 DIAGNOSIS — E782 Mixed hyperlipidemia: Secondary | ICD-10-CM

## 2020-05-03 DIAGNOSIS — Z794 Long term (current) use of insulin: Secondary | ICD-10-CM
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
## 2020-05-03 NOTE — Chronic Care Management (AMB) (Signed)
  Chronic Care Management   Note   Name: Edgar [name] MRN: [id number] DOB: 12/16/1945    Care Coordination Only: Pending feedback regarding available vouchers and monthly/quarterly benefits available through Mr. [place] insurance provider. Per our last discussion, he prefers to utilize any available resources provided by [employer] Will confirm available benefits and request additional referrals as needed.    Cristy [facility] Care Management [facility] [phone]

## 2020-05-05 ENCOUNTER — Encounter: Payer: Self-pay | Admitting: Family Medicine

## 2020-05-05 NOTE — Patient Instructions (Addendum)
Visit Information  Goals Addressed            This Visit's Progress   . Chronic Care Management       CARE PLAN ENTRY (see longitudinal plan of care for additional care plan information)  Current Barriers:  . Chronic Disease Management support, education, and care coordination needs related to Hypertension, Hyperlipidemia, and Diabetes   Hypertension BP Readings from Last 3 Encounters:  04/29/20 126/78  01/02/20 133/78  12/17/19 118/76   . Pharmacist Clinical Goal(s): o Over the next 90 days, patient will work with PharmD and providers to maintain BP goal <130/80 . Current regimen:  o Amlodipine 10mg  daily o Losartan 100mg  daily . Interventions: o None . Patient self care activities - Over the next 90 days, patient will: o Check BP weekly, document, and provide at future appointments o Ensure daily salt intake < 2300 mg/day  Hyperlipidemia Lab Results  Component Value Date/Time   LDLCALC 69 10/15/2019 10:58 AM   . Pharmacist Clinical Goal(s): o Over the next 90 days, patient will work with PharmD and providers to maintain LDL goal < 70 . Current regimen:  o Lipitor 40mg  daily . Interventions: o None . Patient self care activities - Over the next 90 days, patient will: o Continue to take atorvastatin 40mg  every day  Diabetes Lab Results  Component Value Date/Time   HGBA1C 7.6 (H) 04/29/2020 09:18 AM   HGBA1C 7.3 (H) 10/15/2019 10:58 AM   . Pharmacist Clinical Goal(s): o Over the next 90 days, patient will work with PharmD and providers to achieve A1c goal <7% . Current regimen:  o Basaglar inject 60 units at bedtime o Humalog inject 10 units three times daily with meals . Interventions: o None . Patient self care activities - Over the next 90 days, patient will: o Check blood sugar once daily, document, and provide at future appointments o Contact provider with any episodes of hypoglycemia  Medication management . Pharmacist Clinical Goal(s): o Over the  next 90 days, patient will work with PharmD and providers to maintain optimal medication adherence . Current pharmacy: [facility] . Interventions o Comprehensive medication review performed. o Continue current medication management strategy o Planning to renew [employer] in early 2022 . Patient self care activities - Over the next 90 days, patient will: o Focus on medication adherence by providing proof of income for next application o Take medications as prescribed o Report any questions or concerns to PharmD and/or provider(s)  Initial goal documentation        Edgar [name] was given information about Chronic Care Management services today including:  1. CCM service includes personalized support from designated clinical staff supervised by his physician, including individualized plan of care and coordination with other care providers 2. 24/7 contact phone numbers for assistance for urgent and routine care needs. 3. Standard insurance, coinsurance, copays and deductibles apply for chronic care management only during months in which we provide at least 20 minutes of these services. Most insurances cover these services at 100%, however patients may be responsible for any copay, coinsurance and/or deductible if applicable. This service may help you avoid the need for more expensive face-to-face services. 4. Only one practitioner may furnish and bill the service in a calendar month. 5. The patient may stop CCM services at any time (effective at the end of the month) by phone call to the office staff.  Patient agreed to services and verbal consent obtained.   Print copy of patient instructions  provided.  Telephone follow up appointment with pharmacy team member scheduled for: 4 months  Milus Height, PharmD, [place], [facility] [phone]   Diabetic Nephropathy  Diabetic nephropathy is kidney disease that is caused by diabetes (diabetes mellitus). Kidneys  are organs that filter and clean blood and get rid of body waste products and extra fluid. Diabetes can cause gradual kidney damage over many years. Diabetic nephropathy that continues to get worse can lead to kidney failure. What are the causes? This condition is caused by kidney damage from diabetes that is not well controlled with treatment. Having high blood sugar (glucose) for a long time because of diabetes can damage blood vessels in the kidneys and cause them to thicken and become scarred. Those changes prevent the kidneys from functioning normally. What increases the risk? This condition is more likely to develop in people with diabetes who:  Have had diabetes for many years.  Have high blood pressure.  Have high blood glucose levels over a long period of time.  Have a family history of kidney disease.  Have a history of tobacco use.  Have certain genes that are passed from parent to child (inherited). What are the signs or symptoms? This condition may not cause symptoms at first. If you do have symptoms, they may include:  Swelling of your hands, feet, or ankles.  Weakness.  Poor appetite.  Nausea.  Confusion.  Tiredness (fatigue).  Trouble sleeping.  Dry, itchy skin. If nephropathy leads to kidney failure, symptoms may include:  Vomiting.  Shortness of breath.  Jerky movements that you cannot control (seizure).  Coma. How is this diagnosed? It is important to diagnose this condition before symptoms develop. You may be screened for diabetic nephropathy at a routine health care visit. Screening tests may include:  Urine tests. These may be done every year.  Urine collection over a 24-hour period to measure kidney function.  Blood tests to measure blood glucose levels and kidney function.  Regular blood pressure monitoring. If your health care provider suspects diabetic nephropathy, he or she may:  Review your medical history and symptoms.  Do a  physical exam.  Do an ultrasound of your kidneys.  Perform a procedure to take a sample of kidney tissue for testing (biopsy). How is this treated? The goal of treatment is to prevent or slow down any damage to your kidneys by managing your diabetes. To do this, it is important to control:  Your blood pressure. ? Your target blood pressure may vary depending on your medical conditions, your age, and other factors. ? To help control blood pressure, you may be prescribed medicines to lower your blood pressure (ACE inhibitors) or to help your body get rid of excess fluid (diuretics).  Your A1c (hemoglobin A1c) level. Generally, the goal of treatment is to maintain an A1c level of less than 7%.  Your blood glucose level.  Your blood lipids. If you have high cholesterol, you may need to take lipid-lowering drugs, such as statins. Other treatments may include:  Medicines, including insulin injections.  Lifestyle changes, such as losing weight, quitting smoking, or making changes to your diet. If your disease progresses to end-stage kidney failure, treatment may include:  Dialysis. This is a procedure to filter your blood with a machine.  Kidney transplant. Follow these instructions at home: Eating and drinking  Eat healthy foods, and eat healthy snacks between meals. Follow instructions from your health care provider about eating and drinking restrictions.  Limit your sodium (salt), protein, or fluid intake as directed.  If you drink alcohol: ? Limit how much you use to:  0-1 drink a day for nonpregnant women.  0-2 drinks a day for men. ? Be aware of how much alcohol is in your drink. In the [place] one drink equals one 12 oz bottle of beer (355 mL), one 5 oz glass of wine (148 mL), or one 1 oz glass of hard liquor (44 mL). Lifestyle  Maintain a healthy weight. Work with your health care provider to lose weight, if needed.  Do not use any products that contain nicotine or  tobacco, such as cigarettes, e-cigarettes, and chewing tobacco. If you need help quitting, ask your health care provider.  Be physically active every day. Ask your health care provider what type of exercise is best for you.  Work with your health care provider to manage your blood pressure. General instructions      Follow your diabetes management plan as directed. ? Check your blood glucose levels as directed by your health care provider. ? Keep your blood glucose in your target range as directed by your health care provider. ? Have your A1c level checked two or more times a year, or as often as told by your health care provider.  Measure your blood pressure regularly at home, as told by your health care provider.  Take over-the-counter and prescription medicines only as told by your health care provider. These include insulin and supplements.  Keep all follow-up visits and routine visits as told by your health care provider. This is important. Make sure you get screening tests as directed. Where to find more information American Diabetes Association: www.diabetes.org Contact a health care provider if:  You have trouble keeping your blood glucose in your goal range.  Your blood glucose level is higher than 240 mg/dL (13.3 mmol/L) for 2 days in a row.  You have swelling in your hands, ankles, or feet.  You feel weak, tired, or dizzy.  You have sudden muscle tightening (spasms).  You have nausea or vomiting.  You feel tired all the time. Get help right away if:  You are very sleepy.  You faint.  You have: ? A seizure. ? Severe, painful muscle spasms. ? Shortness of breath. ? Chest pain. Summary  Diabetic nephropathy is kidney disease that is caused by diabetes (diabetes mellitus).  Keep your blood sugar (glucose) in your target range as directed by your health care provider.  Work with your health care provider to manage your blood pressure.  Keep all follow-up  visits and routine visits as told by your health care provider. This is important. Make sure you get screening tests as directed. This information is not intended to replace advice given to you by your health care provider. Make sure you discuss any questions you have with your health care provider. Document Revised: 01/05/2019 Document Reviewed: 01/05/2019 Elsevier Patient Education  [place].

## 2020-05-25 ENCOUNTER — Ambulatory Visit (INDEPENDENT_AMBULATORY_CARE_PROVIDER_SITE_OTHER): Payer: Medicare PPO

## 2020-05-25 DIAGNOSIS — E1121 Type 2 diabetes mellitus with diabetic nephropathy: Secondary | ICD-10-CM | POA: Diagnosis not present

## 2020-05-25 DIAGNOSIS — Z794 Long term (current) use of insulin: Secondary | ICD-10-CM | POA: Diagnosis not present

## 2020-05-25 NOTE — Chronic Care Management (AMB) (Signed)
Chronic Care Management   Follow Up Note   05/25/2020 Name: ELCHONON MAXSON MRN: [id number] DOB: [birth year]  Primary Care Provider: Delsa Grana, PA-C Reason for referral : Chronic Care Management   MATTHEW CINA is a 74 y.o. year old male who is a primary care patient of Delsa Grana, [place]. Mr. Macpherson is currently engaged with the chronic care management team. A routine telephonic outreach was conducted today.  Review of Mr. Dishman status, including review of consultants reports, relevant labs and test results was conducted today. Collaboration with appropriate care team members was performed as part of the comprehensive evaluation and provision of chronic care management services.    SDOH (Social Determinants of Health) assessments performed: No     Outpatient Encounter Medications as of 05/25/2020  Medication Sig Note  . acetaminophen (TYLENOL) 500 MG tablet Take 500 mg by mouth every 6 (six) hours as needed. 12/07/2016: PRN  . amLODipine (NORVASC) 10 MG tablet Take 1 tablet (10 mg total) by mouth daily.   Marland Kitchen atorvastatin (LIPITOR) 40 MG tablet TAKE 1 TABLET BY MOUTH EVERYDAY AT BEDTIME   . Blood Glucose Monitoring Suppl (ACCU-CHEK AVIVA PLUS) w/Device KIT 1 Device by Does not apply route 2 (two) times daily. Dx E11.21 Check BG BID   . Blood Glucose Monitoring Suppl (ONE TOUCH ULTRA 2) w/Device KIT Check fingerstick blood sugars three times a day; DX E11.65, LON 99 months   . cholecalciferol (VITAMIN D) 1000 units tablet Take 1,000 Units by mouth 2 (two) times daily.    . empagliflozin (JARDIANCE) 25 MG TABS tablet TAKE 1 TABLET BY MOUTH DAILY. (HIGHER DOSE)   . fluticasone (FLONASE) 50 MCG/ACT nasal spray Place 2 sprays into both nostrils daily. (Patient not taking: Reported on 05/03/2020)   . glucose blood (ACCU-CHEK AVIVA PLUS) test strip Use as instructed D:E11.21 Check BG bid   . glucose blood (ONE TOUCH ULTRA TEST) test strip USE AS INSTRUCTEDDX E11.65, LON 99 MONTHS CHECK FINGERSTICK  BLOOD SUGARS THREE TIMES A DAY   . HUMALOG KWIKPEN 100 UNIT/ML KwikPen INJECT 10 UNITS TOTAL INTO THE SKIN 3 (THREE) TIMES DAILY.   Marland Kitchen Insulin Glargine (BASAGLAR KWIKPEN) 100 UNIT/ML Inject 60 Units into the skin at bedtime. [employer]   . insulin glargine (LANTUS SOLOSTAR) 100 UNIT/ML Solostar Pen INJECT 60 UNITS INTO THE SKIN DAILY. OR AS DIRECTED BY YOUR DOCTOR (Patient not taking: Reported on 05/05/2020)   . Insulin Pen Needle (B-D UF III MINI PEN NEEDLES) 31G X 5 MM MISC USE ONCE A DAY WITH LONG-ACTING INSULIN, AND UP TO 3X A DAY WITH SHORT-ACTING INSULIN   . Lancets (ACCU-CHEK MULTICLIX) lancets Use as instructed Dx Ell.21, Check BG bid   . Lancets (ONETOUCH ULTRASOFT) lancets Check fingerstick blood sugar three times a day; E11.65, LON 99 months   . levocetirizine (XYZAL) 5 MG tablet TAKE 1 TABLET BY MOUTH EVERY DAY IN THE EVENING (Patient not taking: Reported on 05/03/2020)   . losartan (COZAAR) 100 MG tablet TAKE 1 TABLET BY MOUTH EVERY DAY    No facility-administered encounter medications on file as of 05/25/2020.      Goals Addressed            This Visit's Progress   . Chronic Care Management       CARE PLAN ENTRY (see longitudinal plan of care for additional care plan information)  Current Barriers:  . Chronic Disease Management support and education needs related Hypertension, Coronary Artery Disease, Hyperlipidemia and Diabetes.  Case  Manager Clinical Goal(s):  Marland Kitchen Over the next 90 days, patient will take all medications as prescribed. . Over the next 90 days, patient will attend all medical appointments as scheduled.-Complete . Over the next 90 days, patient will monitor his blood pressure and record readings. . Over the next 90 days, patient will monitor his blood glucose daily and maintain a log. . Over the next 90 days, patient will work with the care management team to address concerns regarding financial restraints and need for additional services in the  home.  Interventions:  . Inter-disciplinary care team collaboration (see longitudinal plan of care) . Reviewed medications. During the previous outreach, Mr. Rastetter expressed concerns regarding insulin cost. A referral was placed for outreach with the CCM Pharmacist. Reports his daughter is currently working with the pharmacy team to discuss options. He also plans to discuss options to resume receiving prescriptions via [facility] mail order.   . Reviewed blood glucose levels. Reports an elevated fasting reading of 184 mg/dl today. He admits that this is likely due to his nutritional intake. We discussed need for additional nutritional assistance. Declines need for additional assistance at this time. Reports that his daughter is currently helping offset some of their expenses. He agreed to update the team if additional assistance is needed.   . Discussed plan for care management follow-up. Reports doing well today. Denies urgent needs but expressed concerns regarding possible assistance with utilities during the winter months. He anticipates a significant increase in power and gas expenses d/t the his spouse's home medical equipment. He will discuss this with his daughter and follow-up with the team within the next month.   Patient Self Care Activities:  . Self administers medications  . Attends scheduled provider appointments . Performs ADL's independently . Performs IADL's independently   Please see past updates related to this goal by clicking on the "Past Updates" button in the selected goal           PLAN A member of the chronic care management team will follow-up with Mr. Welz next month.    Cristy Friedlander [facility] Care Management [facility] [phone]

## 2020-05-26 ENCOUNTER — Ambulatory Visit (INDEPENDENT_AMBULATORY_CARE_PROVIDER_SITE_OTHER): Payer: Medicare PPO | Admitting: Podiatry

## 2020-05-26 ENCOUNTER — Other Ambulatory Visit: Payer: Self-pay

## 2020-05-26 ENCOUNTER — Encounter: Payer: Self-pay | Admitting: Podiatry

## 2020-05-26 DIAGNOSIS — B351 Tinea unguium: Secondary | ICD-10-CM | POA: Diagnosis not present

## 2020-05-26 DIAGNOSIS — M79676 Pain in unspecified toe(s): Secondary | ICD-10-CM | POA: Diagnosis not present

## 2020-05-26 DIAGNOSIS — E0843 Diabetes mellitus due to underlying condition with diabetic autonomic (poly)neuropathy: Secondary | ICD-10-CM | POA: Diagnosis not present

## 2020-05-26 NOTE — Progress Notes (Signed)
This patient returns to my office for at risk foot care.  This patient requires this care by a professional since this patient will be at risk due to having  Diabetes with diabetic nephropathy.  This patient is unable to cut nails himself since the patient cannot reach his nails.These nails are painful walking and wearing shoes.  This patient presents for at risk foot care today.  General Appearance  Alert, conversant and in no acute stress.  Vascular  Dorsalis pedis and posterior tibial  pulses are palpable  bilaterally.  Capillary return is within normal limits  bilaterally. Temperature is within normal limits  bilaterally.  Neurologic  Senn-Weinstein monofilament wire test within normal limits  bilaterally. Muscle power within normal limits bilaterally.  Nails Thick disfigured discolored nails with subungual debris  from hallux to fifth toes bilaterally. No evidence of bacterial infection or drainage bilaterally.  Orthopedic  No limitations of motion  feet .  No crepitus or effusions noted.  No bony pathology or digital deformities noted.  Skin  normotropic skin with no porokeratosis noted bilaterally.  No signs of infections or ulcers noted.     Onychomycosis  Pain in right toes  Pain in left toes  Consent was obtained for treatment procedures.   Mechanical debridement of nails 1-5  bilaterally performed with a nail nipper.  Filed with dremel without incident.    Return office visit   3 months                   Told patient to return for periodic foot care and evaluation due to potential at risk complications.   [name] DPM

## 2020-05-27 ENCOUNTER — Other Ambulatory Visit: Payer: Self-pay | Admitting: Family Medicine

## 2020-05-27 DIAGNOSIS — Z794 Long term (current) use of insulin: Secondary | ICD-10-CM

## 2020-05-27 DIAGNOSIS — E1121 Type 2 diabetes mellitus with diabetic nephropathy: Secondary | ICD-10-CM

## 2020-06-13 NOTE — Patient Instructions (Addendum)
Thank you for allowing the Chronic Care Management team to participate in your care.   Goals Addressed            This Visit's Progress   . Chronic Care Management       CARE PLAN ENTRY (see longitudinal plan of care for additional care plan information)  Current Barriers:  . Chronic Disease Management support and education needs related Hypertension, Coronary Artery Disease, Hyperlipidemia and Diabetes.  Case Manager Clinical Goal(s):  Edgar Kitchen Over the next 90 days, patient will take all medications as prescribed. . Over the next 90 days, patient will attend all medical appointments as scheduled.-Complete . Over the next 90 days, patient will monitor his blood pressure and record readings. . Over the next 90 days, patient will monitor his blood glucose daily and maintain a log. . Over the next 90 days, patient will work with the care management team to address concerns regarding financial restraints and need for additional services in the home.  Interventions:  . Inter-disciplinary care team collaboration (see longitudinal plan of care) . Reviewed medications. During the previous outreach, Edgar [name] expressed concerns regarding insulin cost. A referral was placed for outreach with the CCM Pharmacist. Reports his daughter is currently working with the pharmacy team to discuss options. He also plans to discuss options to resume receiving prescriptions via [facility] mail order.   . Reviewed blood glucose levels. Reports an elevated fasting reading of 184 mg/dl today. He admits that this is likely due to his nutritional intake. We discussed need for additional nutritional assistance. Declines need for additional assistance at this time. Reports that his daughter is currently helping offset some of their expenses. He agreed to update the team if additional assistance is needed.   . Discussed plan for care management follow-up. Reports doing well today. Denies urgent needs but expressed concerns  regarding possible assistance with utilities during the winter months. He anticipates a significant increase in power and gas expenses d/t the his spouse's home medical equipment. He will discuss this with his daughter and follow-up with the team within the next month.   Patient Self Care Activities:  . Self administers medications  . Attends scheduled provider appointments . Performs ADL's independently . Performs IADL's independently   Please see past updates related to this goal by clicking on the "Past Updates" button in the selected goal        Edgar [name] verbalized understanding of the information discussed during the telephonic outreach today. Declined need for a mailed/printed copy of the information.    A member of the chronic care management team will follow-up with Edgar [name] next month.    Cristy Friedlander [facility] Care Management [facility] [place] [phone]

## 2020-06-23 ENCOUNTER — Encounter: Payer: Self-pay | Admitting: Family Medicine

## 2020-06-23 ENCOUNTER — Other Ambulatory Visit: Payer: Self-pay | Admitting: Family Medicine

## 2020-06-23 DIAGNOSIS — E1121 Type 2 diabetes mellitus with diabetic nephropathy: Secondary | ICD-10-CM

## 2020-06-23 DIAGNOSIS — Z794 Long term (current) use of insulin: Secondary | ICD-10-CM

## 2020-06-23 MED ORDER — BASAGLAR KWIKPEN 100 UNIT/ML ~~LOC~~ SOPN: 60.0000 [IU] | PEN_INJECTOR | Freq: Every day | SUBCUTANEOUS | 0 refills | Status: DC

## 2020-06-23 NOTE — Progress Notes (Signed)
I received forms from pt today that were dropped off in clinic yesterday. They are asking for completion ASAP Are lilly patient assistance forms that are blank  Pt is IDDM, near goal, and satisfactory to me with his age Lab Results  Component Value Date   HGBA1C 7.6 (H) 04/29/2020   At last OV in September he expressed concerns with financial constraints related to his insulin, CCM pharmacy was consulted, and over the past two months pt had encounters with CCM nurse specialists and pharmacist  Paperwork given to new embedded pharmacist in clinic here today Cristie Hem I will reorder meds/print whatever is needed to help the pt get the patient assistance. I have signed forms  Pt DM has been managed on lantus 60 units once daily - for lilly cares will need to switch to basaglar qwikpen -  Also on humalog 10 units TID     ICD-10-CM   1. Type 2 diabetes mellitus with diabetic nephropathy, 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HCC)  E11.21 Insulin Glargine (BASAGLAR KWIKPEN) 100 UNIT/ML   Z79.4    Signed printed Rx and application given to CCM pharmacist Delsa Grana, PA-C

## 2020-06-23 NOTE — Progress Notes (Signed)
I received forms from pt today that were dropped off in clinic yesterday. They are asking for completion ASAP - see other order encounter with complete documentation and orders lilly patient assistance forms that are blank

## 2020-06-27 ENCOUNTER — Telehealth: Payer: Self-pay

## 2020-06-27 ENCOUNTER — Ambulatory Visit: Payer: Medicare PPO

## 2020-06-27 DIAGNOSIS — E1121 Type 2 diabetes mellitus with diabetic nephropathy: Secondary | ICD-10-CM

## 2020-06-27 DIAGNOSIS — Z599 Problem related to housing and economic circumstances, unspecified: Secondary | ICD-10-CM

## 2020-06-27 DIAGNOSIS — Z794 Long term (current) use of insulin: Secondary | ICD-10-CM

## 2020-06-27 NOTE — Chronic Care Management (AMB) (Signed)
Chronic Care Management   Follow Up Note   06/27/2020 Name: EYOEL THROGMORTON MRN: [id number] DOB: 1946/06/24  Primary Care Provider: Delsa Grana, PA-C Reason for referral : Chronic Care Management   AMEYA KUTZ is a 74 y.o. year old male who is a primary care patient of Delsa Grana, [place]. He is currently engaged with the chronic care management team. A brief telephonic outreach was conducted today.  Review of Mr. Schroader status, including review of consultants reports, relevant labs and test results was conducted today. Collaboration with appropriate care team members was performed as part of the comprehensive evaluation and provision of chronic care management services.    SDOH (Social Determinants of Health) assessments performed: Yes SDOH Interventions     Most Recent Value  SDOH Interventions  Housing Interventions Other (Comment)  [Referral submitted for assistance with utilities.]         Outpatient Encounter Medications as of 06/27/2020  Medication Sig Note  . acetaminophen (TYLENOL) 500 MG tablet Take 500 mg by mouth every 6 (six) hours as needed. 12/07/2016: PRN  . amLODipine (NORVASC) 10 MG tablet Take 1 tablet (10 mg total) by mouth daily.   Marland Kitchen atorvastatin (LIPITOR) 40 MG tablet TAKE 1 TABLET BY MOUTH EVERYDAY AT BEDTIME   . B-D UF III MINI PEN NEEDLES 31G X 5 MM MISC USE ONCE A DAY WITH LONG-ACTING INSULIN, AND UP TO 3X A DAY WITH SHORT-ACTING INSULIN   . Blood Glucose Monitoring Suppl (ACCU-CHEK AVIVA PLUS) w/Device KIT 1 Device by Does not apply route 2 (two) times daily. Dx E11.21 Check BG BID   . Blood Glucose Monitoring Suppl (ONE TOUCH ULTRA 2) w/Device KIT Check fingerstick blood sugars three times a day; DX E11.65, LON 99 months   . cholecalciferol (VITAMIN D) 1000 units tablet Take 1,000 Units by mouth 2 (two) times daily.    . empagliflozin (JARDIANCE) 25 MG TABS tablet TAKE 1 TABLET BY MOUTH DAILY. (HIGHER DOSE)   . fluticasone (FLONASE) 50 MCG/ACT nasal spray  Place 2 sprays into both nostrils daily.   Marland Kitchen glucose blood (ACCU-CHEK AVIVA PLUS) test strip Use as instructed D:E11.21 Check BG bid   . glucose blood (ONE TOUCH ULTRA TEST) test strip USE AS INSTRUCTEDDX E11.65, LON 99 MONTHS CHECK FINGERSTICK BLOOD SUGARS THREE TIMES A DAY   . HUMALOG KWIKPEN 100 UNIT/ML KwikPen INJECT 10 UNITS TOTAL INTO THE SKIN 3 (THREE) TIMES DAILY.   Marland Kitchen Insulin Glargine (BASAGLAR KWIKPEN) 100 UNIT/ML Inject 60 Units into the skin at bedtime. [employer]   . insulin glargine (LANTUS SOLOSTAR) 100 UNIT/ML Solostar Pen INJECT 60 UNITS INTO THE SKIN DAILY. OR AS DIRECTED BY YOUR DOCTOR   . Lancets (ACCU-CHEK MULTICLIX) lancets Use as instructed Dx Ell.21, Check BG bid   . Lancets (ONETOUCH ULTRASOFT) lancets Check fingerstick blood sugar three times a day; E11.65, LON 99 months   . levocetirizine (XYZAL) 5 MG tablet TAKE 1 TABLET BY MOUTH EVERY DAY IN THE EVENING   . losartan (COZAAR) 100 MG tablet TAKE 1 TABLET BY MOUTH EVERY DAY    No facility-administered encounter medications on file as of 06/27/2020.       Objective:  Lab Results  Component Value Date   HGBA1C 7.6 (H) 04/29/2020     Goals Addressed            This Visit's Progress   . Assistance with utilities.      . Chronic Care Management       CARE  PLAN ENTRY (see longitudinal plan of care for additional care plan information)  Current Barriers:  . Chronic Disease Management support and education needs related Hypertension, Coronary Artery Disease, Hyperlipidemia and Diabetes.  Case Manager Clinical Goal(s):  Marland Kitchen Over the next 90 days, patient will take all medications as prescribed. . Over the next 90 days, patient will attend all medical appointments as scheduled.-Complete . Over the next 90 days, patient will monitor his blood pressure and record readings. . Over the next 90 days, patient will monitor his blood glucose daily and maintain a log. . Over the next 90 days, patient will work with the  care management team to address concerns regarding financial restraints and need for additional services in the home.  Interventions:  . Inter-disciplinary care team collaboration (see longitudinal plan of care)  . Reviewed blood glucose levels and compliance with medications. Reports receiving the [facility] application for assistance with his prescribed insulin.  His daughter will assist with completing and returning the needed paperwork to the [facility] Pharmacist. Reports monitoring FBS as recommended. Fasting reading today was 153 mg/dl which he attributes to his intake. We will continue to discuss diabetic food options.   . Discussed plan for care management follow-up. During the previous outreach he discussed concerns regarding increasing utility costs during the winter months. A referral was submitted today for assistance. Denies other urgent concerns but will update the team if his care management needs change.    Patient Self Care Activities:  . Self administers medications  . Attends scheduled provider appointments . Performs ADL's independently . Performs IADL's independently   Please see past updates related to this goal by clicking on the "Past Updates" button in the selected goal           PLAN A member of the care management team will follow-up with Mr. Pettry within the next month.    [facility] Care Management [facility] [phone]

## 2020-06-27 NOTE — Progress Notes (Signed)
  Chronic Care Management Pharmacy Assistant   Name: Edgar [name]  MRN: [id number] DOB: 12/06/1945  Reason for Encounter: Medication Review  Patient Questions:  1.  Have you seen any other providers since your last visit? Yes, Edgar [name] (podiatry and Edgar [name] (PCP)  2.  Any changes in your medicines or health? Yes, Basaglar qwikpen switched due to Patient assistance   Edgar [name],  74 y.o. , male presents for their Follow-Up CCM visit with the clinical pharmacist via telephone.  PCP : [name], Leisa, PA-C  Allergies:  No Known Allergies  Medications: Outpatient Encounter Medications as of 06/27/2020  Medication Sig Note  . acetaminophen (TYLENOL) 500 MG tablet Take 500 mg by mouth every 6 (six) hours as needed. 12/07/2016: PRN  . amLODipine (NORVASC) 10 MG tablet Take 1 tablet (10 mg total) by mouth daily.   . atorvastatin (LIPITOR) 40 MG tablet TAKE 1 TABLET BY MOUTH EVERYDAY AT BEDTIME   . B-D UF III MINI PEN NEEDLES 31G X 5 MM MISC USE ONCE A DAY WITH LONG-ACTING INSULIN, AND UP TO 3X A DAY WITH SHORT-ACTING INSULIN   . Blood Glucose Monitoring Suppl (ACCU-CHEK AVIVA PLUS) w/Device KIT 1 Device by Does not apply route 2 (two) times daily. Dx E11.21 Check BG BID   . Blood Glucose Monitoring Suppl (ONE TOUCH ULTRA 2) w/Device KIT Check fingerstick blood sugars three times a day; DX E11.65, LON 99 months   . cholecalciferol (VITAMIN D) 1000 units tablet Take 1,000 Units by mouth 2 (two) times daily.    . empagliflozin (JARDIANCE) 25 MG TABS tablet TAKE 1 TABLET BY MOUTH DAILY. (HIGHER DOSE)   . fluticasone (FLONASE) 50 MCG/ACT nasal spray Place 2 sprays into both nostrils daily.   . glucose blood (ACCU-CHEK AVIVA PLUS) test strip Use as instructed D:E11.21 Check BG bid   . glucose blood (ONE TOUCH ULTRA TEST) test strip USE AS INSTRUCTEDDX E11.65, LON 99 MONTHS CHECK FINGERSTICK BLOOD SUGARS THREE TIMES A DAY   . HUMALOG KWIKPEN 100 UNIT/ML KwikPen INJECT 10 UNITS TOTAL INTO  THE SKIN 3 (THREE) TIMES DAILY.   . Insulin Glargine (BASAGLAR KWIKPEN) 100 UNIT/ML Inject 60 Units into the skin at bedtime. [employer]   . insulin glargine (LANTUS SOLOSTAR) 100 UNIT/ML Solostar Pen INJECT 60 UNITS INTO THE SKIN DAILY. OR AS DIRECTED BY YOUR DOCTOR   . Lancets (ACCU-CHEK MULTICLIX) lancets Use as instructed Dx Ell.21, Check BG bid   . Lancets (ONETOUCH ULTRASOFT) lancets Check fingerstick blood sugar three times a day; E11.65, LON 99 months   . levocetirizine (XYZAL) 5 MG tablet TAKE 1 TABLET BY MOUTH EVERY DAY IN THE EVENING   . losartan (COZAAR) 100 MG tablet TAKE 1 TABLET BY MOUTH EVERY DAY    No facility-administered encounter medications on file as of 06/27/2020.    Current Diagnosis: Patient Active Problem List   Diagnosis Date Noted  . Coronary artery disease 10/25/2017  . Aneurysm of thoracic aorta (HCC) 09/24/2017  . 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  . History of colonoscopy with polypectomy 12/07/2016  . Fatty liver 09/11/2016  . Aneurysm of aorta (HCC) 09/11/2016  . 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 Tinea pedis 06/25/2016  . Hx of pulmonary embolus 05/18/2016  . Essential hypertension, benign 12/23/2015  . Hyperlipidemia 12/23/2015  . Morbid obesity (HCC) 12/23/2015  . Type 2 diabetes mellitus with diabetic nephropathy, 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HCC) 01/25/2015    Goals Addressed     None     Follow-Up:  Patient Assistance Coordination   LVM need to know about Jardiance and Insulin for PAP according to notes these may be a problem.   06/29/2020 LVM for patient to follow up on Jardiance, Basaglar, and Lantus Solostar patient assistance needs.  06/29/2020- [facility] contacted. 1st call resulted in pt application has not been processed and they could not give me a date. Current application has refills left However a new prescription is required.Request for refill on current application sent to [name] to  collaberate with Delsa Grana, PA-C.   Within an hour a fax was received stating the application was missing information and provider needed to contact customer service. Ref#[id number]. (excessive hold time) 31 min spent. [facility] only received Basaglar prescription. They are questioning if he also needs Humalog.  Also need his insurance card faxed in.   07/04/2020. Spoke with patient. Pt is in need on Basaglar, Lantus Solostar, and Jardiance. He has not been taking Humalog. Need for Humalog to be further researched. 07/05/2020 Filled out Humalog paperwork. Sent to [employer] for signatures from provider.

## 2020-06-28 ENCOUNTER — Telehealth: Payer: Self-pay

## 2020-06-28 NOTE — Telephone Encounter (Signed)
    MA11/23/2021 1st Attempt  Name: WRIGLEY PLASENCIA   MRN: [id number]   DOB: [birth year]   AGE: 74 y.o.   GENDER: male   PCP Delsa Grana, PA-C.   06/28/20 Spoke with patient's daughter [name]. Emailed letter and application per her request to [email or web address].  Clemetine Marker is also mailing a copy of the letter and application as well. Will follow-up with [name] in the next 7 days.   Nicholus Chandran, AAS Paralegal, [place] . Embedded Care Coordination [facility]  Care Management  [address], [place] [address] [email or web address]  [phone]   [email or web address]

## 2020-07-05 ENCOUNTER — Telehealth: Payer: Self-pay

## 2020-07-05 NOTE — Telephone Encounter (Signed)
    MA11/30/2021   Name: Edgar [name]   MRN: [id number]   DOB: 1946/07/27   AGE: 74 y.o.   GENDER: male   PCP Delsa Grana, PA-C.   07/05/20 Received email confirmation that patient's daughter received [employer] Application she will complete for her father and mail. No additional resources are needed at this time. Closing referral.    Nolyn Eilert, AAS Paralegal, [place] . Embedded Care Coordination [facility]  Care Management  [address], [place] [address] [email or web address]  [phone]   [email or web address]

## 2020-07-06 ENCOUNTER — Telehealth: Payer: Self-pay

## 2020-07-06 NOTE — Progress Notes (Signed)
    Chronic Care Management Pharmacy Assistant   Name: Edgar [name]  MRN: [id number] DOB: June 07, 1946  Reason for Encounter: Medication Management  Patient Questions:  1.  Have you seen any other providers since your last visit? Yes, Edgar [name] and Edgar [name]  2.  Any changes in your medicines or health? No   Edgar [name],  74 y.o. , male  PCP : Edgar Grana, PA-C  Allergies:  No Known Allergies  Medications: Outpatient Encounter Medications as of 07/06/2020  Medication Sig Note  . acetaminophen (TYLENOL) 500 MG tablet Take 500 mg by mouth every 6 (six) hours as needed. 12/07/2016: PRN  . amLODipine (NORVASC) 10 MG tablet Take 1 tablet (10 mg total) by mouth daily.   Marland Kitchen atorvastatin (LIPITOR) 40 MG tablet TAKE 1 TABLET BY MOUTH EVERYDAY AT BEDTIME   . B-D UF III MINI PEN NEEDLES 31G X 5 MM MISC USE ONCE A DAY WITH LONG-ACTING INSULIN, AND UP TO 3X A DAY WITH SHORT-ACTING INSULIN   . Blood Glucose Monitoring Suppl (ACCU-CHEK AVIVA PLUS) w/Device KIT 1 Device by Does not apply route 2 (two) times daily. Dx E11.21 Check BG BID   . Blood Glucose Monitoring Suppl (ONE TOUCH ULTRA 2) w/Device KIT Check fingerstick blood sugars three times a day; DX E11.65, LON 99 months   . cholecalciferol (VITAMIN D) 1000 units tablet Take 1,000 Units by mouth 2 (two) times daily.    . empagliflozin (JARDIANCE) 25 MG TABS tablet TAKE 1 TABLET BY MOUTH DAILY. (HIGHER DOSE)   . fluticasone (FLONASE) 50 MCG/ACT nasal spray Place 2 sprays into both nostrils daily.   Marland Kitchen glucose blood (ACCU-CHEK AVIVA PLUS) test strip Use as instructed D:E11.21 Check BG bid   . glucose blood (ONE TOUCH ULTRA TEST) test strip USE AS INSTRUCTEDDX E11.65, LON 99 MONTHS CHECK FINGERSTICK BLOOD SUGARS THREE TIMES A DAY   . HUMALOG KWIKPEN 100 UNIT/ML KwikPen INJECT 10 UNITS TOTAL INTO THE SKIN 3 (THREE) TIMES DAILY.   Marland Kitchen Insulin Glargine (BASAGLAR KWIKPEN) 100 UNIT/ML Inject 60 Units into the skin at bedtime. [employer]   . insulin  glargine (LANTUS SOLOSTAR) 100 UNIT/ML Solostar Pen INJECT 60 UNITS INTO THE SKIN DAILY. OR AS DIRECTED BY YOUR DOCTOR   . Lancets (ACCU-CHEK MULTICLIX) lancets Use as instructed Dx Ell.21, Check BG bid   . Lancets (ONETOUCH ULTRASOFT) lancets Check fingerstick blood sugar three times a day; E11.65, LON 99 months   . levocetirizine (XYZAL) 5 MG tablet TAKE 1 TABLET BY MOUTH EVERY DAY IN THE EVENING   . losartan (COZAAR) 100 MG tablet TAKE 1 TABLET BY MOUTH EVERY DAY    No facility-administered encounter medications on file as of 07/06/2020.    Current Diagnosis: Patient Assistance Coordination   Follow-Up:  Pharmacy Review Mailed application for Lantus Solo Star 100 unit/mL  patient assistance to patient. Patient to complete and return it and any needed supporting documents to office so that Edgar Grana, PA-C can provide prescription for application. Patient may contact me if any questions, phone number provided.

## 2020-07-11 NOTE — Patient Instructions (Signed)
Thank you for allowing the Chronic Care Management team to participate in your care.   Goals Addressed            This Visit's Progress   . Assistance with utilities.      . Chronic Care Management       CARE PLAN ENTRY (see longitudinal plan of care for additional care plan information)  Current Barriers:  . Chronic Disease Management support and education needs related Hypertension, Coronary Artery Disease, Hyperlipidemia and Diabetes.  Case Manager Clinical Goal(s):  Marland Kitchen Over the next 90 days, patient will take all medications as prescribed. . Over the next 90 days, patient will attend all medical appointments as scheduled.-Complete . Over the next 90 days, patient will monitor his blood pressure and record readings. . Over the next 90 days, patient will monitor his blood glucose daily and maintain a log. . Over the next 90 days, patient will work with the care management team to address concerns regarding financial restraints and need for additional services in the home.  Interventions:  . Inter-disciplinary care team collaboration (see longitudinal plan of care)  . Reviewed blood glucose levels and compliance with medications. Reports receiving the [facility] application for assistance with his prescribed insulin.  His daughter will assist with completing and returning the needed paperwork to the [facility] Pharmacist. Reports monitoring FBS as recommended. Fasting reading today was 153 mg/dl which he attributes to his intake. We will continue to discuss diabetic food options.   . Discussed plan for care management follow-up. During the previous outreach he discussed concerns regarding increasing utility costs during the winter months. A referral was submitted today for assistance. Denies other urgent concerns but will update the team if his care management needs change.    Patient Self Care Activities:  . Self administers medications  . Attends scheduled provider  appointments . Performs ADL's independently . Performs IADL's independently   Please see past updates related to this goal by clicking on the "Past Updates" button in the selected goal         Edgar [name] verbalized understanding of the information discussed during the telephonic outreach today. Declined need for a mailed/printed copy of the information.   A member of the care management team will follow-up with Edgar [name] within the next month.   [facility] Care Management [facility] [phone]

## 2020-08-01 ENCOUNTER — Ambulatory Visit (INDEPENDENT_AMBULATORY_CARE_PROVIDER_SITE_OTHER): Payer: Medicare PPO | Admitting: Family Medicine

## 2020-08-01 ENCOUNTER — Other Ambulatory Visit: Payer: Self-pay

## 2020-08-01 ENCOUNTER — Encounter: Payer: Self-pay | Admitting: Family Medicine

## 2020-08-01 VITALS — BP 136/82 | HR 91 | Temp 98.9°F | Resp 18 | Ht 72.0 in | Wt 275.3 lb

## 2020-08-01 DIAGNOSIS — I2510 Atherosclerotic heart disease of native coronary artery without angina pectoris: Secondary | ICD-10-CM

## 2020-08-01 DIAGNOSIS — E1121 Type 2 diabetes mellitus with diabetic nephropathy: Secondary | ICD-10-CM | POA: Diagnosis not present

## 2020-08-01 DIAGNOSIS — Z5181 Encounter for therapeutic drug level monitoring: Secondary | ICD-10-CM

## 2020-08-01 DIAGNOSIS — I712 Thoracic aortic aneurysm, without rupture, unspecified: Secondary | ICD-10-CM

## 2020-08-01 DIAGNOSIS — J310 Chronic rhinitis: Secondary | ICD-10-CM

## 2020-08-01 DIAGNOSIS — Z794 Long term (current) use of insulin: Secondary | ICD-10-CM

## 2020-08-01 DIAGNOSIS — I10 Essential (primary) hypertension: Secondary | ICD-10-CM | POA: Diagnosis not present

## 2020-08-01 DIAGNOSIS — E782 Mixed hyperlipidemia: Secondary | ICD-10-CM

## 2020-08-01 DIAGNOSIS — I700 Atherosclerosis of aorta: Secondary | ICD-10-CM

## 2020-08-01 MED ORDER — GLUCOSE BLOOD VI STRP: ORAL_STRIP | 11 refills | Status: DC

## 2020-08-01 MED ORDER — ONETOUCH ULTRASOFT LANCETS MISC: 11 refills | Status: DC

## 2020-08-01 NOTE — Progress Notes (Signed)
Name: Edgar [name]   MRN: [id number]    DOB: 08-30-1945   Date:08/01/2020       Progress Note  Chief Complaint  Patient presents with  . Diabetes  . Hyperlipidemia  . Hypertension    3 month follow up     Subjective:   Edgar [name] is a 74 y.o. male, presents to clinic for routine f/up  He has been working with our CCM pharmacist to get meds for DM  DM:  Not at goal, but fairly controlled for age with insulin - basal and mealtime, jardiance  Reports good med compliance Pt has no SE from meds. Blood sugars up and down, tried to keep under 200 - doesn't have meter Denies: Polyuria, polydipsia, vision changes, neuropathy, hypoglycemia Recent pertinent labs: Lab Results  Component Value Date   HGBA1C 7.6 (H) 04/29/2020   HGBA1C 7.3 (H) 10/15/2019   HGBA1C 7.3 (H) 04/14/2019   Standard of care and health maintenance: Foot exam:  Done - due march DM eye exam:  Done - due march ACEI/ARB: yes Statin:  yes  Hypertension:  Currently managed on amlodipine and losartan Pt reports good med compliance and denies any SE.   Blood pressure today is fairly well controlled. BP Readings from Last 3 Encounters:  08/01/20 136/82  04/29/20 126/78  01/02/20 133/78   Pt denies CP, SOB, exertional sx, LE edema, palpitation, Ha's, visual disturbances, lightheadedness, hypotension, syncope.  PAD - following with vascular, denies any Leg sx/changes, no swelling, wounds, paresthesias, new pain  CAD - denies any exertional sx or CP  HLD - on atorvastatin 40 mg, lipids well controlled, due in March  Vit D deficiency - on supplement  AR- on xyzal and flonase  Obesity: weight stable - down a few lbs Wt Readings from Last 5 Encounters:  08/01/20 275 lb 4.8 oz (124.9 kg)  04/29/20 280 lb 11.2 oz (127.3 kg)  01/02/20 240 lb (108.9 kg)  12/17/19 280 lb 9.3 oz (127.3 kg)  11/17/19 280 lb (127 kg)   BMI Readings from Last 5 Encounters:  08/01/20 37.34 kg/m  04/29/20 38.07 kg/m   01/02/20 32.55 kg/m  12/17/19 38.05 kg/m  11/17/19 37.97 kg/m        Current Outpatient Medications:  .  acetaminophen (TYLENOL) 500 MG tablet, Take 500 mg by mouth every 6 (six) hours as needed., Disp: , Rfl:  .  amLODipine (NORVASC) 10 MG tablet, Take 1 tablet (10 mg total) by mouth daily., Disp: 90 tablet, Rfl: 3 .  atorvastatin (LIPITOR) 40 MG tablet, TAKE 1 TABLET BY MOUTH EVERYDAY AT BEDTIME, Disp: 90 tablet, Rfl: 3 .  cholecalciferol (VITAMIN D) 1000 units tablet, Take 1,000 Units by mouth 2 (two) times daily. , Disp: , Rfl:  .  empagliflozin (JARDIANCE) 25 MG TABS tablet, TAKE 1 TABLET BY MOUTH DAILY. (HIGHER DOSE), Disp: 90 tablet, Rfl: 3 .  fluticasone (FLONASE) 50 MCG/ACT nasal spray, Place 2 sprays into both nostrils daily., Disp: 16 g, Rfl: 6 .  HUMALOG KWIKPEN 100 UNIT/ML KwikPen, INJECT 10 UNITS TOTAL INTO THE SKIN 3 (THREE) TIMES DAILY., Disp: 15 mL, Rfl: 6 .  Insulin Glargine (BASAGLAR KWIKPEN) 100 UNIT/ML, Inject 60 Units into the skin at bedtime. Lilly Cares, Disp: 15 mL, Rfl: 0 .  insulin glargine (LANTUS SOLOSTAR) 100 UNIT/ML Solostar Pen, INJECT 60 UNITS INTO THE SKIN DAILY. OR AS DIRECTED BY YOUR DOCTOR, Disp: 15 mL, Rfl: 2 .  levocetirizine (XYZAL) 5 MG tablet, TAKE 1 TABLET BY  MOUTH EVERY DAY IN THE EVENING, Disp: 90 tablet, Rfl: 3 .  losartan (COZAAR) 100 MG tablet, TAKE 1 TABLET BY MOUTH EVERY DAY, Disp: 90 tablet, Rfl: 3 .  B-D UF III MINI PEN NEEDLES 31G X 5 MM MISC, USE ONCE A DAY WITH LONG-ACTING INSULIN, AND UP TO 3X A DAY WITH SHORT-ACTING INSULIN (Patient not taking: Reported on 08/01/2020), Disp: 300 each, Rfl: 3 .  Blood Glucose Monitoring Suppl (ACCU-CHEK AVIVA PLUS) w/Device KIT, 1 Device by Does not apply route 2 (two) times daily. Dx E11.21 Check BG BID (Patient not taking: Reported on 08/01/2020), Disp: 1 kit, Rfl: 0 .  Blood Glucose Monitoring Suppl (ONE TOUCH ULTRA 2) w/Device KIT, Check fingerstick blood sugars three times a day; DX E11.65, LON  99 months (Patient not taking: Reported on 08/01/2020), Disp: 1 each, Rfl: 0 .  glucose blood (ACCU-CHEK AVIVA PLUS) test strip, Use as instructed D:E11.21 Check BG bid (Patient not taking: Reported on 08/01/2020), Disp: 100 each, Rfl: 12 .  glucose blood (ONE TOUCH ULTRA TEST) test strip, USE AS INSTRUCTEDDX E11.65, LON 99 MONTHS CHECK FINGERSTICK BLOOD SUGARS THREE TIMES A DAY (Patient not taking: Reported on 08/01/2020), Disp: 300 each, Rfl: 11 .  Lancets (ACCU-CHEK MULTICLIX) lancets, Use as instructed Dx Ell.21, Check BG bid (Patient not taking: Reported on 08/01/2020), Disp: 100 each, Rfl: 12 .  Lancets (ONETOUCH ULTRASOFT) lancets, Check fingerstick blood sugar three times a day; E11.65, LON 99 months (Patient not taking: Reported on 08/01/2020), Disp: 100 each, Rfl: 12  Patient Active Problem List   Diagnosis Date Noted  . Coronary artery disease 10/25/2017  . 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[place]) 05/14/2017  . History of colonoscopy with polypectomy 12/07/2016  . Fatty liver 09/11/2016  . Aneurysm of aorta ([place]) 09/11/2016  . 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 Tinea pedis 06/25/2016  . Hx of pulmonary embolus 05/18/2016  . Essential hypertension, benign 12/23/2015  . Hyperlipidemia 12/23/2015  . Morbid obesity ([place]) 12/23/2015  . Type 2 diabetes mellitus with diabetic nephropathy, 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[place]) 01/25/2015    Past Surgical History:  Procedure Laterality Date  . COLONOSCOPY WITH PROPOFOL N/A 01/15/2017   Procedure: COLONOSCOPY WITH PROPOFOL;  Surgeon: Jonathon Bellows, MD;  Location: [facility] ENDOSCOPY;  Service: Endoscopy;  Laterality: N/A;  . COLONOSCOPY WITH PROPOFOL N/A 12/17/2019   Procedure: COLONOSCOPY WITH PROPOFOL;  Surgeon: Jonathon Bellows, MD;  Location: [facility] ENDOSCOPY;  Service: Gastroenterology;  Laterality: N/A;  . EYE SURGERY    . FRACTURE SURGERY    . INTRAMEDULLARY (IM) NAIL  INTERTROCHANTERIC Left 04/14/2016   Procedure: INTRAMEDULLARY (IM) NAIL INTERTROCHANTRIC;  Surgeon: Corky Mull, MD;  Location: [facility] ORS;  Service: Orthopedics;  Laterality: Left;  . JOINT REPLACEMENT Left 04/2016  . TOTAL HIP ARTHROPLASTY      Family History  Problem Relation Age of Onset  . Cancer Mother   . Diabetes Mother   . Heart disease Father   . Alcohol abuse Brother     Social History   Tobacco Use  . Smoking status: Former Smoker    Types: Cigarettes    Quit date: 09/01/1978    Years since quitting: 41.9  . Smokeless tobacco: Never Used  Vaping Use  . Vaping Use: Never used  Substance Use Topics  . Alcohol use: No    Alcohol/week: 0.0 standard drinks  . Drug use: No     No Known Allergies  Health  Maintenance  Topic Date Due  . COVID-19 Vaccine (3 - Booster for Moderna series) 04/17/2020  . TETANUS/TDAP  08/13/2020 (Originally 05/14/2019)  . FOOT EXAM  10/14/2020  . OPHTHALMOLOGY EXAM  10/21/2020  . HEMOGLOBIN A1C  10/27/2020  . COLONOSCOPY (Pts 45-42yr Insurance coverage will need to be confirmed)  12/17/2024  . INFLUENZA VACCINE  Completed  . Hepatitis C Screening  Completed  . PNA vac Low Risk Adult  Completed    Chart Review Today: I personally reviewed active problem list, medication list, allergies, family history, social history, health maintenance, notes from last encounter, lab results, imaging with the patient/caregiver today.   Review of Systems  10 Systems reviewed and are negative for acute change except as noted in the HPI.  Objective:   Vitals:   08/01/20 0921  BP: 136/82  Pulse: 91  Resp: 18  Temp: 98.9 F (37.2 C)  TempSrc: Oral  SpO2: 95%  Weight: 275 lb 4.8 oz (124.9 kg)  Height: 6' (1.829 m)    Body mass index is 37.34 kg/m.  Physical Exam Vitals and nursing note reviewed.  Constitutional:      General: He is not in acute distress.    Appearance: Normal appearance. He is well-developed. He is obese. He is not  ill-appearing, toxic-appearing or diaphoretic.     Interventions: Face mask in place.  HENT:     Head: Normocephalic and atraumatic.     Jaw: No trismus.     Right Ear: External ear normal.     Left Ear: External ear normal.  Eyes:     General: Lids are normal. No scleral icterus.       Right eye: No discharge.        Left eye: No discharge.     Conjunctiva/sclera: Conjunctivae normal.  Neck:     Trachea: Trachea and phonation normal. No tracheal deviation.  Cardiovascular:     Rate and Rhythm: Normal rate and regular rhythm.     Pulses: Normal pulses.          Radial pulses are 2+ on the right side and 2+ on the left side.       Posterior tibial pulses are 2+ on the right side and 2+ on the left side.     Heart sounds: Normal heart sounds. No murmur heard. No friction rub. No gallop.   Pulmonary:     Effort: Pulmonary effort is normal. No respiratory distress.     Breath sounds: Normal breath sounds. No stridor. No wheezing, rhonchi or rales.  Abdominal:     General: Bowel sounds are normal. There is no distension.     Palpations: Abdomen is soft.  Musculoskeletal:     Right lower leg: No edema.     Left lower leg: No edema.  Skin:    General: Skin is warm and dry.     Coloration: Skin is not jaundiced or pale.     Findings: No rash.     Nails: There is no clubbing.  Neurological:     Mental Status: He is alert. Mental status is at baseline.     Cranial Nerves: No dysarthria or facial asymmetry.     Motor: No tremor or abnormal muscle tone.     Gait: Gait normal.  Psychiatric:        Mood and Affect: Mood normal.        Speech: Speech normal.        Behavior: Behavior normal. Behavior is cooperative.  Assessment & Plan:   1. Type 2 diabetes mellitus with diabetic nephropathy, 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HCC) A1C has been fairly well controlled considering age  Continue working with [facility] for insulin costs Continue basal insulin 60  units humalog 10 units TID - adjust as needed for blood sugars Encouraged checking sugar more often - bring meter to next appt - COMPLETE METABOLIC PANEL WITH GFR - Hemoglobin A1c - glucose blood (ONE TOUCH ULTRA TEST) test strip; CHECK FINGERSTICK BLOOD SUGARS THREE TIMES A DAY, LON 99 MONTHS, for ICD-10 E11.65, E11.21, Z79.4  Dispense: 300 each; Refill: 11 - Lancets (ONETOUCH ULTRASOFT) lancets; Check fingerstick blood sugar three times a day; For IDDM, uncontrolled, ICD-10 E11.65, E11.21, Z79.4, LON 99 months  Dispense: 100 each; Refill: 11  2. Mixed hyperlipidemia Compliant with statin, no SE or concerns, lipids due March - continue monitoring - COMPLETE METABOLIC PANEL WITH GFR  3. Essential hypertension, benign Stable, well controlled, BP at goal for age today Continue losartan and norvasc, healthy diet/lifestyle, low salt - COMPLETE METABOLIC PANEL WITH GFR  4. Encounter for medication monitoring - COMPLETE METABOLIC PANEL WITH GFR - Hemoglobin A1c  5. Coronary artery disease involving native coronary artery of native heart without angina pectoris No CP, BP fairly well controlled, DM well controlled for age, on ACEI, statin and ASA, no new sx or concerns  6. Morbid obesity (HCC) Weight stable, BMI 37, multiple comorbidities IDDM, HTN, HLD, PAD, CAD  7. Aortic atherosclerosis (HCC) On statin, monitoring  8. Thoracic aortic aneurysm without rupture ([place]) On statin, monitoring, compliant with vascular f/up  9. Rhinitis, unspecified type Continue xyzal and flonase    Return in about 3 months (around 10/30/2020) for IDDM, HLD, HTN - foot exam, bring glucometer .   Delsa Grana, PA-C 08/01/20 9:24 AM

## 2020-08-01 NOTE — Patient Instructions (Signed)
Please bring in your meter with you to your next appointment  Sign at the front desk for [place] to be able to discuss your private health info with your daughter if you wish for [place] to - PHI (sign consent)   Type 2 Diabetes Mellitus, Self Care, Adult When you have type 2 diabetes (type 2 diabetes mellitus), you must make sure your blood sugar (glucose) stays in a healthy range. You can do this with:  Nutrition.  Exercise.  Lifestyle changes.  Medicines or insulin, if needed.  Support from your doctors and others. How to stay aware of blood sugar   Check your blood sugar level every day, as often as told.  Have your A1c (hemoglobin A1c) level checked two or more times a year. Have it checked more often if your doctor tells you to. Your doctor will set personal treatment goals for you. Generally, you should have these blood sugar levels:  Before meals (preprandial): 80-130 mg/dL (4.4-7.2 mmol/L).  After meals (postprandial): below 180 mg/dL (10 mmol/L).  A1c level: less than 7%. How to manage high and low blood sugar Signs of high blood sugar High blood sugar is called hyperglycemia. Know the signs of high blood sugar. Signs may include:  Feeling: ? Thirsty. ? Hungry. ? Very tired.  Needing to pee (urinate) more than usual.  Blurry vision. Signs of low blood sugar Low blood sugar is called hypoglycemia. This is when blood sugar is at or below 70 mg/dL (3.9 mmol/L). Signs may include:  Feeling: ? Hungry. ? Worried or nervous (anxious). ? Sweaty and clammy. ? Confused. ? Dizzy. ? Sleepy. ? Sick to your stomach (nauseous).  Having: ? A fast heartbeat. ? A headache. ? A change in your vision. ? Jerky movements that you cannot control (seizure). ? Tingling or no feeling (numbness) around your mouth, lips, or tongue.  Having trouble with: ? Moving (coordination). ? Sleeping. ? Passing out (fainting). ? Getting upset easily (irritability). Treating low blood  sugar To treat low blood sugar, eat or drink something sugary right away. If you can think clearly and swallow safely, follow the 15:15 rule:  Take 15 grams of a fast-acting carb (carbohydrate). Talk with your doctor about how much you should take.  Some fast-acting carbs are: ? Sugar tablets (glucose pills). Take 3-4 pills. ? 6-8 pieces of hard candy. ? 4-6 oz (120-150 mL) of fruit juice. ? 4-6 oz (120-150 mL) of regular (not diet) soda. ? 1 Tbsp (15 mL) honey or sugar.  Check your blood sugar 15 minutes after you take the carb.  If your blood sugar is still at or below 70 mg/dL (3.9 mmol/L), take 15 grams of a carb again.  If your blood sugar does not go above 70 mg/dL (3.9 mmol/L) after 3 tries, get help right away.  After your blood sugar goes back to normal, eat a meal or a snack within 1 hour. Treating very low blood sugar If your blood sugar is at or below 54 mg/dL (3 mmol/L), you have very low blood sugar (severe hypoglycemia). This is an emergency. Do not wait to see if the symptoms will go away. Get medical help right away. Call your local emergency services (911 in the [place] If you have very low blood sugar and you cannot eat or drink, you may need a glucagon shot (injection). A family member or friend should learn how to check your blood sugar and how to give you a glucagon shot. Ask your doctor if  you need to have a glucagon shot kit at home. Follow these instructions at home: Medicine  Take insulin and diabetes medicines as told.  If your doctor says you should take more or less insulin and medicines, do this exactly as told.  Do not run out of insulin or medicines. Having diabetes can raise your risk for other long-term conditions. These include heart disease and kidney disease. Your doctor may prescribe medicines to help you not have these problems. Food   Make healthy food choices. These include: ? Chicken, fish, egg whites, and beans. ? Oats, whole wheat,  bulgur, brown rice, quinoa, and millet. ? Fresh fruits and vegetables. ? Low-fat dairy products. ? Nuts, avocado, olive oil, and canola oil.  Meet with a food specialist (dietitian). He or she can help you make an eating plan that is right for you.  Follow instructions from your doctor about what you cannot eat or drink.  Drink enough fluid to keep your pee (urine) pale yellow.  Keep track of carbs that you eat. Do this by reading food labels and learning food serving sizes.  Follow your sick day plan when you cannot eat or drink normally. Make this plan with your doctor so it is ready to use. Activity  Exercise 3 or more times a week.  Do not go more than 2 days without exercising.  Talk with your doctor before you start a new exercise. Your doctor may need to tell you to change: ? How much insulin or medicines you take. ? How much food you eat. Lifestyle  Do not use any tobacco products. These include cigarettes, chewing tobacco, and e-cigarettes. If you need help quitting, ask your doctor.  Ask your doctor how much alcohol is safe for you.  Learn to deal with stress. If you need help with this, ask your doctor. Body care   Stay up to date with your shots (immunizations).  Have your eyes and feet checked by a doctor as often as told.  Check your skin and feet every day. Check for cuts, bruises, redness, blisters, or sores.  Brush your teeth and gums two times a day. Floss one or more times a day.  Go to the dentist one or more times every 6 months.  Stay at a healthy weight. General instructions  Take over-the-counter and prescription medicines only as told by your doctor.  Share your diabetes care plan with: ? Your work or school. ? People you live with.  Carry a card or wear jewelry that says you have diabetes.  Keep all follow-up visits as told by your doctor. This is important. Questions to ask your doctor  Do I need to meet with a diabetes  educator?  Where can I find a support group for people with diabetes? Where to find more information To learn more about diabetes, visit:  American Diabetes Association: www.diabetes.org  American Association of Diabetes Educators: www.diabeteseducator.org Summary  When you have type 2 diabetes, you must make sure your blood sugar (glucose) stays in a healthy range.  Check your blood sugar every day, as often as told.  Having diabetes can raise your risk for other conditions. Your doctor may prescribe medicines to help you not have these problems.  Keep all follow-up visits as told by your doctor. This is important. This information is not intended to replace advice given to you by your health care provider. Make sure you discuss any questions you have with your health care provider. Document Revised: 01/13/2018  Document Reviewed: 08/26/2015 Elsevier Patient Education  [employer].

## 2020-08-02 LAB — COMPLETE METABOLIC PANEL WITH GFR
AG Ratio: 1.1 (calc) (ref 1.0–2.5)
ALT: 15 U/L (ref 9–46)
AST: 14 U/L (ref 10–35)
Albumin: 3.9 g/dL (ref 3.6–5.1)
Alkaline phosphatase (APISO): 69 U/L (ref 35–144)
BUN: 14 mg/dL (ref 7–25)
CO2: 24 mmol/L (ref 20–32)
Calcium: 10 mg/dL (ref 8.6–10.3)
Chloride: 105 mmol/L (ref 98–110)
Creat: 1.06 mg/dL (ref 0.70–1.18)
GFR, Est African American: 80 mL/min/{1.73_m2} (ref >=60)
GFR, Est Non African American: 69 mL/min/{1.73_m2} (ref >=60)
Globulin: 3.5 g/dL (calc) (ref 1.9–3.7)
Glucose, Bld: 133 mg/dL — ABNORMAL HIGH (ref 65–99)
Potassium: 4.2 mmol/L (ref 3.5–5.3)
Sodium: 138 mmol/L (ref 135–146)
Total Bilirubin: 0.6 mg/dL (ref 0.2–1.2)
Total Protein: 7.4 g/dL (ref 6.1–8.1)

## 2020-08-02 LAB — HEMOGLOBIN A1C
Hgb A1c MFr Bld: 7.4 % of total Hgb — ABNORMAL HIGH (ref <5.7)
Mean Plasma Glucose: 166 mg/dL
eAG (mmol/L): 9.2 mmol/L

## 2020-08-03 ENCOUNTER — Telehealth: Payer: Self-pay

## 2020-08-03 NOTE — Progress Notes (Signed)
    Chronic Care Management Pharmacy Assistant   Name: Edgar [name]  MRN: [id number] DOB: 02-17-1946  Reason for Encounter: Medication Review  PCP : Delsa Grana, PA-C  Allergies:  No Known Allergies  Medications: Outpatient Encounter Medications as of 08/03/2020  Medication Sig Note  . acetaminophen (TYLENOL) 500 MG tablet Take 500 mg by mouth every 6 (six) hours as needed. 12/07/2016: PRN  . amLODipine (NORVASC) 10 MG tablet Take 1 tablet (10 mg total) by mouth daily.   Marland Kitchen atorvastatin (LIPITOR) 40 MG tablet TAKE 1 TABLET BY MOUTH EVERYDAY AT BEDTIME   . B-D UF III MINI PEN NEEDLES 31G X 5 MM MISC USE ONCE A DAY WITH LONG-ACTING INSULIN, AND UP TO 3X A DAY WITH SHORT-ACTING INSULIN (Patient not taking: Reported on 08/01/2020)   . Blood Glucose Monitoring Suppl (ONE TOUCH ULTRA 2) w/Device KIT Check fingerstick blood sugars three times a day; DX E11.65, LON 99 months (Patient not taking: Reported on 08/01/2020)   . cholecalciferol (VITAMIN D) 1000 units tablet Take 1,000 Units by mouth 2 (two) times daily.    . empagliflozin (JARDIANCE) 25 MG TABS tablet TAKE 1 TABLET BY MOUTH DAILY. (HIGHER DOSE)   . fluticasone (FLONASE) 50 MCG/ACT nasal spray Place 2 sprays into both nostrils daily.   Marland Kitchen glucose blood (ONE TOUCH ULTRA TEST) test strip CHECK FINGERSTICK BLOOD SUGARS THREE TIMES A DAY, LON 99 MONTHS, for ICD-10 E11.65, E11.21, Z79.4   . HUMALOG KWIKPEN 100 UNIT/ML KwikPen INJECT 10 UNITS TOTAL INTO THE SKIN 3 (THREE) TIMES DAILY.   Marland Kitchen Insulin Glargine (BASAGLAR KWIKPEN) 100 UNIT/ML Inject 60 Units into the skin at bedtime. [employer]   . Lancets (ONETOUCH ULTRASOFT) lancets Check fingerstick blood sugar three times a day; For IDDM, uncontrolled, ICD-10 E11.65, E11.21, Z79.4, LON 99 months   . levocetirizine (XYZAL) 5 MG tablet TAKE 1 TABLET BY MOUTH EVERY DAY IN THE EVENING   . losartan (COZAAR) 100 MG tablet TAKE 1 TABLET BY MOUTH EVERY DAY    No facility-administered encounter  medications on file as of 08/03/2020.    Current Diagnosis: Patient Active Problem List   Diagnosis Date Noted  . Rhinitis 08/01/2020  . Aortic atherosclerosis ([place]) 08/01/2020  . Coronary artery disease 10/25/2017  . 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[place]) 05/14/2017  . History of colonoscopy with polypectomy 12/07/2016  . Fatty liver 09/11/2016  . Aneurysm of aorta ([place]) 09/11/2016  . 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 Tinea pedis 06/25/2016  . Hx of pulmonary embolus 05/18/2016  . Essential hypertension, benign 12/23/2015  . Hyperlipidemia 12/23/2015  . Morbid obesity ([place]) 12/23/2015  . Type 2 diabetes mellitus with diabetic nephropathy, 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[place]) 01/25/2015    Goals Addressed   None    Performed cost analysis for patient, estimated yearly medication cost of $75.64.  Follow-Up:  Medication Cost Review   Bessie Saltillo Pharmacist Assistant [phone]

## 2020-08-04 ENCOUNTER — Telehealth: Payer: Self-pay

## 2020-08-04 NOTE — Progress Notes (Signed)
° °  Chronic Care Management Pharmacy Assistant   Name: Edgar [name]  MRN: [id number] DOB: 1946-04-09  Reason for Encounter: Medication Review   Edgar [name],  74 y.o. , male .  PCP : Edgar Grana, PA-C  Allergies:   No Known Allergies  Medications: Outpatient Encounter Medications as of 08/04/2020  Medication Sig Note   acetaminophen (TYLENOL) 500 MG tablet Take 500 mg by mouth every 6 (six) hours as needed. 12/07/2016: PRN   amLODipine (NORVASC) 10 MG tablet Take 1 tablet (10 mg total) by mouth daily.    atorvastatin (LIPITOR) 40 MG tablet TAKE 1 TABLET BY MOUTH EVERYDAY AT BEDTIME    B-D UF III MINI PEN NEEDLES 31G X 5 MM MISC USE ONCE A DAY WITH LONG-ACTING INSULIN, AND UP TO 3X A DAY WITH SHORT-ACTING INSULIN (Patient not taking: Reported on 08/01/2020)    Blood Glucose Monitoring Suppl (ONE TOUCH ULTRA 2) w/Device KIT Check fingerstick blood sugars three times a day; DX E11.65, LON 99 months (Patient not taking: Reported on 08/01/2020)    cholecalciferol (VITAMIN D) 1000 units tablet Take 1,000 Units by mouth 2 (two) times daily.     empagliflozin (JARDIANCE) 25 MG TABS tablet TAKE 1 TABLET BY MOUTH DAILY. (HIGHER DOSE)    fluticasone (FLONASE) 50 MCG/ACT nasal spray Place 2 sprays into both nostrils daily.    glucose blood (ONE TOUCH ULTRA TEST) test strip CHECK FINGERSTICK BLOOD SUGARS THREE TIMES A DAY, LON 99 MONTHS, for ICD-10 E11.65, E11.21, Z79.4    HUMALOG KWIKPEN 100 UNIT/ML KwikPen INJECT 10 UNITS TOTAL INTO THE SKIN 3 (THREE) TIMES DAILY.    Insulin Glargine (BASAGLAR KWIKPEN) 100 UNIT/ML Inject 60 Units into the skin at bedtime. Lilly Cares    Lancets [facility] ULTRASOFT) lancets Check fingerstick blood sugar three times a day; For IDDM, uncontrolled, ICD-10 E11.65, E11.21, Z79.4, LON 99 months    levocetirizine (XYZAL) 5 MG tablet TAKE 1 TABLET BY MOUTH EVERY DAY IN THE EVENING    losartan (COZAAR) 100 MG tablet TAKE 1 TABLET BY MOUTH EVERY DAY    No  facility-administered encounter medications on file as of 08/04/2020.    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 Per insurance data, [facility] does not have adherence data on file.  [name] PharmD, notified.  Follow-Up:  Pharmacist Review   [facility] CMA, [place]

## 2020-08-17 ENCOUNTER — Telehealth: Payer: Self-pay

## 2020-08-17 NOTE — Chronic Care Management (AMB) (Signed)
08/17/2020  Called patient to remind her of an appointment with Daron Offer ,CPP on 08/18/2020 @ 01:00 PM.  Left message on machine with date and time of appointment.  Daron Offer CPP Notified  Judithann Sheen, [facility] Clinical Pharmacist Assistant [phone]

## 2020-08-18 ENCOUNTER — Ambulatory Visit: Payer: Self-pay

## 2020-08-18 DIAGNOSIS — I10 Essential (primary) hypertension: Secondary | ICD-10-CM

## 2020-08-18 DIAGNOSIS — Z794 Long term (current) use of insulin: Secondary | ICD-10-CM

## 2020-08-18 NOTE — Chronic Care Management (AMB) (Signed)
Chronic Care Management Pharmacy  Name: Edgar [name]  MRN: [id number] DOB: 1945-09-11  Chief Complaint/ HPI  Edgar [name],  75 y.o. , male presents for their Follow-Up CCM visit with the clinical pharmacist via telephone.  PCP : Delsa Grana, PA-C  Their chronic conditions include: Hypertension, Hyperlipidemia, Diabetes, Coronary Artery Disease, Allergic Rhinitis, and Fatty Liver   Office Visits:  08/01/20: Patient presented to Delsa Grana, PA-C for follow-up. A1c 7.4%. Patient given blood sugar meter and instructed to check.   Consult Visit: NA  Medications: Outpatient Encounter Medications as of 08/18/2020  Medication Sig Note  . acetaminophen (TYLENOL) 500 MG tablet Take 500 mg by mouth every 6 (six) hours as needed. 12/07/2016: PRN  . amLODipine (NORVASC) 10 MG tablet Take 1 tablet (10 mg total) by mouth daily.   Marland Kitchen atorvastatin (LIPITOR) 40 MG tablet TAKE 1 TABLET BY MOUTH EVERYDAY AT BEDTIME   . B-D UF III MINI PEN NEEDLES 31G X 5 MM MISC USE ONCE A DAY WITH LONG-ACTING INSULIN, AND UP TO 3X A DAY WITH SHORT-ACTING INSULIN (Patient not taking: Reported on 08/01/2020)   . Blood Glucose Monitoring Suppl (ONE TOUCH ULTRA 2) w/Device KIT Check fingerstick blood sugars three times a day; DX E11.65, LON 99 months (Patient not taking: Reported on 08/01/2020)   . cholecalciferol (VITAMIN D) 1000 units tablet Take 1,000 Units by mouth 2 (two) times daily.    . empagliflozin (JARDIANCE) 25 MG TABS tablet TAKE 1 TABLET BY MOUTH DAILY. (HIGHER DOSE)   . fluticasone (FLONASE) 50 MCG/ACT nasal spray Place 2 sprays into both nostrils daily.   Marland Kitchen glucose blood (ONE TOUCH ULTRA TEST) test strip CHECK FINGERSTICK BLOOD SUGARS THREE TIMES A DAY, LON 99 MONTHS, for ICD-10 E11.65, E11.21, Z79.4   . HUMALOG KWIKPEN 100 UNIT/ML KwikPen INJECT 10 UNITS TOTAL INTO THE SKIN 3 (THREE) TIMES DAILY.   Marland Kitchen Insulin Glargine (BASAGLAR KWIKPEN) 100 UNIT/ML Inject 60 Units into the skin at bedtime. [employer]   .  Lancets (ONETOUCH ULTRASOFT) lancets Check fingerstick blood sugar three times a day; For IDDM, uncontrolled, ICD-10 E11.65, E11.21, Z79.4, LON 99 months   . levocetirizine (XYZAL) 5 MG tablet TAKE 1 TABLET BY MOUTH EVERY DAY IN THE EVENING   . losartan (COZAAR) 100 MG tablet TAKE 1 TABLET BY MOUTH EVERY DAY    No facility-administered encounter medications on file as of 08/18/2020.   Financial Resource Strain: High Risk  . Difficulty of Paying Living Expenses: Hard     Current Diagnosis/Assessment:  Goals Addressed   None    Diabetes   Recent Relevant Labs: Lab Results  Component Value Date/Time   HGBA1C 7.4 (H) 08/01/2020 09:46 AM   HGBA1C 7.6 (H) 04/29/2020 09:18 AM   MICROALBUR 20.5 10/15/2019 10:58 AM   MICROALBUR 15.2 09/01/2018 09:49 AM     Checking BG: 2x per Day  Patient has failed these meds in past: Lantus (cost) Patient is currently uncontrolled on the following medications:   . Jardiance 25 mg daily  . Humalog 10 units TID  . Basaglar 60 units daily   Last diabetic Foot exam:  Lab Results  Component Value Date/Time   HMDIABEYEEXA No Retinopathy 10/22/2019 12:00 AM    Last diabetic Eye exam: No results found for: HMDIABFOOTEX   We discussed: Patient with affordability concerns for insulin and Jardiance. Patient did not have meter available to review recent readings   Patient is interested in getting off insulin if he can.  Plan  Continue current medications  Will restart PAP for 2022.  Will plan for CPP follow-up in one month to review blood sugars and discuss possibly titration off bolus insulin if appropriate.   Hypertension   BP goal is:  <130/80  Office blood pressures are  BP Readings from Last 3 Encounters:  08/01/20 136/82  04/29/20 126/78  01/02/20 133/78   Patient checks BP at home infrequently Patient home BP readings are ranging: NA  Patient has failed these meds in the past: NA Patient is currently controlled on the  following medications:  . Losartan 160m daily . Amlodipine 136mdaily  We discussed: At goal Denies hypotension  Plan  Continue current medications   Hyperlipidemia   LDL goal < 70  Lipid Panel     Component Value Date/Time   CHOL 119 10/15/2019 1058   CHOL 129 12/23/2015 0959   TRIG 62 10/15/2019 1058   HDL 36 (L) 10/15/2019 1058   HDL 35 (L) 12/23/2015 0959   LDLCALC 69 10/15/2019 1058    Hepatic Function Latest Ref Rng & Units 08/01/2020 04/29/2020 10/15/2019  Total Protein 6.1 - 8.1 g/dL 7.4 7.3 7.4  Albumin 3.6 - 5.1 g/dL - - -  AST 10 - 35 U/L _0 ALT 9 - 46 U/L _1 Alk Phosphatase 40 - 115 U/L - - -  Total Bilirubin 0.2 - 1.2 mg/dL 0.6 0.6 0.7     The ASCVD Risk score (GMikey BussingC Jr., et al., 2013) failed to calculate for the following reasons:   The valid total cholesterol range is 130 to 320 mg/dL   Patient has failed these meds in past: NA Patient is currently controlled on the following medications:  . Atorvastatin 4067maily  We discussed:  At goal Denies myalgias  Plan  Continue current medications  Medication Management   Pt uses [facility] for all medications Uses pill box? Yes Pt endorses 100% compliance  We discussed:   Plan  Continue current medication management strategy  Follow up: 1 month phone visit  [phone]

## 2020-08-23 ENCOUNTER — Telehealth: Payer: Self-pay

## 2020-08-23 NOTE — Patient Instructions (Signed)
Visit Information It was great speaking with you today!  Please let me know if you have any questions about our visit. Goals Addressed            This Visit's Progress   . Chronic Care Management       CARE PLAN ENTRY (see longitudinal plan of care for additional care plan information)  Current Barriers:  . Chronic Disease Management support, education, and care coordination needs related to Hypertension, Hyperlipidemia, Diabetes, Coronary Artery Disease, Allergic Rhinitis, and Fatty Liver   Hypertension BP Readings from Last 3 Encounters:  04/29/20 126/78  01/02/20 133/78  12/17/19 118/76   . Pharmacist Clinical Goal(s): o Over the next 90 days, patient will work with PharmD and providers to maintain BP goal <130/80 . Current regimen:  o Amlodipine 10mg  daily o Losartan 100mg  daily . Interventions: o None . Patient self care activities - Over the next 90 days, patient will: o Check BP weekly, document, and provide at future appointments o Ensure daily salt intake < 2300 mg/day  Hyperlipidemia Lab Results  Component Value Date/Time   LDLCALC 69 10/15/2019 10:58 AM   . Pharmacist Clinical Goal(s): o Over the next 90 days, patient will work with PharmD and providers to maintain LDL goal < 70 . Current regimen:  o Atorvastatin 40mg  daily . Interventions: o None . Patient self care activities - Over the next 90 days, patient will: o Continue to take atorvastatin 40mg  every day  Diabetes Lab Results  Component Value Date/Time   HGBA1C 7.6 (H) 04/29/2020 09:18 AM   HGBA1C 7.3 (H) 10/15/2019 10:58 AM   . Pharmacist Clinical Goal(s): o Over the next 90 days, patient will work with PharmD and providers to achieve A1c goal <7% . Current regimen:  o Jardiance 25 mg daily  o Basaglar inject 60 units at bedtime o Humalog inject 10 units three times daily with meals . Interventions: o None . Patient self care activities - Over the next 90 days, patient will: o Check  blood sugar before meals and at bedtime, document, and provide at future appointments o Contact provider with any episodes of hypoglycemia  Medication management . Pharmacist Clinical Goal(s): o Over the next 90 days, patient will work with PharmD and providers to maintain optimal medication adherence . [facility] . Interventions o Comprehensive medication review performed. o Continue current medication management strategy . Patient self care activities - Over the next 90 days, patient will: o Focus on medication adherence by providing proof of income for next application o Take medications as prescribed o Report any questions or concerns to PharmD and/or provider(s)       The patient verbalized understanding of instructions, educational materials, and care plan provided today and declined offer to receive copy of patient instructions, educational materials, and care plan.   Telephone follow up appointment with pharmacy team member scheduled for: 09/22/20 at 10:00 AM   [facility] [phone]

## 2020-08-23 NOTE — Chronic Care Management (AMB) (Signed)
° ° °  Chronic Care Management Pharmacy Assistant   Name: Edgar [name]  MRN: [id number] DOB: 06-02-1946  Reason for Encounter: Patient Assistance Coordination  PCP : Edgar Grana, PA-C  08/23/2020- Patient assistance forms filled out for Jardiance 25 mg with [employer] patient assistance program, Humalog Kwikpen 100 u/ml and Basaglar Kwikpen 100 u/ml with [place] patient assistance program.  08/24/2020- Called patient to inform about patient assistance applications. Patient states his daughter Edgar [name] handles all his medication information and would like me to call her. Tilda Franco, she is aware I am placing all forms in the mail to patient and when he receives he will need to sign forms, gather income documentation and bring to PCP office for provider to sign and fax.  [name], CPP notified.  Allergies:  No Known Allergies  Medications: Outpatient Encounter Medications as of 08/23/2020  Medication Sig Note   acetaminophen (TYLENOL) 500 MG tablet Take 500 mg by mouth every 6 (six) hours as needed. 12/07/2016: PRN   amLODipine (NORVASC) 10 MG tablet Take 1 tablet (10 mg total) by mouth daily.    atorvastatin (LIPITOR) 40 MG tablet TAKE 1 TABLET BY MOUTH EVERYDAY AT BEDTIME    B-D UF III MINI PEN NEEDLES 31G X 5 MM MISC USE ONCE A DAY WITH LONG-ACTING INSULIN, AND UP TO 3X A DAY WITH SHORT-ACTING INSULIN (Patient not taking: Reported on 08/01/2020)    Blood Glucose Monitoring Suppl (ONE TOUCH ULTRA 2) w/Device KIT Check fingerstick blood sugars three times a day; DX E11.65, LON 99 months (Patient not taking: Reported on 08/01/2020)    cholecalciferol (VITAMIN D) 1000 units tablet Take 1,000 Units by mouth 2 (two) times daily.     empagliflozin (JARDIANCE) 25 MG TABS tablet TAKE 1 TABLET BY MOUTH DAILY. (HIGHER DOSE)    fluticasone (FLONASE) 50 MCG/ACT nasal spray Place 2 sprays into both nostrils daily.    glucose blood (ONE TOUCH ULTRA TEST) test strip CHECK FINGERSTICK BLOOD  SUGARS THREE TIMES A DAY, LON 99 MONTHS, for ICD-10 E11.65, E11.21, Z79.4    HUMALOG KWIKPEN 100 UNIT/ML KwikPen INJECT 10 UNITS TOTAL INTO THE SKIN 3 (THREE) TIMES DAILY.    Insulin Glargine (BASAGLAR KWIKPEN) 100 UNIT/ML Inject 60 Units into the skin at bedtime. [employer]    Lancets [facility] ULTRASOFT) lancets Check fingerstick blood sugar three times a day; For IDDM, uncontrolled, ICD-10 E11.65, E11.21, Z79.4, LON 99 months    levocetirizine (XYZAL) 5 MG tablet TAKE 1 TABLET BY MOUTH EVERY DAY IN THE EVENING    losartan (COZAAR) 100 MG tablet TAKE 1 TABLET BY MOUTH EVERY DAY    No facility-administered encounter medications on file as of 08/23/2020.    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

## 2020-08-29 ENCOUNTER — Ambulatory Visit: Payer: Medicare PPO | Admitting: Podiatry

## 2020-08-29 ENCOUNTER — Encounter: Payer: Self-pay | Admitting: Podiatry

## 2020-08-29 ENCOUNTER — Other Ambulatory Visit: Payer: Self-pay

## 2020-08-29 DIAGNOSIS — E0843 Diabetes mellitus due to underlying condition with diabetic autonomic (poly)neuropathy: Secondary | ICD-10-CM | POA: Diagnosis not present

## 2020-08-29 DIAGNOSIS — M79676 Pain in unspecified toe(s): Secondary | ICD-10-CM

## 2020-08-29 DIAGNOSIS — B351 Tinea unguium: Secondary | ICD-10-CM | POA: Diagnosis not present

## 2020-08-29 NOTE — Progress Notes (Signed)
This patient returns to my office for at risk foot care.  This patient requires this care by a professional since this patient will be at risk due to having  Diabetes with diabetic nephropathy.  This patient is unable to cut nails himself since the patient cannot reach his nails.These nails are painful walking and wearing shoes.  This patient presents for at risk foot care today.  General Appearance  Alert, conversant and in no acute stress.  Vascular  Dorsalis pedis and posterior tibial  pulses are palpable  bilaterally.  Capillary return is within normal limits  bilaterally. Temperature is within normal limits  bilaterally.  Neurologic  Senn-Weinstein monofilament wire test within normal limits  bilaterally. Muscle power within normal limits bilaterally.  Nails Thick disfigured discolored nails with subungual debris  from hallux to fifth toes bilaterally. No evidence of bacterial infection or drainage bilaterally.  Orthopedic  No limitations of motion  feet .  No crepitus or effusions noted.  No bony pathology or digital deformities noted.  Skin  normotropic skin with no porokeratosis noted bilaterally.  No signs of infections or ulcers noted.     Onychomycosis  Pain in right toes  Pain in left toes  Consent was obtained for treatment procedures.   Mechanical debridement of nails 1-5  bilaterally performed with a nail nipper.  Filed with dremel without incident.    Return office visit   3 months                   Told patient to return for periodic foot care and evaluation due to potential at risk complications.   Jesiel Garate DPM  

## 2020-09-18 ENCOUNTER — Other Ambulatory Visit: Payer: Self-pay | Admitting: Family Medicine

## 2020-09-18 DIAGNOSIS — I10 Essential (primary) hypertension: Secondary | ICD-10-CM

## 2020-09-22 ENCOUNTER — Telehealth: Payer: Self-pay

## 2020-10-06 ENCOUNTER — Telehealth: Payer: Self-pay

## 2020-10-06 NOTE — Telephone Encounter (Signed)
  Chronic Care Management   Outreach Note  10/06/2020 Name: Edgar [name] MRN: [id number] DOB: November 11, 1945  Primary Care Provider: Delsa Grana, PA-C Reason for referral : Chronic Care Management   An unsuccessful telephone outreach was attempted today. Edgar [name] is currently enrolled in the Chronic Care Management program.   Follow Up Plan:  A HIPAA compliant voice message was left today requesting a return call.    [facility] Care Management [facility] [phone]

## 2020-10-11 ENCOUNTER — Telehealth: Payer: Self-pay

## 2020-10-11 NOTE — Progress Notes (Signed)
Left Voice message to confirmed patient telephone appointment on 10/12/2020 for CCM at 9:30 am with [name] the Clinical pharmacist.    [place] Pharmacist Assistant [phone]

## 2020-10-12 ENCOUNTER — Telehealth: Payer: Self-pay

## 2020-10-12 NOTE — Progress Notes (Deleted)
Chronic Care Management Pharmacy Note  10/12/2020 Name:  Edgar [name] MRN:  [id number] DOB:  1946-04-09  Subjective: Edgar [name] is an 75 y.o. year old male who is a primary patient of Delsa Grana, [place].  The CCM team was consulted for assistance with disease management and care coordination needs.    Engaged with patient by telephone for follow up visit in response to provider referral for pharmacy case management and/or care coordination services.   Consent to Services:  The patient was given information about Chronic Care Management services, agreed to services, and gave verbal consent prior to initiation of services.  Please see initial visit note for detailed documentation.   Patient Care Team: Delsa Grana, PA-C as PCP - General (Family Medicine) Poggi, Marshall Cork, MD as Consulting Physician (Surgery) Neldon Labella, RN as Case Manager Germaine Pomfret, [facility] as Pharmacist (Pharmacist)  Recent office visits: 08/01/20: Patient presented to Delsa Grana, PA-C for follow-up. A1c 7.4%. Patient given blood sugar meter and instructed to check.   Recent consult visits: None in previous 6 months  Hospital visits: None in previous 6 months  Objective:  Lab Results  Component Value Date   CREATININE 1.06 08/01/2020   BUN 14 08/01/2020   GFRNONAA 69 08/01/2020   GFRAA 80 08/01/2020   NA 138 08/01/2020   K 4.2 08/01/2020   CALCIUM 10.0 08/01/2020   CO2 24 08/01/2020    Lab Results  Component Value Date/Time   HGBA1C 7.4 (H) 08/01/2020 09:46 AM   HGBA1C 7.6 (H) 04/29/2020 09:18 AM   MICROALBUR 20.5 10/15/2019 10:58 AM   MICROALBUR 15.2 09/01/2018 09:49 AM    Last diabetic Eye exam:  Lab Results  Component Value Date/Time   HMDIABEYEEXA No Retinopathy 10/22/2019 12:00 AM    Last diabetic Foot exam: No results found for: HMDIABFOOTEX   Lab Results  Component Value Date   CHOL 119 10/15/2019   HDL 36 (L) 10/15/2019   LDLCALC 69 10/15/2019   TRIG 62 10/15/2019    CHOLHDL 3.3 10/15/2019    Hepatic Function Latest Ref Rng & Units 08/01/2020 04/29/2020 10/15/2019  Total Protein 6.1 - 8.1 g/dL 7.4 7.3 7.4  Albumin 3.6 - 5.1 g/dL - - -  AST 10 - 35 U/L _0 ALT 9 - 46 U/L _1 Alk Phosphatase 40 - 115 U/L - - -  Total Bilirubin 0.2 - 1.2 mg/dL 0.6 0.6 0.7    Lab Results  Component Value Date/Time   TSH 2.71 08/26/2017 12:21 PM   TSH 3.00 12/07/2016 09:57 AM    CBC Latest Ref Rng & Units 04/29/2020 01/02/2020 12/02/2018  WBC 3.8 - 10.8 Thousand/uL 5.8 8.8 6.8  Hemoglobin 13.2 - 17.1 g/dL 14.3 15.0 15.6  Hematocrit 38.5 - 50.0 % 44.1 45.7 47.2  Platelets 140 - 400 Thousand/uL 240 217 271    Lab Results  Component Value Date/Time   VD25OH 52 08/26/2017 12:21 PM   VD25OH 45.9 01/06/2016 09:18 AM    Clinical ASCVD: {YES/NO:21197} The ASCVD Risk score ([place]., et al., 2013) failed to calculate for the following reasons:   The valid total cholesterol range is 130 to 320 mg/dL    Depression screen [facility] 2/9 08/01/2020 04/29/2020 12/07/2019  Decreased Interest 0 0 0  Down, Depressed, Hopeless 0 0 0  PHQ - 2 Score 0 0 0  Altered sleeping - - -  Tired, decreased energy - - -  Change in appetite - - -  Feeling bad or failure about yourself  - - -  Trouble concentrating - - -  Moving slowly or fidgety/restless - - -  Suicidal thoughts - - -  PHQ-9 Score - - -  Difficult doing work/chores - - -  Some recent data might be hidden     ***Other: (CHADS2VASc if Afib, MMRC or CAT for COPD, ACT, DEXA)  Social History   Tobacco Use  Smoking Status Former Smoker  . Types: Cigarettes  . Quit date: 09/01/1978  . Years since quitting: 42.1  Smokeless Tobacco Never Used   BP Readings from Last 3 Encounters:  08/01/20 136/82  04/29/20 126/78  01/02/20 133/78   Pulse Readings from Last 3 Encounters:  08/01/20 91  04/29/20 84  01/02/20 61   Wt Readings from Last 3 Encounters:  08/01/20 275 lb 4.8 oz (124.9 kg)  04/29/20 280 lb 11.2  oz (127.3 kg)  01/02/20 240 lb (108.9 kg)    Assessment/Interventions: Review of patient past medical history, allergies, medications, health status, including review of consultants reports, laboratory and other test data, was performed as part of comprehensive evaluation and provision of chronic care management services.   SDOH:  (Social Determinants of Health) assessments and interventions performed: {yes/no:20286}   CCM Care Plan  No Known Allergies  Medications Reviewed Today    Reviewed by [name], DPM (Physician) on 08/29/20 at 64  Med List Status: <None>  Medication Order Taking? Sig Documenting Provider Last Dose Status Informant  acetaminophen (TYLENOL) 500 MG tablet [id number] No Take 500 mg by mouth every 6 (six) hours as needed. [provider] Taking Active Multiple Informants           Med Note Tomasita Crumble, AMBER N   Fri Dec 07, 2016  9:14 AM) PRN  amLODipine (NORVASC) 10 MG tablet [id number] No Take 1 tablet (10 mg total) by mouth daily. Delsa Grana, PA-C Taking Active   atorvastatin (LIPITOR) 40 MG tablet [id number] No TAKE 1 TABLET BY MOUTH EVERYDAY AT BEDTIME Delsa Grana, PA-C Taking Active   B-D UF III MINI PEN NEEDLES 31G X 5 MM MISC [id number] No USE ONCE A DAY WITH LONG-ACTING INSULIN, AND UP TO 3X A DAY WITH SHORT-ACTING INSULIN  Patient not taking: Reported on 08/01/2020   Delsa Grana, PA-C Not Taking Active   Blood Glucose Monitoring Suppl (ONE TOUCH ULTRA 2) w/Device KIT [id number] No Check fingerstick blood sugars three times a day; DX E11.65, LON 99 months  Patient not taking: Reported on 08/01/2020   Arnetha Courser, MD Not Taking Active   cholecalciferol (VITAMIN D) 1000 units tablet [id number] No Take 1,000 Units by mouth 2 (two) times daily.  [provider] Taking Active Multiple Informants  empagliflozin (JARDIANCE) 25 MG TABS tablet [id number] No TAKE 1 TABLET BY MOUTH DAILY. (HIGHER DOSE) Delsa Grana, PA-C Taking Active    fluticasone (FLONASE) 50 MCG/ACT nasal spray [id number] No Place 2 sprays into both nostrils daily. Delsa Grana, PA-C Taking Active   glucose blood (ONE TOUCH ULTRA TEST) test strip [id number]  CHECK FINGERSTICK BLOOD SUGARS THREE TIMES A DAY, LON 99 MONTHS, for ICD-10 E11.65, E11.21, Z79.4 Laurell Roof  Active   HUMALOG KWIKPEN 100 UNIT/ML KwikPen [id number] No INJECT 10 UNITS TOTAL INTO THE SKIN 3 (THREE) TIMES DAILY. Delsa Grana, PA-C Taking Active   Insulin Glargine (BASAGLAR KWIKPEN) 100 UNIT/ML [id number] No Inject 60 Units into the skin at bedtime. [address] [place], [place], PA-C Taking Active   Lancets [facility] ULTRASOFT) lancets  [id number]  Check fingerstick blood sugar three times a day; For IDDM, uncontrolled, ICD-10 E11.65, E11.21, Z79.4, LON 99 months Delsa Grana, PA-C  Active   levocetirizine (XYZAL) 5 MG tablet [id number] No TAKE 1 TABLET BY MOUTH EVERY DAY IN THE EVENING Delsa Grana, PA-C Taking Active   losartan (COZAAR) 100 MG tablet [id number] No TAKE 1 TABLET BY MOUTH EVERY DAY Delsa Grana, PA-C Taking Active           Patient Active Problem List   Diagnosis Date Noted  . Rhinitis 08/01/2020  . Aortic atherosclerosis ([place]) 08/01/2020  . Coronary artery disease 10/25/2017  . 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[place]) 05/14/2017  . History of colonoscopy with polypectomy 12/07/2016  . Fatty liver 09/11/2016  . Aneurysm of aorta ([place]) 09/11/2016  . 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 Tinea pedis 06/25/2016  . Hx of pulmonary embolus 05/18/2016  . Essential hypertension, benign 12/23/2015  . Hyperlipidemia 12/23/2015  . Morbid obesity ([place]) 12/23/2015  . Type 2 diabetes mellitus with diabetic nephropathy, 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[place]) 01/25/2015    Immunization History  Administered Date(s) Administered  . Fluad Quad(high Dose 65+) 04/14/2019, 04/29/2020  . Influenza, High Dose Seasonal PF  05/30/2015, 04/30/2017, 05/29/2018  . Influenza-Unspecified 04/25/2016  . Moderna Sars-Covid-2 Vaccination 09/18/2019, 10/16/2019, 07/05/2020  . Pneumococcal Conjugate-13 10/08/2013  . Pneumococcal Polysaccharide-23 08/26/2017  . Pneumococcal-Unspecified 04/17/2010  . Td 05/13/2009  . Zoster 10/11/2013    Conditions to be addressed/monitored:  Hypertension, Hyperlipidemia, Diabetes and Coronary Artery Disease  There are no care plans that you recently modified to display for this patient.    Medication Assistance: {MEDASSISTANCEINFO:25044}  Patient's preferred pharmacy is:  [id number] [address] [id number]: [phone]: [phone] Uses pill box? {Yes or If no, why not?:20788} Pt endorses ***% compliance  We discussed: {Pharmacy options:24294} Patient decided to: {US Pharmacy Plan:23885}  Care Plan and Follow Up Patient Decision:  {FOLLOWUP:24991}  Plan: {CM FOLLOW UP PLAN:25073}  ***   Current Barriers:  . {pharmacybarriers:24917} . ***  Pharmacist Clinical Goal(s):  .Marland KitchenOver the next *** days, patient will {PHARMACYGOALCHOICES:24921} through collaboration with PharmD and provider.  . ***  Interventions: . 1:1 collaboration with TDelsa Grana PA-C regarding development and update of comprehensive plan of care as evidenced by provider attestation and co-signature . Inter-disciplinary care team collaboration (see longitudinal plan of care) . Comprehensive medication review performed; medication list updated in electronic medical record  Hypertension (BP goal {CHL HP UPSTREAM Pharmacist BP ranges:[phone]}) -{US controlled/uncontrolled:25276} -Current treatment: . Amlodipine 10 mg daily  . Losartan 100 mg daily  -Medications previously tried: ***  -Current home readings: *** -Current dietary habits: *** -Current exercise habits: *** -{ACTIONS;DENIES/REPORTS:21021675::"Denies"}  hypotensive/hypertensive symptoms -Educated on {CCM BP Counseling:25124} -Counseled to monitor BP at home ***, document, and provide log at future appointments -{CCMPHARMDINTERVENTION:25122}  Hyperlipidemia: (LDL goal < ***) -{US controlled/uncontrolled:25276} -Current treatment: . Atorvastatin 40 mg daily  -Medications previously tried: ***  -Current dietary patterns: *** -Current exercise habits: *** -Educated on {CCM HLD Counseling:25126} -{CCMPHARMDINTERVENTION:25122}  Diabetes (A1c goal {A1c goals:23924}) -{US controlled/uncontrolled:25276} -Current medications: . Jardiance 25 mg daily  . Humalog 10 units three times daily  . Basaglar 60 units nightly  -Medications previously tried: ***  -Current home glucose readings . fasting glucose: *** . post prandial glucose: *** -{ACTIONS;DENIES/REPORTS:21021675::"Denies"} hypoglycemic/hyperglycemic symptoms -Current meal patterns:  . breakfast: ***  .  lunch: ***  . dinner: *** . snacks: *** . drinks: *** -Current exercise: *** -Educated on {CCM DM COUNSELING:25123} -Counseled to check feet daily and get yearly eye exams -{CCMPHARMDINTERVENTION:25122}   Patient Goals/Self-Care Activities . Over the next *** days, patient will:  - {pharmacypatientgoals:24919}  Follow Up Plan: {CM FOLLOW UP GUYQ:03474}

## 2020-10-12 NOTE — Telephone Encounter (Signed)
Pts daughter called and [place] needs a signed a dated copy of the Humalog RX faxed to them/ please fax to fax# [phone]/ please advise when sent

## 2020-10-19 ENCOUNTER — Ambulatory Visit: Payer: Self-pay

## 2020-10-19 NOTE — Chronic Care Management (AMB) (Signed)
  Chronic Care Management   Note  10/19/2020 Name: Edgar [name] MRN: [id number] DOB: 1945/12/21    [name] reports doing well today but busy at the time of the call. Prefers to reschedule outreach for next week. Encouraged to call if needed prior to the scheduled outreach.   Follow up plan: Will follow up next week as requested.    Cristy [facility] Care Management [facility] [phone]

## 2020-10-25 ENCOUNTER — Telehealth: Payer: Self-pay

## 2020-10-25 NOTE — Progress Notes (Signed)
Spoke to patient to confirmed patient telephone appointment on 10/26/2020  for CCM at 1:00 pm with [name] the Clinical pharmacist.   Patient verbalized understanding.Patient states he is having problems with pharmacy not having his medications on stock.  Patient states he has a eye appointment at 2:30 and needs to be finish with his appointment with the clinical pharmacist at 2:00pm.

## 2020-10-26 ENCOUNTER — Ambulatory Visit (INDEPENDENT_AMBULATORY_CARE_PROVIDER_SITE_OTHER): Payer: Medicare PPO

## 2020-10-26 DIAGNOSIS — E1169 Type 2 diabetes mellitus with other specified complication: Secondary | ICD-10-CM

## 2020-10-26 DIAGNOSIS — E1159 Type 2 diabetes mellitus with other circulatory complications: Secondary | ICD-10-CM

## 2020-10-26 DIAGNOSIS — Z794 Long term (current) use of insulin: Secondary | ICD-10-CM

## 2020-10-26 DIAGNOSIS — E785 Hyperlipidemia, unspecified: Secondary | ICD-10-CM

## 2020-10-26 DIAGNOSIS — E1121 Type 2 diabetes mellitus with diabetic nephropathy: Secondary | ICD-10-CM

## 2020-10-26 DIAGNOSIS — I2510 Atherosclerotic heart disease of native coronary artery without angina pectoris: Secondary | ICD-10-CM

## 2020-10-26 DIAGNOSIS — I152 Hypertension secondary to endocrine disorders: Secondary | ICD-10-CM

## 2020-10-26 LAB — HM DIABETES EYE EXAM

## 2020-10-26 NOTE — Telephone Encounter (Signed)
Pts daughter states that the pt only has 2 injections left of the humalog. She states that she has been calling for weeks and still has not received a response. She is requesting to have this medication sent to [facility] today.   Updated Fax# [phone]

## 2020-10-26 NOTE — Progress Notes (Signed)
Chronic Care Management Pharmacy Note  10/27/2020 Name:  Edgar [name] MRN:  [id number] DOB:  04-27-1946  Subjective: Edgar [name] is an 75 y.o. year old male who is a primary patient of Delsa Grana, [place].  The CCM team was consulted for assistance with disease management and care coordination needs.    Engaged with patient by telephone for follow up visit in response to provider referral for pharmacy case management and/or care coordination services.   Consent to Services:  The patient was given information about Chronic Care Management services, agreed to services, and gave verbal consent prior to initiation of services.  Please see initial visit note for detailed documentation.   Patient Care Team: Delsa Grana, PA-C as PCP - General (Family Medicine) Poggi, Marshall Cork, MD as Consulting Physician (Surgery) Neldon Labella, RN as Case Manager Germaine Pomfret, [facility] as Pharmacist (Pharmacist)  Recent office visits: 08/01/20: Patient presented to Delsa Grana, PA-C for follow-up. A1c 7.4%. Patient given blood sugar meter and instructed to check.   Recent consult visits: None in previous 6 months  Hospital visits: None in previous 6 months  Objective:  Lab Results  Component Value Date   CREATININE 1.06 08/01/2020   BUN 14 08/01/2020   GFRNONAA 69 08/01/2020   GFRAA 80 08/01/2020   NA 138 08/01/2020   K 4.2 08/01/2020   CALCIUM 10.0 08/01/2020   CO2 24 08/01/2020    Lab Results  Component Value Date/Time   HGBA1C 7.4 (H) 08/01/2020 09:46 AM   HGBA1C 7.6 (H) 04/29/2020 09:18 AM   MICROALBUR 20.5 10/15/2019 10:58 AM   MICROALBUR 15.2 09/01/2018 09:49 AM    Last diabetic Eye exam:  Lab Results  Component Value Date/Time   HMDIABEYEEXA No Retinopathy 10/22/2019 12:00 AM    Last diabetic Foot exam: No results found for: HMDIABFOOTEX   Lab Results  Component Value Date   CHOL 119 10/15/2019   HDL 36 (L) 10/15/2019   LDLCALC 69 10/15/2019   TRIG 62 10/15/2019    CHOLHDL 3.3 10/15/2019    Hepatic Function Latest Ref Rng & Units 08/01/2020 04/29/2020 10/15/2019  Total Protein 6.1 - 8.1 g/dL 7.4 7.3 7.4  Albumin 3.6 - 5.1 g/dL - - -  AST 10 - 35 U/L _0 ALT 9 - 46 U/L _1 Alk Phosphatase 40 - 115 U/L - - -  Total Bilirubin 0.2 - 1.2 mg/dL 0.6 0.6 0.7    Lab Results  Component Value Date/Time   TSH 2.71 08/26/2017 12:21 PM   TSH 3.00 12/07/2016 09:57 AM    CBC Latest Ref Rng & Units 04/29/2020 01/02/2020 12/02/2018  WBC 3.8 - 10.8 Thousand/uL 5.8 8.8 6.8  Hemoglobin 13.2 - 17.1 g/dL 14.3 15.0 15.6  Hematocrit 38.5 - 50.0 % 44.1 45.7 47.2  Platelets 140 - 400 Thousand/uL 240 217 271    Lab Results  Component Value Date/Time   VD25OH 52 08/26/2017 12:21 PM   VD25OH 45.9 01/06/2016 09:18 AM    Clinical ASCVD: No  The ASCVD Risk score Mikey Bussing DC Jr., et al., 2013) failed to calculate for the following reasons:   The valid total cholesterol range is 130 to 320 mg/dL    Depression screen [facility] 2/9 08/01/2020 04/29/2020 12/07/2019  Decreased Interest 0 0 0  Down, Depressed, Hopeless 0 0 0  PHQ - 2 Score 0 0 0  Altered sleeping - - -  Tired, decreased energy - - -  Change in appetite - - -  Feeling bad or failure about yourself  - - -  Trouble concentrating - - -  Moving slowly or fidgety/restless - - -  Suicidal thoughts - - -  PHQ-9 Score - - -  Difficult doing work/chores - - -  Some recent data might be hidden    Social History   Tobacco Use  Smoking Status Former Smoker  . Types: Cigarettes  . Quit date: 09/01/1978  . Years since quitting: 42.1  Smokeless Tobacco Never Used   BP Readings from Last 3 Encounters:  08/01/20 136/82  04/29/20 126/78  01/02/20 133/78   Pulse Readings from Last 3 Encounters:  08/01/20 91  04/29/20 84  01/02/20 61   Wt Readings from Last 3 Encounters:  08/01/20 275 lb 4.8 oz (124.9 kg)  04/29/20 280 lb 11.2 oz (127.3 kg)  01/02/20 240 lb (108.9 kg)    Assessment/Interventions:  Review of patient past medical history, allergies, medications, health status, including review of consultants reports, laboratory and other test data, was performed as part of comprehensive evaluation and provision of chronic care management services.   SDOH:  (Social Determinants of Health) assessments and interventions performed: Yes SDOH Interventions   Flowsheet Row Most Recent Value  SDOH Interventions   Financial Strain Interventions Other (Comment)  [PAP]      CCM Care Plan  No Known Allergies  Medications Reviewed Today    Reviewed by Germaine Pomfret, [facility] (Pharmacist) on 10/27/20 at [place] List Status: <None>  Medication Order Taking? Sig Documenting Provider Last Dose Status Informant  acetaminophen (TYLENOL) 500 MG tablet [id number] No Take 500 mg by mouth every 6 (six) hours as needed. [provider] Taking Active Multiple Informants           Med Note Tomasita Crumble, AMBER N   Fri Dec 07, 2016  9:14 AM) PRN  amLODipine (NORVASC) 10 MG tablet [id number]  TAKE 1 TABLET BY MOUTH EVERY DAY Delsa Grana, PA-C  Active   atorvastatin (LIPITOR) 40 MG tablet [id number] No TAKE 1 TABLET BY MOUTH EVERYDAY AT BEDTIME Delsa Grana, PA-C Taking Active   B-D UF III MINI PEN NEEDLES 31G X 5 MM MISC [id number] No USE ONCE A DAY WITH LONG-ACTING INSULIN, AND UP TO 3X A DAY WITH SHORT-ACTING INSULIN  Patient not taking: Reported on 08/01/2020   Delsa Grana, PA-C Not Taking Active   Blood Glucose Monitoring Suppl (ONE TOUCH ULTRA 2) w/Device KIT [id number] No Check fingerstick blood sugars three times a day; DX E11.65, LON 99 months  Patient not taking: Reported on 08/01/2020   Arnetha Courser, MD Not Taking Active   cholecalciferol (VITAMIN D) 1000 units tablet [id number] No Take 1,000 Units by mouth 2 (two) times daily.  [provider] Taking Active Multiple Informants  empagliflozin (JARDIANCE) 25 MG TABS tablet [id number] No TAKE 1 TABLET BY MOUTH DAILY. (HIGHER DOSE) Delsa Grana, PA-C Taking Active   fluticasone (FLONASE) 50 MCG/ACT nasal spray [id number] No Place 2 sprays into both nostrils daily. Delsa Grana, PA-C Taking Active   glucose blood (ONE TOUCH ULTRA TEST) test strip [id number]  CHECK FINGERSTICK BLOOD SUGARS THREE TIMES A DAY, LON 99 MONTHS, for ICD-10 E11.65, E11.21, Z79.4 Laurell Roof  Active   HUMALOG KWIKPEN 100 UNIT/ML KwikPen [id number] No INJECT 10 UNITS TOTAL INTO THE SKIN 3 (THREE) TIMES DAILY. Delsa Grana, PA-C Taking Active   Insulin Glargine (BASAGLAR KWIKPEN) 100 UNIT/ML [id number] No Inject 60 Units into the skin at bedtime. Bassett,  Kristeen Miss, PA-C Taking Active   Lancets [facility] ULTRASOFT) lancets [id number]  Check fingerstick blood sugar three times a day; For IDDM, uncontrolled, ICD-10 E11.65, E11.21, Z79.4, LON 99 months Delsa Grana, PA-C  Active   levocetirizine (XYZAL) 5 MG tablet [id number] No TAKE 1 TABLET BY MOUTH EVERY DAY IN THE EVENING Delsa Grana, PA-C Taking Active   losartan (COZAAR) 100 MG tablet [id number] No TAKE 1 TABLET BY MOUTH EVERY DAY Delsa Grana, PA-C Taking Active           Patient Active Problem List   Diagnosis Date Noted  . Rhinitis 08/01/2020  . Aortic atherosclerosis ([place]) 08/01/2020  . Coronary artery disease 10/25/2017  . 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[place]) 05/14/2017  . History of colonoscopy with polypectomy 12/07/2016  . Fatty liver 09/11/2016  . Aneurysm of aorta ([place]) 09/11/2016  . 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 Tinea pedis 06/25/2016  . Hx of pulmonary embolus 05/18/2016  . Essential hypertension, benign 12/23/2015  . Hyperlipidemia 12/23/2015  . Morbid obesity ([place]) 12/23/2015  . Type 2 diabetes mellitus with diabetic nephropathy, 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[place]) 01/25/2015    Immunization History  Administered Date(s) Administered  . Fluad Quad(high Dose 65+) 04/14/2019, 04/29/2020  . Influenza,  High Dose Seasonal PF 05/30/2015, 04/30/2017, 05/29/2018  . Influenza-Unspecified 04/25/2016  . Moderna Sars-Covid-2 Vaccination 09/18/2019, 10/16/2019, 07/05/2020  . Pneumococcal Conjugate-13 10/08/2013  . Pneumococcal Polysaccharide-23 08/26/2017  . Pneumococcal-Unspecified 04/17/2010  . Td 05/13/2009  . Zoster 10/11/2013    Conditions to be addressed/monitored:  Hypertension, Hyperlipidemia, Diabetes and Coronary Artery Disease  Care Plan : General Pharmacy (Adult)  Updates made by Germaine Pomfret, RPH since 10/27/2020 12:00 AM    Problem: Hypertension, Hyperlipidemia, Diabetes and Coronary Artery Disease   Priority: High    Long-Range Goal: Patient-Specific Goal   Start Date: 10/26/2020  Expected End Date: 04/29/2021  This Visit's Progress: On track  Priority: High  Note:   Current Barriers:  . Unable to independently afford treatment regimen  Pharmacist Clinical Goal(s):  Marland Kitchen Over the next 90 days, patient will verbalize ability to afford treatment regimen . maintain control of diabetes as evidenced by A1c less than 8%  through collaboration with PharmD and provider.   Interventions: . 1:1 collaboration with Delsa Grana, PA-C regarding development and update of comprehensive plan of care as evidenced by provider attestation and co-signature . Inter-disciplinary care team collaboration (see longitudinal plan of care) . Comprehensive medication review performed; medication list updated in electronic medical record  Hypertension (BP goal <140/90) -Controlled -Current treatment: . Amlodipine 10 mg daily  . Losartan 100 mg daily  -Medications previously tried: NA  -Current home readings: NA -Denies hypotensive/hypertensive symptoms -Educated on Importance of home blood pressure monitoring; -Counseled to monitor BP at home weekly, document, and provide log at future appointments -Recommended to continue current medication  Hyperlipidemia: (LDL goal <  70) -Controlled -Current treatment: . Atorvastatin 40 mg daily  -Medications previously tried: NA  -Educated on Importance of limiting foods high in cholesterol; -Recommended to continue current medication  Diabetes (A1c goal <8%) -Controlled -Current medications: . Jardiance 25 mg daily  . Humalog 10 units three times daily  . Basaglar 60 units nightly  -Medications previously tried: NA  -Current home glucose readings . fasting glucose: Patient did not have monitor with him -Denies hypoglycemic/hyperglycemic symptoms - Patient is interested in coming off insulin if possible.  -Educated on Proper insulin injection  technique; Prevention and management of hypoglycemic episodes; -Counseled to check feet daily and get yearly eye exams -Could consider retrial of metformin ER 500 mg daily to help with reducing insulin burden - Patient would be a candidate for CGM given multiple daily injections - Patient assistance for Trulicity approved, Humalog still awaiting approval, resubmitted today.    Patient Goals/Self-Care Activities . Over the next 90 days, patient will:  - check glucose four times daily: before meals and at bedtime , document, and provide at future appointments check blood pressure weekly, document, and provide at future appointments  Follow Up Plan: Telephone follow up appointment with care management team member scheduled for:  12/08/2020 at 1:00 PM      Medication Assistance: Application for Humalog, Basaglar, Jardiance  medication assistance program. in process.  Anticipated assistance start date 11/02/2020.  See plan of care for additional detail.  Patient's preferred pharmacy is:  [id number] [place]Phone: [phone]: [phone] Uses pill box? Yes Pt endorses 100% compliance  We discussed: Current pharmacy is preferred with insurance plan and patient is satisfied with pharmacy  services Patient decided to: Continue current medication management strategy  Care Plan and Follow Up Patient Decision:  Patient agrees to Care Plan and Follow-up.  Plan: Telephone follow up appointment with care management team member scheduled for:  12/08/2020 at 1:00 PM  ADoristine Section [phone]

## 2020-10-27 ENCOUNTER — Telehealth: Payer: Medicare PPO

## 2020-10-27 ENCOUNTER — Telehealth: Payer: Self-pay

## 2020-10-27 NOTE — Telephone Encounter (Signed)
  Chronic Care Management   Outreach Note  10/27/2020 Name: Edgar [name] MRN: [id number] DOB: June 05, 1946  Primary Care Provider: Delsa Grana, PA-C Reason for referral : Chronic Care Management   An unsuccessful telephone outreach was attempted today. [name] is currently enrolled in the chronic care management program.    Follow Up Plan:  Unable to leave a voice message today due to the voice mailbox being full. A member of the care management team will attempt to reach [name] within the next week.    Cristy [facility] Care Management [facility] [phone]

## 2020-10-27 NOTE — Telephone Encounter (Signed)
Paperwork sent in to [employer]. Called patient's daughter [name] to inform her to be on the lookout for communication form [place] about his Humalog.

## 2020-10-27 NOTE — Telephone Encounter (Signed)
Have this paperwork been sent

## 2020-10-27 NOTE — Patient Instructions (Signed)
Visit Information It was great speaking with you today!  Please let me know if you have any questions about our visit.  Goals Addressed              This Visit's Progress   .  COMPLETED: "I need assistance getting my lantus" (pt-stated)        CARE PLAN ENTRY (see longtitudinal plan of care for additional care plan information)  Current Barriers:  Marland Kitchen Medication procurement needs in patient with DMII, Diabetic nephropathy, and HTN at risk for worsening DM and renal disease state if unable to obtain medications and adhere to prescribed regimen  Clinical Social Work Clinical Goal(s):  Marland Kitchen Over the next 30 days, patient will work with the pharmacy team  to address needs related to medication costs/needs -completed  Interventions: . Follow up phone call to patient's daughter-[name] Hoopingarner to confirm referral to the [place] team for medication assistance for patient and to assess for any additional community resource needs . Confirmed that the pharmacy team has reached out to patient and his daughter to discuss medication assistance  . Explored additional community resource needs, confirmed that patient has no additional community resource needs  Patient Self Care Activities:  . Patient verbalizes understanding of plan to work with the th pharmacy team regarding possible medication assistance . Attends all scheduled provider appointments . Has difficulty affording medications, specifically Lantus  Please see past updates related to this goal by clicking on the "Past Updates" button in the selected goal      .  Monitor and Manage My Blood Sugar-Diabetes Type 2        Timeframe:  Long-Range Goal Priority:  High Start Date:  10/27/2020                           Expected End Date:  04/29/2021                     Follow Up Date 12/03/2020    - check blood sugar four times daily: before meals and at bedtime  - check blood sugar if I feel it is too high or too low - enter blood  sugar readings and medication or insulin into daily log - take the blood sugar log to all doctor visits    Why is this important?    Checking your blood sugar at home helps to keep it from getting very high or very low.   Writing the results in a diary or log helps the doctor know how to care for you.   Your blood sugar log should have the time, date and the results.   Also, write down the amount of insulin or other medicine that you take.   Other information, like what you ate, exercise done and how you were feeling, will also be helpful.     Notes:        Patient Care Plan: General Pharmacy (Adult)    Problem Identified: Hypertension, Hyperlipidemia, Diabetes and Coronary Artery Disease   Priority: High    Long-Range Goal: Patient-Specific Goal   Start Date: 10/26/2020  Expected End Date: 04/29/2021  This Visit's Progress: On track  Priority: High  Note:   Current Barriers:  . Unable to independently afford treatment regimen  Pharmacist Clinical Goal(s):  Marland Kitchen Over the next 90 days, patient will verbalize ability to afford treatment regimen . maintain control of diabetes as evidenced by A1c less than  8%  through collaboration with PharmD and provider.   Interventions: . 1:1 collaboration with Delsa Grana, PA-C regarding development and update of comprehensive plan of care as evidenced by provider attestation and co-signature . Inter-disciplinary care team collaboration (see longitudinal plan of care) . Comprehensive medication review performed; medication list updated in electronic medical record  Hypertension (BP goal <140/90) -Controlled -Current treatment: . Amlodipine 10 mg daily  . Losartan 100 mg daily  -Medications previously tried: NA  -Current home readings: NA -Denies hypotensive/hypertensive symptoms -Educated on Importance of home blood pressure monitoring; -Counseled to monitor BP at home weekly, document, and provide log at future  appointments -Recommended to continue current medication  Hyperlipidemia: (LDL goal < 70) -Controlled -Current treatment: . Atorvastatin 40 mg daily  -Medications previously tried: NA  -Educated on Importance of limiting foods high in cholesterol; -Recommended to continue current medication  Diabetes (A1c goal <8%) -Controlled -Current medications: . Jardiance 25 mg daily  . Humalog 10 units three times daily  . Basaglar 60 units nightly  -Medications previously tried: NA  -Current home glucose readings . fasting glucose: Patient did not have monitor with him -Denies hypoglycemic/hyperglycemic symptoms - Patient is interested in coming off insulin if possible.  -Educated on Proper insulin injection technique; Prevention and management of hypoglycemic episodes; -Counseled to check feet daily and get yearly eye exams -Could consider retrial of metformin ER 500 mg daily to help with reducing insulin burden - Patient would be a candidate for CGM given multiple daily injections - Patient assistance for Trulicity approved, Humalog still awaiting approval, resubmitted today.    Patient Goals/Self-Care Activities . Over the next 90 days, patient will:  - check glucose four times daily: before meals and at bedtime , document, and provide at future appointments check blood pressure weekly, document, and provide at future appointments  Follow Up Plan: Telephone follow up appointment with care management team member scheduled for:  12/08/2020 at 1:00 PM      Patient agreed to services and verbal consent obtained.   The patient verbalized understanding of instructions, educational materials, and care plan provided today and declined offer to receive copy of patient instructions, educational materials, and care plan.   Doristine Section, [place] [facility] [phone]

## 2020-10-27 NOTE — Telephone Encounter (Signed)
Appointment 3/25 will notify in person

## 2020-10-28 ENCOUNTER — Other Ambulatory Visit: Payer: Self-pay

## 2020-10-28 ENCOUNTER — Encounter: Payer: Self-pay | Admitting: Physician Assistant

## 2020-10-28 ENCOUNTER — Ambulatory Visit (INDEPENDENT_AMBULATORY_CARE_PROVIDER_SITE_OTHER): Payer: Medicare PPO | Admitting: Physician Assistant

## 2020-10-28 VITALS — BP 118/78 | HR 90 | Temp 98.0°F | Resp 16 | Ht 73.0 in | Wt 276.1 lb

## 2020-10-28 DIAGNOSIS — E782 Mixed hyperlipidemia: Secondary | ICD-10-CM | POA: Diagnosis not present

## 2020-10-28 DIAGNOSIS — Z794 Long term (current) use of insulin: Secondary | ICD-10-CM

## 2020-10-28 DIAGNOSIS — I10 Essential (primary) hypertension: Secondary | ICD-10-CM

## 2020-10-28 DIAGNOSIS — E1121 Type 2 diabetes mellitus with diabetic nephropathy: Secondary | ICD-10-CM

## 2020-10-28 LAB — POCT GLYCOSYLATED HEMOGLOBIN (HGB A1C): Hemoglobin A1C: 7.4 % — AB (ref 4.0–5.6)

## 2020-10-28 MED ORDER — LOSARTAN POTASSIUM 100 MG PO TABS: 100.0000 mg | ORAL_TABLET | Freq: Every day | ORAL | 1 refills | Status: DC

## 2020-10-28 MED ORDER — BD PEN NEEDLE MINI U/F 31G X 5 MM MISC: 3 refills | Status: DC

## 2020-10-28 NOTE — Patient Instructions (Signed)
Diabetes Mellitus and Exercise Exercising regularly is important for overall health, especially for people who have diabetes mellitus. Exercising is not only about losing weight. It has many other health benefits, such as increasing muscle strength and bone density and reducing body fat and stress. This leads to improved fitness, flexibility, and endurance, all of which result in better overall health. What are the benefits of exercise if I have diabetes? Exercise has many benefits for people with diabetes. They include:  Helping to lower and control blood sugar (glucose).  Helping the body to respond better to the hormone insulin by improving insulin sensitivity.  Reducing how much insulin the body needs.  Lowering the risk for heart disease by: ? Lowering "bad" cholesterol and triglyceride levels. ? Increasing "good" cholesterol levels. ? Lowering blood pressure. ? Lowering blood glucose levels. What is my activity plan? Your health care provider or certified diabetes educator can help you make a plan for the type and frequency of exercise that works for you. This is called your activity plan. Be sure to:  Get at least 150 minutes of medium-intensity or high-intensity exercise each week. Exercises may include brisk walking, biking, or water aerobics.  Do stretching and strengthening exercises, such as yoga or weight lifting, at least 2 times a week.  Spread out your activity over at least 3 days of the week.  Get some form of physical activity each day. ? Do not go more than 2 days in a row without some kind of physical activity. ? Avoid being inactive for more than 90 minutes at a time. Take frequent breaks to walk or stretch.  Choose exercises or activities that you enjoy. Set realistic goals.  Start slowly and gradually increase your exercise intensity over time.   How do I manage my diabetes during exercise? Monitor your blood glucose  Check your blood glucose before and  after exercising. If your blood glucose is: ? 240 mg/dL (13.3 mmol/L) or higher before you exercise, check your urine for ketones. These are chemicals created by the liver. If you have ketones in your urine, do not exercise until your blood glucose returns to normal. ? 100 mg/dL (5.6 mmol/L) or lower, eat a snack containing 15-20 grams of carbohydrate. Check your blood glucose 15 minutes after the snack to make sure that your glucose level is above 100 mg/dL (5.6 mmol/L) before you start your exercise.  Know the symptoms of low blood glucose (hypoglycemia) and how to treat it. Your risk for hypoglycemia increases during and after exercise. Follow these tips and your health care provider's instructions  Keep a carbohydrate snack that is fast-acting for use before, during, and after exercise to help prevent or treat hypoglycemia.  Avoid injecting insulin into areas of the body that are going to be exercised. For example, avoid injecting insulin into: ? Your arms, when you are about to play tennis. ? Your legs, when you are about to go jogging.  Keep records of your exercise habits. Doing this can help you and your health care provider adjust your diabetes management plan as needed. Write down: ? Food that you eat before and after you exercise. ? Blood glucose levels before and after you exercise. ? The type and amount of exercise you have done.  Work with your health care provider when you start a new exercise or activity. He or she may need to: ? Make sure that the activity is safe for you. ? Adjust your insulin, other medicines, and food that   you eat.  Drink plenty of water while you exercise. This prevents loss of water (dehydration) and problems caused by a lot of heat in the body (heat stroke).   Where to find more information  American Diabetes Association: www.diabetes.org Summary  Exercising regularly is important for overall health, especially for people who have diabetes  mellitus.  Exercising has many health benefits. It increases muscle strength and bone density and reduces body fat and stress. It also lowers and controls blood glucose.  Your health care provider or certified diabetes educator can help you make an activity plan for the type and frequency of exercise that works for you.  Work with your health care provider to make sure any new activity is safe for you. Also work with your health care provider to adjust your insulin, other medicines, and the food you eat. This information is not intended to replace advice given to you by your health care provider. Make sure you discuss any questions you have with your health care provider. Document Revised: 04/20/2019 Document Reviewed: 04/20/2019 Elsevier Patient Education  2021 Elsevier Inc.  

## 2020-10-29 LAB — COMPREHENSIVE METABOLIC PANEL
AG Ratio: 1.2 (calc) (ref 1.0–2.5)
ALT: 9 U/L (ref 9–46)
AST: 13 U/L (ref 10–35)
Albumin: 4 g/dL (ref 3.6–5.1)
Alkaline phosphatase (APISO): 63 U/L (ref 35–144)
BUN: 14 mg/dL (ref 7–25)
CO2: 25 mmol/L (ref 20–32)
Calcium: 10 mg/dL (ref 8.6–10.3)
Chloride: 106 mmol/L (ref 98–110)
Creat: 1.04 mg/dL (ref 0.70–1.18)
Globulin: 3.4 g/dL (calc) (ref 1.9–3.7)
Glucose, Bld: 120 mg/dL — ABNORMAL HIGH (ref 65–99)
Potassium: 4.5 mmol/L (ref 3.5–5.3)
Sodium: 140 mmol/L (ref 135–146)
Total Bilirubin: 0.6 mg/dL (ref 0.2–1.2)
Total Protein: 7.4 g/dL (ref 6.1–8.1)

## 2020-10-29 LAB — CBC WITH DIFFERENTIAL/PLATELET
Absolute Monocytes: 583 cells/uL (ref 200–950)
Basophils Absolute: 68 cells/uL (ref 0–200)
Basophils Relative: 1.1 %
Eosinophils Absolute: 192 cells/uL (ref 15–500)
Eosinophils Relative: 3.1 %
HCT: 47.2 % (ref 38.5–50.0)
Hemoglobin: 15.4 g/dL (ref 13.2–17.1)
Lymphs Abs: 2052 cells/uL (ref 850–3900)
MCH: 28.4 pg (ref 27.0–33.0)
MCHC: 32.6 g/dL (ref 32.0–36.0)
MCV: 86.9 fL (ref 80.0–100.0)
MPV: 9.7 fL (ref 7.5–12.5)
Monocytes Relative: 9.4 %
Neutro Abs: 3305 cells/uL (ref 1500–7800)
Neutrophils Relative %: 53.3 %
Platelets: 260 10*3/uL (ref 140–400)
RBC: 5.43 10*6/uL (ref 4.20–5.80)
RDW: 13.7 % (ref 11.0–15.0)
Total Lymphocyte: 33.1 %
WBC: 6.2 10*3/uL (ref 3.8–10.8)

## 2020-10-29 LAB — LIPID PANEL
Cholesterol: 118 mg/dL (ref <200)
HDL: 38 mg/dL — ABNORMAL LOW (ref >=40)
LDL Cholesterol (Calc): 65 mg/dL (calc)
Non-HDL Cholesterol (Calc): 80 mg/dL (calc) (ref <130)
Total CHOL/HDL Ratio: 3.1 (calc) (ref <5.0)
Triglycerides: 74 mg/dL (ref <150)

## 2020-10-31 ENCOUNTER — Ambulatory Visit: Payer: Medicare PPO | Admitting: Family Medicine

## 2020-11-01 NOTE — Progress Notes (Signed)
Established patient visit   Patient: Edgar [name]   DOB: 07-Dec-1945   75 y.o. Male  MRN: [id number] Visit Date: 10/28/2020  Today's healthcare provider: Trinna Post, PA-C   Chief Complaint  Patient presents with  . Follow-up  . Hypertension  . Diabetes  . Hyperlipidemia  . Hand Pain    Right pointer hit on door several months ago   Subjective    HPI HPI    Hand Pain    Comments: Right pointer hit on door several months ago       Last edited by Cathrine Muster, [place] on 10/28/2020  9:00 AM. (History)      Hypertension, follow-up  BP Readings from Last 3 Encounters:  10/28/20 118/78  08/01/20 136/82  04/29/20 126/78   Wt Readings from Last 3 Encounters:  10/28/20 276 lb 1.6 oz (125.2 kg)  08/01/20 275 lb 4.8 oz (124.9 kg)  04/29/20 280 lb 11.2 oz (127.3 kg)     He was last seen for hypertension 3 months ago.  BP at that visit was normal. Management since that visit includes continue amlodipine 10 mg daily and losartan 100 mg daily.  He reports excellent compliance with treatment. He is not having side effects.  He is following a Regular diet. He is not exercising. He does not smoke.  Use of agents associated with hypertension: none.   Outside blood pressures are not checked. Symptoms: No chest pain No chest pressure  No palpitations No syncope  No dyspnea No orthopnea  No paroxysmal nocturnal dyspnea No lower extremity edema   Pertinent labs: Lab Results  Component Value Date   CHOL 118 10/28/2020   HDL 38 (L) 10/28/2020   LDLCALC 65 10/28/2020   TRIG 74 10/28/2020   CHOLHDL 3.1 10/28/2020   Lab Results  Component Value Date   NA 140 10/28/2020   K 4.5 10/28/2020   CREATININE 1.04 10/28/2020   GFRNONAA 69 08/01/2020   GFRAA 80 08/01/2020   GLUCOSE 120 (H) 10/28/2020     The ASCVD Risk score Mikey Bussing DC Jr., et al., 2013) failed to calculate for the following reasons:   The valid total cholesterol range is 130 to 320 mg/dL    ---------------------------------------------------------------------------------------------------  Lipid/Cholesterol, Follow-up  Last lipid panel Other pertinent labs  Lab Results  Component Value Date   CHOL 118 10/28/2020   HDL 38 (L) 10/28/2020   LDLCALC 65 10/28/2020   TRIG 74 10/28/2020   CHOLHDL 3.1 10/28/2020   Lab Results  Component Value Date   ALT 9 10/28/2020   AST 13 10/28/2020   PLT 260 10/28/2020   TSH 2.71 08/26/2017     He was last seen for this 3 months ago.  Management since that visit includes continue lipitor 40 mg QHS.  He reports excellent compliance with treatment. He is not having side effects.   Symptoms: No chest pain No chest pressure/discomfort  No dyspnea No lower extremity edema  No numbness or tingling of extremity No orthopnea  No palpitations No paroxysmal nocturnal dyspnea  No speech difficulty No syncope   Current diet: well balanced Current exercise: aerobics  The ASCVD Risk score Mikey Bussing DC Jr., et al., 2013) failed to calculate for the following reasons:   The valid total cholesterol range is 130 to 320 mg/dL  --------------------------------------------------------------------------------------------------- Diabetes Mellitus Type II, Follow-up  Lab Results  Component Value Date   HGBA1C 7.4 (A) 10/28/2020   HGBA1C 7.4 (H) 08/01/2020   HGBA1C  7.6 (H) 04/29/2020   Wt Readings from Last 3 Encounters:  10/28/20 276 lb 1.6 oz (125.2 kg)  08/01/20 275 lb 4.8 oz (124.9 kg)  04/29/20 280 lb 11.2 oz (127.3 kg)   Last seen for diabetes 6 months ago.  Management since then includes jardiance 25 mg daily, basaglar 60 units QHS. He reports good compliance with treatment. He is not having side effects.  Symptoms: No fatigue No foot ulcerations  No appetite changes No nausea  No paresthesia of the feet  No polydipsia  No polyuria No visual disturbances   No vomiting     Home blood sugar records: cannot recall  Episodes of  hypoglycemia? No    Current insulin regiment: basaglar 60 units WHS Most Recent Eye Exam: 10/2020 Current exercise: aerobics Current diet habits: well balanced  Pertinent Labs: Lab Results  Component Value Date   CHOL 118 10/28/2020   HDL 38 (L) 10/28/2020   LDLCALC 65 10/28/2020   TRIG 74 10/28/2020   CHOLHDL 3.1 10/28/2020   Lab Results  Component Value Date   NA 140 10/28/2020   K 4.5 10/28/2020   CREATININE 1.04 10/28/2020   GFRNONAA 69 08/01/2020   GFRAA 80 08/01/2020   GLUCOSE 120 (H) 10/28/2020     ---------------------------------------------------------------------------------------------------      Medications: Outpatient Medications Prior to Visit  Medication Sig  . acetaminophen (TYLENOL) 500 MG tablet Take 500 mg by mouth every 6 (six) hours as needed.  Marland Kitchen amLODipine (NORVASC) 10 MG tablet TAKE 1 TABLET BY MOUTH EVERY DAY  . atorvastatin (LIPITOR) 40 MG tablet TAKE 1 TABLET BY MOUTH EVERYDAY AT BEDTIME  . cholecalciferol (VITAMIN D) 1000 units tablet Take 1,000 Units by mouth 2 (two) times daily.   . empagliflozin (JARDIANCE) 25 MG TABS tablet TAKE 1 TABLET BY MOUTH DAILY. (HIGHER DOSE)  . fluticasone (FLONASE) 50 MCG/ACT nasal spray Place 2 sprays into both nostrils daily.  Marland Kitchen HUMALOG KWIKPEN 100 UNIT/ML KwikPen INJECT 10 UNITS TOTAL INTO THE SKIN 3 (THREE) TIMES DAILY.  Marland Kitchen Insulin Glargine (BASAGLAR KWIKPEN) 100 UNIT/ML Inject 60 Units into the skin at bedtime. [employer]  . [DISCONTINUED] losartan (COZAAR) 100 MG tablet TAKE 1 TABLET BY MOUTH EVERY DAY  . Blood Glucose Monitoring Suppl (ONE TOUCH ULTRA 2) w/Device KIT Check fingerstick blood sugars three times a day; DX E11.65, LON 99 months (Patient not taking: Reported on 08/01/2020)  . glucose blood (ONE TOUCH ULTRA TEST) test strip CHECK FINGERSTICK BLOOD SUGARS THREE TIMES A DAY, LON 99 MONTHS, for ICD-10 E11.65, E11.21, Z79.4  . Lancets (ONETOUCH ULTRASOFT) lancets Check fingerstick blood sugar three  times a day; For IDDM, uncontrolled, ICD-10 E11.65, E11.21, Z79.4, LON 99 months  . [DISCONTINUED] B-D UF III MINI PEN NEEDLES 31G X 5 MM MISC USE ONCE A DAY WITH LONG-ACTING INSULIN, AND UP TO 3X A DAY WITH SHORT-ACTING INSULIN (Patient not taking: Reported on 08/01/2020)  . [DISCONTINUED] levocetirizine (XYZAL) 5 MG tablet TAKE 1 TABLET BY MOUTH EVERY DAY IN THE EVENING (Patient not taking: Reported on 10/28/2020)   No facility-administered medications prior to visit.    Review of Systems     Objective    BP 118/78   Pulse 90   Temp 98 F (36.7 C)   Resp 16   Ht 6' 1"  (1.854 m)   Wt 276 lb 1.6 oz (125.2 kg)   SpO2 96%   BMI 36.43 kg/m     Physical Exam Constitutional:      Appearance: Normal  appearance.  Cardiovascular:     Rate and Rhythm: Normal rate and regular rhythm.     Heart sounds: Normal heart sounds.  Pulmonary:     Effort: Pulmonary effort is normal.     Breath sounds: Normal breath sounds.  Skin:    General: Skin is warm and dry.  Neurological:     Mental Status: He is alert and oriented to person, place, and time. Mental status is at baseline.  Psychiatric:        Mood and Affect: Mood normal.        Behavior: Behavior normal.       Results for orders placed or performed in visit on 10/28/20  Comprehensive Metabolic Panel (CMET)  Result Value Ref Range   Glucose, Bld 120 (H) 65 - 99 mg/dL   BUN 14 7 - 25 mg/dL   Creat 1.04 0.70 - 1.18 mg/dL   BUN/Creatinine Ratio NOT APPLICABLE 6 - 22 (calc)   Sodium 140 135 - 146 mmol/L   Potassium 4.5 3.5 - 5.3 mmol/L   Chloride 106 98 - 110 mmol/L   CO2 25 20 - 32 mmol/L   Calcium 10.0 8.6 - 10.3 mg/dL   Total Protein 7.4 6.1 - 8.1 g/dL   Albumin 4.0 3.6 - 5.1 g/dL   Globulin 3.4 1.9 - 3.7 g/dL (calc)   AG Ratio 1.2 1.0 - 2.5 (calc)   Total Bilirubin 0.6 0.2 - 1.2 mg/dL   Alkaline phosphatase (APISO) 63 35 - 144 U/L   AST 13 10 - 35 U/L   ALT 9 9 - 46 U/L  Lipid Profile  Result Value Ref Range    Cholesterol 118 <200 mg/dL   HDL 38 (L) > OR = 40 mg/dL   Triglycerides 74 <150 mg/dL   LDL Cholesterol (Calc) 65 mg/dL (calc)   Total CHOL/HDL Ratio 3.1 <5.0 (calc)   Non-HDL Cholesterol (Calc) 80 <130 mg/dL (calc)  CBC with Differential  Result Value Ref Range   WBC 6.2 3.8 - 10.8 Thousand/uL   RBC 5.43 4.20 - 5.80 Million/uL   Hemoglobin 15.4 13.2 - 17.1 g/dL   HCT 47.2 38.5 - 50.0 %   MCV 86.9 80.0 - 100.0 fL   MCH 28.4 27.0 - 33.0 pg   MCHC 32.6 32.0 - 36.0 g/dL   RDW 13.7 11.0 - 15.0 %   Platelets 260 140 - 400 Thousand/uL   MPV 9.7 7.5 - 12.5 fL   Neutro Abs 3,305 1,500 - 7,800 cells/uL   Lymphs Abs 2,052 850 - 3,900 cells/uL   Absolute Monocytes 583 200 - 950 cells/uL   Eosinophils Absolute 192 15 - 500 cells/uL   Basophils Absolute 68 0 - 200 cells/uL   Neutrophils Relative % 53.3 %   Total Lymphocyte 33.1 %   Monocytes Relative 9.4 %   Eosinophils Relative 3.1 %   Basophils Relative 1.1 %  POCT HgB A1C  Result Value Ref Range   Hemoglobin A1C 7.4 (A) 4.0 - 5.6 %   HbA1c POC (<> result, manual entry)     HbA1c, POC (prediabetic range)     HbA1c, POC (controlled diabetic range)      Assessment & Plan    1. Type 2 diabetes mellitus with diabetic nephropathy, 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HCC)  Continue current medications.   - POCT HgB A1C - Comprehensive Metabolic Panel (CMET) - Lipid Profile - CBC with Differential - Insulin Pen Needle (B-D UF III MINI PEN NEEDLES) 31G X 5  MM MISC; Use four a day to inject insulin.  Dispense: 300 each; Refill: 3  2. Mixed hyperlipidemia   3. Morbid obesity ([place])   4. Essential hypertension, benign  Continue losartan and amlodipine.   - losartan (COZAAR) 100 MG tablet; Take 1 tablet (100 mg total) by mouth daily.  Dispense: 90 tablet; Refill: 1   No follow-ups on file.         Trinna Post, PA-C  [facility] [phone] (phone) [phone] (fax)  [place]

## 2020-11-03 NOTE — Progress Notes (Signed)
Patient needs to make a appointment with a provider to discuss his desired med and treatment changes

## 2020-11-12 ENCOUNTER — Other Ambulatory Visit: Payer: Self-pay

## 2020-11-12 ENCOUNTER — Emergency Department: Payer: Medicare PPO

## 2020-11-12 ENCOUNTER — Emergency Department
Admission: EM | Admit: 2020-11-12 | Discharge: 2020-11-12 | Disposition: A | Discharge status: Home / Self Care | Payer: Medicare PPO | Attending: Emergency Medicine | Admitting: Emergency Medicine

## 2020-11-12 DIAGNOSIS — I2510 Atherosclerotic heart disease of native coronary artery without angina pectoris: Secondary | ICD-10-CM | POA: Diagnosis not present

## 2020-11-12 DIAGNOSIS — I10 Essential (primary) hypertension: Secondary | ICD-10-CM | POA: Diagnosis not present

## 2020-11-12 DIAGNOSIS — E1121 Type 2 diabetes mellitus with diabetic nephropathy: Secondary | ICD-10-CM | POA: Insufficient documentation

## 2020-11-12 DIAGNOSIS — R519 Headache, unspecified: Secondary | ICD-10-CM | POA: Diagnosis not present

## 2020-11-12 DIAGNOSIS — Z794 Long term (current) use of insulin: Secondary | ICD-10-CM | POA: Diagnosis not present

## 2020-11-12 DIAGNOSIS — Z96642 Presence of left artificial hip joint: Secondary | ICD-10-CM | POA: Insufficient documentation

## 2020-11-12 DIAGNOSIS — Z79899 Other long term (current) drug therapy: Secondary | ICD-10-CM | POA: Insufficient documentation

## 2020-11-12 DIAGNOSIS — Z87891 Personal history of nicotine dependence: Secondary | ICD-10-CM | POA: Insufficient documentation

## 2020-11-12 DIAGNOSIS — Z7984 Long term (current) use of oral hypoglycemic drugs: Secondary | ICD-10-CM | POA: Insufficient documentation

## 2020-11-12 LAB — CBC WITH DIFFERENTIAL/PLATELET
Abs Immature Granulocytes: 0.01 10*3/uL (ref 0.00–0.07)
Basophils Absolute: 0.1 10*3/uL (ref 0.0–0.1)
Basophils Relative: 1 %
Eosinophils Absolute: 0.2 10*3/uL (ref 0.0–0.5)
Eosinophils Relative: 4 %
HCT: 45.8 % (ref 39.0–52.0)
Hemoglobin: 15.4 g/dL (ref 13.0–17.0)
Immature Granulocytes: 0 %
Lymphocytes Relative: 32 %
Lymphs Abs: 1.9 10*3/uL (ref 0.7–4.0)
MCH: 28.9 pg (ref 26.0–34.0)
MCHC: 33.6 g/dL (ref 30.0–36.0)
MCV: 86.1 fL (ref 80.0–100.0)
Monocytes Absolute: 0.5 10*3/uL (ref 0.1–1.0)
Monocytes Relative: 9 %
Neutro Abs: 3.3 10*3/uL (ref 1.7–7.7)
Neutrophils Relative %: 54 %
Platelets: 255 10*3/uL (ref 150–400)
RBC: 5.32 MIL/uL (ref 4.22–5.81)
RDW: 15 % (ref 11.5–15.5)
WBC: 5.9 10*3/uL (ref 4.0–10.5)
nRBC: 0 % (ref 0.0–0.2)

## 2020-11-12 LAB — BASIC METABOLIC PANEL
Anion gap: 6 (ref 5–15)
BUN: 13 mg/dL (ref 8–23)
CO2: 25 mmol/L (ref 22–32)
Calcium: 9.6 mg/dL (ref 8.9–10.3)
Chloride: 110 mmol/L (ref 98–111)
Creatinine, Ser: 0.9 mg/dL (ref 0.61–1.24)
GFR, Estimated: >60 mL/min (ref >60)
Glucose, Bld: 76 mg/dL (ref 70–99)
Potassium: 3.9 mmol/L (ref 3.5–5.1)
Sodium: 141 mmol/L (ref 135–145)

## 2020-11-12 LAB — SEDIMENTATION RATE: Sed Rate: 10 mm/hr (ref 0–20)

## 2020-11-12 LAB — TROPONIN I (HIGH SENSITIVITY): Troponin I (High Sensitivity): 6 ng/L (ref <18)

## 2020-11-12 MED ORDER — BUTALBITAL-APAP-CAFFEINE 50-325-40 MG PO TABS
2.0000 | ORAL_TABLET | Freq: Once | ORAL | Status: AC
  Administered 2020-11-12: 2 via ORAL
  Filled 2020-11-12: qty 2

## 2020-11-12 MED ORDER — BUTALBITAL-APAP-CAFFEINE 50-325-40 MG PO TABS: 1.0000 | ORAL_TABLET | Freq: Four times a day (QID) | ORAL | 0 refills | Status: AC | PRN

## 2020-11-12 NOTE — ED Notes (Signed)
Patient transported to CT 

## 2020-11-12 NOTE — ED Notes (Signed)
Patient is resting comfortably. Call light in reach. Fall precautions in place.  

## 2020-11-12 NOTE — Discharge Instructions (Signed)
As we discussed, please follow-up with your primary care doctor to discuss your headaches.  You are being discharged with a prescription for Fioricet to use for headaches moving forward.  Take 1-2 tablets at a time, every 6 hours as needed. Do not take plain Tylenol with this medication.  This medicine includes Tylenol and you do not want to mix to take too much  If you develop any worsening headache despite the medication, headaches with passing out, headaches with fever, please return to the ED.

## 2020-11-12 NOTE — ED Provider Notes (Signed)
[facility] Emergency Department Provider Note ____________________________________________   Event Date/Time   First MD Initiated Contact with Patient 11/12/20 1058     (approximate)  I have reviewed the triage vital signs and the nursing notes.  HISTORY  Chief Complaint Headache   HPI Edgar [name] is a 75 y.o. malewho presents to the ED for evaluation of headache.   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  Remote smoking history.  Obesity Of note, patient reports his wife just died in the beginning of Nov 27, 2022, 1 month ago.  Lives at home with his daughters who help care for him and provide him 3 times daily meals.  Denies suicidality.  Reports appropriate tearfulness.  Patient presents to the ED for evaluation of 2 weeks of intermittent right-sided headache.  Patient reports up to 10 episodes of headache per day with a similar syndrome, always affecting his right side of his head, around his temple.  Denies traumas, falls, gait changes or injury.  He reports the pain will last a matter of minutes before self resolving.  Denies maximal intensity at the beginning and denies coexisting lacrimation or respiratory secretions/congestion.  Denies vision changes, denies auditory changes or discharge.  Denies syncope.  Reports using Tylenol at home with improvement of the symptoms and resolution of his headache, but reports he feels like he is using a lot of Tylenol and wants to make sure there is no actual problem.  He presents at the urging of his daughter.  He reports no pain right now and feeling well.  Past Medical History:  Diagnosis Date  . Aneurysm of aorta ([place]) 09/11/2016   Noted on CT scan Sept 2017  . 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 Closed intertrochanteric fracture of left hip, sequela 05/30/2016  . Coronary artery disease 10/25/2017   CT scan 26-Nov-2017  . Diabetes  mellitus without complication ([place])   . Essential hypertension, benign 12/23/2015  . History of colonoscopy with polypectomy 12/07/2016   Polypectomy x 12 per patient  . Hx of tobacco use, presenting hazards to health 12/23/2015   Quit prior to 1997  . Hx of tobacco use, presenting hazards to health 12/23/2015   Quit prior to 1997   . Hyperlipidemia 12/23/2015  . Hypertension   . 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[place]) 05/14/2017  . Obesity 12/23/2015  . Pulmonary embolism ([place]) 05/18/2016   Hospitalization Sept 2017, following hip fracture    Patient Active Problem List   Diagnosis Date Noted  . Rhinitis 08/01/2020  . Aortic atherosclerosis ([place]) 08/01/2020  . Coronary artery disease 10/25/2017  . 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[place]) 05/14/2017  . History of colonoscopy with polypectomy 12/07/2016  . Fatty liver 09/11/2016  . Aneurysm of aorta ([place]) 09/11/2016  . 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 Tinea pedis 06/25/2016  . Hx of pulmonary embolus 05/18/2016  . Essential hypertension, benign 12/23/2015  . Hyperlipidemia 12/23/2015  . Morbid obesity ([place]) 12/23/2015  . Type 2 diabetes mellitus with diabetic nephropathy, 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[place]) 01/25/2015    Past Surgical History:  Procedure Laterality Date  . COLONOSCOPY WITH PROPOFOL N/A 01/15/2017   Procedure: COLONOSCOPY WITH PROPOFOL;  Surgeon: Jonathon Bellows, MD;  Location: [facility] ENDOSCOPY;  Service: Endoscopy;  Laterality: N/A;  . COLONOSCOPY WITH PROPOFOL N/A 12/17/2019   Procedure: COLONOSCOPY WITH  PROPOFOL;  Surgeon: Jonathon Bellows, MD;  Location: [facility] ENDOSCOPY;  Service: Gastroenterology;  Laterality: N/A;  . EYE SURGERY    . FRACTURE SURGERY    . INTRAMEDULLARY (IM) NAIL INTERTROCHANTERIC Left 04/14/2016   Procedure: INTRAMEDULLARY (IM) NAIL INTERTROCHANTRIC;  Surgeon: Corky Mull, MD;  Location: [facility] ORS;  Service: Orthopedics;   Laterality: Left;  . JOINT REPLACEMENT Left 04/2016  . TOTAL HIP ARTHROPLASTY      Prior to Admission medications   Medication Sig Start Date End Date Taking? Authorizing Provider  butalbital-acetaminophen-caffeine (FIORICET) 50-325-40 MG tablet Take 1-2 tablets by mouth every 6 (six) hours as needed for headache. 11/12/20 11/12/21 Yes Vladimir Crofts, MD  acetaminophen (TYLENOL) 500 MG tablet Take 500 mg by mouth every 6 (six) hours as needed.    [provider]  amLODipine (NORVASC) 10 MG tablet TAKE 1 TABLET BY MOUTH EVERY DAY 09/19/20   Delsa Grana, PA-C  atorvastatin (LIPITOR) 40 MG tablet TAKE 1 TABLET BY MOUTH EVERYDAY AT BEDTIME 04/25/20   Delsa Grana, PA-C  Blood Glucose Monitoring Suppl (ONE TOUCH ULTRA 2) w/Device KIT Check fingerstick blood sugars three times a day; DX E11.65, LON 99 months Patient not taking: Reported on 08/01/2020 07/04/17   Arnetha Courser, MD  cholecalciferol (VITAMIN D) 1000 units tablet Take 1,000 Units by mouth 2 (two) times daily.     [provider]  empagliflozin (JARDIANCE) 25 MG TABS tablet TAKE 1 TABLET BY MOUTH DAILY. (HIGHER DOSE) 01/11/20   Delsa Grana, PA-C  fluticasone (FLONASE) 50 MCG/ACT nasal spray Place 2 sprays into both nostrils daily. 10/15/19   Delsa Grana, PA-C  glucose blood (ONE TOUCH ULTRA TEST) test strip CHECK FINGERSTICK BLOOD SUGARS THREE TIMES A DAY, LON 99 MONTHS, for ICD-10 E11.65, E11.21, Z79.4 08/01/20   Delsa Grana, PA-C  HUMALOG KWIKPEN 100 UNIT/ML KwikPen INJECT 10 UNITS TOTAL INTO THE SKIN 3 (THREE) TIMES DAILY. 10/22/19   Delsa Grana, PA-C  Insulin Glargine (BASAGLAR KWIKPEN) 100 UNIT/ML Inject 60 Units into the skin at bedtime. [employer] Cares 06/23/20   Delsa Grana, PA-C  Insulin Pen Needle (B-D UF III MINI PEN NEEDLES) 31G X 5 MM MISC Use four a day to inject insulin. 10/28/20   Trinna Post, PA-C  Lancets [facility] ULTRASOFT) lancets Check fingerstick blood sugar three times a day; For IDDM, uncontrolled,  ICD-10 E11.65, E11.21, Z79.4, LON 99 months 08/01/20   Delsa Grana, PA-C  losartan (COZAAR) 100 MG tablet Take 1 tablet (100 mg total) by mouth daily. 10/28/20   Trinna Post, PA-C    Allergies Patient has no known allergies.  Family History  Problem Relation Age of Onset  . Cancer Mother   . Diabetes Mother   . Heart disease Father   . Alcohol abuse Brother     Social History Social History   Tobacco Use  . Smoking status: Former Smoker    Types: Cigarettes    Quit date: 09/01/1978    Years since quitting: 42.2  . Smokeless tobacco: Never Used  Vaping Use  . Vaping Use: Never used  Substance Use Topics  . Alcohol use: No    Alcohol/week: 0.0 standard drinks  . Drug use: No    Review of Systems  Constitutional: No fever/chills Eyes: No visual changes. ENT: No sore throat. Cardiovascular: Denies chest pain. Respiratory: Denies shortness of breath. Gastrointestinal: No abdominal pain.  No nausea, no vomiting.  No diarrhea.  No constipation. Genitourinary: Negative for dysuria. Musculoskeletal: Negative for back pain. Skin:  Negative for rash. Neurological: Negative for focal weakness or numbness.  Positive for headache  ____________________________________________   PHYSICAL EXAM:  VITAL SIGNS: Vitals:   11/12/20 1230 11/12/20 1400  BP: (!) 139/110 116/66  Pulse: 62 (!) 56  Resp: (!) 23 19  Temp:    SpO2: 99% 92%    Constitutional: Alert and oriented. Well appearing and in no acute distress.  Hard of hearing. Eyes: Conjunctivae are normal. PERRL. EOMI. Head: Atraumatic.  Mild tenderness of the right temporal area Nose: No congestion/rhinnorhea. Mouth/Throat: Mucous membranes are moist.  Oropharynx non-erythematous. Neck: No stridor. No cervical spine tenderness to palpation. Cardiovascular: Normal rate, regular rhythm. Grossly normal heart sounds.  Good peripheral circulation. Respiratory: Normal respiratory effort.  No retractions. Lungs  CTAB. Gastrointestinal: Soft , nondistended, nontender to palpation. No CVA tenderness. Musculoskeletal: No lower extremity tenderness nor edema.  No joint effusions. No signs of acute trauma. Neurologic:  Normal speech and language. No gross focal neurologic deficits are appreciated. No gait instability noted. Cranial nerves II through XII intact 5/5 strength and sensation in all 4 extremities Skin:  Skin is warm, dry and intact. No rash noted. Psychiatric: Mood and affect are normal. Speech and behavior are normal. ____________________________________________   LABS (all labs ordered are listed, but only abnormal results are displayed)  Labs Reviewed  CBC WITH DIFFERENTIAL/PLATELET  BASIC METABOLIC PANEL  SEDIMENTATION RATE  TROPONIN I (HIGH SENSITIVITY)  TROPONIN I (HIGH SENSITIVITY)   ____________________________________________  12 Lead EKG  Sinus rhythm, rate of 57 bpm.  Leftward axis.  Incomplete right bundle and otherwise normal intervals.  No evidence of acute ischemia. ____________________________________________  RADIOLOGY  ED MD interpretation: CT head reviewed by me without evidence of acute intracranial pathology.  Official radiology report(s): CT Head Wo Contrast  Result Date: 11/12/2020 CLINICAL DATA:  Altered mental status and headache EXAM: CT HEAD WITHOUT CONTRAST TECHNIQUE: Contiguous axial images were obtained from the base of the skull through the vertex without intravenous contrast. COMPARISON:  April 13, 2016 FINDINGS: Brain: There is stable age related volume loss. There is no intracranial mass, hemorrhage, extra-axial fluid collection, or midline shift. Slight small vessel disease in the centra semiovale bilaterally is stable. No evident acute appearing infarct. Vascular: No hyperdense vessel. Foci of calcification noted in each carotid siphon region. Skull: Bony calvarium appears intact. Benign appearing falcine calcification is stable. Sinuses/Orbits:  Mucosal thickening with apparent air-fluid level in the left maxillary antrum. There is mucosal thickening in several ethmoid air cells as well as in the right frontal sinus region. Orbits appear symmetric bilaterally except for evidence of cataract removal on the left. Other: Visualized mastoid air cells clear. IMPRESSION: Slight periventricular small vessel disease. No acute infarct. No mass or hemorrhage. There are foci of arterial vascular calcification. Foci of paranasal sinus disease at several sites. Apparent air-fluid level in the left maxillary antrum.4 Electronically Signed   By: Lowella Grip III M.D.   On: 11/12/2020 14:24    ____________________________________________   PROCEDURES and INTERVENTIONS  Procedure(s) performed (including Critical Care):  .1-3 Lead EKG Interpretation Performed by: Vladimir Crofts, MD Authorized by: Vladimir Crofts, MD     Interpretation: normal     ECG rate:  70   ECG rate assessment: normal     Rhythm: sinus rhythm     Ectopy: none     Conduction: normal      Medications  butalbital-acetaminophen-caffeine (FIORICET) 50-325-40 MG per tablet 2 tablet (2 tablets Oral Given 11/12/20 1300)  ____________________________________________   MDM / ED COURSE   Pleasant 75 year old male presents to the ED with intermittent headaches without evidence of acute pathology and amenable to outpatient management.  Normal vitals on room air.  Exam without evidence of acute pathology.  No neurologic or vascular deficits, no distress or signs of trauma.  He looks well.  Blood work is unremarkable without evidence of acute pathology.  No elevation of ESR to suggest temporal arteritis, CT head demonstrates no evidence of ICH or acute CVA.  His headaches at home have been resolved with Tylenol and he has had no breakthrough headaches.  He is still requesting a more powerful analgesics, so I provide him Fioricet prescription with recommendations to follow-up with his  PCP and we discussed return precautions for the ED.  Patient stable for outpatient management.  Clinical Course as of 11/12/20 1516  Sat Nov 12, 2020  1248 Reassessed.  Daughter now at the bedside.  We discussed reassuring blood work.  We discussed CT imaging.  Daughter and patient are agreeable.  Patient reports he is feeling a mild headache and we discussed Fioricet, he is agreeable [DS]  1249 Nurses grabbing the patient something to eat because he is hungry. [DS]  0160 Reassessed.  Patient with resolution of headache.  Has tolerated p.o. intake without complication.  He is requesting discharge with prescription for Fioricet. [DS]    Clinical Course User Index [DS] Vladimir Crofts, MD    ____________________________________________   FINAL CLINICAL IMPRESSION(S) / ED DIAGNOSES  Final diagnoses:  Headache disorder     ED Discharge Orders         Ordered    butalbital-acetaminophen-caffeine (FIORICET) 50-325-40 MG tablet  Every 6 hours PRN        11/12/20 1433           Dylan Smith   Note:  This document was prepared using Dragon voice recognition software and may include unintentional dictation errors.   Vladimir Crofts, MD 11/12/20 [phone]

## 2020-11-12 NOTE — ED Triage Notes (Signed)
Pt via POV from home. Pt c/o L sided headache for the past 2 weeks intermittently. Denies any other symptoms. Denies CP/SOB. Pt is A&Ox4 and NAD.

## 2020-11-16 ENCOUNTER — Ambulatory Visit (INDEPENDENT_AMBULATORY_CARE_PROVIDER_SITE_OTHER): Payer: Medicare PPO | Admitting: Physician Assistant

## 2020-11-16 ENCOUNTER — Encounter: Payer: Self-pay | Admitting: Physician Assistant

## 2020-11-16 ENCOUNTER — Other Ambulatory Visit: Payer: Self-pay

## 2020-11-16 VITALS — BP 124/82 | HR 81 | Temp 98.2°F | Resp 16 | Ht 73.0 in | Wt 276.1 lb

## 2020-11-16 DIAGNOSIS — R519 Headache, unspecified: Secondary | ICD-10-CM | POA: Diagnosis not present

## 2020-11-16 NOTE — Progress Notes (Signed)
Established patient visit   Patient: Edgar [name]   DOB: 29-Jul-1946   75 y.o. Male  MRN: [id number] Visit Date: 11/16/2020  Today's healthcare provider: Trinna Post, PA-C   Chief Complaint  Patient presents with  . Medication Problem    Discuss medication   Subjective    HPI HPI    Medication Problem    Comments: Discuss medication       Last edited by Hollie Salk, [place] on 11/16/2020  8:52 AM. (History)      Patient presents today with questions about his medications. He was seen in the ER on 11/12/2020. He was seen for a headache. He underwent a head CT with the following results:  FINDINGS: Brain: There is stable age related volume loss. There is no intracranial mass, hemorrhage, extra-axial fluid collection, or midline shift. Slight small vessel disease in the centra semiovale bilaterally is stable. No evident acute appearing infarct.  Vascular: No hyperdense vessel. Foci of calcification noted in each carotid siphon region.  Skull: Bony calvarium appears intact. Benign appearing falcine calcification is stable.  Sinuses/Orbits: Mucosal thickening with apparent air-fluid level in the left maxillary antrum. There is mucosal thickening in several ethmoid air cells as well as in the right frontal sinus region. Orbits appear symmetric bilaterally except for evidence of cataract removal on the left.  Other: Visualized mastoid air cells clear.  IMPRESSION: Slight periventricular small vessel disease. No acute infarct. No mass or hemorrhage.  There are foci of arterial vascular calcification.  Foci of paranasal sinus disease at several sites. Apparent air-fluid level in the left maxillary antrum.  Patient denies headache right now but can have intermittent headaches. Not taking allergy medications.   ---------------------------------------------------------------------------------------------------      Medications: Outpatient Medications  Prior to Visit  Medication Sig  . acetaminophen (TYLENOL) 500 MG tablet Take 500 mg by mouth every 6 (six) hours as needed.  Marland Kitchen amLODipine (NORVASC) 10 MG tablet TAKE 1 TABLET BY MOUTH EVERY DAY  . atorvastatin (LIPITOR) 40 MG tablet TAKE 1 TABLET BY MOUTH EVERYDAY AT BEDTIME  . butalbital-acetaminophen-caffeine (FIORICET) 50-325-40 MG tablet Take 1-2 tablets by mouth every 6 (six) hours as needed for headache.  . cholecalciferol (VITAMIN D) 1000 units tablet Take 1,000 Units by mouth 2 (two) times daily.   . empagliflozin (JARDIANCE) 25 MG TABS tablet TAKE 1 TABLET BY MOUTH DAILY. (HIGHER DOSE)  . fluticasone (FLONASE) 50 MCG/ACT nasal spray Place 2 sprays into both nostrils daily.  Marland Kitchen HUMALOG KWIKPEN 100 UNIT/ML KwikPen INJECT 10 UNITS TOTAL INTO THE SKIN 3 (THREE) TIMES DAILY.  Marland Kitchen Insulin Glargine (BASAGLAR KWIKPEN) 100 UNIT/ML Inject 60 Units into the skin at bedtime. [employer]  . losartan (COZAAR) 100 MG tablet Take 1 tablet (100 mg total) by mouth daily.  . Blood Glucose Monitoring Suppl (ONE TOUCH ULTRA 2) w/Device KIT Check fingerstick blood sugars three times a day; DX E11.65, LON 99 months (Patient not taking: No sig reported)  . glucose blood (ONE TOUCH ULTRA TEST) test strip CHECK FINGERSTICK BLOOD SUGARS THREE TIMES A DAY, LON 99 MONTHS, for ICD-10 E11.65, E11.21, Z79.4 (Patient not taking: Reported on 11/16/2020)  . Insulin Pen Needle (B-D UF III MINI PEN NEEDLES) 31G X 5 MM MISC Use four a day to inject insulin. (Patient not taking: Reported on 11/16/2020)  . Lancets (ONETOUCH ULTRASOFT) lancets Check fingerstick blood sugar three times a day; For IDDM, uncontrolled, ICD-10 E11.65, E11.21, Z79.4, LON 99 months (Patient not taking:  Reported on 11/16/2020)   No facility-administered medications prior to visit.    Review of Systems  All other systems reviewed and are negative.      Objective    BP 124/82   Pulse 81   Temp 98.2 F (36.8 C) (Oral)   Resp 16   Ht 6' 1"  (1.854 m)    Wt 276 lb 1.6 oz (125.2 kg)   SpO2 96%   BMI 36.43 kg/m     Physical Exam Constitutional:      Appearance: Normal appearance.  Cardiovascular:     Rate and Rhythm: Normal rate.  Pulmonary:     Effort: Pulmonary effort is normal.  Skin:    General: Skin is warm and dry.  Neurological:     General: No focal deficit present.     Mental Status: He is alert and oriented to person, place, and time.       No results found for any visits on 11/16/20.  Assessment & Plan    1. Nonintractable headache, unspecified chronicity pattern, unspecified headache type  Discussed medication changes. May consider 2nd generation antihistamine to help with sinus inflammation.    Return if symptoms worsen or fail to improve.      I spent 20 minutes dedicated to the care of this patient on the date of this encounter to include pre-visit review of records, face-to-face time with the patient discussing headaches, and post visit ordering of testing.    Trinna Post, PA-C  [facility] [phone] (phone) [phone] (fax)  [place]

## 2020-11-22 ENCOUNTER — Telehealth: Payer: Self-pay | Admitting: Family Medicine

## 2020-11-22 ENCOUNTER — Ambulatory Visit: Payer: Medicare PPO

## 2020-11-22 NOTE — Telephone Encounter (Signed)
Copied from [place] [phone]. Topic: Medicare AWV >> Nov 22, 2020  8:15 AM Cher Nakai R wrote: Reason for CRM:  Left message re: AWVS scheduled for November 22, 2020 has been reschedule to December 01, 2020  at 2:20 due to [facility] out sick. This will be by phone

## 2020-11-28 ENCOUNTER — Encounter: Payer: Self-pay | Admitting: Podiatry

## 2020-11-28 ENCOUNTER — Ambulatory Visit: Payer: Medicare PPO | Admitting: Podiatry

## 2020-11-28 ENCOUNTER — Other Ambulatory Visit: Payer: Self-pay

## 2020-11-28 DIAGNOSIS — B351 Tinea unguium: Secondary | ICD-10-CM | POA: Diagnosis not present

## 2020-11-28 DIAGNOSIS — M79676 Pain in unspecified toe(s): Secondary | ICD-10-CM | POA: Diagnosis not present

## 2020-11-28 DIAGNOSIS — E0843 Diabetes mellitus due to underlying condition with diabetic autonomic (poly)neuropathy: Secondary | ICD-10-CM | POA: Diagnosis not present

## 2020-11-28 NOTE — Progress Notes (Signed)
This patient returns to my office for at risk foot care.  This patient requires this care by a professional since this patient will be at risk due to having  Diabetes with diabetic nephropathy.  This patient is unable to cut nails himself since the patient cannot reach his nails.These nails are painful walking and wearing shoes.  This patient presents for at risk foot care today.  General Appearance  Alert, conversant and in no acute stress.  Vascular  Dorsalis pedis and posterior tibial  pulses are palpable  bilaterally.  Capillary return is within normal limits  bilaterally. Temperature is within normal limits  bilaterally.  Neurologic  Senn-Weinstein monofilament wire test within normal limits  bilaterally. Muscle power within normal limits bilaterally.  Nails Thick disfigured discolored nails with subungual debris  from hallux to fifth toes bilaterally. No evidence of bacterial infection or drainage bilaterally.  Orthopedic  No limitations of motion  feet .  No crepitus or effusions noted.  No bony pathology or digital deformities noted.  Skin  normotropic skin with no porokeratosis noted bilaterally.  No signs of infections or ulcers noted.     Onychomycosis  Pain in right toes  Pain in left toes  Consent was obtained for treatment procedures.   Mechanical debridement of nails 1-5  bilaterally performed with a nail nipper.  Filed with dremel without incident.    Return office visit   3 months                   Told patient to return for periodic foot care and evaluation due to potential at risk complications.   Seher Schlagel DPM  

## 2020-12-01 ENCOUNTER — Ambulatory Visit: Payer: Medicare PPO

## 2020-12-07 ENCOUNTER — Telehealth: Payer: Self-pay

## 2020-12-07 NOTE — Progress Notes (Signed)
Spoke to patient to confirmed patient telephone appointment on 12/08/2020 for CCM at 1:00 pm with [name] the Clinical pharmacist.   Patient states he needs to cancel his appointment because he is working now.Patient states he is unsure when he can reschedule.Patient states his wife recently pass away on 10/04/2020, and he is unsure how he is going to pay his bills.Patient states he will try his best to reschedule but is on sure at time when it will.  [place] Pharmacist Assistant [phone]

## 2020-12-08 ENCOUNTER — Telehealth: Payer: Self-pay

## 2020-12-12 ENCOUNTER — Telehealth: Payer: Self-pay | Admitting: *Deleted

## 2020-12-12 NOTE — Chronic Care Management (AMB) (Signed)
  Care Management   Note  12/12/2020 Name: Edgar [name] MRN: [id number] DOB: 09-12-1945  [name] is a 75 y.o. year old male who is a primary care patient of Laurell Roof and is actively engaged with the care management team. I reached out to [name] by phone today to assist with re-scheduling a follow up visit with the Pharmacist.  Follow up plan: Unsuccessful telephone outreach attempt made. A HIPAA compliant phone message was left for the patient providing contact information and requesting a return call. The care management team will reach out to the patient again over the next 7 days. If patient returns call to provider office, please advise to call [place] at [phone].  [place], [place] [address]

## 2020-12-14 ENCOUNTER — Telehealth: Payer: Medicare PPO

## 2020-12-14 ENCOUNTER — Telehealth: Payer: Self-pay

## 2020-12-14 NOTE — Telephone Encounter (Signed)
  Chronic Care Management   Outreach Note  12/14/2020 Name: Edgar [name] MRN: [id number] DOB: 1946/06/30  Primary Care Provider: Delsa Grana, PA-C Reason for referral : Chronic Care Management   An unsuccessful telephone outreach was attempted today. [name] is currently enrolled in the Chronic Care Management program.    Follow Up Plan:  A HIPAA compliant voice message was left today requesting a return call.    [facility] [phone]

## 2020-12-19 NOTE — Chronic Care Management (AMB) (Signed)
  Care Management   Note  12/19/2020 Name: Edgar [name] MRN: [id number] DOB: 17-Feb-1946  Edgar [name] is a 75 y.o. year old male who is a primary care patient of Laurell Roof and is actively engaged with the care management team. I reached out to [name] by phone today to assist with re-scheduling a follow up visit with the Pharmacist.  Follow up plan: Telephone appointment with care management team member scheduled for:12/21/2020  [place] Management

## 2020-12-20 ENCOUNTER — Telehealth: Payer: Self-pay

## 2020-12-20 ENCOUNTER — Other Ambulatory Visit: Payer: Self-pay | Admitting: Family Medicine

## 2020-12-20 NOTE — Progress Notes (Signed)
Spoke to patient to confirmed patient telephone appointment on 12/21/2020 for CCM at 9:00 am with [name] the Clinical pharmacist.   Patient Verbalized understanding and no any side effects from any of his current medications  [place] Pharmacist Assistant [phone]

## 2020-12-21 ENCOUNTER — Ambulatory Visit (INDEPENDENT_AMBULATORY_CARE_PROVIDER_SITE_OTHER): Payer: Medicare PPO

## 2020-12-21 DIAGNOSIS — E1121 Type 2 diabetes mellitus with diabetic nephropathy: Secondary | ICD-10-CM | POA: Diagnosis not present

## 2020-12-21 DIAGNOSIS — E1159 Type 2 diabetes mellitus with other circulatory complications: Secondary | ICD-10-CM | POA: Diagnosis not present

## 2020-12-21 DIAGNOSIS — I152 Hypertension secondary to endocrine disorders: Secondary | ICD-10-CM

## 2020-12-21 DIAGNOSIS — E785 Hyperlipidemia, unspecified: Secondary | ICD-10-CM

## 2020-12-21 DIAGNOSIS — E1169 Type 2 diabetes mellitus with other specified complication: Secondary | ICD-10-CM

## 2020-12-21 DIAGNOSIS — Z794 Long term (current) use of insulin: Secondary | ICD-10-CM

## 2020-12-21 NOTE — Progress Notes (Signed)
Chronic Care Management Pharmacy Note  12/21/2020 Name:  Edgar [name] MRN:  [id number] DOB:  Jan 24, 1946  Subjective: Edgar [name] is an 75 y.o. year old male who is a primary patient of Delsa Grana, [place].  The CCM team was consulted for assistance with disease management and care coordination needs.    Engaged with patient by telephone for follow up visit in response to provider referral for pharmacy case management and/or care coordination services.   Consent to Services:  The patient was given information about Chronic Care Management services, agreed to services, and gave verbal consent prior to initiation of services.  Please see initial visit note for detailed documentation.   Patient Care Team: Delsa Grana, PA-C as PCP - General (Family Medicine) Poggi, Marshall Cork, MD as Consulting Physician (Surgery) Neldon Labella, RN as Case Manager Germaine Pomfret, [facility] as Pharmacist (Pharmacist)  Recent office visits: 11/16/20: Patient presented to Carles Collet, PA-C for headache. Antihistamine for sinus inflammation.  10/28/20: Patient presented to Carles Collet, PA-C for follow-up. A1c stable at 7.4%. LDL 65. Xyzal stopped.  08/01/20: Patient presented to Delsa Grana, PA-C for follow-up. A1c 7.4%. Patient given blood sugar meter and instructed to check.   Recent consult visits: None in previous 6 months  Hospital visits: 11/12/20: Patient presented to ED for headache.   Objective:  Lab Results  Component Value Date   CREATININE 0.90 11/12/2020   BUN 13 11/12/2020   GFRNONAA >60 11/12/2020   GFRAA 80 08/01/2020   NA 141 11/12/2020   K 3.9 11/12/2020   CALCIUM 9.6 11/12/2020   CO2 25 11/12/2020    Lab Results  Component Value Date/Time   HGBA1C 7.4 (A) 10/28/2020 09:21 AM   HGBA1C 7.4 (H) 08/01/2020 09:46 AM   HGBA1C 7.6 (H) 04/29/2020 09:18 AM   MICROALBUR 20.5 10/15/2019 10:58 AM   MICROALBUR 15.2 09/01/2018 09:49 AM    Last diabetic Eye exam:  Lab Results   Component Value Date/Time   HMDIABEYEEXA No Retinopathy 10/26/2020 12:00 AM    Last diabetic Foot exam: No results found for: HMDIABFOOTEX   Lab Results  Component Value Date   CHOL 118 10/28/2020   HDL 38 (L) 10/28/2020   LDLCALC 65 10/28/2020   TRIG 74 10/28/2020   CHOLHDL 3.1 10/28/2020    Hepatic Function Latest Ref Rng & Units 10/28/2020 08/01/2020 04/29/2020  Total Protein 6.1 - 8.1 g/dL 7.4 7.4 7.3  Albumin 3.6 - 5.1 g/dL - - -  AST 10 - 35 U/L 13 14 20   ALT 9 - 46 U/L 9 15 17   Alk Phosphatase 40 - 115 U/L - - -  Total Bilirubin 0.2 - 1.2 mg/dL 0.6 0.6 0.6    Lab Results  Component Value Date/Time   TSH 2.71 08/26/2017 12:21 PM   TSH 3.00 12/07/2016 09:57 AM    CBC Latest Ref Rng & Units 11/12/2020 10/28/2020 04/29/2020  WBC 4.0 - 10.5 K/uL 5.9 6.2 5.8  Hemoglobin 13.0 - 17.0 g/dL 15.4 15.4 14.3  Hematocrit 39.0 - 52.0 % 45.8 47.2 44.1  Platelets 150 - 400 K/uL 255 260 240    Lab Results  Component Value Date/Time   VD25OH 52 08/26/2017 12:21 PM   VD25OH 45.9 01/06/2016 09:18 AM    Clinical ASCVD: No  The ASCVD Risk score (Goff DC Jr., et al., 2013) failed to calculate for the following reasons:   The valid total cholesterol range is 130 to 320 mg/dL    Depression screen PHQ  2/9 11/16/2020 10/28/2020 08/01/2020  Decreased Interest 0 0 0  Down, Depressed, Hopeless 0 0 0  PHQ - 2 Score 0 0 0  Altered sleeping - 0 -  Tired, decreased energy - 0 -  Change in appetite - 0 -  Feeling bad or failure about yourself  - 0 -  Trouble concentrating - 0 -  Moving slowly or fidgety/restless - 0 -  Suicidal thoughts - 0 -  PHQ-9 Score - 0 -  Difficult doing work/chores - Not difficult at all -  Some recent data might be hidden    Social History   Tobacco Use  Smoking Status Former Smoker  . Types: Cigarettes  . Quit date: 09/01/1978  . Years since quitting: 42.3  Smokeless Tobacco Never Used   BP Readings from Last 3 Encounters:  11/16/20 124/82  11/12/20  116/66  10/28/20 118/78   Pulse Readings from Last 3 Encounters:  11/16/20 81  11/12/20 (!) 56  10/28/20 90   Wt Readings from Last 3 Encounters:  11/16/20 276 lb 1.6 oz (125.2 kg)  11/12/20 270 lb (122.5 kg)  10/28/20 276 lb 1.6 oz (125.2 kg)    Assessment/Interventions: Review of patient past medical history, allergies, medications, health status, including review of consultants reports, laboratory and other test data, was performed as part of comprehensive evaluation and provision of chronic care management services.   SDOH:  (Social Determinants of Health) assessments and interventions performed: Yes SDOH Interventions   Flowsheet Row Most Recent Value  SDOH Interventions   Financial Strain Interventions Intervention Not Indicated      CCM Care Plan  No Known Allergies  Medications Reviewed Today    Reviewed by Mack Hook, LPN (Licensed Practical Nurse) on 11/28/20 at [place]  Med List Status: <None>  Medication Order Taking? Sig Documenting Provider Last Dose Status Informant  acetaminophen (TYLENOL) 500 MG tablet [id number] No Take 500 mg by mouth every 6 (six) hours as needed. [provider] Taking Active Multiple Informants           Med Note Tomasita Crumble, AMBER N   Fri Dec 07, 2016  9:14 AM) PRN  amLODipine (NORVASC) 10 MG tablet [id number] No TAKE 1 TABLET BY MOUTH EVERY DAY Delsa Grana, PA-C Taking Active   atorvastatin (LIPITOR) 40 MG tablet [id number] No TAKE 1 TABLET BY MOUTH EVERYDAY AT BEDTIME Delsa Grana, PA-C Taking Active   Blood Glucose Monitoring Suppl (ONE TOUCH ULTRA 2) w/Device KIT [id number] No Check fingerstick blood sugars three times a day; DX E11.65, LON 99 months  Patient not taking: No sig reported   Arnetha Courser, MD Not Taking Active   butalbital-acetaminophen-caffeine (FIORICET) 50-325-40 MG tablet [id number] No Take 1-2 tablets by mouth every 6 (six) hours as needed for headache. Vladimir Crofts, MD Taking Active   cholecalciferol  (VITAMIN D) 1000 units tablet [id number] No Take 1,000 Units by mouth 2 (two) times daily.  [provider] Taking Active Multiple Informants  empagliflozin (JARDIANCE) 25 MG TABS tablet [id number] No TAKE 1 TABLET BY MOUTH DAILY. (HIGHER DOSE) Delsa Grana, PA-C Taking Active   fluticasone (FLONASE) 50 MCG/ACT nasal spray [id number] No Place 2 sprays into both nostrils daily. Delsa Grana, PA-C Taking Active   glucose blood (ONE TOUCH ULTRA TEST) test strip [id number] No CHECK FINGERSTICK BLOOD SUGARS THREE TIMES A DAY, LON 99 MONTHS, for ICD-10 E11.65, E11.21, Z79.4  Patient not taking: Reported on 11/16/2020   Delsa Grana, PA-C Not Taking Active   HUMALOG [facility]  100 UNIT/ML KwikPen [id number] No INJECT 10 UNITS TOTAL INTO THE SKIN 3 (THREE) TIMES DAILY. Delsa Grana, PA-C Taking Active   Insulin Glargine (BASAGLAR KWIKPEN) 100 UNIT/ML [id number] No Inject 60 Units into the skin at bedtime. [address] [place], [place], PA-C Taking Active   Insulin Pen Needle (B-D UF III MINI PEN NEEDLES) 31G X 5 MM MISC [id number] No Use four a day to inject insulin.  Patient not taking: Reported on 11/16/2020   Trinna Post, PA-C Not Taking Active   Lancets [facility] ULTRASOFT) lancets [id number] No Check fingerstick blood sugar three times a day; For IDDM, uncontrolled, ICD-10 E11.65, E11.21, Z79.4, LON 99 months  Patient not taking: Reported on 11/16/2020   Delsa Grana, PA-C Not Taking Active   losartan (COZAAR) 100 MG tablet [id number] No Take 1 tablet (100 mg total) by mouth daily. Trinna Post, PA-C Taking Active           Patient Active Problem List   Diagnosis Date Noted  . Rhinitis 08/01/2020  . Aortic atherosclerosis ([place]) 08/01/2020  . Coronary artery disease 10/25/2017  . 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[place]) 05/14/2017  . History of colonoscopy with polypectomy 12/07/2016  . Fatty liver 09/11/2016  . Aneurysm of aorta  ([place]) 09/11/2016  . 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 Tinea pedis 06/25/2016  . Hx of pulmonary embolus 05/18/2016  . Essential hypertension, benign 12/23/2015  . Hyperlipidemia 12/23/2015  . Morbid obesity ([place]) 12/23/2015  . Type 2 diabetes mellitus with diabetic nephropathy, 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[place]) 01/25/2015    Immunization History  Administered Date(s) Administered  . Fluad Quad(high Dose 65+) 04/14/2019, 04/29/2020  . Influenza, High Dose Seasonal PF 05/30/2015, 04/30/2017, 05/29/2018  . Influenza-Unspecified 04/25/2016  . Moderna Sars-Covid-2 Vaccination 09/18/2019, 10/16/2019, 07/05/2020  . Pneumococcal Conjugate-13 10/08/2013  . Pneumococcal Polysaccharide-23 08/26/2017  . Pneumococcal-Unspecified 04/17/2010  . Td 05/13/2009  . Zoster 10/11/2013    Conditions to be addressed/monitored:  Hypertension, Hyperlipidemia, Diabetes and Coronary Artery Disease  Care Plan : General Pharmacy (Adult)  Updates made by Germaine Pomfret, RPH since 12/21/2020 12:00 AM    Problem: Hypertension, Hyperlipidemia, Diabetes and Coronary Artery Disease   Priority: High    Long-Range Goal: Patient-Specific Goal   Start Date: 10/26/2020  Expected End Date: 04/29/2021  This Visit's Progress: On track  Recent Progress: On track  Priority: High  Note:   Current Barriers:  . Unable to independently afford treatment regimen  Pharmacist Clinical Goal(s):  Marland Kitchen Over the next 90 days, patient will verbalize ability to afford treatment regimen . maintain control of diabetes as evidenced by A1c less than 8%  through collaboration with PharmD and provider.   Interventions: . 1:1 collaboration with Delsa Grana, PA-C regarding development and update of comprehensive plan of care as evidenced by provider attestation and co-signature . Inter-disciplinary care team collaboration (see longitudinal plan of care) . Comprehensive medication review performed; medication list updated in  electronic medical record  Hypertension (BP goal <140/90) -Controlled -Current treatment: . Amlodipine 10 mg daily  . Losartan 100 mg daily  -Medications previously tried: NA  -Current home readings: NA -Denies hypotensive/hypertensive symptoms -Educated on Importance of home blood pressure monitoring; -Counseled to monitor BP at home weekly, document, and provide log at future appointments -Recommended to continue current medication  Hyperlipidemia: (LDL goal < 70) -Controlled -Current treatment: . Atorvastatin 40 mg daily  -Medications previously tried: NA  -Educated on Importance  of limiting foods high in cholesterol; -Recommended to continue current medication  Diabetes (A1c goal <8%) -Controlled -Current medications: . Jardiance 25 mg daily  . Humalog 10 units three times daily  . Basaglar 60 units nightly  -Medications previously tried: NA  -Current home glucose readings  Fasting Pre-Supper  18-May 174   17-May 157 132  16-May 174 240  15-May 168 120  14-May 157 109  13-May 152 210  12-May 151 198  11-May 148 117  10-May 157 142  Average 160 159   -Denies hypoglycemic/hyperglycemic symptoms Prevention and management of hypoglycemic episodes; -Recommend continuing current medications.   Patient Goals/Self-Care Activities . Over the next 90 days, patient will:  - check glucose four times daily: before meals and at bedtime , document, and provide at future appointments check blood pressure weekly, document, and provide at future appointments  Follow Up Plan: Telephone follow up appointment with care management team member scheduled for:  03/22/2021 at 9:00 AM      Medication Assistance: Jardiance, Humalog, Basaglar obtained through Multiple medication assistance program.  Enrollment ends Dec 2022  Patient's preferred pharmacy is:  [id number] [address] [address]Phone: [phone]:  [phone] Uses pill box? Yes Pt endorses 100% compliance  We discussed: Current pharmacy is preferred with insurance plan and patient is satisfied with pharmacy services Patient decided to: Continue current medication management strategy  Care Plan and Follow Up Patient Decision:  Patient agrees to Care Plan and Follow-up.  Plan: Telephone follow up appointment with care management team member scheduled for:  03/22/2021 at 9:00 AM  [phone]

## 2020-12-30 DIAGNOSIS — M65321 Trigger finger, right index finger: Secondary | ICD-10-CM | POA: Insufficient documentation

## 2021-01-05 ENCOUNTER — Ambulatory Visit (INDEPENDENT_AMBULATORY_CARE_PROVIDER_SITE_OTHER): Payer: Medicare PPO

## 2021-01-05 ENCOUNTER — Other Ambulatory Visit: Payer: Self-pay

## 2021-01-05 VITALS — BP 112/68 | HR 87 | Temp 98.0°F | Resp 16 | Ht 73.0 in | Wt 274.5 lb

## 2021-01-05 DIAGNOSIS — Z0000 Encounter for general adult medical examination without abnormal findings: Secondary | ICD-10-CM

## 2021-01-05 NOTE — Progress Notes (Signed)
Subjective:   Edgar [name] is a 75 y.o. male who presents for Medicare Annual/Subsequent preventive examination.  Review of Systems     Cardiac Risk Factors include: advanced age (>57mn, >>66women);diabetes mellitus;dyslipidemia;male gender;hypertension;obesity (BMI >30kg/m2)     Objective:    Today's Vitals   01/05/21 0850  BP: 112/68  Pulse: 87  Resp: 16  Temp: 98 F (36.7 C)  TempSrc: Oral  SpO2: 96%  Weight: 274 lb 8 oz (124.5 kg)  Height: _0  (1.854 m)   Body mass index is 36.22 kg/m.  Advanced Directives 01/05/2021 11/12/2020 01/02/2020 12/17/2019 11/19/2019 10/17/2018 05/30/2017  Does Patient Have a Medical Advance Directive? _1  No No  Type of Advance Directive - - - - - - -  Would patient like information on creating a medical advance directive? Yes (MAU/Ambulatory/Procedural Areas - Information given) - No - Patient declined - No - Patient declined No - Patient declined -    Current Medications (verified) Outpatient Encounter Medications as of 01/05/2021  Medication Sig  . acetaminophen (TYLENOL) 500 MG tablet Take 500 mg by mouth every 6 (six) hours as needed.  .Marland KitchenamLODipine (NORVASC) 10 MG tablet TAKE 1 TABLET BY MOUTH EVERY DAY  . aspirin EC 81 MG tablet Take 81 mg by mouth daily. Swallow whole.  .Marland Kitchenatorvastatin (LIPITOR) 40 MG tablet TAKE 1 TABLET BY MOUTH EVERYDAY AT BEDTIME  . Blood Glucose Monitoring Suppl (ONE TOUCH ULTRA 2) w/Device KIT Check fingerstick blood sugars three times a day; DX E11.65, LON 99 months  . butalbital-acetaminophen-caffeine (FIORICET) 50-325-40 MG tablet Take 1-2 tablets by mouth every 6 (six) hours as needed for headache.  . cholecalciferol (VITAMIN D) 1000 units tablet Take 1,000 Units by mouth 2 (two) times daily.   .Marland Kitchenglucose blood (ONE TOUCH ULTRA TEST) test strip CHECK FINGERSTICK BLOOD SUGARS THREE TIMES A DAY, LON 99 MONTHS, for ICD-10 E11.65, E11.21, Z79.4  . HUMALOG KWIKPEN 100 UNIT/ML KwikPen INJECT 10 UNITS TOTAL  INTO THE SKIN 3 (THREE) TIMES DAILY.  .Marland KitchenInsulin Glargine (BASAGLAR KWIKPEN) 100 UNIT/ML Inject 60 Units into the skin at bedtime. LAssurant . Insulin Pen Needle (B-D UF III MINI PEN NEEDLES) 31G X 5 MM MISC Use four a day to inject insulin.  .Marland KitchenJARDIANCE 25 MG TABS tablet TAKE 1 TABLET BY MOUTH DAILY. (HIGHER DOSE)  . Lancets (ONETOUCH ULTRASOFT) lancets Check fingerstick blood sugar three times a day; For IDDM, uncontrolled, ICD-10 E11.65, E11.21, Z79.4, LON 99 months  . losartan (COZAAR) 100 MG tablet Take 1 tablet (100 mg total) by mouth daily.  . fluticasone (FLONASE) 50 MCG/ACT nasal spray Place 2 sprays into both nostrils daily. (Patient not taking: Reported on 01/05/2021)   No facility-administered encounter medications on file as of 01/05/2021.    Allergies (verified) Patient has no known allergies.   History: Past Medical History:  Diagnosis Date  . 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 [place] 09[place]  . Closed intertrochanteric fracture of left hip, sequela 05/30/2016  . Coronary artery disease 10/25/2017   CT scan March 2019  . Diabetes mellitus without complication ([place]   . Essential hypertension, benign 12/23/2015  . History of colonoscopy with polypectomy 12/07/2016   Polypectomy x 12 per patient  . Hx of tobacco use, presenting hazards to health 12/23/2015   Quit prior to 1997  . Hx of tobacco use, presenting hazards to health 12/23/2015  Quit prior to 1997   . Hyperlipidemia 12/23/2015  . Hypertension   . 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[place]) 05/14/2017  . Obesity 12/23/2015  . Pulmonary embolism ([place]) 05/18/2016   Hospitalization Sept 2017, following hip fracture   Past Surgical History:  Procedure Laterality Date  . COLONOSCOPY WITH PROPOFOL N/A 01/15/2017   Procedure: COLONOSCOPY WITH PROPOFOL;  Surgeon: Jonathon Bellows, MD;  Location: [facility] ENDOSCOPY;  Service: Endoscopy;  Laterality: N/A;  .  COLONOSCOPY WITH PROPOFOL N/A 12/17/2019   Procedure: COLONOSCOPY WITH PROPOFOL;  Surgeon: Jonathon Bellows, MD;  Location: [facility] ENDOSCOPY;  Service: Gastroenterology;  Laterality: N/A;  . EYE SURGERY    . FRACTURE SURGERY    . INTRAMEDULLARY (IM) NAIL INTERTROCHANTERIC Left 04/14/2016   Procedure: INTRAMEDULLARY (IM) NAIL INTERTROCHANTRIC;  Surgeon: Corky Mull, MD;  Location: [facility] ORS;  Service: Orthopedics;  Laterality: Left;  . JOINT REPLACEMENT Left 04/2016  . TOTAL HIP ARTHROPLASTY     Family History  Problem Relation Age of Onset  . Cancer Mother   . Diabetes Mother   . Heart disease Father   . Alcohol abuse Brother    Social History   Socioeconomic History  . Marital status: Widowed    Spouse name: Not on file  . Number of children: Not on file  . Years of education: Not on file  . Highest education level: Not on file  Occupational History  . Not on file  Tobacco Use  . Smoking status: Former Smoker    Types: Cigarettes    Quit date: 09/01/1978    Years since quitting: 42.3  . Smokeless tobacco: Never Used  Vaping Use  . Vaping Use: Never used  Substance and Sexual Activity  . Alcohol use: No    Alcohol/week: 0.0 standard drinks  . Drug use: No  . Sexual activity: Not Currently  Other Topics Concern  . Not on file  Social History Narrative   Pt's daughter lives with him   Social Determinants of Health   Financial Resource Strain: Medium Risk  . Difficulty of Paying Living Expenses: Somewhat hard  Food Insecurity: No Food Insecurity  . Worried About Charity fundraiser in the Last Year: Never true  . Ran Out of Food in the Last Year: Never true  Transportation Needs: No Transportation Needs  . Lack of Transportation (Medical): No  . Lack of Transportation (Non-Medical): No  Physical Activity: Sufficiently Active  . Days of Exercise per Week: 5 days  . Minutes of Exercise per Session: 30 min  Stress: No Stress Concern Present  . Feeling of Stress : Not at all   Social Connections: Moderately Isolated  . Frequency of Communication with Friends and Family: More than three times a week  . Frequency of Social Gatherings with Friends and Family: More than three times a week  . Attends Religious Services: More than 4 times per year  . Active Member of Clubs or Organizations: No  . Attends Archivist Meetings: Never  . Marital Status: Widowed    Tobacco Counseling Counseling given: Not Answered   Clinical Intake:  Pre-visit preparation completed: Yes  Pain : No/denies pain     BMI - recorded: 36.22 Nutritional Status: BMI > 30  Obese Nutritional Risks: None Diabetes: Yes CBG done?: No Did pt. bring in CBG monitor from home?: No  How often do you need to have someone help you when you read instructions, pamphlets, or other written materials from your doctor or  pharmacy?: 1 - Never  Nutrition Risk Assessment:  Has the patient had any N/V/D within the last 2 months?  No  Does the patient have any non-healing wounds?  Yes - left outer thigh Has the patient had any unintentional weight loss or weight gain?  No   Diabetes:  Is the patient diabetic?  Yes  If diabetic, was a CBG obtained today?  No  Did the patient bring in their glucometer from home?  No  How often do you monitor your CBG's? daily.   Financial Strains and Diabetes Management:  Are you having any financial strains with the device, your supplies or your medication? Yes . insulin Does the patient want to be seen by Chronic Care Management for management of their diabetes?  Yes  - already enrolled Would the patient like to be referred to a Nutritionist or for Diabetic Management?  No   Diabetic Exams:  Diabetic Eye Exam: Completed 10/27/20.   Diabetic Foot Exam: Completed 10/28/20.   Interpreter Needed?: No  Information entered by :: Clemetine Marker LPN   Activities of Daily Living In your present state of health, do you have any difficulty performing the  following activities: 01/05/2021 11/16/2020  Hearing? Y Y  Comment does not wear his hearing aids -  Vision? N N  Difficulty concentrating or making decisions? N N  Walking or climbing stairs? Y Y  Dressing or bathing? N N  Doing errands, shopping? N N  Preparing Food and eating ? N -  Using the Toilet? N -  In the past six months, have you accidently leaked urine? N -  Do you have problems with loss of bowel control? N -  Managing your Medications? N -  Managing your Finances? Y -  Housekeeping or managing your Housekeeping? N -  Some recent data might be hidden    Patient Care Team: Delsa Grana, PA-C as PCP - General (Family Medicine) Neldon Labella, RN as Case Manager Germaine Pomfret, [facility] as Pharmacist (Pharmacist) [name], DPM as Consulting Physician (Podiatry) Ortho, [facility]  Indicate any recent Medical Services you may have received from other than [facility] providers in the past year (date may be approximate).     Assessment:   This is a routine wellness examination for Tierre.  Hearing/Vision screen  Hearing Screening   _0  _1  _2  _3  _4  _5  _6  _7  _8   Right ear:           Left ear:           Comments: Pt has hearing aids but does not wear them often   Vision Screening Comments: Annual vision screenings done at [place] issues and exercise activities discussed: Current Exercise Habits: Home exercise routine, Type of exercise: walking, Time (Minutes): 30, Frequency (Times/Week): 5, Weekly Exercise (Minutes/Week): 150, Intensity: Mild, Exercise limited by: orthopedic condition(s)  Goals Addressed   None    Depression Screen PHQ 2/9 Scores 01/05/2021 11/16/2020 10/28/2020 08/01/2020 04/29/2020 12/07/2019 11/19/2019  PHQ - 2 Score 0 0 0 0 0 0 0  PHQ- 9 Score - - 0 - - - -    Fall Risk Fall Risk  01/05/2021 11/16/2020 10/28/2020 08/01/2020 04/29/2020  Falls in the past year? 0 0 0 0 0  Number falls in past yr: 0 0 0 0 0   Injury with Fall? 0 0 0 0 0  Risk for fall due to : No Fall Risks - - - -  Follow up Falls prevention  discussed Falls evaluation completed - Falls evaluation completed Falls evaluation completed    FALL RISK PREVENTION PERTAINING TO THE HOME:  Any stairs in or around the home? No  If so, are there any without handrails? No  Home free of loose throw rugs in walkways, pet beds, electrical cords, etc? Yes  Adequate lighting in your home to reduce risk of falls? Yes   ASSISTIVE DEVICES UTILIZED TO PREVENT FALLS:  Life alert? No  Use of a cane, walker or w/c? No  Grab bars in the bathroom? No  Shower chair or bench in shower? No  Elevated toilet seat or a handicapped toilet? No   TIMED UP AND GO:  Was the test performed? Yes .  Length of time to ambulate 10 feet: 5 sec.   Gait steady and fast without use of assistive device  Cognitive Function: Normal cognitive status assessed by direct observation by this Nurse Health Advisor. No abnormalities found.       6CIT Screen 10/17/2018  What Year? 0 points  What month? 0 points  What time? 0 points  Count back from 20 0 points  Months in reverse 0 points  Repeat phrase 2 points  Total Score 2    Immunizations Immunization History  Administered Date(s) Administered  . Fluad Quad(high Dose 65+) 04/14/2019, 04/29/2020  . Influenza, High Dose Seasonal PF 05/30/2015, 04/30/2017, 05/29/2018  . Influenza-Unspecified 04/25/2016  . Moderna Sars-Covid-2 Vaccination 09/18/2019, 10/16/2019, 07/05/2020  . Pneumococcal Conjugate-13 10/08/2013  . Pneumococcal Polysaccharide-23 08/26/2017  . Pneumococcal-Unspecified 04/17/2010  . Td 05/13/2009  . Zoster, Live 10/11/2013    TDAP status: Due, Education has been provided regarding the importance of this vaccine. Advised may receive this vaccine at local pharmacy or Health Dept. Aware to provide a copy of the vaccination record if obtained from local pharmacy or Health Dept. Verbalized  acceptance and understanding.  Flu Vaccine status: Up to date  Pneumococcal vaccine status: Up to date  Covid-19 vaccine status: Completed vaccines  Qualifies for Shingles Vaccine? Yes   Zostavax completed Yes   Shingrix Completed?: No.    Education has been provided regarding the importance of this vaccine. Patient has been advised to call insurance company to determine out of pocket expense if they have not yet received this vaccine. Advised may also receive vaccine at local pharmacy or Health Dept. Verbalized acceptance and understanding.  Screening Tests Health Maintenance  Topic Date Due  . Zoster Vaccines- Shingrix (1 of 2) Never done  . TETANUS/TDAP  08/06/2021 (Originally 05/14/2019)  . INFLUENZA VACCINE  03/06/2021  . HEMOGLOBIN A1C  04/30/2021  . OPHTHALMOLOGY EXAM  10/27/2021  . FOOT EXAM  10/28/2021  . COLONOSCOPY (Pts 45-56yr Insurance coverage will need to be confirmed)  12/17/2024  . COVID-19 Vaccine  Completed  . Hepatitis C Screening  Completed  . PNA vac Low Risk Adult  Completed  . HPV VACCINES  Aged Out    Health Maintenance  Health Maintenance Due  Topic Date Due  . Zoster Vaccines- Shingrix (1 of 2) Never done    Colorectal cancer screening: Type of screening: Colonoscopy. Completed 12/18/19. Repeat every 5 years  Lung Cancer Screening: (Low Dose CT Chest recommended if Age 74-80years, 30 pack-year currently smoking OR have quit w/in 15years.) does not qualify.   Additional Screening:  Hepatitis C Screening: does qualify; Completed 12/07/16  Vision Screening: Recommended annual ophthalmology exams for early detection of glaucoma and other disorders of the eye. Is the patient up to date  with their annual eye exam?  Yes  Who is the provider or what is the name of the office in which the patient attends annual eye exams? [facility].   Dental Screening: Recommended annual dental exams for proper oral hygiene  Community Resource Referral /  Chronic Care Management: CRR required this visit?  No   CCM required this visit?  No      Plan:     I have personally reviewed and noted the following in the patient's chart:   . Medical and social history . Use of alcohol, tobacco or illicit drugs  . Current medications and supplements including opioid prescriptions. Patient is not currently taking opioid prescriptions. . Functional ability and status . Nutritional status . Physical activity . Advanced directives . List of other physicians . Hospitalizations, surgeries, and ER visits in previous 12 months . Vitals . Screenings to include cognitive, depression, and falls . Referrals and appointments  In addition, I have reviewed and discussed with patient certain preventive protocols, quality metrics, and best practice recommendations. A written personalized care plan for preventive services as well as general preventive health recommendations were provided to patient.     Clemetine Marker, LPN   08/12/9148   Nurse Notes: pt states he needs a referral to wound care due to sore on left outer thigh near knee. Pt states it has been there for approx one year. Treating with neosporin with little relief. Pt states he has previously discussed with provider but did not want to show it to me today. Pt advised to scheduled follow up appt for referral. Pt scheduled to see Rachel Bo NP on 01/17/21.

## 2021-01-05 NOTE — Patient Instructions (Signed)
Mr. Edgar [name] , Thank you for taking time to come for your Medicare Wellness Visit. I appreciate your ongoing commitment to your health goals. Please review the following plan we discussed and let me know if I can assist you in the future.   Screening recommendations/referrals: Colonoscopy: done 12/18/19. Repeat in 2026 Recommended yearly ophthalmology/optometry visit for glaucoma screening and checkup Recommended yearly dental visit for hygiene and checkup  Vaccinations: Influenza vaccine: done 04/29/20 Pneumococcal vaccine: done 08/26/17 Tdap vaccine: due Shingles vaccine: Shingrix discussed. Please contact your pharmacy for coverage information.  Covid-19: done 09/18/19, 10/16/19 & 07/05/20  Advanced directives: Advance directive discussed with you today. I have provided a copy for you to complete at home and have notarized. Once this is complete please bring a copy in to our office so we can scan it into your chart.  Conditions/risks identified: Recommend healthy eating and physical activity to lower A1c  Next appointment: Follow up in one year for your annual wellness visit.   Preventive Care 74 Years and Older, Male Preventive care refers to lifestyle choices and visits with your health care provider that can promote health and wellness. What does preventive care include?  A yearly physical exam. This is also called an annual well check.  Dental exams once or twice a year.  Routine eye exams. Ask your health care provider how often you should have your eyes checked.  Personal lifestyle choices, including:  Daily care of your teeth and gums.  Regular physical activity.  Eating a healthy diet.  Avoiding tobacco and drug use.  Limiting alcohol use.  Practicing safe sex.  Taking low doses of aspirin every day.  Taking vitamin and mineral supplements as recommended by your health care provider. What happens during an annual well check? The services and screenings done by your  health care provider during your annual well check will depend on your age, overall health, lifestyle risk factors, and family history of disease. Counseling  Your health care provider may ask you questions about your:  Alcohol use.  Tobacco use.  Drug use.  Emotional well-being.  Home and relationship well-being.  Sexual activity.  Eating habits.  History of falls.  Memory and ability to understand (cognition).  Work and work Statistician. Screening  You may have the following tests or measurements:  Height, weight, and BMI.  Blood pressure.  Lipid and cholesterol levels. These may be checked every 5 years, or more frequently if you are over 41 years old.  Skin check.  Lung cancer screening. You may have this screening every year starting at age 43 if you have a 30-pack-year history of smoking and currently smoke or have quit within the past 15 years.  Fecal occult blood test (FOBT) of the stool. You may have this test every year starting at age 50.  Flexible sigmoidoscopy or colonoscopy. You may have a sigmoidoscopy every 5 years or a colonoscopy every 10 years starting at age 83.  Prostate cancer screening. Recommendations will vary depending on your family history and other risks.  Hepatitis C blood test.  Hepatitis B blood test.  Sexually transmitted disease (STD) testing.  Diabetes screening. This is done by checking your blood sugar (glucose) after you have not eaten for a while (fasting). You may have this done every 1-3 years.  Abdominal aortic aneurysm (AAA) screening. You may need this if you are a current or former smoker.  Osteoporosis. You may be screened starting at age 74 if you are at high risk.  Talk with your health care provider about your test results, treatment options, and if necessary, the need for more tests. Vaccines  Your health care provider may recommend certain vaccines, such as:  Influenza vaccine. This is recommended every  year.  Tetanus, diphtheria, and acellular pertussis (Tdap, Td) vaccine. You may need a Td booster every 10 years.  Zoster vaccine. You may need this after age 42.  Pneumococcal 13-valent conjugate (PCV13) vaccine. One dose is recommended after age 87.  Pneumococcal polysaccharide (PPSV23) vaccine. One dose is recommended after age 55. Talk to your health care provider about which screenings and vaccines you need and how often you need them. This information is not intended to replace advice given to you by your health care provider. Make sure you discuss any questions you have with your health care provider. Document Released: 08/19/2015 Document Revised: 04/11/2016 Document Reviewed: 05/24/2015 Elsevier Interactive Patient Education  [address] [place] Prevention in the Home Falls can cause injuries. They can happen to people of all ages. There are many things you can do to make your home safe and to help prevent falls. What can I do on the outside of my home?  Regularly fix the edges of walkways and driveways and fix any cracks.  Remove anything that might make you trip as you walk through a door, such as a raised step or threshold.  Trim any bushes or trees on the path to your home.  Use bright outdoor lighting.  Clear any walking paths of anything that might make someone trip, such as rocks or tools.  Regularly check to see if handrails are loose or broken. Make sure that both sides of any steps have handrails.  Any raised decks and porches should have guardrails on the edges.  Have any leaves, snow, or ice cleared regularly.  Use sand or salt on walking paths during winter.  Clean up any spills in your garage right away. This includes oil or grease spills. What can I do in the bathroom?  Use night lights.  Install grab bars by the toilet and in the tub and shower. Do not use towel bars as grab bars.  Use non-skid mats or decals in the tub or shower.  If you  need to sit down in the shower, use a plastic, non-slip stool.  Keep the floor dry. Clean up any water that spills on the floor as soon as it happens.  Remove soap buildup in the tub or shower regularly.  Attach bath mats securely with double-sided non-slip rug tape.  Do not have throw rugs and other things on the floor that can make you trip. What can I do in the bedroom?  Use night lights.  Make sure that you have a light by your bed that is easy to reach.  Do not use any sheets or blankets that are too big for your bed. They should not hang down onto the floor.  Have a firm chair that has side arms. You can use this for support while you get dressed.  Do not have throw rugs and other things on the floor that can make you trip. What can I do in the kitchen?  Clean up any spills right away.  Avoid walking on wet floors.  Keep items that you use a lot in easy-to-reach places.  If you need to reach something above you, use a strong step stool that has a grab bar.  Keep electrical cords out of the way.  Do not use floor polish or wax that makes floors slippery. If you must use wax, use non-skid floor wax.  Do not have throw rugs and other things on the floor that can make you trip. What can I do with my stairs?  Do not leave any items on the stairs.  Make sure that there are handrails on both sides of the stairs and use them. Fix handrails that are broken or loose. Make sure that handrails are as long as the stairways.  Check any carpeting to make sure that it is firmly attached to the stairs. Fix any carpet that is loose or worn.  Avoid having throw rugs at the top or bottom of the stairs. If you do have throw rugs, attach them to the floor with carpet tape.  Make sure that you have a light switch at the top of the stairs and the bottom of the stairs. If you do not have them, ask someone to add them for you. What else can I do to help prevent falls?  Wear shoes  that:  Do not have high heels.  Have rubber bottoms.  Are comfortable and fit you well.  Are closed at the toe. Do not wear sandals.  If you use a stepladder:  Make sure that it is fully opened. Do not climb a closed stepladder.  Make sure that both sides of the stepladder are locked into place.  Ask someone to hold it for you, if possible.  Clearly mark and make sure that you can see:  Any grab bars or handrails.  First and last steps.  Where the edge of each step is.  Use tools that help you move around (mobility aids) if they are needed. These include:  Canes.  Walkers.  Scooters.  Crutches.  Turn on the lights when you go into a dark area. Replace any light bulbs as soon as they burn out.  Set up your furniture so you have a clear path. Avoid moving your furniture around.  If any of your floors are uneven, fix them.  If there are any pets around you, be aware of where they are.  Review your medicines with your doctor. Some medicines can make you feel dizzy. This can increase your chance of falling. Ask your doctor what other things that you can do to help prevent falls. This information is not intended to replace advice given to you by your health care provider. Make sure you discuss any questions you have with your health care provider. Document Released: 05/19/2009 Document Revised: 12/29/2015 Document Reviewed: 08/27/2014 Elsevier Interactive Patient Education  2017 [employer].

## 2021-01-17 ENCOUNTER — Encounter: Payer: Self-pay | Admitting: Unknown Physician Specialty

## 2021-01-17 ENCOUNTER — Other Ambulatory Visit: Payer: Self-pay

## 2021-01-17 ENCOUNTER — Ambulatory Visit: Payer: Medicare PPO | Admitting: Unknown Physician Specialty

## 2021-01-17 VITALS — BP 130/76 | HR 88 | Temp 98.6°F | Resp 18 | Ht 73.0 in | Wt 272.2 lb

## 2021-01-17 DIAGNOSIS — R2240 Localized swelling, mass and lump, unspecified lower limb: Secondary | ICD-10-CM

## 2021-01-17 DIAGNOSIS — M79672 Pain in left foot: Secondary | ICD-10-CM

## 2021-01-17 DIAGNOSIS — M79671 Pain in right foot: Secondary | ICD-10-CM

## 2021-01-17 NOTE — Progress Notes (Signed)
BP 130/76   Pulse 88   Temp 98.6 F (37 C) (Oral)   Resp 18   Ht 6\' 1"  (1.854 m)   Wt 272 lb 3.2 oz (123.5 kg)   SpO2 95%   BMI 35.91 kg/m    Subjective:    Patient ID: Edgar [name], male    DOB: 13-Sep-1945, 75 y.o.   MRN: [id number]  HPI: Edgar [name] is a 75 y.o. male  Chief Complaint  Patient presents with   Referral    To wound care for left leg wound that will not heal   Pt is here for a lesion left upper leg.  Thinks he needs to see wound care as it helped his mother.  Thought it was related to a spider bite.  It's been hurting for quite some time.    Feet bothering him and causing pain.  Wants to get diabetic shoes.    Relevant past medical, surgical, family and social history reviewed and updated as indicated. Interim medical history since our last visit reviewed. Allergies and medications reviewed and updated.  Review of Systems  Per HPI unless specifically indicated above     Objective:    BP 130/76   Pulse 88   Temp 98.6 F (37 C) (Oral)   Resp 18   Ht 6\' 1"  (1.854 m)   Wt 272 lb 3.2 oz (123.5 kg)   SpO2 95%   BMI 35.91 kg/m   Wt Readings from Last 3 Encounters:  01/17/21 272 lb 3.2 oz (123.5 kg)  01/05/21 274 lb 8 oz (124.5 kg)  11/16/20 276 lb 1.6 oz (125.2 kg)    Physical Exam Constitutional:      General: He is not in acute distress.    Appearance: Normal appearance. He is well-developed.  HENT:     Head: Normocephalic and atraumatic.  Eyes:     General: Lids are normal. No scleral icterus.       Right eye: No discharge.        Left eye: No discharge.     Conjunctiva/sclera: Conjunctivae normal.  Neck:     Vascular: No carotid bruit or JVD.  Cardiovascular:     Rate and Rhythm: Normal rate and regular rhythm.     Heart sounds: Normal heart sounds.  Pulmonary:     Effort: Pulmonary effort is normal. No respiratory distress.     Breath sounds: Normal breath sounds.  Abdominal:     Palpations: There is no hepatomegaly or  splenomegaly.  Musculoskeletal:        General: Normal range of motion.     Cervical back: Normal range of motion and neck supple.  Skin:    General: Skin is warm and dry.     Coloration: Skin is not pale.     Findings: No rash.     Comments: Large nodule left lateral thigh about 10 cm.  Crusting and non ulcerated  Neurological:     Mental Status: He is alert and oriented to person, place, and time.  Psychiatric:        Behavior: Behavior normal.        Thought Content: Thought content normal.        Judgment: Judgment normal.   Diabetic Foot Exam - Simple   Simple Foot Form Visual Inspection No deformities, no ulcerations, no other skin breakdown bilaterally: Yes Sensation Testing Intact to touch and monofilament testing bilaterally: Yes Pulse Check Posterior Tibialis and Dorsalis pulse intact bilaterally: Yes  Comments     Results for orders placed or performed during the hospital encounter of 11/12/20  CBC with Differential/Platelet  Result Value Ref Range   WBC 5.9 4.0 - 10.5 K/uL   RBC 5.32 4.22 - 5.81 MIL/uL   Hemoglobin 15.4 13.0 - 17.0 g/dL   HCT 45.8 39.0 - 52.0 %   MCV 86.1 80.0 - 100.0 fL   MCH 28.9 26.0 - 34.0 pg   MCHC 33.6 30.0 - 36.0 g/dL   RDW 15.0 11.5 - 15.5 %   Platelets 255 150 - 400 K/uL   nRBC 0.0 0.0 - 0.2 %   Neutrophils Relative % 54 %   Neutro Abs 3.3 1.7 - 7.7 K/uL   Lymphocytes Relative 32 %   Lymphs Abs 1.9 0.7 - 4.0 K/uL   Monocytes Relative 9 %   Monocytes Absolute 0.5 0.1 - 1.0 K/uL   Eosinophils Relative 4 %   Eosinophils Absolute 0.2 0.0 - 0.5 K/uL   Basophils Relative 1 %   Basophils Absolute 0.1 0.0 - 0.1 K/uL   Immature Granulocytes 0 %   Abs Immature Granulocytes 0.01 0.00 - 0.07 K/uL  Basic metabolic panel  Result Value Ref Range   Sodium 141 135 - 145 mmol/L   Potassium 3.9 3.5 - 5.1 mmol/L   Chloride 110 98 - 111 mmol/L   CO2 25 22 - 32 mmol/L   Glucose, Bld 76 70 - 99 mg/dL   BUN 13 8 - 23 mg/dL   Creatinine, Ser  0.90 0.61 - 1.24 mg/dL   Calcium 9.6 8.9 - 10.3 mg/dL   GFR, Estimated >60 >60 mL/min   Anion gap 6 5 - 15  Sedimentation rate  Result Value Ref Range   Sed Rate 10 0 - 20 mm/hr  Troponin I (High Sensitivity)  Result Value Ref Range   Troponin I (High Sensitivity) 6 <18 ng/L      Assessment & Plan:   Problem List Items Addressed This Visit   None Visit Diagnoses     Nodule of skin of lower extremity    -  Primary   Refer to dermatology for thigh lesion   Relevant Orders   Ambulatory referral to Dermatology   Pain in both feet       Refer to podiatry for appropriate diabetic footwear   Relevant Orders   Ambulatory referral to Podiatry        Follow up plan: Return in about 4 weeks (around 02/14/2021) for diabetes check.

## 2021-01-19 ENCOUNTER — Ambulatory Visit: Payer: Medicare PPO | Admitting: Dermatology

## 2021-02-16 ENCOUNTER — Other Ambulatory Visit: Payer: Self-pay

## 2021-02-16 ENCOUNTER — Ambulatory Visit: Payer: Medicare PPO | Admitting: Unknown Physician Specialty

## 2021-02-16 ENCOUNTER — Encounter: Payer: Self-pay | Admitting: Unknown Physician Specialty

## 2021-02-16 VITALS — BP 124/72 | HR 82 | Temp 98.6°F | Resp 18 | Ht 73.0 in | Wt 277.5 lb

## 2021-02-16 DIAGNOSIS — Z794 Long term (current) use of insulin: Secondary | ICD-10-CM

## 2021-02-16 DIAGNOSIS — E782 Mixed hyperlipidemia: Secondary | ICD-10-CM

## 2021-02-16 DIAGNOSIS — I10 Essential (primary) hypertension: Secondary | ICD-10-CM

## 2021-02-16 DIAGNOSIS — E1121 Type 2 diabetes mellitus with diabetic nephropathy: Secondary | ICD-10-CM

## 2021-02-16 LAB — POCT GLYCOSYLATED HEMOGLOBIN (HGB A1C): Hemoglobin A1C: 6.8 % — AB (ref 4.0–5.6)

## 2021-02-16 NOTE — Assessment & Plan Note (Signed)
LDL <70 3 months ago.  Continue present medicatons

## 2021-02-16 NOTE — Progress Notes (Signed)
BP 124/72   Pulse 82   Temp 98.6 F (37 C) (Oral)   Resp 18   Ht 6\' 1"  (1.854 m)   Wt 277 lb 8 oz (125.9 kg)   SpO2 94%   BMI 36.61 kg/m    Subjective:    Patient ID: Edgar [name], male    DOB: 1945/09/14, 75 y.o.   MRN: [id number]  HPI: Edgar [name] is a 75 y.o. male  Chief Complaint  Patient presents with   Diabetes    Follow up   Diabetes: Using medications without difficulties:  Uses Jardiance 25 mg daily, Humalog 10 u TID and Basaglar 60 units daily No hypoglycemic episodes No hyperglycemic episodes Feet problems:none Blood Sugars averaging: Not clear if he checks his blood sugar at home eye exam within last year Last Hgb A1C: 7.4.  6.8 today  Hypertension  Using medications without difficulty: Amlodipine and Losartan Average home BPs:  Unable to say  Using medication without problems or lightheadedness No chest pain with exertion or shortness of breath No Edema  Elevated Cholesterol Using medications without problems: Atorvastatin No Muscle aches  Diet: Exercise: Walks around the court.  States he hardly eats anything.  Eats 2 meals a day    Relevant past medical, surgical, family and social history reviewed and updated as indicated. Interim medical history since our last visit reviewed. Allergies and medications reviewed and updated.  Review of Systems  Per HPI unless specifically indicated above     Objective:    BP 124/72   Pulse 82   Temp 98.6 F (37 C) (Oral)   Resp 18   Ht 6\' 1"  (1.854 m)   Wt 277 lb 8 oz (125.9 kg)   SpO2 94%   BMI 36.61 kg/m   Wt Readings from Last 3 Encounters:  02/16/21 277 lb 8 oz (125.9 kg)  01/17/21 272 lb 3.2 oz (123.5 kg)  01/05/21 274 lb 8 oz (124.5 kg)    Physical Exam Constitutional:      General: He is not in acute distress.    Appearance: Normal appearance. He is well-developed.  HENT:     Head: Normocephalic and atraumatic.  Eyes:     General: Lids are normal. No scleral icterus.       Right  eye: No discharge.        Left eye: No discharge.     Conjunctiva/sclera: Conjunctivae normal.  Neck:     Vascular: No carotid bruit or JVD.  Cardiovascular:     Rate and Rhythm: Normal rate and regular rhythm.     Heart sounds: Normal heart sounds.  Pulmonary:     Effort: Pulmonary effort is normal. No respiratory distress.     Breath sounds: Normal breath sounds.  Abdominal:     Palpations: There is no hepatomegaly or splenomegaly.  Musculoskeletal:        General: Normal range of motion.     Cervical back: Normal range of motion and neck supple.  Skin:    General: Skin is warm and dry.     Coloration: Skin is not pale.     Findings: No rash.  Neurological:     Mental Status: He is alert and oriented to person, place, and time.  Psychiatric:        Behavior: Behavior normal.        Thought Content: Thought content normal.        Judgment: Judgment normal.    Results for orders placed  or performed in visit on 02/16/21  POCT HgB A1C  Result Value Ref Range   Hemoglobin A1C 6.8 (A) 4.0 - 5.6 %   HbA1c POC (<> result, manual entry)     HbA1c, POC (prediabetic range)     HbA1c, POC (controlled diabetic range)        Assessment & Plan:   Problem List Items Addressed This Visit       Unprioritized   Essential hypertension, benign (Chronic)    Stable, continue present medications.         Hyperlipidemia (Chronic)    LDL <70 3 months ago.  Continue present medicatons       Morbid obesity ([place]) (Chronic)    Pt states he baely eats.  Diet recall reveals mostly  healthy eating habits but we might not be getting an accurate history       Type 2 diabetes mellitus with diabetic nephropathy, 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HCC) - Primary    Stable, continue present medications.  Hgb A1C is 6.8 today         Relevant Orders   POCT HgB A1C (Completed)     Follow up plan: Return in about 6 months (around 08/19/2021).

## 2021-02-16 NOTE — Assessment & Plan Note (Signed)
Pt states he baely eats.  Diet recall reveals mostly  healthy eating habits but we might not be getting an accurate history

## 2021-02-16 NOTE — Assessment & Plan Note (Signed)
Stable, continue present medications.   

## 2021-02-16 NOTE — Assessment & Plan Note (Signed)
Stable, continue present medications.  Hgb A1C is 6.8 today

## 2021-02-22 ENCOUNTER — Ambulatory Visit (INDEPENDENT_AMBULATORY_CARE_PROVIDER_SITE_OTHER): Payer: Medicare PPO

## 2021-02-22 DIAGNOSIS — Z794 Long term (current) use of insulin: Secondary | ICD-10-CM | POA: Diagnosis not present

## 2021-02-22 DIAGNOSIS — E1121 Type 2 diabetes mellitus with diabetic nephropathy: Secondary | ICD-10-CM | POA: Diagnosis not present

## 2021-02-22 NOTE — Chronic Care Management (AMB) (Signed)
Chronic Care Management   CCM RN Visit Note  02/22/2021 Name: Edgar [name] MRN: [id number] DOB: [birth year]  Subjective: Edgar [name] is a 75 y.o. year old male who is a primary care patient of Edgar [name], [place]. The care management team was consulted for assistance with disease management and care coordination needs.    Engaged with patient by telephone for follow up visit in response to provider referral for case management and/or care coordination services.   Consent to Services:  The patient was given information about Chronic Care Management services, agreed to services, and gave verbal consent prior to initiation of services.  Please see initial visit note for detailed documentation.   Assessment: Review of patient past medical history, allergies, medications, health status, including review of consultants reports, laboratory and other test data, was performed as part of comprehensive evaluation and provision of chronic care management services.   SDOH (Social Determinants of Health) assessments and interventions performed:  No  CCM Care Plan  No Known Allergies  Outpatient Encounter Medications as of 02/22/2021  Medication Sig Note   acetaminophen (TYLENOL) 500 MG tablet Take 500 mg by mouth every 6 (six) hours as needed. 12/07/2016: PRN   amLODipine (NORVASC) 10 MG tablet TAKE 1 TABLET BY MOUTH EVERY DAY    aspirin EC 81 MG tablet Take 81 mg by mouth daily. Swallow whole.    atorvastatin (LIPITOR) 40 MG tablet TAKE 1 TABLET BY MOUTH EVERYDAY AT BEDTIME    Blood Glucose Monitoring Suppl (ONE TOUCH ULTRA 2) w/Device KIT Check fingerstick blood sugars three times a day; DX E11.65, LON 99 months (Patient not taking: No sig reported)    butalbital-acetaminophen-caffeine (FIORICET) 50-325-40 MG tablet Take 1-2 tablets by mouth every 6 (six) hours as needed for headache.    cholecalciferol (VITAMIN D) 1000 units tablet Take 1,000 Units by mouth 2 (two) times daily.     fluticasone  (FLONASE) 50 MCG/ACT nasal spray Place 2 sprays into both nostrils daily. (Patient not taking: No sig reported)    glucose blood (ONE TOUCH ULTRA TEST) test strip CHECK FINGERSTICK BLOOD SUGARS THREE TIMES A DAY, LON 99 MONTHS, for ICD-10 E11.65, E11.21, Z79.4 (Patient not taking: No sig reported)    HUMALOG KWIKPEN 100 UNIT/ML KwikPen INJECT 10 UNITS TOTAL INTO THE SKIN 3 (THREE) TIMES DAILY.    Insulin Glargine (BASAGLAR KWIKPEN) 100 UNIT/ML Inject 60 Units into the skin at bedtime. Lilly Cares    Insulin Pen Needle (B-D UF III MINI PEN NEEDLES) 31G X 5 MM MISC Use four a day to inject insulin. (Patient not taking: No sig reported)    JARDIANCE 25 MG TABS tablet TAKE 1 TABLET BY MOUTH DAILY. (HIGHER DOSE)    Lancets (ONETOUCH ULTRASOFT) lancets Check fingerstick blood sugar three times a day; For IDDM, uncontrolled, ICD-10 E11.65, E11.21, Z79.4, LON 99 months (Patient not taking: No sig reported)    losartan (COZAAR) 100 MG tablet Take 1 tablet (100 mg total) by mouth daily.    No facility-administered encounter medications on file as of 02/22/2021.    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   Tinea pedis 06/25/2016   Hx of pulmonary embolus 05/18/2016   Essential hypertension, benign 12/23/2015   Hyperlipidemia 12/23/2015   Morbid obesity ([place]) 12/23/2015   Type 2 diabetes mellitus with diabetic nephropathy, 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[place]) 01/25/2015    Conditions to be addressed/monitored:DMII  Patient Care Plan: Diabetes Type 2 (Adult)   Completed 02/22/2021   Problem Identified: Glycemic Management (Diabetes, Type 2) Resolved 02/22/2021     Goal: Glycemic Management Optimized Completed 02/22/2021  Start Date: 06/26/2020  Expected End Date: 10/24/2020  Priority: High  Current Barriers:  Chronic Disease Management support and educational needs r/t Diabetes self management.  Case Manager Clinical Goal(s):  Over the next 120 days, patient will demonstrate improved adherence to prescribed treatment plan for diabetes self care/management as evidenced by: monitoring and recording CBG , taking medications as prescribed, and adherence to ADA/ carb modified diet adherence.   Interventions:  Collaboration with Edgar Grana, PA-C regarding development and update of comprehensive plan of care as evidenced by provider attestation and co-signature Inter-disciplinary care team collaboration (see longitudinal plan of care) Discussed medications and compliance with diabetes treatment plan. Reports taking medications as prescribed and monitoring blood glucose levels routinely. Denies episodes of hypoglycemia or hyperglycemia. Reports readings have been within range. A1C is currently at goal of less than 7%. Denies need for additional diabetes education or resources.    Self-Care Activities/Patient Goals: Take medications as prescribed Attend provider appointments as scheduled Monitor blood glucose levels and record readings Adhere to recommended ADA/Carb modified diet Contact provider or care management team with questions or concerns as needed    No Further Outreach Required       PLAN: No further follow up required. Edgar [name] agreed to call if additional nursing outreach is needed.     Cristy Friedlander [facility] Care Management [facility] [phone]

## 2021-02-22 NOTE — Patient Instructions (Addendum)
Thank you for allowing the Chronic Care Management team to participate in your care.    Goal Addressed: Patient Care Plan: Diabetes Type 2 (Adult)  Completed 02/22/2021   Problem Identified: Glycemic Management (Diabetes, Type 2) Resolved 02/22/2021     Goal: Glycemic Management Optimized Completed 02/22/2021  Start Date: 06/26/2020  Expected End Date: 10/24/2020  Priority: High  Current Barriers:  Chronic Disease Management support and educational needs r/t Diabetes self management.  Case Manager Clinical Goal(s):  Over the next 120 days, patient will demonstrate improved adherence to prescribed treatment plan for diabetes self care/management as evidenced by: monitoring and recording CBG , taking medications as prescribed, and adherence to ADA/ carb modified diet adherence.   Interventions:  Collaboration with Edgar Grana, PA-C regarding development and update of comprehensive plan of care as evidenced by provider attestation and co-signature Inter-disciplinary care team collaboration (see longitudinal plan of care) Discussed medications and compliance with diabetes treatment plan. Reports taking medications as prescribed and monitoring blood glucose levels routinely. Denies episodes of hypoglycemia or hyperglycemia. Reports readings have been within range. A1C is currently at goal of less than 7%. Denies need for additional diabetes education or resources.    Self-Care Activities/Patient Goals: Take medications as prescribed Attend provider appointments as scheduled Monitor blood glucose levels and record readings Adhere to recommended ADA/Carb modified diet Contact provider or care management team with questions or concerns as needed    No Further Outreach Required        Edgar [name] verbalized understanding of the information discussed during the telephonic outreach today. Declined need for mailed/printed resources. Agreed to call if additional nursing outreach is needed. No  further follow-up required.     Edgar [name] [facility] Care Management [facility] [phone]

## 2021-03-02 ENCOUNTER — Other Ambulatory Visit: Payer: Self-pay

## 2021-03-02 ENCOUNTER — Encounter: Payer: Self-pay | Admitting: Podiatry

## 2021-03-02 ENCOUNTER — Ambulatory Visit: Payer: Medicare PPO | Admitting: Podiatry

## 2021-03-02 DIAGNOSIS — M79675 Pain in left toe(s): Secondary | ICD-10-CM | POA: Diagnosis not present

## 2021-03-02 DIAGNOSIS — M79674 Pain in right toe(s): Secondary | ICD-10-CM

## 2021-03-02 DIAGNOSIS — E0843 Diabetes mellitus due to underlying condition with diabetic autonomic (poly)neuropathy: Secondary | ICD-10-CM

## 2021-03-02 DIAGNOSIS — M79676 Pain in unspecified toe(s): Secondary | ICD-10-CM

## 2021-03-02 DIAGNOSIS — B351 Tinea unguium: Secondary | ICD-10-CM | POA: Diagnosis not present

## 2021-03-02 NOTE — Progress Notes (Signed)
This patient returns to my office for at risk foot care.  This patient requires this care by a professional since this patient will be at risk due to having  Diabetes with diabetic nephropathy.  This patient is unable to cut nails himself since the patient cannot reach his nails.These nails are painful walking and wearing shoes.  This patient presents for at risk foot care today.  General Appearance  Alert, conversant and in no acute stress.  Vascular  Dorsalis pedis and posterior tibial  pulses are palpable  bilaterally.  Capillary return is within normal limits  bilaterally. Temperature is within normal limits  bilaterally.  Neurologic  Senn-Weinstein monofilament wire test diminished  bilaterally. Muscle power within normal limits bilaterally.  Nails Thick disfigured discolored nails with subungual debris  from hallux to fifth toes bilaterally. No evidence of bacterial infection or drainage bilaterally.  Orthopedic  No limitations of motion  feet .  No crepitus or effusions noted.  No bony pathology or digital deformities noted.  HAV  B/L  Skin  normotropic skin with no porokeratosis noted bilaterally.  No signs of infections or ulcers noted.     Onychomycosis  Pain in right toes  Pain in left toes  Consent was obtained for treatment procedures.   Mechanical debridement of nails 1-5  bilaterally performed with a nail nipper.  Filed with dremel without incident. Patient qualifies for diabetic shoes due to DPN and HAV  B/L.  Patient to be measured for shoes in the future.   Return office visit   3 months                   Told patient to return for periodic foot care and evaluation due to potential at risk complications.   [name] DPM

## 2021-03-21 ENCOUNTER — Telehealth: Payer: Self-pay

## 2021-03-21 NOTE — Progress Notes (Addendum)
    Chronic Care Management Pharmacy Assistant   Name: Edgar [name]  MRN: [id number] DOB: October 04, 1945  03/28/2021 @ [id number]  Left VM requesting the patient's daughter return my call in regards to updated financial information in order to get patient approved for Jardiance.   @ 1532 spoke with the patients daughter Edgar [name] as she returned my call and explained to her that since the patient has only one income now if we can get that updated information faxed over to [facility] @ [phone] we can fax it to [employer] and have them appeal the decision regarding the patients Jardiance denial. She stated that she would get this faxed over as soon as she can. Sent [name] a message informing him to be on the look out for the fax as it will be Attention to him, and that he will need to fax the updated financial information along with a letter from the providers office explaining the situation and requesting that an appeal be done on the patient's behalf.   03/27/2021 '@1608'$   Left a VM for the patient and I also left a VM for the patients daughter requesting either one to call me back regarding the application for the patients Jardiance and the updated income information that is needed.  03/22/2021 task received requesting that I contact [employer] @ [phone] regarding the Jardiance application and spoke with [facility] and he stated that the patients application was denied for being over the income level. Informed Edgar [name], CPP of this news and Excel sheet updated.  Edgar [name], CPP reached back out and stated that the patient's spouse passed away since 10-23-2022 and that his income possibly changed. I am reaching back out to [employer] to see if patient would need to submit a new application due to the possible. Spoke with Edgar [name] and she stated no new application needs to be submitted just reissue his current income with him by himself and a letter from the providers office stating that the patient's spouse passed  so it can be submitted up and they can process an Appeal. She also stated that she would make a note on the patient's chart.   Also reached out to [facility] regarding qualifications of patient getting a Freestyle Libre 2. I spoke with representative stated that the patient would have a 20% coinsurance for this product.   Reached out to the patient to inform him of what is needed but I had to LVM informing him of the next steps with a request for him to call me back if he has any questions.     Patient called to be reminded of his appointment with Edgar [name], CPP on 03/22/2021 @ 0900 via Telephone.  No answer, left message of appointment date, time and type of appointment (either telephone or in person). Left message to have all medications, supplements, blood pressure and/or blood sugar logs available during appointment and to return call if need to reschedule.  Star Rating Drug: Jardiance 25 mg last filled on Oct 23, 2020 (possible PAP) Losartan 100 mg last filled on 10/24/2020 for a 90-Day supply with [facility] Atorvastatin 40 mg last filled on 10/25/2019 for a 90-Day supply with [facility]  Any gaps in medications fill history? YES  Care Gaps: [id number] Vaccine Booster 4 INFLUENZA VACCINE  Edgar [name], CPA/CMA Clinical Pharmacist Assistant Phone: [phone]

## 2021-03-22 ENCOUNTER — Other Ambulatory Visit: Payer: Self-pay

## 2021-03-22 ENCOUNTER — Ambulatory Visit (INDEPENDENT_AMBULATORY_CARE_PROVIDER_SITE_OTHER): Payer: Medicare PPO

## 2021-03-22 ENCOUNTER — Ambulatory Visit (INDEPENDENT_AMBULATORY_CARE_PROVIDER_SITE_OTHER): Payer: Medicare PPO | Admitting: Podiatry

## 2021-03-22 ENCOUNTER — Encounter: Payer: Self-pay | Admitting: Podiatry

## 2021-03-22 DIAGNOSIS — E1121 Type 2 diabetes mellitus with diabetic nephropathy: Secondary | ICD-10-CM

## 2021-03-22 DIAGNOSIS — E785 Hyperlipidemia, unspecified: Secondary | ICD-10-CM | POA: Diagnosis not present

## 2021-03-22 DIAGNOSIS — Z794 Long term (current) use of insulin: Secondary | ICD-10-CM

## 2021-03-22 DIAGNOSIS — E1159 Type 2 diabetes mellitus with other circulatory complications: Secondary | ICD-10-CM

## 2021-03-22 DIAGNOSIS — I152 Hypertension secondary to endocrine disorders: Secondary | ICD-10-CM

## 2021-03-22 DIAGNOSIS — B353 Tinea pedis: Secondary | ICD-10-CM

## 2021-03-22 DIAGNOSIS — E1169 Type 2 diabetes mellitus with other specified complication: Secondary | ICD-10-CM

## 2021-03-22 MED ORDER — EMPAGLIFLOZIN 25 MG PO TABS: 25.0000 mg | ORAL_TABLET | Freq: Every day | ORAL | 3 refills | Status: DC

## 2021-03-22 NOTE — Progress Notes (Signed)
Patient presented for foam casting for 3 pair custom diabetic shoe inserts. Patient is measured with a brannock device to be a size 13 wide  Diabetic shoes are chosen from the [employer] catalog  The shoes chosen are 510  The patient will be contacted when the shoes and inserts are ready to be picked up.   [name] DPM

## 2021-03-22 NOTE — Patient Instructions (Signed)
Visit Information It was great speaking with you today!  Please let me know if you have any questions about our visit.   Goals Addressed             This Visit's Progress    Monitor and Manage My Blood Sugar-Diabetes Type 2   On track    Timeframe:  Long-Range Goal Priority:  High Start Date:  10/27/2020                           Expected End Date:  04/29/2022                     Follow Up Date 08/04/2021    - check blood sugar twice daily: before meals and at bedtime  - check blood sugar if I feel it is too high or too low - enter blood sugar readings and medication or insulin into daily log - take the blood sugar log to all doctor visits    Why is this important?   Checking your blood sugar at home helps to keep it from getting very high or very low.  Writing the results in a diary or log helps the doctor know how to care for you.  Your blood sugar log should have the time, date and the results.  Also, write down the amount of insulin or other medicine that you take.  Other information, like what you ate, exercise done and how you were feeling, will also be helpful.     Notes:         Patient Care Plan: General Pharmacy (Adult)     Problem Identified: Hypertension, Hyperlipidemia, Diabetes and Coronary Artery Disease   Priority: High     Long-Range Goal: Patient-Specific Goal   Start Date: 10/26/2020  Expected End Date: 03/22/2022  This Visit's Progress: On track  Recent Progress: On track  Priority: High  Note:   Current Barriers:  Unable to independently afford treatment regimen  Pharmacist Clinical Goal(s):  Over the next 90 days, patient will verbalize ability to afford treatment regimen maintain control of diabetes as evidenced by A1c less than 8%  through collaboration with PharmD and provider.   Interventions: 1:1 collaboration with Delsa Grana, PA-C regarding development and update of comprehensive plan of care as evidenced by provider attestation and  co-signature Inter-disciplinary care team collaboration (see longitudinal plan of care) Comprehensive medication review performed; medication list updated in electronic medical record  Hypertension (BP goal <140/90) -Controlled -Current treatment: Amlodipine 10 mg daily  Losartan 100 mg daily  -Medications previously tried: NA  -Current home readings: NA -Denies hypotensive/hypertensive symptoms -Educated on Importance of home blood pressure monitoring; -Counseled to monitor BP at home weekly, document, and provide log at future appointments -Recommended to continue current medication  Hyperlipidemia: (LDL goal < 70) -Controlled -Current treatment: Atorvastatin 40 mg daily  -Medications previously tried: NA  -Educated on Importance of limiting foods high in cholesterol; -Recommended to continue current medication  Diabetes (A1c goal <8%) -Controlled -Current medications: Jardiance 25 mg daily  Humalog 10 units three times daily  Basaglar 60 units nightly  -Medications previously tried: NA  -Current home glucose readings: 140-175 is typical reported range.   -Denies hypoglycemic/hyperglycemic symptoms. Does report one instance of hypoglycemia in June/July when he got busy with work and didn't eat.  Prevention and management of hypoglycemic episodes; -Patient reports needing Jardiance Refill from [facility]  -Patient interested in CGM Sensor  if covered under insurance.  -Recommend continuing current medications.   Patient Goals/Self-Care Activities Over the next 90 days, patient will:  - check glucose four times daily: before meals and at bedtime , document, and provide at future appointments check blood pressure weekly, document, and provide at future appointments  Follow Up Plan: Telephone follow up appointment with care management team member scheduled for:  09/20/2021 at 9:00 AM    Patient agreed to services and verbal consent obtained.   Patient verbalizes  understanding of instructions provided today and agrees to view in [place].   Malva Limes, [facility] [phone]

## 2021-03-22 NOTE — Progress Notes (Signed)
Chronic Care Management Pharmacy Note  03/22/2021 Name:  Edgar [name] MRN:  [id number] DOB:  11-17-1945  Summary: Patient presents for CCM Follow-up. Patient reports blood sugars have remained stable, with minimal hypoglycemia episodes. He was interested in a CGM sensor if it was covered under his insurance. He does have some potential adherence gaps for his blood pressure/cholesterol, but reports adherence to all his medications.  Recommendations/Changes made from today's visit: Continue current medications  Plan: CPP follow-up 6 months  Subjective: Edgar [name] is an 75 y.o. year old male who is a primary patient of Delsa Grana, [place].  The CCM team was consulted for assistance with disease management and care coordination needs.    Engaged with patient by telephone for follow up visit in response to provider referral for pharmacy case management and/or care coordination services.   Consent to Services:  The patient was given information about Chronic Care Management services, agreed to services, and gave verbal consent prior to initiation of services.  Please see initial visit note for detailed documentation.   Patient Care Team: Delsa Grana, PA-C as PCP - General (Family Medicine) Germaine Pomfret, [facility] as Pharmacist (Pharmacist) [name], DPM as Consulting Physician (Podiatry) Ortho, [facility]  Recent office visits: 02/16/21: Patient presented to Kathrine Haddock, NP for follow-up. A1c improved to 6.8%. 01/17/21: Patient presented to Kathrine Haddock, NP for skin nodule. 11/16/20: Patient presented to Carles Collet, PA-C for headache. Antihistamine for sinus inflammation.  10/28/20: Patient presented to Carles Collet, PA-C for follow-up. A1c stable at 7.4%. LDL 65. Xyzal stopped.  08/01/20: Patient presented to Delsa Grana, PA-C for follow-up. A1c 7.4%. Patient given blood sugar meter and instructed to check.   Recent consult visits: None in previous 6 months  Hospital  visits: 11/12/20: Patient presented to ED for headache.   Objective:  Lab Results  Component Value Date   CREATININE 0.90 11/12/2020   BUN 13 11/12/2020   GFRNONAA >60 11/12/2020   GFRAA 80 08/01/2020   NA 141 11/12/2020   K 3.9 11/12/2020   CALCIUM 9.6 11/12/2020   CO2 25 11/12/2020    Lab Results  Component Value Date/Time   HGBA1C 6.8 (A) 02/16/2021 09:38 AM   HGBA1C 7.4 (A) 10/28/2020 09:21 AM   HGBA1C 7.4 (H) 08/01/2020 09:46 AM   HGBA1C 7.6 (H) 04/29/2020 09:18 AM   MICROALBUR 20.5 10/15/2019 10:58 AM   MICROALBUR 15.2 09/01/2018 09:49 AM    Last diabetic Eye exam:  Lab Results  Component Value Date/Time   HMDIABEYEEXA No Retinopathy 10/26/2020 12:00 AM    Last diabetic Foot exam: No results found for: HMDIABFOOTEX   Lab Results  Component Value Date   CHOL 118 10/28/2020   HDL 38 (L) 10/28/2020   LDLCALC 65 10/28/2020   TRIG 74 10/28/2020   CHOLHDL 3.1 10/28/2020    Hepatic Function Latest Ref Rng & Units 10/28/2020 08/01/2020 04/29/2020  Total Protein 6.1 - 8.1 g/dL 7.4 7.4 7.3  Albumin 3.6 - 5.1 g/dL - - -  AST 10 - 35 U/L _0 ALT 9 - 46 U/L _1 Alk Phosphatase 40 - 115 U/L - - -  Total Bilirubin 0.2 - 1.2 mg/dL 0.6 0.6 0.6    Lab Results  Component Value Date/Time   TSH 2.71 08/26/2017 12:21 PM   TSH 3.00 12/07/2016 09:57 AM    CBC Latest Ref Rng & Units 11/12/2020 10/28/2020 04/29/2020  WBC 4.0 - 10.5 K/uL 5.9 6.2 5.8  Hemoglobin 13.0 - 17.0 g/dL 15.4 15.4 14.3  Hematocrit 39.0 - 52.0 % 45.8 47.2 44.1  Platelets 150 - 400 K/uL 255 260 240    Lab Results  Component Value Date/Time   VD25OH 52 08/26/2017 12:21 PM   VD25OH 45.9 01/06/2016 09:18 AM    Clinical ASCVD: No  The ASCVD Risk score Mikey Bussing DC Jr., et al., 2013) failed to calculate for the following reasons:   The valid total cholesterol range is 130 to 320 mg/dL    Depression screen [facility] 2/9 02/16/2021 01/17/2021 01/05/2021  Decreased Interest 0 0 0  Down, Depressed, Hopeless 0  0 0  PHQ - 2 Score 0 0 0  Altered sleeping - - -  Tired, decreased energy - - -  Change in appetite - - -  Feeling bad or failure about yourself  - - -  Trouble concentrating - - -  Moving slowly or fidgety/restless - - -  Suicidal thoughts - - -  PHQ-9 Score - - -  Difficult doing work/chores - - -  Some recent data might be hidden    Social History   Tobacco Use  Smoking Status Former   Types: Cigarettes   Quit date: 09/01/1978   Years since quitting: 42.5  Smokeless Tobacco Never   BP Readings from Last 3 Encounters:  02/16/21 124/72  01/17/21 130/76  01/05/21 112/68   Pulse Readings from Last 3 Encounters:  02/16/21 82  01/17/21 88  01/05/21 87   Wt Readings from Last 3 Encounters:  02/16/21 277 lb 8 oz (125.9 kg)  01/17/21 272 lb 3.2 oz (123.5 kg)  01/05/21 274 lb 8 oz (124.5 kg)    Assessment/Interventions: Review of patient past medical history, allergies, medications, health status, including review of consultants reports, laboratory and other test data, was performed as part of comprehensive evaluation and provision of chronic care management services.   SDOH:  (Social Determinants of Health) assessments and interventions performed: Yes SDOH Interventions    Flowsheet Row Most Recent Value  SDOH Interventions   Financial Strain Interventions Intervention Not Indicated        CCM Care Plan  No Known Allergies  Medications Reviewed Today     Reviewed by [name], DPM (Physician) on 03/02/21 at [place] List Status: <None>   Medication Order Taking? Sig Documenting Provider Last Dose Status Informant  acetaminophen (TYLENOL) 500 MG tablet [id number] Yes Take 500 mg by mouth every 6 (six) hours as needed. [provider] Taking Active Multiple Informants           Med Note Tomasita Crumble, AMBER N   Fri Dec 07, 2016  9:14 AM) PRN  amLODipine (NORVASC) 10 MG tablet [id number] Yes TAKE 1 TABLET BY MOUTH EVERY DAY Delsa Grana, PA-C Taking Active    aspirin EC 81 MG tablet [id number] Yes Take 81 mg by mouth daily. Swallow whole. [provider] Taking Active   atorvastatin (LIPITOR) 40 MG tablet [id number] Yes TAKE 1 TABLET BY MOUTH EVERYDAY AT BEDTIME Delsa Grana, PA-C Taking Active   Blood Glucose Monitoring Suppl (ONE TOUCH ULTRA 2) w/Device KIT [id number] Yes Check fingerstick blood sugars three times a day; DX E11.65, LON 99 months Lada, Satira Anis, MD Taking Active   butalbital-acetaminophen-caffeine (FIORICET) 50-325-40 MG tablet [id number] Yes Take 1-2 tablets by mouth every 6 (six) hours as needed for headache. Vladimir Crofts, MD Taking Active   cholecalciferol (VITAMIN D) 1000 units tablet [id number] Yes Take 1,000 Units by mouth 2 (two)  times daily.  [provider] Taking Active Multiple Informants  fluticasone (FLONASE) 50 MCG/ACT nasal spray [id number] Yes Place 2 sprays into both nostrils daily. Delsa Grana, PA-C Taking Active   glucose blood (ONE TOUCH ULTRA TEST) test strip [id number] Yes CHECK FINGERSTICK BLOOD SUGARS THREE TIMES A DAY, LON 99 MONTHS, for ICD-10 E11.65, E11.21, Z79.4 Delsa Grana, PA-C Taking Active   HUMALOG KWIKPEN 100 UNIT/ML KwikPen [id number] Yes INJECT 10 UNITS TOTAL INTO THE SKIN 3 (THREE) TIMES DAILY. Delsa Grana, PA-C Taking Active   Insulin Glargine (BASAGLAR KWIKPEN) 100 UNIT/ML [id number] Yes Inject 60 Units into the skin at bedtime. [address] [place], [place], PA-C Taking Active   Insulin Pen Needle (B-D UF III MINI PEN NEEDLES) 31G X 5 MM MISC [id number] Yes Use four a day to inject insulin. Trinna Post, PA-C Taking Active   JARDIANCE 25 MG TABS tablet [id number] Yes TAKE 1 TABLET BY MOUTH DAILY. (HIGHER DOSE) Delsa Grana, PA-C Taking Active   Lancets [facility] ULTRASOFT) lancets [id number] Yes Check fingerstick blood sugar three times a day; For IDDM, uncontrolled, ICD-10 E11.65, E11.21, Z79.4, LON 99 months Delsa Grana, PA-C Taking Active   losartan (COZAAR) 100 MG tablet [id number] Yes  Take 1 tablet (100 mg total) by mouth daily. 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   Tinea pedis 06/25/2016   Hx of pulmonary embolus 05/18/2016   Essential hypertension, benign 12/23/2015   Hyperlipidemia 12/23/2015   Morbid obesity ([place]) 12/23/2015   Type 2 diabetes mellitus with diabetic nephropathy, 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[place]) 01/25/2015    Immunization History  Administered Date(s) Administered   Fluad Quad(high Dose 65+) 04/14/2019, 04/29/2020   Influenza, High Dose Seasonal PF 05/30/2015, 04/30/2017, 05/29/2018   Influenza-Unspecified 04/25/2016   Moderna Sars-Covid-2 Vaccination 09/18/2019, 10/16/2019, 07/05/2020   Pneumococcal Conjugate-13 10/08/2013   Pneumococcal Polysaccharide-23 08/26/2017   Pneumococcal-Unspecified 04/17/2010   Td 05/13/2009   Zoster, Live 10/11/2013    Conditions to be addressed/monitored:  Hypertension, Hyperlipidemia, Diabetes and Coronary Artery Disease  Care Plan : General Pharmacy (Adult)  Updates made by Germaine Pomfret, RPH since 03/22/2021 12:00 AM     Problem: Hypertension, Hyperlipidemia, Diabetes and Coronary Artery Disease   Priority: High     Long-Range Goal: Patient-Specific Goal   Start Date: 10/26/2020  Expected End Date: 03/22/2022  This Visit's Progress: On track  Recent Progress: On track  Priority: High  Note:   Current Barriers:  Unable to independently afford treatment regimen  Pharmacist Clinical Goal(s):  Over  the next 90 days, patient will verbalize ability to afford treatment regimen maintain control of diabetes as evidenced by A1c less than 8%  through collaboration with PharmD and provider.   Interventions: 1:1 collaboration with Delsa Grana, PA-C regarding development and update of comprehensive plan of care as evidenced by provider attestation and co-signature Inter-disciplinary care team collaboration (see longitudinal plan of care) Comprehensive medication review performed; medication list updated in electronic medical record  Hypertension (BP  goal <140/90) -Controlled -Current treatment: Amlodipine 10 mg daily  Losartan 100 mg daily  -Medications previously tried: NA  -Current home readings: NA -Denies hypotensive/hypertensive symptoms -Educated on Importance of home blood pressure monitoring; -Counseled to monitor BP at home weekly, document, and provide log at future appointments -Recommended to continue current medication  Hyperlipidemia: (LDL goal < 70) -Controlled -Current treatment: Atorvastatin 40 mg daily  -Medications previously tried: NA  -Educated on Importance of limiting foods high in cholesterol; -Recommended to continue current medication  Diabetes (A1c goal <8%) -Controlled -Current medications: Jardiance 25 mg daily  Humalog 10 units three times daily  Basaglar 60 units nightly  -Medications previously tried: NA  -Current home glucose readings: 140-175 is typical reported range.   -Denies hypoglycemic/hyperglycemic symptoms. Does report one instance of hypoglycemia in June/July when he got busy with work and didn't eat.  Prevention and management of hypoglycemic episodes; -Patient reports needing [facility]  -Patient interested in CGM Sensor if covered under insurance.  -Recommend continuing current medications.   Patient Goals/Self-Care Activities Over the next 90 days, patient will:  - check glucose four times daily: before meals  and at bedtime , document, and provide at future appointments check blood pressure weekly, document, and provide at future appointments  Follow Up Plan: Telephone follow up appointment with care management team member scheduled for:  09/20/2021 at 9:00 AM     Medication Assistance:  Jardiance, Humalog, Basaglar obtained through Multiple medication assistance program.  Enrollment ends Dec 2022  Patient's preferred pharmacy is:  [id number] [address] [place] [id number]: [phone]: [phone] Uses pill box? Yes Pt endorses 100% compliance  We discussed: Current pharmacy is preferred with insurance plan and patient is satisfied with pharmacy services Patient decided to: Continue current medication management strategy  Care Plan and Follow Up Patient Decision:  Patient agrees to Care Plan and Follow-up.  Plan: Telephone follow up appointment with care management team member scheduled for:  09/20/2021 at 9:00 AM  ADoristine Section [phone]

## 2021-03-22 NOTE — Addendum Note (Signed)
Addended by: Daron Offer A on: 03/22/2021 11:14 AM   Modules accepted: Orders

## 2021-03-22 NOTE — Patient Instructions (Signed)
Patient presented for foam casting for 3 pair custom diabetic shoe inserts. Patient is measured with a brannock device to be a size 13 wide  Diabetic shoes are chosen from the safe step catalog  The shoes chosen are 510  The patient will be contacted when the shoes and inserts are ready to be picked up.

## 2021-04-12 ENCOUNTER — Other Ambulatory Visit: Payer: Self-pay | Admitting: Family Medicine

## 2021-04-12 DIAGNOSIS — E1121 Type 2 diabetes mellitus with diabetic nephropathy: Secondary | ICD-10-CM

## 2021-04-24 ENCOUNTER — Other Ambulatory Visit: Payer: Self-pay

## 2021-04-24 ENCOUNTER — Ambulatory Visit (INDEPENDENT_AMBULATORY_CARE_PROVIDER_SITE_OTHER): Payer: Medicare PPO

## 2021-04-24 DIAGNOSIS — Z23 Encounter for immunization: Secondary | ICD-10-CM | POA: Diagnosis not present

## 2021-04-28 ENCOUNTER — Telehealth: Payer: Self-pay

## 2021-04-28 NOTE — Progress Notes (Signed)
Chronic Care Management Pharmacy Assistant   Name: Edgar [name]  MRN: [id number] DOB: July 09, 1946  Reason for Encounter: Hypertension and HLD Disease State Call   Recent office visits:  None ID   Recent consult visits:  03/22/2021 [name], DPM (Podiatry) for Type II DM-  No medication changes noted; no orders placed; no follow-up noted  Hospital visits:  None in previous 6 months  Medications: Outpatient Encounter Medications as of 04/28/2021  Medication Sig Note   Accu-Chek Softclix Lancets lancets USE AS INSTRUCTED DX ELL.21, CHECK BG TWICE A DAY    acetaminophen (TYLENOL) 500 MG tablet Take 500 mg by mouth every 6 (six) hours as needed. 12/07/2016: PRN   amLODipine (NORVASC) 10 MG tablet TAKE 1 TABLET BY MOUTH EVERY DAY    aspirin EC 81 MG tablet Take 81 mg by mouth daily. Swallow whole.    atorvastatin (LIPITOR) 40 MG tablet TAKE 1 TABLET BY MOUTH EVERYDAY AT BEDTIME    Blood Glucose Monitoring Suppl (ONE TOUCH ULTRA 2) w/Device KIT Check fingerstick blood sugars three times a day; DX E11.65, LON 99 months    butalbital-acetaminophen-caffeine (FIORICET) 50-325-40 MG tablet Take 1-2 tablets by mouth every 6 (six) hours as needed for headache.    cholecalciferol (VITAMIN D) 1000 units tablet Take 1,000 Units by mouth 2 (two) times daily.     empagliflozin (JARDIANCE) 25 MG TABS tablet Take 1 tablet (25 mg total) by mouth daily.    fluticasone (FLONASE) 50 MCG/ACT nasal spray Place 2 sprays into both nostrils daily.    glucose blood (ONE TOUCH ULTRA TEST) test strip CHECK FINGERSTICK BLOOD SUGARS THREE TIMES A DAY, LON 99 MONTHS, for ICD-10 E11.65, E11.21, Z79.4    HUMALOG KWIKPEN 100 UNIT/ML KwikPen INJECT 10 UNITS TOTAL INTO THE SKIN 3 (THREE) TIMES DAILY.    Insulin Glargine (BASAGLAR KWIKPEN) 100 UNIT/ML Inject 60 Units into the skin at bedtime. [employer]    Insulin Pen Needle (B-D UF III MINI PEN NEEDLES) 31G X 5 MM MISC Use four a day to inject insulin.    losartan  (COZAAR) 100 MG tablet Take 1 tablet (100 mg total) by mouth daily.    No facility-administered encounter medications on file as of 04/28/2021.   Care Gaps: Zoster Vaccines COVID-19 Vaccine Booster 4   Star Rating Drugs: Jardiance 25 mg  no fill history.Marland Kitchen PAP was done but patient was denied due to income. Patient recently lost his spouse and his income changed. Daughter contacted and I requested her to provide updated financial information but when I reached out to [facility] today that information has never been provided and according to CPP he never received it. The providers Office was suppose to also write a letter informing of this information and requesting an appeal.  Losartan 100 mg last filled on 10/24/2020 for a 90-Day supply with [facility] Atorvastatin 40 mg last filled on 07/30/2020 for a [facility]-Day supply [facility]  Reviewed chart prior to disease state call. Spoke with patient regarding BP  Recent Office Vitals: BP Readings from Last 3 Encounters:  02/16/21 124/72  01/17/21 130/76  01/05/21 112/68   Pulse Readings from Last 3 Encounters:  02/16/21 82  01/17/21 88  01/05/21 87    Wt Readings from Last 3 Encounters:  02/16/21 277 lb 8 oz (125.9 kg)  01/17/21 272 lb 3.2 oz (123.5 kg)  01/05/21 274 lb 8 oz (124.5 kg)     Kidney Function Lab Results  Component Value  Date/Time   CREATININE 0.90 11/12/2020 11:42 AM   CREATININE 1.04 10/28/2020 09:39 AM   CREATININE 1.06 08/01/2020 09:46 AM   GFRNONAA >60 11/12/2020 11:42 AM   GFRNONAA 69 08/01/2020 09:46 AM   GFRAA 80 08/01/2020 09:46 AM    BMP Latest Ref Rng & Units 11/12/2020 10/28/2020 08/01/2020  Glucose 70 - 99 mg/dL 76 120(H) 133(H)  BUN 8 - 23 mg/dL 13 14 14   Creatinine 0.61 - 1.24 mg/dL 0.90 1.04 1.06  BUN/Creat Ratio 6 - 22 (calc) - NOT APPLICABLE NOT APPLICABLE  Sodium 629 - 145 mmol/L 141 140 138  Potassium 3.5 - 5.1 mmol/L 3.9 4.5 4.2  Chloride 98 - 111 mmol/L 110 106 105  CO2 22 - 32  mmol/L 25 25 24   Calcium 8.9 - 10.3 mg/dL 9.6 10.0 10.0    Current antihypertensive regimen:  Losartan 100 mg 1 tablet daily Amlodipine 10 mg 1 tablet daily  What recent interventions/DTPs have been made by any provider to improve Blood Pressure control since last CPP Visit: None ID  Any recent hospitalizations or ED visits since last visit with CPP? No   Adherence Review: Is the patient currently on ACE/ARB medication? Yes Does the patient have >5 day gap between last estimated fill dates? Yes  05/02/2021 Name: Edgar [name] MRN: [id number] DOB: [birth year] Edgar [name] is a 75 y.o. year old male who is a primary care patient of Delsa Grana, [place].  Comprehensive medication review performed; Spoke to patient regarding cholesterol  Lipid Panel    Component Value Date/Time   CHOL 118 10/28/2020 0939   CHOL 129 12/23/2015 0959   TRIG 74 10/28/2020 0939   HDL 38 (L) 10/28/2020 0939   HDL 35 (L) 12/23/2015 0959   LDLCALC 65 10/28/2020 0939    10-year ASCVD risk score: The ASCVD Risk score (Arnett DK, et al., 2019) failed to calculate for the following reasons:   The valid total cholesterol range is 130 to 320 mg/dL   09/27 LVM requesting patient return my call 10/03 LVM requesting patient to return my call 10/05 LVM requesting patient to return my call 10/14 LVM requesting patient to return my call   I have attempted without success to contact this patient by phone four times to do his monthly disease state call. I left a Voice messages for patient to return my call.   Lynann Bologna, CPA/CMA Clinical Pharmacist Assistant Phone: [phone]

## 2021-05-09 NOTE — Telephone Encounter (Signed)
Pt called to return call. He is requesting to have a call back. Please advise.

## 2021-05-22 ENCOUNTER — Telehealth: Payer: Self-pay

## 2021-05-22 NOTE — Progress Notes (Signed)
Chronic Care Management Pharmacy Assistant   Name: Edgar [name]  MRN: [id number] DOB: Dec 28, 1945  Reason for Encounter: Hypertension Disease State Call  Renewal Patient Assistance Applications for Jardiance, Humalog, Basaglar  I have a task to complete Patient Assistance Applications for the 9418 year for Jardiance, Humalog, and [place]. I have been trying to contact the patient with no success to also complete his Monthly Disease State Call. The patient has called me back three times. The first time patient called clinic, and a message was relayed and when I tried contacting the patient back I had to leave a voicemail. The other two times the patient did return my calls, but both times were after hours, and when I try calling back the next day I have to leave a voice message.   I tried calling patient today to speak to him about the renewal process, but I had to leave a message requesting he call me back I tried twice to call about 10 minutes apart.   Humalog and Basaglar renewal application will be started, and mailed to the patient for completion. Once patient has completed application he will return it to the office for CPP to fax to [facility] @ [phone].  Jardiance Application will be started if the patient has tried to apply for low income subsidy. [employer] criteria is that applicants have to complete the low income subsidy application prior to submitting the application to there foundation. If the patient is denied low income subsidy they at that time can present the denial letter along with the application for approval.   10/19 Spoke with the patient, and his daughter today, and went over the process with them both. The daughter requested that I e-mail the applications to [email or web address]. I informed them once they complete their portion of the application to please return them to the clinic, and have the front desk staff give them to [facility] for faxing.  They verbalized understanding to all.  Applications have been e-mailed per patient's request.    Recent office visits:  None ID    Recent consult visits:  03/22/2021 [name], DPM (Podiatry) for Type II DM-  No medication changes noted; no orders placed; no follow-up noted   Hospital visits:  None in previous 6 months  Medications: Outpatient Encounter Medications as of 05/22/2021  Medication Sig Note   Accu-Chek Softclix Lancets lancets USE AS INSTRUCTED DX ELL.21, CHECK BG TWICE A DAY    acetaminophen (TYLENOL) 500 MG tablet Take 500 mg by mouth every 6 (six) hours as needed. 12/07/2016: PRN   amLODipine (NORVASC) 10 MG tablet TAKE 1 TABLET BY MOUTH EVERY DAY    aspirin EC 81 MG tablet Take 81 mg by mouth daily. Swallow whole.    atorvastatin (LIPITOR) 40 MG tablet TAKE 1 TABLET BY MOUTH EVERYDAY AT BEDTIME    Blood Glucose Monitoring Suppl (ONE TOUCH ULTRA 2) w/Device KIT Check fingerstick blood sugars three times a day; DX E11.65, LON 99 months    butalbital-acetaminophen-caffeine (FIORICET) 50-325-40 MG tablet Take 1-2 tablets by mouth every 6 (six) hours as needed for headache.    cholecalciferol (VITAMIN D) 1000 units tablet Take 1,000 Units by mouth 2 (two) times daily.     empagliflozin (JARDIANCE) 25 MG TABS tablet Take 1 tablet (25 mg total) by mouth daily.    fluticasone (FLONASE) 50 MCG/ACT nasal spray Place 2 sprays into both nostrils daily.    glucose blood (ONE TOUCH ULTRA TEST) test strip  CHECK FINGERSTICK BLOOD SUGARS THREE TIMES A DAY, LON 99 MONTHS, for ICD-10 E11.65, E11.21, Z79.4    HUMALOG KWIKPEN 100 UNIT/ML KwikPen INJECT 10 UNITS TOTAL INTO THE SKIN 3 (THREE) TIMES DAILY.    Insulin Glargine (BASAGLAR KWIKPEN) 100 UNIT/ML Inject 60 Units into the skin at bedtime. Lilly Cares    Insulin Pen Needle (B-D UF III MINI PEN NEEDLES) 31G X 5 MM MISC Use four a day to inject insulin.    losartan (COZAAR) 100 MG tablet Take 1 tablet (100 mg total) by mouth daily.     No facility-administered encounter medications on file as of 05/22/2021.     Care Gaps: Zoster Vaccines COVID-19 Vaccine Booster 4    Star Rating Drugs: Jardiance 25 mg  no fill history.Marland Kitchen PAP was done but patient was denied due to income. Patient recently lost his spouse and his income changed. Daughter contacted and I requested her to provide updated financial information but when I reached out to [facility] today that information has never been provided and according to CPP he never received it. The providers Office was suppose to also write a letter informing of this information and requesting an appeal.  Losartan 100 mg last filled on 10/24/2020 for a 90-Day supply with [facility] Atorvastatin 40 mg last filled on 07/30/2020 for a 90-Day supply with [facility]   Reviewed chart prior to disease state call. Spoke with patient regarding BP  Recent Office Vitals: BP Readings from Last 3 Encounters:  02/16/21 124/72  01/17/21 130/76  01/05/21 112/68   Pulse Readings from Last 3 Encounters:  02/16/21 82  01/17/21 88  01/05/21 87    Wt Readings from Last 3 Encounters:  02/16/21 277 lb 8 oz (125.9 kg)  01/17/21 272 lb 3.2 oz (123.5 kg)  01/05/21 274 lb 8 oz (124.5 kg)     Kidney Function Lab Results  Component Value Date/Time   CREATININE 0.90 11/12/2020 11:42 AM   CREATININE 1.04 10/28/2020 09:39 AM   CREATININE 1.06 08/01/2020 09:46 AM   GFRNONAA >60 11/12/2020 11:42 AM   GFRNONAA 69 08/01/2020 09:46 AM   GFRAA 80 08/01/2020 09:46 AM    BMP Latest Ref Rng & Units 11/12/2020 10/28/2020 08/01/2020  Glucose 70 - 99 mg/dL 76 120(H) 133(H)  BUN 8 - 23 mg/dL 13 14 14   Creatinine 0.61 - 1.24 mg/dL 0.90 1.04 1.06  BUN/Creat Ratio 6 - 22 (calc) - NOT APPLICABLE NOT APPLICABLE  Sodium 675 - 145 mmol/L 141 140 138  Potassium 3.5 - 5.1 mmol/L 3.9 4.5 4.2  Chloride 98 - 111 mmol/L 110 106 105  CO2 22 - 32 mmol/L 25 25 24   Calcium 8.9 - 10.3 mg/dL 9.6 10.0 10.0     Current antihypertensive regimen:  Losartan 100 mg 1 tablet daily Amlodipine 10 mg 1 tablet daily Atorvastatin 40 mg 1 tablet daily  How often are you checking your Blood Pressure? infrequently  Current home BP readings: Patient was not home, and was unable to provide me was a blood pressure number. Patient stated that he never has issues with his blood pressure.   What recent interventions/DTPs have been made by any provider to improve Blood Pressure control since last CPP Visit: None iD  Any recent hospitalizations or ED visits since last visit with CPP? No  What diet changes have been made to improve Blood Pressure Control?  Patient does not follow in particular diet, and states he eats all he can. Patient reports he has a  very good appetite.   What exercise is being done to improve your Blood Pressure Control?  Patient states he works a lot. He keeps busy, and with all the work he does that is his exercise.   Adherence Review: Is the patient currently on ACE/ARB medication? Yes Does the patient have >5 day gap between last estimated fill dates? No   Lynann Bologna, CPA/CMA Clinical Pharmacist Assistant Phone: [phone]

## 2021-06-01 ENCOUNTER — Encounter: Payer: Self-pay | Admitting: Podiatry

## 2021-06-01 ENCOUNTER — Ambulatory Visit: Payer: Medicare PPO | Admitting: Podiatry

## 2021-06-01 ENCOUNTER — Other Ambulatory Visit: Payer: Self-pay

## 2021-06-01 DIAGNOSIS — B351 Tinea unguium: Secondary | ICD-10-CM

## 2021-06-01 DIAGNOSIS — E1121 Type 2 diabetes mellitus with diabetic nephropathy: Secondary | ICD-10-CM | POA: Diagnosis not present

## 2021-06-01 DIAGNOSIS — M2011 Hallux valgus (acquired), right foot: Secondary | ICD-10-CM | POA: Diagnosis not present

## 2021-06-01 DIAGNOSIS — Z794 Long term (current) use of insulin: Secondary | ICD-10-CM

## 2021-06-01 DIAGNOSIS — M79676 Pain in unspecified toe(s): Secondary | ICD-10-CM | POA: Diagnosis not present

## 2021-06-01 DIAGNOSIS — E1140 Type 2 diabetes mellitus with diabetic neuropathy, unspecified: Secondary | ICD-10-CM | POA: Diagnosis not present

## 2021-06-01 DIAGNOSIS — M2012 Hallux valgus (acquired), left foot: Secondary | ICD-10-CM | POA: Diagnosis not present

## 2021-06-01 NOTE — Progress Notes (Addendum)
This patient returns to my office for at risk foot care.  This patient requires this care by a professional since this patient will be at risk due to having  Diabetes with diabetic nephropathy.  This patient is unable to cut nails himself since the patient cannot reach his nails.These nails are painful walking and wearing shoes.  This patient presents for at risk foot care today.  General Appearance  Alert, conversant and in no acute stress.  Vascular  Dorsalis pedis and posterior tibial  pulses are palpable  bilaterally.  Capillary return is within normal limits  bilaterally. Temperature is within normal limits  bilaterally.  Neurologic  Senn-Weinstein monofilament wire test diminished  bilaterally. Muscle power within normal limits bilaterally.  Nails Thick disfigured discolored nails with subungual debris  from hallux to fifth toes bilaterally. No evidence of bacterial infection or drainage bilaterally.  Orthopedic  No limitations of motion  feet .  No crepitus or effusions noted.  No bony pathology or digital deformities noted.  HAV  B/L  Skin  normotropic skin with no porokeratosis noted bilaterally.  No signs of infections or ulcers noted.     Onychomycosis  Pain in right toes  Pain in left toes  Consent was obtained for treatment procedures.   Mechanical debridement of nails 1-5  bilaterally performed with a nail nipper.  Filed with dremel without incident.Dispensed diabetic shoes.Patient presents today and was dispensed 0ne pair ( two units) of medically necessary extra depth shoes with three pair( six units) of custom molded multiple density inserts. The shoes and the inserts are fitted to the patients ' feet and are noted to fit well and are free of defect.  Length and width of the shoes are also acceptable.  Patient was given written and verbal  instructions for wearing.  If any concerns arrive with the shoes or inserts, the patient is to call the office.Patient is to follow up with doctor  in six weeks.    Return office visit   3 months                   Told patient to return for periodic foot care and evaluation due to potential at risk complications.   [name] DPM

## 2021-06-10 ENCOUNTER — Other Ambulatory Visit: Payer: Self-pay | Admitting: Family Medicine

## 2021-06-10 DIAGNOSIS — E782 Mixed hyperlipidemia: Secondary | ICD-10-CM

## 2021-06-10 NOTE — Telephone Encounter (Signed)
Requested Prescriptions  Pending Prescriptions Disp Refills  . atorvastatin (LIPITOR) 40 MG tablet [Pharmacy Med Name: ATORVASTATIN 40 MG TABLET] 90 tablet 3    Sig: TAKE 1 TABLET BY MOUTH EVERYDAY AT BEDTIME     Cardiovascular:  Antilipid - Statins Failed - 06/10/2021 12:46 AM      Failed - HDL in normal range and within 360 days    HDL  Date Value Ref Range Status  10/28/2020 38 (L) > OR = 40 mg/dL Final  12/23/2015 35 (L) >39 mg/dL Final         Passed - Total Cholesterol in normal range and within 360 days    Cholesterol, Total  Date Value Ref Range Status  12/23/2015 129 100 - 199 mg/dL Final   Cholesterol  Date Value Ref Range Status  10/28/2020 118 <200 mg/dL Final         Passed - LDL in normal range and within 360 days    LDL Cholesterol (Calc)  Date Value Ref Range Status  10/28/2020 65 mg/dL (calc) Final    Comment:    Reference range: <100 . Desirable range <100 mg/dL for primary prevention;   <70 mg/dL for patients with CHD or diabetic patients  with > or = 2 CHD risk factors. Marland Kitchen LDL-C is now calculated using the Martin-Hopkins  calculation, which is a validated novel method providing  better accuracy than the Friedewald equation in the  estimation of LDL-C.  Cresenciano Genre et al. Annamaria Helling. 0034;917(91): 2061-2068  (http://education.Quest[employer].com/faq/FAQ164)          Passed - Triglycerides in normal range and within 360 days    Triglycerides  Date Value Ref Range Status  10/28/2020 74 <150 mg/dL Final         Passed - Patient is not pregnant      Passed - Valid encounter within last 12 months    Recent Outpatient Visits          3 months ago Type 2 diabetes mellitus with diabetic nephropathy, 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[place])   [facility] [place], Malachy Mood, NP   4 months ago Nodule of skin of lower extremity   [facility] [place], Malachy Mood, NP   6 months ago Nonintractable headache, unspecified chronicity  pattern, unspecified headache type   [place], PA-C   7 months ago Type 2 diabetes mellitus with diabetic nephropathy, with long-term current use of insulin [facility])   [place], Adriana M, [place]   10 months ago Type 2 diabetes mellitus with diabetic nephropathy, with long-term current use of insulin [facility])   [facility] Delsa Grana, PA-C      Future Appointments            In 2 months Ralene Bathe, MD [place]   In 2 months Delsa Grana, PA-C [facility], [place]   In 7 months  [place]

## 2021-06-14 ENCOUNTER — Ambulatory Visit (INDEPENDENT_AMBULATORY_CARE_PROVIDER_SITE_OTHER): Payer: Medicare PPO

## 2021-06-14 DIAGNOSIS — E1121 Type 2 diabetes mellitus with diabetic nephropathy: Secondary | ICD-10-CM

## 2021-06-14 NOTE — Progress Notes (Signed)
[employer] Patient Assistance form for [employer], Humalog completed by patient and faxed for review on 06/14/21   [facility] Cares Patient Assistance form for Jardiance completed by patient and faxed for review on 06/14/21

## 2021-06-23 ENCOUNTER — Telehealth: Payer: Self-pay

## 2021-06-23 DIAGNOSIS — I10 Essential (primary) hypertension: Secondary | ICD-10-CM

## 2021-06-23 NOTE — Progress Notes (Signed)
Chronic Care Management Pharmacy Assistant   Name: Edgar [name]  MRN: [id number] DOB: 12-27-1945  Reason for Encounter: Hypertension  Disease State Call/ PAP Status  I reached out to [facility] Regarding patient's renewal application for his Humalog & Basaglar. I wanted to makes sure that they received the renewal application that was faxed to them on 06/14/2021. I spoke with the representative Aniceto Boss, and she advised  that the patient's application has been received and approved for 2023. The application is approved starting 08/06/2021, and goes to 08/05/2022. The patient is on automatic refills, and they will reach out to the patient for the 1st shipment.   I reached out to [facility] cares regarding the patient's application for his Jardiance. I spoke with a representative who confirmed they received the patient's application, and stated it appears they have everything they need. The representative stated that they could process his application by this weekend, and I can check back next week if I would like to check status.   Recent office visits:  None ID  Recent consult visits:  06/01/2021 [name], DPM (Podiatry) for Nail Problem- No medication changes noted, no orders placed, patient instructed to return in 3 months.  Hospital visits:  None in previous 6 months  Medications: Outpatient Encounter Medications as of 06/23/2021  Medication Sig Note   Accu-Chek Softclix Lancets lancets USE AS INSTRUCTED DX ELL.21, CHECK BG TWICE A DAY    acetaminophen (TYLENOL) 500 MG tablet Take 500 mg by mouth every 6 (six) hours as needed. 12/07/2016: PRN   amLODipine (NORVASC) 10 MG tablet TAKE 1 TABLET BY MOUTH EVERY DAY    aspirin EC 81 MG tablet Take 81 mg by mouth daily. Swallow whole.    atorvastatin (LIPITOR) 40 MG tablet TAKE 1 TABLET BY MOUTH EVERYDAY AT BEDTIME    Blood Glucose Monitoring Suppl (ONE TOUCH ULTRA 2) w/Device KIT Check fingerstick blood sugars three times a day;  DX E11.65, LON 99 months    butalbital-acetaminophen-caffeine (FIORICET) 50-325-40 MG tablet Take 1-2 tablets by mouth every 6 (six) hours as needed for headache.    cholecalciferol (VITAMIN D) 1000 units tablet Take 1,000 Units by mouth 2 (two) times daily.     empagliflozin (JARDIANCE) 25 MG TABS tablet Take 1 tablet (25 mg total) by mouth daily.    fluticasone (FLONASE) 50 MCG/ACT nasal spray Place 2 sprays into both nostrils daily.    glucose blood (ONE TOUCH ULTRA TEST) test strip CHECK FINGERSTICK BLOOD SUGARS THREE TIMES A DAY, LON 99 MONTHS, for ICD-10 E11.65, E11.21, Z79.4    HUMALOG KWIKPEN 100 UNIT/ML KwikPen INJECT 10 UNITS TOTAL INTO THE SKIN 3 (THREE) TIMES DAILY.    Insulin Glargine (BASAGLAR KWIKPEN) 100 UNIT/ML Inject 60 Units into the skin at bedtime. Lilly Cares    Insulin Pen Needle (B-D UF III MINI PEN NEEDLES) 31G X 5 MM MISC Use four a day to inject insulin.    losartan (COZAAR) 100 MG tablet Take 1 tablet (100 mg total) by mouth daily.    No facility-administered encounter medications on file as of 06/23/2021.   Care Gaps: Zoster Vaccine COVID-19 Booster 4  Star Rating Drugs: Atorvastatin 40 mg last filled on 06/10/2021 for a [facility]-Day supply  [facility]  Jardiance 25 mg last filled on 09/24/2020 for a 90-Day supply [facility]- Patient is waiting on PAP to be approved due to cost of this medication Losartan 100 mg last filled on 04/05/2021 for  a 90-Day supply with [facility]-No refills left  [facility] patient has been refilling his medication, but Epic is not reflecting this.   Reviewed chart prior to disease state call. Spoke with patient regarding BP  Recent Office Vitals: BP Readings from Last 3 Encounters:  02/16/21 124/72  01/17/21 130/76  01/05/21 112/68   Pulse Readings from Last 3 Encounters:  02/16/21 82  01/17/21 88  01/05/21 87    Wt Readings from Last 3 Encounters:  02/16/21 277 lb 8 oz (125.9 kg)  01/17/21 272 lb 3.2 oz  (123.5 kg)  01/05/21 274 lb 8 oz (124.5 kg)     Kidney Function Lab Results  Component Value Date/Time   CREATININE 0.90 11/12/2020 11:42 AM   CREATININE 1.04 10/28/2020 09:39 AM   CREATININE 1.06 08/01/2020 09:46 AM   GFRNONAA >60 11/12/2020 11:42 AM   GFRNONAA 69 08/01/2020 09:46 AM   GFRAA 80 08/01/2020 09:46 AM    BMP Latest Ref Rng & Units 11/12/2020 10/28/2020 08/01/2020  Glucose 70 - 99 mg/dL 76 120(H) 133(H)  BUN 8 - 23 mg/dL 13 14 14   Creatinine 0.61 - 1.24 mg/dL 0.90 1.04 1.06  BUN/Creat Ratio 6 - 22 (calc) - NOT APPLICABLE NOT APPLICABLE  Sodium 774 - 145 mmol/L 141 140 138  Potassium 3.5 - 5.1 mmol/L 3.9 4.5 4.2  Chloride 98 - 111 mmol/L 110 106 105  CO2 22 - 32 mmol/L 25 25 24   Calcium 8.9 - 10.3 mg/dL 9.6 10.0 10.0    Current antihypertensive regimen:  Losartan 100 mg 1 tablet daily Amlodipine 10 mg 1 tablet daily   How often are you checking your Blood Pressure? infrequently  Current home BP readings: Hasn't checked blood pressure recently   What recent interventions/DTPs have been made by any provider to improve Blood Pressure control since last CPP Visit: None ID  Any recent hospitalizations or ED visits since last visit with CPP? No  What diet changes have been made to improve Blood Pressure Control?  Patient eats a regular normal diet. Patient stated nothing has changed since last month  What exercise is being done to improve your Blood Pressure Control?  Patient stated he is still keeping busy and working  Adherence Review: Is the patient currently on ACE/ARB medication? Yes Does the patient have >5 day gap between last estimated fill dates? Yes  Patient, and daughter aware of patient's approval for his Humalog and Basaglar for the 2023 year. I advised them that [employer] would be reaching out to them to confirm shipment for January. I also informed them no decision has been made regarding Jardinace, but I would check back next week. Patient's  daughter stated over all patient is doing well like patient states he is, but she does admit since his spouse passed in February he has been eating a few bad things here and there. Patient's daughter stated she had to take a bag of cookies from him last week. There are really no concerns or issues at this time. Patient is taking his medications as directed. There is one refill needed, and CPP has been notified and will send it to the patient's pharmacy.  Patient has telephone appointment with [name], CPP on 09/20/2021 @ 0900   Lynann Bologna, CPA/CMA Clinical Pharmacist Assistant Phone: [phone]

## 2021-06-27 MED ORDER — LOSARTAN POTASSIUM 100 MG PO TABS: 100.0000 mg | ORAL_TABLET | Freq: Every day | ORAL | 1 refills | Status: DC

## 2021-06-27 NOTE — Addendum Note (Signed)
Addended by: Daron Offer A on: 06/27/2021 01:12 PM   Modules accepted: Orders

## 2021-07-04 ENCOUNTER — Telehealth: Payer: Self-pay

## 2021-07-04 NOTE — Progress Notes (Signed)
Chronic Care Management Pharmacy Assistant   Name: Edgar [name]  MRN: [id number] DOB: 02-23-1946  Patient Assistance Application Follow-up  I received a message from the patient's daughter advising that the patient's order for his Nancee Liter was cancelled via [employer] due to an expired prescription.   I reached out to [employer] to double check the information as we recently submitted a renewal application for this medication for the 2023 year which should have also included a new prescription.   I spoke with Wells Guiles, and she advised that they do have a new prescription for both the Basaglar and the Humalog. She stated that she see where the new prescription for the Humalog was sent to the pharmacy, but for some reason the new prescription for the Basaglar was not sent over. Wells Guiles placed me on a 3 minute hold so she could send the new prescription for the patient's Basaglar over to the pharmacy so the shipment can be reprocessed for the patient. Wells Guiles advised she would also expedite the order for the patient due to this issue.   I also reached out to [facility] regarding the patient's renewal application for the patient's Jardiance. I spoke with the representative whom advised the patient has been approved for his Vania Rea as of today, and he is approved until 08/05/2022. The representative was requested first shipment of patient's medication to be sent out as of this Friday.   I informed the patient's daughter [name] that this has all been taken care of, and that they should receive his shipment of [place], and his shipment of Jardiance. I advised them to contact me if there is no medication received by next week.  Medications: Outpatient Encounter Medications as of 07/04/2021  Medication Sig Note   Accu-Chek Softclix Lancets lancets USE AS INSTRUCTED DX ELL.21, CHECK BG TWICE A DAY    acetaminophen (TYLENOL) 500 MG tablet Take 500 mg by mouth every 6 (six) hours as needed.  12/07/2016: PRN   amLODipine (NORVASC) 10 MG tablet TAKE 1 TABLET BY MOUTH EVERY DAY    aspirin EC 81 MG tablet Take 81 mg by mouth daily. Swallow whole.    atorvastatin (LIPITOR) 40 MG tablet TAKE 1 TABLET BY MOUTH EVERYDAY AT BEDTIME    Blood Glucose Monitoring Suppl (ONE TOUCH ULTRA 2) w/Device KIT Check fingerstick blood sugars three times a day; DX E11.65, LON 99 months    butalbital-acetaminophen-caffeine (FIORICET) 50-325-40 MG tablet Take 1-2 tablets by mouth every 6 (six) hours as needed for headache.    cholecalciferol (VITAMIN D) 1000 units tablet Take 1,000 Units by mouth 2 (two) times daily.     empagliflozin (JARDIANCE) 25 MG TABS tablet Take 1 tablet (25 mg total) by mouth daily.    fluticasone (FLONASE) 50 MCG/ACT nasal spray Place 2 sprays into both nostrils daily.    glucose blood (ONE TOUCH ULTRA TEST) test strip CHECK FINGERSTICK BLOOD SUGARS THREE TIMES A DAY, LON 99 MONTHS, for ICD-10 E11.65, E11.21, Z79.4    HUMALOG KWIKPEN 100 UNIT/ML KwikPen INJECT 10 UNITS TOTAL INTO THE SKIN 3 (THREE) TIMES DAILY.    Insulin Glargine (BASAGLAR KWIKPEN) 100 UNIT/ML Inject 60 Units into the skin at bedtime. Lilly Cares    Insulin Pen Needle (B-D UF III MINI PEN NEEDLES) 31G X 5 MM MISC Use four a day to inject insulin.    losartan (COZAAR) 100 MG tablet Take 1 tablet (100 mg total) by mouth daily.    No facility-administered encounter medications on  file as of 07/04/2021.    Lynann Bologna, CPA/CMA Clinical Pharmacist Assistant Phone: [phone]

## 2021-07-05 ENCOUNTER — Other Ambulatory Visit: Payer: Self-pay

## 2021-07-05 DIAGNOSIS — E1121 Type 2 diabetes mellitus with diabetic nephropathy: Secondary | ICD-10-CM

## 2021-07-05 DIAGNOSIS — Z794 Long term (current) use of insulin: Secondary | ICD-10-CM

## 2021-08-14 ENCOUNTER — Encounter: Payer: Self-pay | Admitting: Dermatology

## 2021-08-14 ENCOUNTER — Ambulatory Visit: Payer: Medicare PPO | Admitting: Dermatology

## 2021-08-14 ENCOUNTER — Other Ambulatory Visit: Payer: Self-pay

## 2021-08-14 DIAGNOSIS — D485 Neoplasm of uncertain behavior of skin: Secondary | ICD-10-CM

## 2021-08-14 DIAGNOSIS — D099 Carcinoma in situ, unspecified: Secondary | ICD-10-CM

## 2021-08-14 DIAGNOSIS — D0472 Carcinoma in situ of skin of left lower limb, including hip: Secondary | ICD-10-CM | POA: Diagnosis not present

## 2021-08-14 DIAGNOSIS — L821 Other seborrheic keratosis: Secondary | ICD-10-CM | POA: Diagnosis not present

## 2021-08-14 DIAGNOSIS — C4492 Squamous cell carcinoma of skin, unspecified: Secondary | ICD-10-CM

## 2021-08-14 HISTORY — DX: Carcinoma in situ, unspecified: D09.9

## 2021-08-14 HISTORY — DX: Squamous cell carcinoma of skin, unspecified: C44.92

## 2021-08-14 NOTE — Progress Notes (Signed)
° °  New Patient Visit  Subjective  Edgar [name] is a 76 y.o. male who presents for the following: Irregular skin lesion (On the L upper thigh - has been there for about a year - scabbed, crusted, makes entire leg sore.). The patient has spots, moles and lesions to be evaluated, some may be new or changing and the patient has concerns that these could be cancer.  The following portions of the chart were reviewed this encounter and updated as appropriate:   Tobacco   Allergies   Meds   Problems   Med Hx   Surg Hx   Fam Hx      Review of Systems:  No other skin or systemic complaints except as noted in HPI or Assessment and Plan.  Objective  Well appearing patient in no apparent distress; mood and affect are within normal limits.  A focused examination was performed including the legs. Relevant physical exam findings are noted in the Assessment and Plan.  L ant lat thigh Crusted plaque 3.5 x 2.5 cm       Assessment & Plan  Neoplasm of uncertain behavior of skin L ant lat thigh  Skin / nail biopsy Type of biopsy: tangential   Informed consent: discussed and consent obtained   Timeout: patient name, date of birth, surgical site, and procedure verified   Procedure prep:  Patient was prepped and draped in usual sterile fashion Prep type:  Isopropyl alcohol Anesthesia: the lesion was anesthetized in a standard fashion   Anesthetic:  1% lidocaine w/ epinephrine 1-100,000 buffered w/ 8.4% NaHCO3 Instrument used: flexible razor blade   Hemostasis achieved with: pressure, aluminum chloride and electrodesiccation   Outcome: patient tolerated procedure well   Post-procedure details: sterile dressing applied and wound care instructions given   Dressing type: bandage and petrolatum    Specimen 1 - Surgical pathology Differential Diagnosis: D48.5 r/o ISK vs SCC vs other  Check Margins: No  Seborrheic Keratoses - Stuck-on, waxy, tan-brown papules and/or plaques  - Benign-appearing -  Discussed benign etiology and prognosis. - Observe - Call for any changes  Call Deontaye Civello (patient's daughter) to discuss pathology results.  Return for follow up pending pathology results.  Luther Redo, CMA, am acting as scribe for Sarina Ser, MD . Documentation: I have reviewed the above documentation for accuracy and completeness, and I agree with the above.  Sarina Ser, MD

## 2021-08-14 NOTE — Patient Instructions (Addendum)

## 2021-08-14 NOTE — Telephone Encounter (Signed)
Called pt and his daughter Edgar [name], discussed biopsy results are not back, he can check Mychart in 3-4 days for results

## 2021-08-15 ENCOUNTER — Telehealth: Payer: Self-pay

## 2021-08-15 ENCOUNTER — Encounter: Payer: Self-pay | Admitting: Dermatology

## 2021-08-15 NOTE — Telephone Encounter (Signed)
Discussed biopsy results with pt  °

## 2021-08-15 NOTE — Telephone Encounter (Signed)
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

## 2021-08-21 ENCOUNTER — Ambulatory Visit: Payer: Medicare PPO | Admitting: Family Medicine

## 2021-08-23 ENCOUNTER — Encounter: Payer: Self-pay | Admitting: Nurse Practitioner

## 2021-08-23 ENCOUNTER — Ambulatory Visit: Payer: Medicare PPO | Admitting: Nurse Practitioner

## 2021-08-23 VITALS — BP 128/76 | HR 81 | Temp 98.8°F | Resp 16 | Ht 73.0 in | Wt 279.2 lb

## 2021-08-23 DIAGNOSIS — E1121 Type 2 diabetes mellitus with diabetic nephropathy: Secondary | ICD-10-CM

## 2021-08-23 DIAGNOSIS — E782 Mixed hyperlipidemia: Secondary | ICD-10-CM

## 2021-08-23 DIAGNOSIS — J310 Chronic rhinitis: Secondary | ICD-10-CM

## 2021-08-23 DIAGNOSIS — I7121 Aneurysm of the ascending aorta, without rupture: Secondary | ICD-10-CM

## 2021-08-23 DIAGNOSIS — Z794 Long term (current) use of insulin: Secondary | ICD-10-CM | POA: Diagnosis not present

## 2021-08-23 DIAGNOSIS — I700 Atherosclerosis of aorta: Secondary | ICD-10-CM | POA: Diagnosis not present

## 2021-08-23 DIAGNOSIS — I10 Essential (primary) hypertension: Secondary | ICD-10-CM

## 2021-08-23 MED ORDER — ATORVASTATIN CALCIUM 40 MG PO TABS: ORAL_TABLET | ORAL | 3 refills | Status: DC

## 2021-08-23 MED ORDER — FLUTICASONE PROPIONATE 50 MCG/ACT NA SUSP: 2.0000 | Freq: Every day | NASAL | 6 refills | Status: DC

## 2021-08-23 MED ORDER — EMPAGLIFLOZIN 25 MG PO TABS: 25.0000 mg | ORAL_TABLET | Freq: Every day | ORAL | 3 refills | Status: DC

## 2021-08-23 MED ORDER — AMLODIPINE BESYLATE 10 MG PO TABS: 10.0000 mg | ORAL_TABLET | Freq: Every day | ORAL | 3 refills | Status: DC

## 2021-08-23 MED ORDER — LOSARTAN POTASSIUM 100 MG PO TABS: 100.0000 mg | ORAL_TABLET | Freq: Every day | ORAL | 1 refills | Status: DC

## 2021-08-23 NOTE — Progress Notes (Signed)
BP 128/76    Pulse 81    Temp 98.8 F (37.1 C)    Resp 16    Ht 6\' 1"  (1.854 m)    Wt 279 lb 3.2 oz (126.6 kg)    SpO2 96%    BMI 36.84 kg/m    Subjective:    Patient ID: Edgar [name], male    DOB: 11/21/1945, 76 y.o.   MRN: [id number]  HPI: Edgar [name] is a 76 y.o. male  Chief Complaint  Patient presents with   Hypertension   Diabetes   Hyperlipidemia    6 month follow up   HTN: He does not check his blood at home.  He denies any chest pain, shortness of breath, headaches or blurred vision. He is currently taking losartan 100 mg, amlodipine 10 mg daily.    Diabetes: He says his fasting blood sugar was 138 this morning. He says that is normally where it runs.  He is on jardiance 25 mg daily, humalog 10 units three times a day and basaglar 60 units at night. He denies any polyuria, polydipsia or polyphagia.   Hyperlipidemia: He is currently taking atorvastatin 40 mg daily.  He denies any myalgia.  His last LDL was 65 on 10/28/20.  Will get labs today.  Aortic atherosclerosis: He is currently on atorvastatin 40 mg daily  Aortic aneurysm:  He last had a ct of his chest  in 12/02/2019. Ordered by Dr Dian Situ.  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 Recommend annual imaging followup by CTA or MRA. Discussed with patient to follow up with Dr. Dian Situ vascular surgeon.  Gave him the information for his office.    Relevant past medical, surgical, family and social history reviewed and updated as indicated. Interim medical history since our last visit reviewed. Allergies and medications reviewed and updated.  Review of Systems  Constitutional: Negative for fever or weight change.  Respiratory: Negative for cough and shortness of breath.   Cardiovascular: Negative for chest pain or palpitations.  Gastrointestinal: Negative for abdominal pain, no bowel changes.  Musculoskeletal: Negative for gait problem or joint swelling.  Skin: Negative for rash.  Neurological:  Negative for dizziness or headache.  No other specific complaints in a complete review of systems (except as listed in HPI above).      Objective:    BP 128/76    Pulse 81    Temp 98.8 F (37.1 C)    Resp 16    Ht 6\' 1"  (1.854 m)    Wt 279 lb 3.2 oz (126.6 kg)    SpO2 96%    BMI 36.84 kg/m   Wt Readings from Last 3 Encounters:  08/23/21 279 lb 3.2 oz (126.6 kg)  02/16/21 277 lb 8 oz (125.9 kg)  01/17/21 272 lb 3.2 oz (123.5 kg)    Physical Exam  Constitutional: Patient appears well-developed and well-nourished. Obese  No distress.  HEENT: head atraumatic, normocephalic, pupils equal and reactive to light, neck supple, throat within normal limits Cardiovascular: Normal rate, regular rhythm and normal heart sounds.  No murmur heard. No BLE edema. Pulmonary/Chest: Effort normal and breath sounds normal. No respiratory distress. Abdominal: Soft.  There is no tenderness. Psychiatric: Patient has a normal mood and affect. behavior is normal. Judgment and thought content normal.   Diabetic Foot Exam - Simple   Simple Foot Form Diabetic Foot exam was performed with the following findings: Yes 08/23/2021  1:33 PM  Visual Inspection No deformities, no  ulcerations, no other skin breakdown bilaterally: Yes Sensation Testing Intact to touch and monofilament testing bilaterally: Yes Pulse Check Posterior Tibialis and Dorsalis pulse intact bilaterally: Yes Comments     Results for orders placed or performed in visit on 02/16/21  POCT HgB A1C  Result Value Ref Range   Hemoglobin A1C 6.8 (A) 4.0 - 5.6 %   HbA1c POC (<> result, manual entry)     HbA1c, POC (prediabetic range)     HbA1c, POC (controlled diabetic range)        Assessment & Plan:   1. Essential hypertension, benign  - CBC with Differential/Platelet - COMPLETE METABOLIC PANEL WITH GFR - amLODipine (NORVASC) 10 MG tablet; Take 1 tablet (10 mg total) by mouth daily.  Dispense: 90 tablet; Refill: 3 - losartan (COZAAR) 100 MG  tablet; Take 1 tablet (100 mg total) by mouth daily.  Dispense: 90 tablet; Refill: 1  2. Type 2 diabetes mellitus with diabetic nephropathy, 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HCC)  - Hemoglobin A1c - empagliflozin (JARDIANCE) 25 MG TABS tablet; Take 1 tablet (25 mg total) by mouth daily.  Dispense: 90 tablet; Refill: 3  3. Mixed hyperlipidemia  - Lipid panel - atorvastatin (LIPITOR) 40 MG tablet; TAKE 1 TABLET BY MOUTH EVERYDAY AT BEDTIME  Dispense: 90 tablet; Refill: 3  4. Aortic atherosclerosis (HCC) - Lipid panel - atorvastatin (LIPITOR) 40 MG tablet; TAKE 1 TABLET BY MOUTH EVERYDAY AT BEDTIME  Dispense: 90 tablet; Refill: 3  5. 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 Dian Situ  6. Rhinitis, unspecified type  - fluticasone (FLONASE) 50 MCG/ACT nasal spray; Place 2 sprays into both nostrils daily.  Dispense: 16 g; Refill: 6   Follow up plan: Return in about 4 months (around 12/21/2021) for follow up.

## 2021-08-24 LAB — COMPLETE METABOLIC PANEL WITH GFR
AG Ratio: 1.3 (calc) (ref 1.0–2.5)
ALT: 14 U/L (ref 9–46)
AST: 16 U/L (ref 10–35)
Albumin: 4 g/dL (ref 3.6–5.1)
Alkaline phosphatase (APISO): 74 U/L (ref 35–144)
BUN: 16 mg/dL (ref 7–25)
CO2: 32 mmol/L (ref 20–32)
Calcium: 10.2 mg/dL (ref 8.6–10.3)
Chloride: 104 mmol/L (ref 98–110)
Creat: 1.1 mg/dL (ref 0.70–1.28)
Globulin: 3.2 g/dL (calc) (ref 1.9–3.7)
Glucose, Bld: 123 mg/dL — ABNORMAL HIGH (ref 65–99)
Potassium: 4.5 mmol/L (ref 3.5–5.3)
Sodium: 139 mmol/L (ref 135–146)
Total Bilirubin: 0.5 mg/dL (ref 0.2–1.2)
Total Protein: 7.2 g/dL (ref 6.1–8.1)
eGFR: 70 mL/min/{1.73_m2} (ref >=60)

## 2021-08-24 LAB — LIPID PANEL
Cholesterol: 127 mg/dL (ref <200)
HDL: 38 mg/dL — ABNORMAL LOW (ref >=40)
LDL Cholesterol (Calc): 64 mg/dL (calc)
Non-HDL Cholesterol (Calc): 89 mg/dL (calc) (ref <130)
Total CHOL/HDL Ratio: 3.3 (calc) (ref <5.0)
Triglycerides: 186 mg/dL — ABNORMAL HIGH (ref <150)

## 2021-08-24 LAB — HEMOGLOBIN A1C
Hgb A1c MFr Bld: 6.3 % of total Hgb — ABNORMAL HIGH (ref <5.7)
Mean Plasma Glucose: 134 mg/dL
eAG (mmol/L): 7.4 mmol/L

## 2021-08-24 LAB — CBC WITH DIFFERENTIAL/PLATELET
Absolute Monocytes: 476 cells/uL (ref 200–950)
Basophils Absolute: 57 cells/uL (ref 0–200)
Basophils Relative: 0.8 %
Eosinophils Absolute: 170 cells/uL (ref 15–500)
Eosinophils Relative: 2.4 %
HCT: 47.9 % (ref 38.5–50.0)
Hemoglobin: 15.5 g/dL (ref 13.2–17.1)
Lymphs Abs: 2265 cells/uL (ref 850–3900)
MCH: 28.7 pg (ref 27.0–33.0)
MCHC: 32.4 g/dL (ref 32.0–36.0)
MCV: 88.7 fL (ref 80.0–100.0)
MPV: 9.8 fL (ref 7.5–12.5)
Monocytes Relative: 6.7 %
Neutro Abs: 4132 cells/uL (ref 1500–7800)
Neutrophils Relative %: 58.2 %
Platelets: 257 10*3/uL (ref 140–400)
RBC: 5.4 10*6/uL (ref 4.20–5.80)
RDW: 12.9 % (ref 11.0–15.0)
Total Lymphocyte: 31.9 %
WBC: 7.1 10*3/uL (ref 3.8–10.8)

## 2021-09-07 ENCOUNTER — Ambulatory Visit: Payer: Medicare PPO | Admitting: Dermatology

## 2021-09-07 ENCOUNTER — Encounter: Payer: Self-pay | Admitting: Dermatology

## 2021-09-07 ENCOUNTER — Other Ambulatory Visit: Payer: Self-pay

## 2021-09-07 ENCOUNTER — Ambulatory Visit: Payer: Medicare PPO | Admitting: Podiatry

## 2021-09-07 ENCOUNTER — Encounter: Payer: Self-pay | Admitting: Podiatry

## 2021-09-07 DIAGNOSIS — E1121 Type 2 diabetes mellitus with diabetic nephropathy: Secondary | ICD-10-CM | POA: Diagnosis not present

## 2021-09-07 DIAGNOSIS — D0472 Carcinoma in situ of skin of left lower limb, including hip: Secondary | ICD-10-CM

## 2021-09-07 DIAGNOSIS — M79676 Pain in unspecified toe(s): Secondary | ICD-10-CM

## 2021-09-07 DIAGNOSIS — B351 Tinea unguium: Secondary | ICD-10-CM | POA: Diagnosis not present

## 2021-09-07 DIAGNOSIS — L578 Other skin changes due to chronic exposure to nonionizing radiation: Secondary | ICD-10-CM

## 2021-09-07 DIAGNOSIS — Z794 Long term (current) use of insulin: Secondary | ICD-10-CM

## 2021-09-07 DIAGNOSIS — D099 Carcinoma in situ, unspecified: Secondary | ICD-10-CM

## 2021-09-07 NOTE — Patient Instructions (Signed)

## 2021-09-07 NOTE — Progress Notes (Signed)
° °  Follow-Up Visit   Subjective  Edgar [name] is a 76 y.o. male who presents for the following: SCC IS bx proven (L ant lat thigh, pt presents for treatment today). The patient has spots, moles and lesions to be evaluated, some may be new or changing and the patient has concerns that these could be cancer.  The following portions of the chart were reviewed this encounter and updated as appropriate:   Tobacco   Allergies   Meds   Problems   Med Hx   Surg Hx   Fam Hx      Review of Systems:  No other skin or systemic complaints except as noted in HPI or Assessment and Plan.  Objective  Well appearing patient in no apparent distress; mood and affect are within normal limits.  A focused examination was performed including L leg. Relevant physical exam findings are noted in the Assessment and Plan.  L ant lat thigh Pink bx site 4.0cm   Assessment & Plan   Actinic Damage - chronic, secondary to cumulative UV radiation exposure/sun exposure over time - diffuse scaly erythematous macules with underlying dyspigmentation - Recommend daily broad spectrum sunscreen SPF 30+ to sun-exposed areas, reapply every 2 hours as needed.  - Recommend staying in the shade or wearing long sleeves, sun glasses (UVA+UVB protection) and wide brim hats (4-inch brim around the entire circumference of the hat). - Call for new or changing lesions.   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  Destruction of lesion Complexity: extensive   Destruction method: electrodesiccation and curettage   Informed consent: discussed and consent obtained   Timeout:  patient name, date of birth, surgical site, and procedure verified Procedure prep:  Patient was prepped and draped in usual sterile fashion Prep type:  Isopropyl alcohol Anesthesia: the lesion was anesthetized in a standard fashion   Anesthetic:  1% lidocaine w/ epinephrine 1-100,000 buffered w/ 8.4% NaHCO3 Curettage performed in three different  directions: Yes   Electrodesiccation performed over the curetted area: Yes   Lesion length (cm):  4 Lesion width (cm):  2.5 Margin per side (cm):  0.2 Final wound size (cm):  4.4 Hemostasis achieved with:  pressure, aluminum chloride and electrodesiccation Outcome: patient tolerated procedure well with no complications   Post-procedure details: sterile dressing applied and wound care instructions given   Dressing type: bandage and bacitracin     Return in about 6 months (around 03/07/2022) for TBSE, Hx of SCC IS, recheck SCC IS L ant lat thigh.  I, Othelia Pulling, RMA, am acting as scribe for Sarina Ser, MD . Documentation: I have reviewed the above documentation for accuracy and completeness, and I agree with the above.  Sarina Ser, MD

## 2021-09-07 NOTE — Progress Notes (Signed)
This patient returns to my office for at risk foot care.  This patient requires this care by a professional since this patient will be at risk due to having  Diabetes with diabetic nephropathy.  This patient is unable to cut nails himself since the patient cannot reach his nails.These nails are painful walking and wearing shoes.  This patient presents for at risk foot care today.  General Appearance  Alert, conversant and in no acute stress.  Vascular  Dorsalis pedis and posterior tibial  pulses are palpable  bilaterally.  Capillary return is within normal limits  bilaterally. Temperature is within normal limits  bilaterally.  Neurologic  Senn-Weinstein monofilament wire test diminished  bilaterally. Muscle power within normal limits bilaterally.  Nails Thick disfigured discolored nails with subungual debris  from hallux to fifth toes bilaterally. No evidence of bacterial infection or drainage bilaterally.  Orthopedic  No limitations of motion  feet .  No crepitus or effusions noted.  No bony pathology or digital deformities noted.  HAV  B/L  Skin  normotropic skin with no porokeratosis noted bilaterally.  No signs of infections or ulcers noted.     Onychomycosis  Pain in right toes  Pain in left toes  Consent was obtained for treatment procedures.   Mechanical debridement of nails 1-5  bilaterally performed with a nail nipper.  Filed with dremel without incident.Dispensed diabetic shoes.Patient presents today and was dispensed 0ne pair ( two units) of medically necessary extra depth shoes with three pair( six units) of custom molded multiple density inserts. The shoes and the inserts are fitted to the patients ' feet and are noted to fit well and are free of defect.  Length and width of the shoes are also acceptable.  Patient was given written and verbal  instructions for wearing.  If any concerns arrive with the shoes or inserts, the patient is to call the office.Patient is to follow up with doctor  in six weeks.    Return office visit   3 months                   Told patient to return for periodic foot care and evaluation due to potential at risk complications.   [name] DPM

## 2021-09-10 ENCOUNTER — Encounter: Payer: Self-pay | Admitting: Dermatology

## 2021-09-12 ENCOUNTER — Other Ambulatory Visit: Payer: Self-pay | Admitting: Family Medicine

## 2021-09-12 ENCOUNTER — Telehealth: Payer: Self-pay | Admitting: Family Medicine

## 2021-09-12 DIAGNOSIS — E1121 Type 2 diabetes mellitus with diabetic nephropathy: Secondary | ICD-10-CM

## 2021-09-12 DIAGNOSIS — Z794 Long term (current) use of insulin: Secondary | ICD-10-CM

## 2021-09-12 MED ORDER — ONETOUCH ULTRA VI STRP: ORAL_STRIP | 11 refills | Status: DC

## 2021-09-12 NOTE — Telephone Encounter (Signed)
Requested Prescriptions  Pending Prescriptions Disp Refills   glucose blood (ONETOUCH ULTRA) test strip 300 strip 11    Sig: CHECK FINGERSTICK BLOOD SUGARS THREE TIMES A DAY, LON 99 MONTHS, FOR ICD-10 E11.65, E11.21, Z79.4     Endocrinology: Diabetes - Testing Supplies Passed - 09/12/2021 11:40 AM      Passed - Valid encounter within last 12 months    Recent Outpatient Visits          2 weeks ago Essential hypertension, benign   [facility] Serafina Royals F, FNP   6 months ago Type 2 diabetes mellitus with diabetic nephropathy, with long-term current use of insulin [facility])   [facility] [place], Malachy Mood, NP   7 months ago Nodule of skin of lower extremity   [facility] [place], Malachy Mood, NP   10 months ago Nonintractable headache, unspecified chronicity pattern, unspecified headache type   [place], PA-C   10 months ago Type 2 diabetes mellitus with diabetic nephropathy, with long-term current use of insulin [facility])   [facility] Trinna Post, [place]      Future Appointments            In 3 months Reece Packer, Myna Hidalgo, [facility], [place]   In 3 months  [facility], [place]   In 5 months Ralene Bathe, [place]           Signed Prescriptions Disp Refills   ONETOUCH ULTRA test strip 300 strip 11    Sig: CHECK FINGERSTICK BLOOD SUGARS THREE TIMES A DAY, LON 11 MONTHS, FOR ICD-10 E11.65, E11.21, Z79.4     Endocrinology: Diabetes - Testing Supplies Passed - 09/12/2021 10:33 AM      Passed - Valid encounter within last 12 months    Recent Outpatient Visits          2 weeks ago Essential hypertension, benign   [facility] Serafina Royals F, FNP   6 months ago Type 2 diabetes mellitus with diabetic nephropathy, with long-term current use of insulin [facility])   [facility] [place], Malachy Mood,  NP   7 months ago Nodule of skin of lower extremity   [facility] [place], Malachy Mood, NP   10 months ago Nonintractable headache, unspecified chronicity pattern, unspecified headache type   [place], [place]   10 months ago Type 2 diabetes mellitus with diabetic nephropathy, with long-term current use of insulin [facility])   [facility] Trinna Post, [place]      Future Appointments            In 3 months Reece Packer, Myna Hidalgo, [facility], [place]   In 3 months  [facility], [place]   In 5 months Ralene Bathe, MD [place]            Insulin Pen Needle (B-D UF III MINI PEN NEEDLES) 31G X 5 MM MISC 300 each 3    Sig: USE ONCE A DAY WITH LONG-ACTING INSULIN, AND UP TO 3X A DAY WITH SHORT-ACTING INSULIN     Endocrinology: Diabetes - Testing Supplies Passed - 09/12/2021 10:33 AM      Passed - Valid encounter within last 12 months    Recent Outpatient Visits          2 weeks ago Essential hypertension, benign  [facility] Serafina Royals F, FNP   6 months ago Type 2 diabetes mellitus with diabetic nephropathy, with long-term current use of insulin [facility])   [facility] Kathrine Haddock, NP   7 months ago Nodule of skin of lower extremity   [facility] [place], Malachy Mood, NP   10 months ago Nonintractable headache, unspecified chronicity pattern, unspecified headache type   [place], [place]   10 months ago Type 2 diabetes mellitus with diabetic nephropathy, with long-term current use of insulin [facility])   [facility] Trinna Post, [place]      Future Appointments            In 3 months Reece Packer, Myna Hidalgo, [facility], [place]   In 3 months  [facility], [place]   In 5 months Ralene Bathe, MD [place]

## 2021-09-12 NOTE — Telephone Encounter (Signed)
Pt called back to report that the pharmacy needs a new Rx for a new testing device along with supplies/strips. Please advise

## 2021-09-12 NOTE — Telephone Encounter (Signed)
Transmission failed- will resend

## 2021-09-13 ENCOUNTER — Other Ambulatory Visit: Payer: Self-pay

## 2021-09-13 DIAGNOSIS — E1121 Type 2 diabetes mellitus with diabetic nephropathy: Secondary | ICD-10-CM

## 2021-09-13 MED ORDER — ONETOUCH ULTRA MINI W/DEVICE KIT: 1.0000 | PACK | Freq: Three times a day (TID) | 0 refills | Status: DC

## 2021-09-13 MED ORDER — ONETOUCH ULTRASOFT LANCETS MISC: 12 refills | Status: DC

## 2021-09-13 NOTE — Telephone Encounter (Signed)
Pt was informed [facility].

## 2021-09-18 ENCOUNTER — Other Ambulatory Visit: Payer: Self-pay

## 2021-09-18 ENCOUNTER — Ambulatory Visit (INDEPENDENT_AMBULATORY_CARE_PROVIDER_SITE_OTHER): Payer: Medicare PPO | Admitting: Nurse Practitioner

## 2021-09-18 ENCOUNTER — Encounter (INDEPENDENT_AMBULATORY_CARE_PROVIDER_SITE_OTHER): Payer: Self-pay | Admitting: Nurse Practitioner

## 2021-09-18 VITALS — BP 130/73 | HR 61 | Resp 16 | Wt 277.0 lb

## 2021-09-18 DIAGNOSIS — I10 Essential (primary) hypertension: Secondary | ICD-10-CM

## 2021-09-18 DIAGNOSIS — I7121 Aneurysm of the ascending aorta, without rupture: Secondary | ICD-10-CM | POA: Diagnosis not present

## 2021-09-18 DIAGNOSIS — E782 Mixed hyperlipidemia: Secondary | ICD-10-CM | POA: Diagnosis not present

## 2021-09-19 ENCOUNTER — Telehealth: Payer: Self-pay

## 2021-09-19 NOTE — Progress Notes (Signed)
° ° °  Chronic Care Management Pharmacy Assistant   Name: Edgar [name]  MRN: [id number] DOB: 1945/09/19  Patient called to be reminded of his telephone appointment with [name], CPP on 09/20/2021 @ 1600   No answer, left message of appointment date, time and type of appointment (either telephone or in person). Left message to have all medications, supplements, blood pressure and/or blood sugar logs available during appointment and to return call if need to reschedule.  Star Rating Drug: Atorvastatin 40 mg last filled on 09/05/2021 for a 90-Day supply with [facility] Losartan 100 mg last filled on 06/27/2021 for a 90-Day supply with [facility] Jardiance 25 mg last filled on 08/30/2021 for a 90-Day supply with [facility]  Any gaps in medications fill history? No  Care Gaps: Zoster Vaccine Tetanus/TDAP COVID-19 [facility] [place], CPA/CMA Clinical Pharmacist Assistant Phone: [phone]

## 2021-09-20 ENCOUNTER — Telehealth: Payer: Medicare PPO

## 2021-09-20 ENCOUNTER — Ambulatory Visit (INDEPENDENT_AMBULATORY_CARE_PROVIDER_SITE_OTHER): Payer: Medicare PPO

## 2021-09-20 DIAGNOSIS — I10 Essential (primary) hypertension: Secondary | ICD-10-CM

## 2021-09-20 DIAGNOSIS — E1121 Type 2 diabetes mellitus with diabetic nephropathy: Secondary | ICD-10-CM

## 2021-09-20 NOTE — Progress Notes (Signed)
Chronic Care Management Pharmacy Note  09/21/2021 Name:  Edgar [name] MRN:  [id number] DOB:  Oct 04, 1945  Summary: Patient presents for CCM Follow-up.   Recommendations/Changes made from today's visit: Continue current medications  Plan: CPP follow-up 6 months  Subjective: Edgar [name] is an 76 y.o. year old male who is a primary patient of Delsa Grana, [place].  The CCM team was consulted for assistance with disease management and care coordination needs.    Engaged with patient by telephone for follow up visit in response to provider referral for pharmacy case management and/or care coordination services.   Consent to Services:  The patient was given information about Chronic Care Management services, agreed to services, and gave verbal consent prior to initiation of services.  Please see initial visit note for detailed documentation.   Patient Care Team: Delsa Grana, PA-C as PCP - General (Family Medicine) Germaine Pomfret, [facility] as Pharmacist (Pharmacist) [name], DPM as Consulting Physician (Podiatry) Ortho, [place]  Recent office visits: 08/23/21: Patient presented to Serafina Royals, FNP for follow-up. A1c 6.3%.   Recent consult visits: 09/18/20: Patient presented to Eulogio Ditch, NP. BP 130/73.   Hospital visits: 11/12/20: Patient presented to ED for headache.   Objective:  Lab Results  Component Value Date   CREATININE 1.10 08/23/2021   BUN 16 08/23/2021   GFRNONAA >60 11/12/2020   GFRAA 80 08/01/2020   NA 139 08/23/2021   K 4.5 08/23/2021   CALCIUM 10.2 08/23/2021   CO2 32 08/23/2021    Lab Results  Component Value Date/Time   HGBA1C 6.3 (H) 08/23/2021 01:47 PM   HGBA1C 6.8 (A) 02/16/2021 09:38 AM   HGBA1C 7.4 (A) 10/28/2020 09:21 AM   HGBA1C 7.4 (H) 08/01/2020 09:46 AM   MICROALBUR 20.5 10/15/2019 10:58 AM   MICROALBUR 15.2 09/01/2018 09:49 AM    Last diabetic Eye exam:  Lab Results  Component Value Date/Time   HMDIABEYEEXA No Retinopathy  10/26/2020 12:00 AM    Last diabetic Foot exam: No results found for: HMDIABFOOTEX   Lab Results  Component Value Date   CHOL 127 08/23/2021   HDL 38 (L) 08/23/2021   LDLCALC 64 08/23/2021   TRIG 186 (H) 08/23/2021   CHOLHDL 3.3 08/23/2021    Hepatic Function Latest Ref Rng & Units 08/23/2021 10/28/2020 08/01/2020  Total Protein 6.1 - 8.1 g/dL 7.2 7.4 7.4  Albumin 3.6 - 5.1 g/dL - - -  AST 10 - 35 U/L 16 13 14   ALT 9 - 46 U/L 14 9 15   Alk Phosphatase 40 - 115 U/L - - -  Total Bilirubin 0.2 - 1.2 mg/dL 0.5 0.6 0.6    Lab Results  Component Value Date/Time   TSH 2.71 08/26/2017 12:21 PM   TSH 3.00 12/07/2016 09:57 AM    CBC Latest Ref Rng & Units 08/23/2021 11/12/2020 10/28/2020  WBC 3.8 - 10.8 Thousand/uL 7.1 5.9 6.2  Hemoglobin 13.2 - 17.1 g/dL 15.5 15.4 15.4  Hematocrit 38.5 - 50.0 % 47.9 45.8 47.2  Platelets 140 - 400 Thousand/uL 257 255 260    Lab Results  Component Value Date/Time   VD25OH 52 08/26/2017 12:21 PM   VD25OH 45.9 01/06/2016 09:18 AM    Clinical ASCVD: No  The ASCVD Risk score (Arnett DK, et al., 2019) failed to calculate for the following reasons:   The valid total cholesterol range is 130 to 320 mg/dL    Depression screen [facility] 2/9 08/23/2021 02/16/2021 01/17/2021  Decreased Interest 0 0 0  Down, Depressed, Hopeless 0 0 0  PHQ - 2 Score 0 0 0  Altered sleeping 0 - -  Tired, decreased energy 0 - -  Change in appetite 0 - -  Feeling bad or failure about yourself  0 - -  Trouble concentrating 0 - -  Moving slowly or fidgety/restless 0 - -  Suicidal thoughts 0 - -  PHQ-9 Score 0 - -  Difficult doing work/chores Not difficult at all - -  Some recent data might be hidden    Social History   Tobacco Use  Smoking Status Former   Types: Cigarettes   Quit date: 09/01/1978   Years since quitting: 43.0  Smokeless Tobacco Never   BP Readings from Last 3 Encounters:  09/18/21 130/73  08/23/21 128/76  02/16/21 124/72   Pulse Readings from Last 3  Encounters:  09/18/21 61  08/23/21 81  02/16/21 82   Wt Readings from Last 3 Encounters:  09/18/21 277 lb (125.6 kg)  08/23/21 279 lb 3.2 oz (126.6 kg)  02/16/21 277 lb 8 oz (125.9 kg)    Assessment/Interventions: Review of patient past medical history, allergies, medications, health status, including review of consultants reports, laboratory and other test data, was performed as part of comprehensive evaluation and provision of chronic care management services.   SDOH:  (Social Determinants of Health) assessments and interventions performed: Yes SDOH Interventions    Flowsheet Row Most Recent Value  SDOH Interventions   Financial Strain Interventions Intervention Not Indicated         CCM Care Plan  No Known Allergies  Medications Reviewed Today     Reviewed by Sandy Salaam, CMA (Certified Medical Assistant) on 09/18/21 at [place] List Status: <None>   Medication Order Taking? Sig Documenting Provider Last Dose Status Informant  Accu-Chek Softclix Lancets lancets [id number] Yes USE AS INSTRUCTED DX ELL.21, CHECK BG TWICE A DAY Delsa Grana, PA-C Taking Active   acetaminophen (TYLENOL) 500 MG tablet [id number] Yes Take 500 mg by mouth every 6 (six) hours as needed. [provider] Taking Active Multiple Informants           Med Note Tomasita Crumble, AMBER N   Fri Dec 07, 2016  9:14 AM) PRN  amLODipine (NORVASC) 10 MG tablet [id number] Yes Take 1 tablet (10 mg total) by mouth daily. Bo Merino, FNP Taking Active   aspirin EC 81 MG tablet [id number] Yes Take 81 mg by mouth daily. Swallow whole. [provider] Taking Active   atorvastatin (LIPITOR) 40 MG tablet [id number] Yes TAKE 1 TABLET BY MOUTH EVERYDAY AT BEDTIME Bo Merino, FNP Taking Active   Blood Glucose Monitoring Suppl (ONE TOUCH ULTRA 2) w/Device KIT [id number] Yes Check fingerstick blood sugars three times a day; DX E11.65, LON 99 months Lada, Satira Anis, MD Taking Active   Blood Glucose  Monitoring Suppl (ONE TOUCH ULTRA MINI) w/Device KIT [id number] Yes 1 Device by Does not apply route in the morning, at noon, and at bedtime. E11.21, LON:99 check BG tid Delsa Grana, PA-C Taking Active   butalbital-acetaminophen-caffeine (FIORICET) 50-325-40 MG tablet [id number] Yes Take 1-2 tablets by mouth every 6 (six) hours as needed for headache. Vladimir Crofts, MD Taking Active   cholecalciferol (VITAMIN D) 1000 units tablet [id number] Yes Take 1,000 Units by mouth 2 (two) times daily.  [provider] Taking Active Multiple Informants  empagliflozin (JARDIANCE) 25 MG TABS tablet [id number] Yes Take 1 tablet (25 mg total) by mouth daily. Bo Merino, FNP  Taking Active   fluticasone (FLONASE) 50 MCG/ACT nasal spray [id number] Yes Place 2 sprays into both nostrils daily. Bo Merino, FNP Taking Active   HUMALOG KWIKPEN 100 UNIT/ML KwikPen [id number] Yes INJECT 10 UNITS TOTAL INTO THE SKIN 3 (THREE) TIMES DAILY. Delsa Grana, PA-C Taking Active   insulin aspart (NOVOLOG FLEXPEN) 100 UNIT/ML FlexPen [id number] Yes Novolog FlexPen U-100 Insulin aspart 100 unit/mL (3 mL) subcutaneous [provider] Taking Active   Insulin Glargine (BASAGLAR KWIKPEN) 100 UNIT/ML [id number] Yes Inject 60 Units into the skin at bedtime. [address] [place], [place], PA-C Taking Active   Insulin Pen Needle (B-D UF III MINI PEN NEEDLES) 31G X 5 MM MISC [id number] Yes USE ONCE A DAY WITH LONG-ACTING INSULIN, AND UP TO 3X A DAY WITH SHORT-ACTING INSULIN Delsa Grana, PA-C Taking Active   Lancets [facility] ULTRASOFT) lancets [id number] Yes E11.21, Lon:99 check BG tid Delsa Grana, PA-C Taking Active   losartan (COZAAR) 100 MG tablet [id number] Yes Take 1 tablet (100 mg total) by mouth daily. 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[place]) 05/14/2017   History of colonoscopy with polypectomy 12/07/2016   Fatty liver 09/11/2016   Aneurysm of aorta ([place]) 09/11/2016   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   Tinea pedis 06/25/2016   Hx of pulmonary embolus 05/18/2016   Essential hypertension, benign 12/23/2015   Hyperlipidemia 12/23/2015   Morbid obesity ([place]) 12/23/2015   Type 2 diabetes mellitus with diabetic nephropathy, 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[place]) 01/25/2015    Immunization History  Administered Date(s) Administered   Fluad Quad(high Dose 65+) 04/14/2019, 04/29/2020   Influenza, High Dose Seasonal PF 05/30/2015, 04/30/2017, 05/29/2018, 04/24/2021   Influenza-Unspecified 04/25/2016   Moderna Sars-Covid-2 Vaccination 09/18/2019, 10/16/2019, 07/05/2020   Pneumococcal Conjugate-13 10/08/2013   Pneumococcal Polysaccharide-23 08/26/2017   Pneumococcal-Unspecified 04/17/2010   Td 05/13/2009   Zoster, Live 10/11/2013    Conditions to be addressed/monitored:  Hypertension, Hyperlipidemia, Diabetes and Coronary Artery Disease  Care Plan : General Pharmacy (Adult)  Updates made by Germaine Pomfret, RPH since 09/21/2021 12:00 AM     Problem: Hypertension, Hyperlipidemia, Diabetes and Coronary Artery Disease   Priority: High     Long-Range Goal: Patient-Specific Goal   Start Date: 10/26/2020  Expected End Date: 03/22/2022  This Visit's Progress: On track  Recent Progress: On track  Priority: High  Note:   Current Barriers:  Unable to independently afford treatment regimen  Pharmacist Clinical Goal(s):  Over the next 90 days, patient will verbalize ability to afford treatment regimen maintain  control of diabetes as evidenced by A1c less than 8%  through collaboration with PharmD and provider.   Interventions: 1:1 collaboration with Delsa Grana, PA-C regarding development and update of comprehensive plan of care as evidenced by provider attestation and co-signature Inter-disciplinary care team collaboration (see longitudinal plan of care) Comprehensive medication review performed; medication list updated in electronic medical record  Hypertension (BP goal <140/90) -Controlled -Current treatment: Amlodipine 10 mg daily: Appropriate, Effective,  Safe, Accessible Losartan 100 mg daily: Appropriate, Effective, Safe, Accessible  -Medications previously tried: NA  -Current home readings: NA -Denies hypotensive/hypertensive symptoms -Educated on Importance of home blood pressure monitoring; -Counseled to monitor BP at home weekly, document, and provide log at future appointments -Recommended to continue current medication  Hyperlipidemia: (LDL goal < 70) -Controlled -Current treatment: Atorvastatin 40 mg daily  -Medications previously tried: NA  -Educated on Importance of limiting foods high in cholesterol; -Recommended to continue current medication  Diabetes (A1c goal <8%) -Controlled -Current medications: Jardiance 25 mg daily: Appropriate, Effective, Safe, Accessible  Humalog 10 units three times daily: Appropriate, Effective, Safe, Accessible  Basaglar 60 units nightly (0.47 u/kg): Appropriate, Effective, Safe, Accessible -Medications previously tried: NA  -Current home glucose readings: 140-175  -Denies hypoglycemic/hyperglycemic symptoms.  -Recommend continuing current medications.   Patient Goals/Self-Care Activities Over the next 90 days, patient will:  - check glucose four times daily: before meals and at bedtime , document, and provide at future appointments check blood pressure weekly, document, and provide at future appointments  Follow Up Plan: Telephone  follow up appointment with care management team member scheduled for:  01/31/2022 at 11:00 AM      Medication Assistance:  Jardiance, Humalog, Basaglar obtained through Multiple medication assistance program.  Enrollment ends Dec 2022  Patient's preferred pharmacy is:  [id number] [place]Phone: [phone]: [phone] Uses pill box? Yes Pt endorses 100% compliance  We discussed: Current pharmacy is preferred with insurance plan and patient is satisfied with pharmacy services Patient decided to: Continue current medication management strategy  Care Plan and Follow Up Patient Decision:  Patient agrees to Care Plan and Follow-up.  Plan: Telephone follow up appointment with care management team member scheduled for:  01/31/2022 at 11:00 AM  AMalva Limes CRolling HillsPharmacist Practitioner  CMer[phone]

## 2021-09-21 NOTE — Patient Instructions (Signed)
Visit Information It was great speaking with you today!  Please let me know if you have any questions about our visit.   Goals Addressed             This Visit's Progress    Monitor and Manage My Blood Sugar-Diabetes Type 2   On track    Timeframe:  Long-Range Goal Priority:  High Start Date:  10/27/2020                           Expected End Date:  04/29/2022                     Follow Up Date 08/04/2021    - check blood sugar twice daily: before meals and at bedtime  - check blood sugar if I feel it is too high or too low - enter blood sugar readings and medication or insulin into daily log - take the blood sugar log to all doctor visits    Why is this important?   Checking your blood sugar at home helps to keep it from getting very high or very low.  Writing the results in a diary or log helps the doctor know how to care for you.  Your blood sugar log should have the time, date and the results.  Also, write down the amount of insulin or other medicine that you take.  Other information, like what you ate, exercise done and how you were feeling, will also be helpful.     Notes:         Patient Care Plan: General Pharmacy (Adult)     Problem Identified: Hypertension, Hyperlipidemia, Diabetes and Coronary Artery Disease   Priority: High     Long-Range Goal: Patient-Specific Goal   Start Date: 10/26/2020  Expected End Date: 03/22/2022  This Visit's Progress: On track  Recent Progress: On track  Priority: High  Note:   Current Barriers:  Unable to independently afford treatment regimen  Pharmacist Clinical Goal(s):  Over the next 90 days, patient will verbalize ability to afford treatment regimen maintain control of diabetes as evidenced by A1c less than 8%  through collaboration with PharmD and provider.   Interventions: 1:1 collaboration with Delsa Grana, PA-C regarding development and update of comprehensive plan of care as evidenced by provider attestation and  co-signature Inter-disciplinary care team collaboration (see longitudinal plan of care) Comprehensive medication review performed; medication list updated in electronic medical record  Hypertension (BP goal <140/90) -Controlled -Current treatment: Amlodipine 10 mg daily: Appropriate, Effective, Safe, Accessible Losartan 100 mg daily: Appropriate, Effective, Safe, Accessible  -Medications previously tried: NA  -Current home readings: NA -Denies hypotensive/hypertensive symptoms -Educated on Importance of home blood pressure monitoring; -Counseled to monitor BP at home weekly, document, and provide log at future appointments -Recommended to continue current medication  Hyperlipidemia: (LDL goal < 70) -Controlled -Current treatment: Atorvastatin 40 mg daily  -Medications previously tried: NA  -Educated on Importance of limiting foods high in cholesterol; -Recommended to continue current medication  Diabetes (A1c goal <8%) -Controlled -Current medications: Jardiance 25 mg daily: Appropriate, Effective, Safe, Accessible  Humalog 10 units three times daily: Appropriate, Effective, Safe, Accessible  Basaglar 60 units nightly (0.47 u/kg): Appropriate, Effective, Safe, Accessible -Medications previously tried: NA  -Current home glucose readings: 140-175  -Denies hypoglycemic/hyperglycemic symptoms.  -Recommend continuing current medications.   Patient Goals/Self-Care Activities Over the next 90 days, patient will:  - check glucose four times  daily: before meals and at bedtime , document, and provide at future appointments check blood pressure weekly, document, and provide at future appointments  Follow Up Plan: Telephone follow up appointment with care management team member scheduled for:  01/31/2022 at 11:00 AM    Patient agreed to services and verbal consent obtained.   Patient verbalizes understanding of instructions and care plan provided today and agrees to view in [place].  Active MyChart status confirmed with patient.    Malva Limes, [place] Pharmacist Practitioner  [facility] [phone]

## 2021-09-25 NOTE — Progress Notes (Signed)
Subjective:    Patient ID: Edgar [name], male    DOB: 16-Jun-1946, 76 y.o.   MRN: [id number] Chief Complaint  Patient presents with   Follow-up    Re-est care last seen 11/2019    Edgar [name] is a 76 y.o. male.   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  He had a CT scan performed 01/02/2020 which I have independently reviewed which showed a stable 4.3 to 4.4 cm ascending thoracic aortic aneurysm.  Due to other concerns such as taking care of his wife who unfortunately recently passed away the patient has not been able to follow-up since 2021.  He has no current chest pain or signs of peripheral embolization.   His blood pressure is generally under fairly good control.       Review of Systems  Cardiovascular:  Negative for chest pain.  All other systems reviewed and are negative.     Objective:   Physical Exam Vitals reviewed.  HENT:     Head: Normocephalic.  Cardiovascular:     Rate and Rhythm: Normal rate.  Pulmonary:     Effort: Pulmonary effort is normal.  Skin:    General: Skin is warm and dry.  Neurological:     Mental Status: He is alert and oriented to person, place, and time.  Psychiatric:        Mood and Affect: Mood normal.        Behavior: Behavior normal.        Thought Content: Thought content normal.        Judgment: Judgment normal.    BP 130/73 (BP Location: Right Arm)    Pulse 61    Resp 16    Wt 277 lb (125.6 kg)    BMI 36.55 kg/m   Past Medical History:  Diagnosis Date   Aneurysm of aorta (Edgar [name]) 09/11/2016   Noted on CT scan Sept 2017   Aneurysm of thoracic aorta 09/24/2017   Closed intertrochanteric fracture of left hip, sequela 05/30/2016   Coronary artery disease 10/25/2017   CT scan March 2019   Diabetes mellitus without complication (Edgar [name])    Essential hypertension, benign 12/23/2015   History of colonoscopy with polypectomy 12/07/2016   Polypectomy x  12 per patient   Hx of tobacco use, presenting hazards to health 12/23/2015   Quit prior to 1997   Hx of tobacco use, presenting hazards to health 12/23/2015   Quit prior to 1997    Hyperlipidemia 12/23/2015   Hypertension    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Edgar [name]) 05/14/2017   Obesity 12/23/2015   Pulmonary embolism (Edgar [name]) 05/18/2016   Hospitalization Sept 2017, following hip fracture   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   left ant lat thigh, SCCis in a SK, schedule EDC   Squamous cell carcinoma of skin 08/14/2021   L ant lat thigh, EDC 09/07/21    Social History   Socioeconomic History   Marital status: Widowed    Spouse name: Not on file   Number of children: Not on file   Years of education: Not on file   Highest education level: Not on file  Occupational History   Not on file  Tobacco Use   Smoking status: Former    Types: Cigarettes    Quit date: 09/01/1978    Years since quitting: 93.0  Smokeless tobacco: Never  Vaping Use   Vaping Use: Never used  Substance and Sexual Activity   Alcohol use: No    Alcohol/week: 0.0 standard drinks   Drug use: No   Sexual activity: Not Currently  Other Topics Concern   Not on file  Social History Narrative   Pt's daughter lives with him   Social Determinants of Health   Financial Resource Strain: Low Risk    Difficulty of Paying Living Expenses: Not hard at all  Food Insecurity: No Food Insecurity   Worried About Charity fundraiser in the Last Year: Never true   Arboriculturist in the Last Year: Never true  Transportation Needs: No Transportation Needs   Lack of Transportation (Medical): No   Lack of Transportation (Non-Medical): No  Physical Activity: Sufficiently Active   Days of Exercise per Week: 5 days   Minutes of Exercise per Session: 30 min  Stress: No Stress Concern Present   Feeling of Stress : Not at all  Social Connections: Moderately Isolated   Frequency of Communication  with Friends and Family: More than three times a week   Frequency of Social Gatherings with Friends and Family: More than three times a week   Attends Religious Services: More than 4 times per year   Active Member of [employer] or Organizations: No   Attends Archivist Meetings: Never   Marital Status: Widowed  Intimate Partner Violence: Not At Risk   Fear of Current or Ex-Partner: No   Emotionally Abused: No   Physically Abused: No   Sexually Abused: No    Past Surgical History:  Procedure Laterality Date   COLONOSCOPY WITH PROPOFOL N/A 01/15/2017   Procedure: COLONOSCOPY WITH PROPOFOL;  Surgeon: Edgar Bellows, MD;  Location: [facility] ENDOSCOPY;  Service: Endoscopy;  Laterality: N/A;   COLONOSCOPY WITH PROPOFOL N/A 12/17/2019   Procedure: COLONOSCOPY WITH PROPOFOL;  Surgeon: Edgar Bellows, MD;  Location: [facility] ENDOSCOPY;  Service: Gastroenterology;  Laterality: N/A;   EYE SURGERY     FRACTURE SURGERY     INTRAMEDULLARY (IM) NAIL INTERTROCHANTERIC Left 04/14/2016   Procedure: INTRAMEDULLARY (IM) NAIL INTERTROCHANTRIC;  Surgeon: Edgar Mull, MD;  Location: [facility] ORS;  Service: Orthopedics;  Laterality: Left;   JOINT REPLACEMENT Left 04/2016   TOTAL HIP ARTHROPLASTY      Family History  Problem Relation Age of Onset   Cancer Mother    Diabetes Mother    Heart disease Father    Alcohol abuse Brother     No Known Allergies  CBC Latest Ref Rng & Units 08/23/2021 11/12/2020 10/28/2020  WBC 3.8 - 10.8 Thousand/uL 7.1 5.9 6.2  Hemoglobin 13.2 - 17.1 g/dL 15.5 15.4 15.4  Hematocrit 38.5 - 50.0 % 47.9 45.8 47.2  Platelets 140 - 400 Thousand/uL 257 255 260      CMP     Component Value Date/Time   NA 139 08/23/2021 1347   NA 139 01/06/2016 0918   K 4.5 08/23/2021 1347   CL 104 08/23/2021 1347   CO2 32 08/23/2021 1347   GLUCOSE 123 (H) 08/23/2021 1347   BUN 16 08/23/2021 1347   BUN 16 01/06/2016 0918   CREATININE 1.10 08/23/2021 1347   CALCIUM 10.2 08/23/2021 1347   PROT 7.2  08/23/2021 1347   PROT 7.5 12/23/2015 0959   ALBUMIN 4.0 12/07/2016 0957   ALBUMIN 3.8 01/06/2016 0918   AST 16 08/23/2021 1347   ALT 14 08/23/2021 1347   ALKPHOS 87 12/07/2016 0957  BILITOT 0.5 08/23/2021 1347   BILITOT 0.5 12/23/2015 0959   GFRNONAA >60 11/12/2020 1142   GFRNONAA 69 08/01/2020 0946   GFRAA 80 08/01/2020 0946     No results found.     Assessment & Plan:   1. Aneurysm of ascending aorta without rupture I have independently reviewed his most recent CT scan with that was well over a year ago. 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  No role for intervention at that level.  Would continue surveillance studies every year or so with CT scan we will obtain a CT scan in the next few weeks at his convenience.  Blood pressure control is the most important factor for reducing progression of the size of the aneurysm. - CT CHEST W CONTRAST; Future  2. Essential hypertension, benign Continue antihypertensive medications as already ordered, these medications have been reviewed and there are no changes at this time.   3. Mixed hyperlipidemia Continue statin as ordered and reviewed, no changes at this time    Current Outpatient Medications on File Prior to Visit  Medication Sig Dispense Refill   Accu-Chek Softclix Lancets lancets USE AS INSTRUCTED DX ELL.21, CHECK BG TWICE A DAY 100 each 12   acetaminophen (TYLENOL) 500 MG tablet Take 500 mg by mouth every 6 (six) hours as needed.     amLODipine (NORVASC) 10 MG tablet Take 1 tablet (10 mg total) by mouth daily. 90 tablet 3   aspirin EC 81 MG tablet Take 81 mg by mouth daily. Swallow whole.     atorvastatin (LIPITOR) 40 MG tablet TAKE 1 TABLET BY MOUTH EVERYDAY AT BEDTIME 90 tablet 3   Blood Glucose Monitoring Suppl (ONE TOUCH ULTRA 2) w/Device KIT Check fingerstick blood sugars three times a day; DX E11.65, LON 99 months 1 each 0   Blood Glucose Monitoring Suppl (ONE  TOUCH ULTRA MINI) w/Device KIT 1 Device by Does not apply route in the morning, at noon, and at bedtime. E11.21, LON:99 check BG tid 1 kit 0   butalbital-acetaminophen-caffeine (FIORICET) 50-325-40 MG tablet Take 1-2 tablets by mouth every 6 (six) hours as needed for headache. 30 tablet 0   cholecalciferol (VITAMIN D) 1000 units tablet Take 1,000 Units by mouth 2 (two) times daily.      empagliflozin (JARDIANCE) 25 MG TABS tablet Take 1 tablet (25 mg total) by mouth daily. 90 tablet 3   fluticasone (FLONASE) 50 MCG/ACT nasal spray Place 2 sprays into both nostrils daily. 16 g 6   HUMALOG KWIKPEN 100 UNIT/ML KwikPen INJECT 10 UNITS TOTAL INTO THE SKIN 3 (THREE) TIMES DAILY. 15 mL 6   insulin aspart (NOVOLOG FLEXPEN) 100 UNIT/ML FlexPen Novolog FlexPen U-100 Insulin aspart 100 unit/mL (3 mL) subcutaneous     Insulin Glargine (BASAGLAR KWIKPEN) 100 UNIT/ML Inject 60 Units into the skin at bedtime. Lilly Cares 15 mL 0   Insulin Pen Needle (B-D UF III MINI PEN NEEDLES) 31G X 5 MM MISC USE ONCE A DAY WITH LONG-ACTING INSULIN, AND UP TO 3X A DAY WITH SHORT-ACTING INSULIN 300 each 3   Lancets (ONETOUCH ULTRASOFT) lancets E11.21, Lon:99 check BG tid 100 each 12   losartan (COZAAR) 100 MG tablet Take 1 tablet (100 mg total) by mouth daily. 90 tablet 1   ONETOUCH ULTRA test strip CHECK FINGERSTICK BLOOD SUGARS THREE TIMES A DAY, LON 99 MONTHS, FOR ICD-10 E11.65, E11.21, Z79.4 300 strip 11   No current facility-administered medications on  file prior to visit.    There are no Patient Instructions on file for this visit. No follow-ups on file.   Kris Hartmann, NP

## 2021-09-26 ENCOUNTER — Encounter (INDEPENDENT_AMBULATORY_CARE_PROVIDER_SITE_OTHER): Payer: Self-pay | Admitting: Nurse Practitioner

## 2021-10-03 DIAGNOSIS — I10 Essential (primary) hypertension: Secondary | ICD-10-CM

## 2021-10-03 DIAGNOSIS — E1121 Type 2 diabetes mellitus with diabetic nephropathy: Secondary | ICD-10-CM

## 2021-10-03 DIAGNOSIS — Z794 Long term (current) use of insulin: Secondary | ICD-10-CM | POA: Diagnosis not present

## 2021-10-10 ENCOUNTER — Other Ambulatory Visit: Payer: Self-pay | Admitting: Family Medicine

## 2021-10-10 DIAGNOSIS — E1121 Type 2 diabetes mellitus with diabetic nephropathy: Secondary | ICD-10-CM

## 2021-10-10 DIAGNOSIS — Z794 Long term (current) use of insulin: Secondary | ICD-10-CM

## 2021-10-26 ENCOUNTER — Ambulatory Visit: Payer: Medicare PPO

## 2021-10-26 ENCOUNTER — Telehealth (INDEPENDENT_AMBULATORY_CARE_PROVIDER_SITE_OTHER): Payer: Self-pay | Admitting: Nurse Practitioner

## 2021-10-26 ENCOUNTER — Other Ambulatory Visit (INDEPENDENT_AMBULATORY_CARE_PROVIDER_SITE_OTHER): Payer: Self-pay | Admitting: Nurse Practitioner

## 2021-10-26 DIAGNOSIS — I7121 Aneurysm of the ascending aorta, without rupture: Secondary | ICD-10-CM

## 2021-10-26 NOTE — Telephone Encounter (Signed)
Melissa from [facility] needs and authorization for CT that pt. Is having tomorrow.  She can be reached at [phone], Ext. [phone] ?

## 2021-10-26 NOTE — Telephone Encounter (Signed)
This requires submission through [employer] and I do not have access

## 2021-10-27 ENCOUNTER — Ambulatory Visit: Admission: RE | Admit: 2021-10-27 | Payer: Medicare PPO | Source: Ambulatory Visit

## 2021-11-01 DIAGNOSIS — E119 Type 2 diabetes mellitus without complications: Secondary | ICD-10-CM | POA: Diagnosis not present

## 2021-11-01 LAB — HM DIABETES EYE EXAM

## 2021-11-09 ENCOUNTER — Ambulatory Visit
Admission: RE | Admit: 2021-11-09 | Discharge: 2021-11-09 | Disposition: A | Discharge status: Home / Self Care | Payer: Medicare PPO | Source: Ambulatory Visit | Attending: Nurse Practitioner | Admitting: Nurse Practitioner

## 2021-11-09 DIAGNOSIS — I77810 Thoracic aortic ectasia: Secondary | ICD-10-CM | POA: Diagnosis not present

## 2021-11-09 DIAGNOSIS — I7121 Aneurysm of the ascending aorta, without rupture: Secondary | ICD-10-CM | POA: Diagnosis not present

## 2021-11-09 LAB — POCT I-STAT CREATININE: Creatinine, Ser: 1.2 mg/dL (ref 0.61–1.24)

## 2021-11-09 MED ORDER — IOHEXOL 350 MG/ML SOLN
75.0000 mL | Freq: Once | INTRAVENOUS | Status: AC | PRN
  Administered 2021-11-09: 75 mL via INTRAVENOUS

## 2021-11-13 ENCOUNTER — Encounter (INDEPENDENT_AMBULATORY_CARE_PROVIDER_SITE_OTHER): Payer: Self-pay

## 2021-11-24 ENCOUNTER — Telehealth: Payer: Self-pay

## 2021-11-24 NOTE — Progress Notes (Signed)
? ? ?Chronic Care Management ?Pharmacy Assistant  ? ?Name: Edgar [name]  MRN: [id number] DOB: [birth year] ? ?Reason for Encounter: Hypertension Disease State Call ?  ?Recent office visits:  ?None ID ? ?Recent consult visits:  ?None ID ? ?Hospital visits:  ?None in previous 6 months ? ?Medications: ?Outpatient Encounter Medications as of 11/24/2021  ?Medication Sig Note  ? ACCU-CHEK GUIDE test strip USE IN THE MORNING AT [facility] AND AT BEDTIME E11.21   ? Accu-Chek Softclix Lancets lancets USE AS INSTRUCTED DX ELL.21, CHECK BG TWICE A DAY   ? acetaminophen (TYLENOL) 500 MG tablet Take 500 mg by mouth every 6 (six) hours as needed. 12/07/2016: PRN  ? amLODipine (NORVASC) 10 MG tablet Take 1 tablet (10 mg total) by mouth daily.   ? aspirin EC 81 MG tablet Take 81 mg by mouth daily. Swallow whole.   ? atorvastatin (LIPITOR) 40 MG tablet TAKE 1 TABLET BY MOUTH EVERYDAY AT BEDTIME   ? Blood Glucose Monitoring Suppl (ONE TOUCH ULTRA 2) w/Device KIT Check fingerstick blood sugars three times a day; DX E11.65, LON 99 months   ? Blood Glucose Monitoring Suppl (ONE TOUCH ULTRA MINI) w/Device KIT 1 Device by Does not apply route in the morning, at noon, and at bedtime. E11.21, LON:99 check BG tid   ? cholecalciferol (VITAMIN D) 1000 units tablet Take 1,000 Units by mouth 2 (two) times daily.    ? empagliflozin (JARDIANCE) 25 MG TABS tablet Take 1 tablet (25 mg total) by mouth daily.   ? fluticasone (FLONASE) 50 MCG/ACT nasal spray Place 2 sprays into both nostrils daily.   ? HUMALOG KWIKPEN 100 UNIT/ML KwikPen INJECT 10 UNITS TOTAL INTO THE SKIN 3 (THREE) TIMES DAILY.   ? insulin aspart (NOVOLOG FLEXPEN) 100 UNIT/ML FlexPen Novolog FlexPen U-100 Insulin aspart 100 unit/mL (3 mL) subcutaneous   ? Insulin Glargine (BASAGLAR KWIKPEN) 100 UNIT/ML Inject 60 Units into the skin at bedtime. Lilly Cares   ? Insulin Pen Needle (B-D UF III MINI PEN NEEDLES) 31G X 5 MM MISC USE ONCE A DAY WITH LONG-ACTING INSULIN, AND UP TO 3X A DAY WITH  SHORT-ACTING INSULIN   ? Lancets (ONETOUCH ULTRASOFT) lancets E11.21, Lon:99 check BG tid   ? losartan (COZAAR) 100 MG tablet Take 1 tablet (100 mg total) by mouth daily.   ? ?No facility-administered encounter medications on file as of 11/24/2021.  ? ?Care Gaps: ?Zoster Vaccine ?Tetanus/TDAP ?COVID-19 Vaccine Booster 4 ?Diabetic Eye Exam ? ?Star Rating Drugs: ?Atorvastatin 40 mg last filled on 09/05/2021 for a 90-Day supply with [facility] ?Losartan 100 mg last filled on 09/22/2021 for a 90-Day supply with [facility] ?Jardiance 25 mg last filled on 08/30/2021 for a 90-Day supply with [facility]- Patient should be receiving this medication through [place] Patient assistance program ? ?Reviewed chart prior to disease state call. Spoke with patient regarding BP ? ?Recent Office Vitals: ?BP Readings from Last 3 Encounters:  ?09/18/21 130/73  ?08/23/21 128/76  ?02/16/21 124/72  ? ?Pulse Readings from Last 3 Encounters:  ?09/18/21 61  ?08/23/21 81  ?02/16/21 82  ?  ?Wt Readings from Last 3 Encounters:  ?09/18/21 277 lb (125.6 kg)  ?08/23/21 279 lb 3.2 oz (126.6 kg)  ?02/16/21 277 lb 8 oz (125.9 kg)  ?  ?Kidney Function ?Lab Results  ?Component Value Date/Time  ? CREATININE 1.20 11/09/2021 01:58 PM  ? CREATININE 1.10 08/23/2021 01:47 PM  ? CREATININE 0.90 11/12/2020 11:42 AM  ? CREATININE 1.04 10/28/2020 09:39 AM  ?  GFRNONAA >60 11/12/2020 11:42 AM  ? GFRNONAA 69 08/01/2020 09:46 AM  ? GFRAA 80 08/01/2020 09:46 AM  ? ? ?  Latest Ref Rng & Units 11/09/2021  ?  1:58 PM 08/23/2021  ?  1:47 PM 11/12/2020  ? 11:42 AM  ?BMP  ?Glucose 65 - 99 mg/dL  123   76    ?BUN 7 - 25 mg/dL  16   13    ?Creatinine 0.61 - 1.24 mg/dL 1.20   1.10   0.90    ?BUN/Creat Ratio 6 - 22 (calc)  NOT APPLICABLE     ?Sodium 135 - 146 mmol/L  139   141    ?Potassium 3.5 - 5.3 mmol/L  4.5   3.9    ?Chloride 98 - 110 mmol/L  104   110    ?CO2 20 - 32 mmol/L  32   25    ?Calcium 8.6 - 10.3 mg/dL  10.2   9.6    ? ?Current antihypertensive regimen:   ?Amlodipine 10 mg daily ?Losartan 100 mg daily ?  ?How often are you checking your Blood Pressure? infrequently ?Current home BP readings: Patient hasn't checked his blood pressure  ? ?What recent interventions/DTPs have been made by any provider to improve Blood Pressure control since last CPP Visit: None ID ? ?Any recent hospitalizations or ED visits since last visit with CPP? No ? ?What diet changes have been made to improve Blood Pressure Control?  ?Patient hasn't made any changes to his diet ? ?What exercise is being done to improve your Blood Pressure Control?  ?Patient doesn't have a routine exercise, but he get's out daily and works  ? ?Adherence Review: ?Is the patient currently on ACE/ARB medication? Yes ?Does the patient have >5 day gap between last estimated fill dates? No ? ?I spoke with the patient's daughter who is on HIPAA, and she reports the patient is doing well. She denies any ill symptoms regarding the patient. She reports the patient is taking all medications as directed with no issues or complaints. Per Edgar [name] patient is not having any issues with his PAP medications they are all being delivered with no problems.  ? ?There are no other concerns or issues at this time.  ? ? ?Patient has a telephone appointment with Edgar [name], CPP on 01/31/2022 @1100  ? ?Edgar [name], CPA/CMA ?Clinical Pharmacist Assistant ?Phone: [phone]  ? ? ? ? ?

## 2021-12-04 ENCOUNTER — Telehealth (INDEPENDENT_AMBULATORY_CARE_PROVIDER_SITE_OTHER): Payer: Self-pay

## 2021-12-04 ENCOUNTER — Ambulatory Visit (INDEPENDENT_AMBULATORY_CARE_PROVIDER_SITE_OTHER): Payer: Medicare PPO | Admitting: Nurse Practitioner

## 2021-12-04 ENCOUNTER — Encounter (INDEPENDENT_AMBULATORY_CARE_PROVIDER_SITE_OTHER): Payer: Self-pay | Admitting: Nurse Practitioner

## 2021-12-04 VITALS — BP 116/72 | HR 72 | Resp 16 | Ht 72.0 in | Wt 280.0 lb

## 2021-12-04 DIAGNOSIS — E782 Mixed hyperlipidemia: Secondary | ICD-10-CM | POA: Diagnosis not present

## 2021-12-04 DIAGNOSIS — I7121 Aneurysm of the ascending aorta, without rupture: Secondary | ICD-10-CM | POA: Diagnosis not present

## 2021-12-04 DIAGNOSIS — I10 Essential (primary) hypertension: Secondary | ICD-10-CM

## 2021-12-04 NOTE — Telephone Encounter (Signed)
Patient was advised to follow up with PCP regarding those findings. As far as vascular findings, we will repeat for his thoracic aortic aneurysm in one year.  We will also send referral to pulmonology for concern of pulmonary hypertension.

## 2021-12-04 NOTE — Telephone Encounter (Signed)
Left a message on patient daughter voicemail to return call to the office ?

## 2021-12-04 NOTE — Telephone Encounter (Signed)
Patient daughter was made aware with medical recommendations and verbalized understanding ?

## 2021-12-11 ENCOUNTER — Ambulatory Visit: Payer: Medicare PPO | Admitting: Podiatry

## 2021-12-11 ENCOUNTER — Encounter: Payer: Self-pay | Admitting: Podiatry

## 2021-12-11 DIAGNOSIS — E1121 Type 2 diabetes mellitus with diabetic nephropathy: Secondary | ICD-10-CM

## 2021-12-11 DIAGNOSIS — M79676 Pain in unspecified toe(s): Secondary | ICD-10-CM | POA: Diagnosis not present

## 2021-12-11 DIAGNOSIS — B351 Tinea unguium: Secondary | ICD-10-CM | POA: Diagnosis not present

## 2021-12-11 DIAGNOSIS — Z794 Long term (current) use of insulin: Secondary | ICD-10-CM

## 2021-12-11 NOTE — Progress Notes (Signed)
This patient returns to my office for at risk foot care.  This patient requires this care by a professional since this patient will be at risk due to having  Diabetes with diabetic nephropathy.  This patient is unable to cut nails himself since the patient cannot reach his nails.These nails are painful walking and wearing shoes.  This patient presents for at risk foot care today.  General Appearance  Alert, conversant and in no acute stress.  Vascular  Dorsalis pedis and posterior tibial  pulses are palpable  bilaterally.  Capillary return is within normal limits  bilaterally. Temperature is within normal limits  bilaterally.  Neurologic  Senn-Weinstein monofilament wire test diminished  bilaterally. Muscle power within normal limits bilaterally.  Nails Thick disfigured discolored nails with subungual debris  from hallux to fifth toes bilaterally. No evidence of bacterial infection or drainage bilaterally.  Orthopedic  No limitations of motion  feet .  No crepitus or effusions noted.  No bony pathology or digital deformities noted.  HAV  B/L  Skin  normotropic skin with no porokeratosis noted bilaterally.  No signs of infections or ulcers noted.     Onychomycosis  Pain in right toes  Pain in left toes  Consent was obtained for treatment procedures.   Mechanical debridement of nails 1-5  bilaterally performed with a nail nipper.  Filed with dremel without incident.    Return office visit   3   months                   Told patient to return for periodic foot care and evaluation due to potential at risk complications.   Justin Buechner DPM  

## 2021-12-17 ENCOUNTER — Encounter (INDEPENDENT_AMBULATORY_CARE_PROVIDER_SITE_OTHER): Payer: Self-pay | Admitting: Nurse Practitioner

## 2021-12-17 NOTE — Progress Notes (Addendum)
Subjective:    Patient ID: Edgar [name], male    DOB: 01-15-1946, 76 y.o.   MRN: [id number] No chief complaint on file.   Edgar [name] is a 76 y.o. male.   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  He had a CT scan performed 01/02/2020 which I have independently reviewed which showed a stable 4.3 to 4.4 cm ascending thoracic aortic aneurysm.  Due to other concerns such as taking care of his wife who unfortunately recently passed away the patient has not been able to follow-up since 2021.  He has no current chest pain or signs of peripheral embolization.   His blood pressure is generally under fairly good control.  His most recent CT scan on 11/09/2021 shows a continued stable ascending thoracic aneurysm of 4.3 cm.     Review of Systems  All other systems reviewed and are negative.     Objective:   Physical Exam Vitals reviewed.  HENT:     Head: Normocephalic.  Cardiovascular:     Rate and Rhythm: Normal rate.     Pulses: Normal pulses.  Pulmonary:     Effort: Pulmonary effort is normal.  Skin:    General: Skin is warm.  Neurological:     Mental Status: He is alert and oriented to person, place, and time.  Psychiatric:        Mood and Affect: Mood normal.        Behavior: Behavior normal.        Thought Content: Thought content normal.        Judgment: Judgment normal.    BP 116/72 (BP Location: Right Arm)   Pulse 72   Resp 16   Ht 6' (1.829 m)   Wt 280 lb (127 kg)   BMI 37.97 kg/m   Past Medical History:  Diagnosis Date   Aneurysm of aorta ([place]) 09/11/2016   Noted on CT scan Sept 2017   Aneurysm of thoracic aorta ([place]) 09/24/2017   Closed intertrochanteric fracture of left hip, sequela 05/30/2016   Coronary artery disease 10/25/2017   CT scan March 2019   Diabetes mellitus without complication ([place])    Essential hypertension, benign 12/23/2015   History of colonoscopy with  polypectomy 12/07/2016   Polypectomy x 12 per patient   Hx of tobacco use, presenting hazards to health 12/23/2015   Quit prior to 1997   Hx of tobacco use, presenting hazards to health 12/23/2015   Quit prior to 1997    Hyperlipidemia 12/23/2015   Hypertension    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[place]) 05/14/2017   Obesity 12/23/2015   Pulmonary embolism ([place]) 05/18/2016   Hospitalization Sept 2017, following hip fracture   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   left ant lat thigh, SCCis in a SK, schedule EDC   Squamous cell carcinoma of skin 08/14/2021   L ant lat thigh, EDC 09/07/21    Social History   Socioeconomic History   Marital status: Widowed    Spouse name: Not on file   Number of children: Not on file   Years of education: Not on file   Highest education level: Not on file  Occupational History   Not on file  Tobacco Use   Smoking status: Former    Types: Cigarettes    Quit date: 09/01/1978    Years since  quitting: 43.3   Smokeless tobacco: Never  Vaping Use   Vaping Use: Never used  Substance and Sexual Activity   Alcohol use: No    Alcohol/week: 0.0 standard drinks   Drug use: No   Sexual activity: Not Currently  Other Topics Concern   Not on file  Social History Narrative   Pt's daughter lives with him   Social Determinants of Health   Financial Resource Strain: Low Risk    Difficulty of Paying Living Expenses: Not hard at all  Food Insecurity: No Food Insecurity   Worried About Charity fundraiser in the Last Year: Never true   Arboriculturist in the Last Year: Never true  Transportation Needs: No Transportation Needs   Lack of Transportation (Medical): No   Lack of Transportation (Non-Medical): No  Physical Activity: Sufficiently Active   Days of Exercise per Week: 5 days   Minutes of Exercise per Session: 30 min  Stress: No Stress Concern Present   Feeling of Stress : Not at all  Social Connections: Moderately  Isolated   Frequency of Communication with Friends and Family: More than three times a week   Frequency of Social Gatherings with Friends and Family: More than three times a week   Attends Religious Services: More than 4 times per year   Active Member of [employer] or Organizations: No   Attends Archivist Meetings: Never   Marital Status: Widowed  Intimate Partner Violence: Not At Risk   Fear of Current or Ex-Partner: No   Emotionally Abused: No   Physically Abused: No   Sexually Abused: No    Past Surgical History:  Procedure Laterality Date   COLONOSCOPY WITH PROPOFOL N/A 01/15/2017   Procedure: COLONOSCOPY WITH PROPOFOL;  Surgeon: Jonathon Bellows, MD;  Location: [facility] ENDOSCOPY;  Service: Endoscopy;  Laterality: N/A;   COLONOSCOPY WITH PROPOFOL N/A 12/17/2019   Procedure: COLONOSCOPY WITH PROPOFOL;  Surgeon: Jonathon Bellows, MD;  Location: [facility] ENDOSCOPY;  Service: Gastroenterology;  Laterality: N/A;   EYE SURGERY     FRACTURE SURGERY     INTRAMEDULLARY (IM) NAIL INTERTROCHANTERIC Left 04/14/2016   Procedure: INTRAMEDULLARY (IM) NAIL INTERTROCHANTRIC;  Surgeon: Corky Mull, MD;  Location: [facility] ORS;  Service: Orthopedics;  Laterality: Left;   JOINT REPLACEMENT Left 04/2016   TOTAL HIP ARTHROPLASTY      Family History  Problem Relation Age of Onset   Cancer Mother    Diabetes Mother    Heart disease Father    Alcohol abuse Brother     No Known Allergies     Latest Ref Rng & Units 08/23/2021    1:47 PM 11/12/2020   11:42 AM 10/28/2020    9:39 AM  CBC  WBC 3.8 - 10.8 Thousand/uL 7.1   5.9   6.2    Hemoglobin 13.2 - 17.1 g/dL 15.5   15.4   15.4    Hematocrit 38.5 - 50.0 % 47.9   45.8   47.2    Platelets 140 - 400 Thousand/uL 257   255   260        CMP     Component Value Date/Time   NA 139 08/23/2021 1347   NA 139 01/06/2016 0918   K 4.5 08/23/2021 1347   CL 104 08/23/2021 1347   CO2 32 08/23/2021 1347   GLUCOSE 123 (H) 08/23/2021 1347   BUN 16 08/23/2021 1347   BUN  16 01/06/2016 0918   CREATININE 1.20 11/09/2021 1358   CREATININE  1.10 08/23/2021 1347   CALCIUM 10.2 08/23/2021 1347   PROT 7.2 08/23/2021 1347   PROT 7.5 12/23/2015 0959   ALBUMIN 4.0 12/07/2016 0957   ALBUMIN 3.8 01/06/2016 0918   AST 16 08/23/2021 1347   ALT 14 08/23/2021 1347   ALKPHOS 87 12/07/2016 0957   BILITOT 0.5 08/23/2021 1347   BILITOT 0.5 12/23/2015 0959   GFRNONAA >60 11/12/2020 1142   GFRNONAA 69 08/01/2020 0946   GFRAA 80 08/01/2020 0946     No results found.     Assessment & Plan:   1. Aneurysm of ascending aorta without rupture ([place]) I have independently reviewed his most recent CT scan with that was still almost a year ago at this point.  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  No role for intervention at that level.  Would continue surveillance studies every year or so with CT scan we will obtain a CT scan in the next year at his convenience   2. Essential hypertension, benign Continue antihypertensive medications as already ordered, these medications have been reviewed and there are no changes at this time.   3. Mixed hyperlipidemia Continue statin as ordered and reviewed, no changes at this time    Current Outpatient Medications on File Prior to Visit  Medication Sig Dispense Refill   ACCU-CHEK GUIDE test strip USE IN THE MORNING AT NOON AND AT BEDTIME E11.21 100 strip 6   Accu-Chek Softclix Lancets lancets USE AS INSTRUCTED DX ELL.21, CHECK BG TWICE A DAY 100 each 12   acetaminophen (TYLENOL) 500 MG tablet Take 500 mg by mouth every 6 (six) hours as needed.     amLODipine (NORVASC) 10 MG tablet Take 1 tablet (10 mg total) by mouth daily. 90 tablet 3   aspirin EC 81 MG tablet Take 81 mg by mouth daily. Swallow whole.     atorvastatin (LIPITOR) 40 MG tablet TAKE 1 TABLET BY MOUTH EVERYDAY AT BEDTIME 90 tablet 3   Blood Glucose Monitoring Suppl (ONE TOUCH ULTRA 2) w/Device KIT Check fingerstick  blood sugars three times a day; DX E11.65, LON 99 months 1 each 0   Blood Glucose Monitoring Suppl (ONE TOUCH ULTRA MINI) w/Device KIT 1 Device by Does not apply route in the morning, at noon, and at bedtime. E11.21, LON:99 check BG tid 1 kit 0   Butalbital-APAP-Caffeine 50-325-40 MG capsule      cholecalciferol (VITAMIN D) 1000 units tablet Take 1,000 Units by mouth 2 (two) times daily.      empagliflozin (JARDIANCE) 25 MG TABS tablet Take 1 tablet (25 mg total) by mouth daily. 90 tablet 3   fluticasone (FLONASE) 50 MCG/ACT nasal spray Place 2 sprays into both nostrils daily. 16 g 6   HUMALOG KWIKPEN 100 UNIT/ML KwikPen INJECT 10 UNITS TOTAL INTO THE SKIN 3 (THREE) TIMES DAILY. 15 mL 6   insulin aspart (NOVOLOG FLEXPEN) 100 UNIT/ML FlexPen Novolog FlexPen U-100 Insulin aspart 100 unit/mL (3 mL) subcutaneous     Insulin Glargine (BASAGLAR KWIKPEN) 100 UNIT/ML Inject 60 Units into the skin at bedtime. Lilly Cares 15 mL 0   Insulin Pen Needle (B-D UF III MINI PEN NEEDLES) 31G X 5 MM MISC USE ONCE A DAY WITH LONG-ACTING INSULIN, AND UP TO 3X A DAY WITH SHORT-ACTING INSULIN 300 each 3   Lancets (ONETOUCH ULTRASOFT) lancets E11.21, Lon:99 check BG tid 100 each 12   losartan (COZAAR) 100 MG tablet Take 1 tablet (100 mg  total) by mouth daily. 90 tablet 1   No current facility-administered medications on file prior to visit.    There are no Patient Instructions on file for this visit. No follow-ups on file.   Kris Hartmann, NP

## 2021-12-21 ENCOUNTER — Ambulatory Visit: Payer: Medicare PPO | Admitting: Nurse Practitioner

## 2021-12-21 ENCOUNTER — Other Ambulatory Visit: Payer: Self-pay

## 2021-12-21 ENCOUNTER — Encounter: Payer: Self-pay | Admitting: Nurse Practitioner

## 2021-12-21 VITALS — BP 130/78 | HR 82 | Temp 98.5°F | Resp 18 | Ht 73.0 in | Wt 275.2 lb

## 2021-12-21 DIAGNOSIS — I700 Atherosclerosis of aorta: Secondary | ICD-10-CM | POA: Diagnosis not present

## 2021-12-21 DIAGNOSIS — E1159 Type 2 diabetes mellitus with other circulatory complications: Secondary | ICD-10-CM | POA: Diagnosis not present

## 2021-12-21 DIAGNOSIS — E1169 Type 2 diabetes mellitus with other specified complication: Secondary | ICD-10-CM

## 2021-12-21 DIAGNOSIS — I7121 Aneurysm of the ascending aorta, without rupture: Secondary | ICD-10-CM

## 2021-12-21 DIAGNOSIS — Z794 Long term (current) use of insulin: Secondary | ICD-10-CM

## 2021-12-21 DIAGNOSIS — E1121 Type 2 diabetes mellitus with diabetic nephropathy: Secondary | ICD-10-CM

## 2021-12-21 DIAGNOSIS — I10 Essential (primary) hypertension: Secondary | ICD-10-CM

## 2021-12-21 DIAGNOSIS — I152 Hypertension secondary to endocrine disorders: Secondary | ICD-10-CM | POA: Diagnosis not present

## 2021-12-21 DIAGNOSIS — E785 Hyperlipidemia, unspecified: Secondary | ICD-10-CM

## 2021-12-21 LAB — POCT GLYCOSYLATED HEMOGLOBIN (HGB A1C): Hemoglobin A1C: 7 % — AB (ref 4.0–5.6)

## 2021-12-21 NOTE — Assessment & Plan Note (Signed)
Recently had repeat CT which showed aneurysm is stable at 4.3 cm.

## 2021-12-21 NOTE — Assessment & Plan Note (Signed)
Continue taking atorvastatin 40 mg daily. 

## 2021-12-21 NOTE — Assessment & Plan Note (Signed)
Blood pressure 130/78 today.  Continue taking losartan 100 mg and amlodipine 10 mg daily.

## 2021-12-21 NOTE — Assessment & Plan Note (Signed)
Continue taking Jardiance 25 mg daily, Humalog 10 units 3 times daily and Basaglar 60 units at night.

## 2021-12-21 NOTE — Assessment & Plan Note (Signed)
Continue to being physically active as tolerated.

## 2021-12-21 NOTE — Assessment & Plan Note (Signed)
Last LDL was 64 on 08/23/2021.  Continue taking atorvastatin 40 mg daily.

## 2021-12-21 NOTE — Progress Notes (Signed)
BP 130/78   Pulse 82   Temp 98.5 F (36.9 C) (Oral)   Resp 18   Ht '6\' 1"'$  (1.854 m)   Wt 275 lb 3.2 oz (124.8 kg)   SpO2 95%   BMI 36.31 kg/m    Subjective:    Patient ID: Edgar [name], male    DOB: 11-Oct-1945, 76 y.o.   MRN: [id number]  HPI: Edgar [name] is a 76 y.o. male  Chief Complaint  Patient presents with   Hypertension   Hyperlipidemia   Diabetes    4 month follow up   Hypertension: His blood pressure today is 130/78.  Patient does not check his blood pressure at home.  He denies any chest pain, shortness of breath, blurred vision or headaches.  He is currently taking losartan 100 mg and amlodipine 10 mg daily.  We will continue with current treatment plan.  Type 2 diabetes: Patient reports that his blood sugar normally runs about 130s.  He is currently on Jardiance 25 mg daily Humalog 10 units 3 times daily and Basaglar 60 units at night.  He denies having any polyuria, polyphagia or polydipsia.  His last A1c was 6.3 on 08/23/2021.  His A1c today is 7.0.  Hyperlipidemia/aortic atherosclerosis: His last LDL was 64 on 08/23/2021.  Currently taking atorvastatin 40 mg daily.  He denies any myalgia.  Continue with current treatment plan.  Aneurysm of ascending aorta without rupture: His CT of his chest on 12/02/2019. Ordered by Dr Dian Situ.  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 Recommend annual imaging followup by CTA or MRA.  On 11/09/2021 he had another CT of his chest which showedThere is ectasia of thoracic aorta. Ascending thoracic aorta measures 4.3 cm in diameter with no significant interval change. There is no evidence of thoracic aortic dissection.  Aneurysm continues to be stable.  Last saw vascular on 09/18/2021, no changes in his treatment plan.  Obesity: His current weight is 275 pounds with a BMI of 36.31.  He says he stays very busy.  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   Relevant past medical, surgical, family and social  history reviewed and updated as indicated. Interim medical history since our last visit reviewed. Allergies and medications reviewed and updated.  Review of Systems  Constitutional: Negative for fever or weight change.  Respiratory: Negative for cough and shortness of breath.   Cardiovascular: Negative for chest pain or palpitations.  Gastrointestinal: Negative for abdominal pain, no bowel changes.  Musculoskeletal: Negative for gait problem or joint swelling.  Skin: Negative for rash.  Neurological: Negative for dizziness or headache.  No other specific complaints in a complete review of systems (except as listed in HPI above).      Objective:    BP 130/78   Pulse 82   Temp 98.5 F (36.9 C) (Oral)   Resp 18   Ht '6\' 1"'$  (1.854 m)   Wt 275 lb 3.2 oz (124.8 kg)   SpO2 95%   BMI 36.31 kg/m   Wt Readings from Last 3 Encounters:  12/21/21 275 lb 3.2 oz (124.8 kg)  12/04/21 280 lb (127 kg)  09/18/21 277 lb (125.6 kg)    Physical Exam  Constitutional: Patient appears well-developed and well-nourished. Obese  No distress.  HEENT: head atraumatic, normocephalic, pupils equal and reactive to light, neck supple Cardiovascular: Normal rate, regular rhythm and normal heart sounds.  No murmur heard. No BLE edema. Pulmonary/Chest: Effort normal and breath  sounds normal. No respiratory distress. Abdominal: Soft.  There is no tenderness. Psychiatric: Patient has a normal mood and affect. behavior is normal. Judgment and thought content normal.  Results for orders placed or performed in visit on 12/21/21  POCT glycosylated hemoglobin (Hb A1C)  Result Value Ref Range   Hemoglobin A1C 7.0 (A) 4.0 - 5.6 %   HbA1c POC (<> result, manual entry)     HbA1c, POC (prediabetic range)     HbA1c, POC (controlled diabetic range)        Assessment & Plan:   Problem List Items Addressed This Visit       Cardiovascular and Mediastinum   RESOLVED: Essential hypertension, benign (Chronic)    Blood  pressure 130/78 today.  Continue taking losartan 100 mg and amlodipine 10 mg daily.       Aneurysm of aorta (HCC)    Recently had repeat CT which showed aneurysm is stable at 4.3 cm.       Aortic atherosclerosis (HCC)    Continue taking atorvastatin 40 mg daily.       Hypertension associated with type 2 diabetes mellitus ([place]) - Primary    Blood pressure 130/78 today.  Continue taking losartan 100 mg and amlodipine 10 mg daily.        Endocrine   Type 2 diabetes mellitus with diabetic nephropathy, 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HCC)    Continue taking Jardiance 25 mg daily, Humalog 10 units 3 times daily and Basaglar 60 units at night.         Relevant Orders   POCT glycosylated hemoglobin (Hb A1C) (Completed)   Hyperlipidemia associated with type 2 diabetes mellitus ([place])    Last LDL was 64 on 08/23/2021.  Continue taking atorvastatin 40 mg daily.         Other   Morbid obesity ([place]) (Chronic)    Continue to being physically active as tolerated.          Follow up plan: Return in about 4 months (around 04/23/2022) for follow up.

## 2021-12-22 NOTE — Addendum Note (Signed)
Addended by: Kris Hartmann on: 12/22/2021 02:32 PM   Modules accepted: Orders

## 2022-01-04 ENCOUNTER — Encounter: Payer: Self-pay | Admitting: Nurse Practitioner

## 2022-01-04 ENCOUNTER — Other Ambulatory Visit: Payer: Self-pay

## 2022-01-04 ENCOUNTER — Ambulatory Visit: Payer: Medicare PPO | Admitting: Nurse Practitioner

## 2022-01-04 VITALS — BP 130/80 | HR 96 | Temp 98.2°F | Resp 18 | Ht 73.0 in | Wt 277.1 lb

## 2022-01-04 DIAGNOSIS — Z794 Long term (current) use of insulin: Secondary | ICD-10-CM

## 2022-01-04 DIAGNOSIS — E1121 Type 2 diabetes mellitus with diabetic nephropathy: Secondary | ICD-10-CM

## 2022-01-04 DIAGNOSIS — I700 Atherosclerosis of aorta: Secondary | ICD-10-CM

## 2022-01-04 DIAGNOSIS — E1159 Type 2 diabetes mellitus with other circulatory complications: Secondary | ICD-10-CM | POA: Diagnosis not present

## 2022-01-04 DIAGNOSIS — Z0289 Encounter for other administrative examinations: Secondary | ICD-10-CM | POA: Diagnosis not present

## 2022-01-04 DIAGNOSIS — I152 Hypertension secondary to endocrine disorders: Secondary | ICD-10-CM | POA: Diagnosis not present

## 2022-01-04 NOTE — Assessment & Plan Note (Signed)
Last A1c at goal.  Forms filled out for [facility].

## 2022-01-04 NOTE — Assessment & Plan Note (Signed)
Continue taking atorvastatin.  Last LDL was at goal.  Forms completed for [employer].

## 2022-01-04 NOTE — Assessment & Plan Note (Signed)
Forms completed for DMV.  Blood pressure at goal 130/80.

## 2022-01-04 NOTE — Progress Notes (Signed)
BP 130/80   Pulse 96   Temp 98.2 F (36.8 C) (Oral)   Resp 18   Ht '6\' 1"'$  (1.854 m)   Wt 277 lb 1.6 oz (125.7 kg)   SpO2 96%   BMI 36.56 kg/m    Subjective:    Patient ID: Edgar [name], male    DOB: 1945/10/19, 76 y.o.   MRN: [id number]  HPI: Edgar [name] is a 76 y.o. male  Chief Complaint  Patient presents with   Consult    Discuss paperwork   Hypertension: Patient seen on 12/21/2021 for his chronic conditions.  He is here today to get his paperwork filled out for the [facility].  His blood pressure is under control.  Today's blood pressure was 130/80.  Continue with current treatment plan.  Type 2 diabetes: Patient seen here on 12/21/2021 for his chronic conditions.  He is here today to get his paperwork filled out for the [facility].  His last A1c was 6.3 on August 23, 2021.  Patient denies ever having any episodes of hypoglycemia.  Patient does know the signs and symptoms of hypoglycemia.  He does treat his diabetes with insulin.  We will continue with current treatment.  Aortic atherosclerosis: Patient currently taking atorvastatin 40 mg daily.  He denies any myalgia.  He says he does take his medicine every day.  Continue current treatment plan.  Relevant past medical, surgical, family and social history reviewed and updated as indicated. Interim medical history since our last visit reviewed. Allergies and medications reviewed and updated.  Review of Systems  Constitutional: Negative for fever or weight change.  Respiratory: Negative for cough and shortness of breath.   Cardiovascular: Negative for chest pain or palpitations.  Gastrointestinal: Negative for abdominal pain, no bowel changes.  Musculoskeletal: Negative for gait problem or joint swelling.  Skin: Negative for rash.  Neurological: Negative for dizziness or headache.  No other specific complaints in a complete review of systems (except as listed in HPI above).      Objective:    BP 130/80   Pulse 96   Temp 98.2 F  (36.8 C) (Oral)   Resp 18   Ht '6\' 1"'$  (1.854 m)   Wt 277 lb 1.6 oz (125.7 kg)   SpO2 96%   BMI 36.56 kg/m   Wt Readings from Last 3 Encounters:  01/04/22 277 lb 1.6 oz (125.7 kg)  12/21/21 275 lb 3.2 oz (124.8 kg)  12/04/21 280 lb (127 kg)    Physical Exam  Results for orders placed or performed in visit on 12/21/21  POCT glycosylated hemoglobin (Hb A1C)  Result Value Ref Range   Hemoglobin A1C 7.0 (A) 4.0 - 5.6 %   HbA1c POC (<> result, manual entry)     HbA1c, POC (prediabetic range)     HbA1c, POC (controlled diabetic range)        Assessment & Plan:   Problem List Items Addressed This Visit       Cardiovascular and Mediastinum   Aortic atherosclerosis ([place])    Continue taking atorvastatin.  Last LDL was at goal.  Forms completed for DMV.       Hypertension associated with type 2 diabetes mellitus ([place]) - Primary    Forms completed for DMV.  Blood pressure at goal 130/80.         Endocrine   Type 2 diabetes mellitus with diabetic nephropathy, 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HCC)    Last A1c at goal.  Forms  filled out for DMV.       Other Visit Diagnoses     Encounter for completion of form with patient       Forms completed for DMV.        Follow up plan: No follow-ups on file.

## 2022-01-09 ENCOUNTER — Ambulatory Visit (INDEPENDENT_AMBULATORY_CARE_PROVIDER_SITE_OTHER): Payer: Medicare PPO

## 2022-01-09 DIAGNOSIS — Z0000 Encounter for general adult medical examination without abnormal findings: Secondary | ICD-10-CM

## 2022-01-09 NOTE — Patient Instructions (Signed)
Edgar [name] , Thank you for taking time to come for your Medicare Wellness Visit. I appreciate your ongoing commitment to your health goals. Please review the following plan we discussed and let me know if I can assist you in the future.   Screening recommendations/referrals: Colonoscopy: done 12/18/19. Repeat 12/2024 Recommended yearly ophthalmology/optometry visit for glaucoma screening and checkup Recommended yearly dental visit for hygiene and checkup  Vaccinations: Influenza vaccine: done 04/24/21 Pneumococcal vaccine: done 08/26/17 Tdap vaccine: due Shingles vaccine: Shingrix discussed. Please contact your pharmacy for coverage information.  Covid-19: done 09/18/19, 10/16/19 & 07/05/20  Advanced directives: Please bring a copy of your health care power of attorney and living will to the office at your convenience.   Conditions/risks identified: Keep up the great work!  Next appointment: Follow up in one year for your annual wellness visit.   Preventive Care 8 Years and Older, Male Preventive care refers to lifestyle choices and visits with your health care provider that can promote health and wellness. What does preventive care include? A yearly physical exam. This is also called an annual well check. Dental exams once or twice a year. Routine eye exams. Ask your health care provider how often you should have your eyes checked. Personal lifestyle choices, including: Daily care of your teeth and gums. Regular physical activity. Eating a healthy diet. Avoiding tobacco and drug use. Limiting alcohol use. Practicing safe sex. Taking low doses of aspirin every day. Taking vitamin and mineral supplements as recommended by your health care provider. What happens during an annual well check? The services and screenings done by your health care provider during your annual well check will depend on your age, overall health, lifestyle risk factors, and family history of disease. Counseling   Your health care provider may ask you questions about your: Alcohol use. Tobacco use. Drug use. Emotional well-being. Home and relationship well-being. Sexual activity. Eating habits. History of falls. Memory and ability to understand (cognition). Work and work Statistician. Screening  You may have the following tests or measurements: Height, weight, and BMI. Blood pressure. Lipid and cholesterol levels. These may be checked every 5 years, or more frequently if you are over 41 years old. Skin check. Lung cancer screening. You may have this screening every year starting at age 32 if you have a 30-pack-year history of smoking and currently smoke or have quit within the past 15 years. Fecal occult blood test (FOBT) of the stool. You may have this test every year starting at age 33. Flexible sigmoidoscopy or colonoscopy. You may have a sigmoidoscopy every 5 years or a colonoscopy every 10 years starting at age 46. Prostate cancer screening. Recommendations will vary depending on your family history and other risks. Hepatitis C blood test. Hepatitis B blood test. Sexually transmitted disease (STD) testing. Diabetes screening. This is done by checking your blood sugar (glucose) after you have not eaten for a while (fasting). You may have this done every 1-3 years. Abdominal aortic aneurysm (AAA) screening. You may need this if you are a current or former smoker. Osteoporosis. You may be screened starting at age 18 if you are at high risk. Talk with your health care provider about your test results, treatment options, and if necessary, the need for more tests. Vaccines  Your health care provider may recommend certain vaccines, such as: Influenza vaccine. This is recommended every year. Tetanus, diphtheria, and acellular pertussis (Tdap, Td) vaccine. You may need a Td booster every 10 years. Zoster vaccine. You may  need this after age 94. Pneumococcal 13-valent conjugate (PCV13) vaccine.  One dose is recommended after age 62. Pneumococcal polysaccharide (PPSV23) vaccine. One dose is recommended after age 79. Talk to your health care provider about which screenings and vaccines you need and how often you need them. This information is not intended to replace advice given to you by your health care provider. Make sure you discuss any questions you have with your health care provider. Document Released: 08/19/2015 Document Revised: 04/11/2016 Document Reviewed: 05/24/2015 Elsevier Interactive Patient Education  2017 [place] Prevention in the Home Falls can cause injuries. They can happen to people of all ages. There are many things you can do to make your home safe and to help prevent falls. What can I do on the outside of my home? Regularly fix the edges of walkways and driveways and fix any cracks. Remove anything that might make you trip as you walk through a door, such as a raised step or threshold. Trim any bushes or trees on the path to your home. Use bright outdoor lighting. Clear any walking paths of anything that might make someone trip, such as rocks or tools. Regularly check to see if handrails are loose or broken. Make sure that both sides of any steps have handrails. Any raised decks and porches should have guardrails on the edges. Have any leaves, snow, or ice cleared regularly. Use sand or salt on walking paths during winter. Clean up any spills in your garage right away. This includes oil or grease spills. What can I do in the bathroom? Use night lights. Install grab bars by the toilet and in the tub and shower. Do not use towel bars as grab bars. Use non-skid mats or decals in the tub or shower. If you need to sit down in the shower, use a plastic, non-slip stool. Keep the floor dry. Clean up any water that spills on the floor as soon as it happens. Remove soap buildup in the tub or shower regularly. Attach bath mats securely with double-sided  non-slip rug tape. Do not have throw rugs and other things on the floor that can make you trip. What can I do in the bedroom? Use night lights. Make sure that you have a light by your bed that is easy to reach. Do not use any sheets or blankets that are too big for your bed. They should not hang down onto the floor. Have a firm chair that has side arms. You can use this for support while you get dressed. Do not have throw rugs and other things on the floor that can make you trip. What can I do in the kitchen? Clean up any spills right away. Avoid walking on wet floors. Keep items that you use a lot in easy-to-reach places. If you need to reach something above you, use a strong step stool that has a grab bar. Keep electrical cords out of the way. Do not use floor polish or wax that makes floors slippery. If you must use wax, use non-skid floor wax. Do not have throw rugs and other things on the floor that can make you trip. What can I do with my stairs? Do not leave any items on the stairs. Make sure that there are handrails on both sides of the stairs and use them. Fix handrails that are broken or loose. Make sure that handrails are as long as the stairways. Check any carpeting to make sure that it is firmly attached  to the stairs. Fix any carpet that is loose or worn. Avoid having throw rugs at the top or bottom of the stairs. If you do have throw rugs, attach them to the floor with carpet tape. Make sure that you have a light switch at the top of the stairs and the bottom of the stairs. If you do not have them, ask someone to add them for you. What else can I do to help prevent falls? Wear shoes that: Do not have high heels. Have rubber bottoms. Are comfortable and fit you well. Are closed at the toe. Do not wear sandals. If you use a stepladder: Make sure that it is fully opened. Do not climb a closed stepladder. Make sure that both sides of the stepladder are locked into place. Ask  someone to hold it for you, if possible. Clearly mark and make sure that you can see: Any grab bars or handrails. First and last steps. Where the edge of each step is. Use tools that help you move around (mobility aids) if they are needed. These include: Canes. Walkers. Scooters. Crutches. Turn on the lights when you go into a dark area. Replace any light bulbs as soon as they burn out. Set up your furniture so you have a clear path. Avoid moving your furniture around. If any of your floors are uneven, fix them. If there are any pets around you, be aware of where they are. Review your medicines with your doctor. Some medicines can make you feel dizzy. This can increase your chance of falling. Ask your doctor what other things that you can do to help prevent falls. This information is not intended to replace advice given to you by your health care provider. Make sure you discuss any questions you have with your health care provider. Document Released: 05/19/2009 Document Revised: 12/29/2015 Document Reviewed: 08/27/2014 Elsevier Interactive Patient Education  2017 [employer].

## 2022-01-09 NOTE — Progress Notes (Signed)
Subjective:   Edgar [name] is a 76 y.o. male who presents for Medicare Annual/Subsequent preventive examination.  Virtual Visit via Telephone Note  I connected with  Edgar [name] on 01/09/22 at  9:00 AM EDT by telephone and verified that I am speaking with the correct person using two identifiers.  Location: Patient: home Provider: [place] Persons participating in the virtual visit: [place]   I discussed the limitations, risks, security and privacy concerns of performing an evaluation and management service by telephone and the availability of in person appointments. The patient expressed understanding and agreed to proceed.  Interactive audio and video telecommunications were attempted between this nurse and patient, however failed, due to patient having technical difficulties OR patient did not have access to video capability.  We continued and completed visit with audio only.  Some vital signs may be absent or patient reported.   Edgar Marker, LPN   Review of Systems    Cardiac Risk Factors include: advanced age (>11mn, >>74women);diabetes mellitus;dyslipidemia;male gender;hypertension;obesity (BMI >30kg/m2)     Objective:    There were no vitals filed for this visit. There is no height or weight on file to calculate BMI.     01/09/2022    9:09 AM 01/05/2021    9:07 AM 11/12/2020   10:56 AM 01/02/2020    2:00 PM 12/17/2019    8:02 AM 11/19/2019   10:12 AM 10/17/2018    9:36 AM  Advanced Directives  Does Patient Have a Medical Advance Directive? Yes No No No No No No  Type of [place] will        Copy of [place]? No - copy requested        Would patient like information on creating a medical advance directive?  Yes (MAU/Ambulatory/Procedural Areas - Information given)  No - Patient declined  No - Patient declined No - Patient declined    Current Medications (verified) Outpatient  Encounter Medications as of 01/09/2022  Medication Sig   ACCU-CHEK GUIDE test strip    acetaminophen (TYLENOL) 500 MG tablet Take 500 mg by mouth every 6 (six) hours as needed.   amLODipine (NORVASC) 10 MG tablet Take 1 tablet (10 mg total) by mouth daily.   aspirin EC 81 MG tablet Take 81 mg by mouth daily. Swallow whole.   atorvastatin (LIPITOR) 40 MG tablet TAKE 1 TABLET BY MOUTH EVERYDAY AT BEDTIME   Butalbital-APAP-Caffeine 50-325-40 MG capsule    cholecalciferol (VITAMIN D) 1000 units tablet Take 1,000 Units by mouth 2 (two) times daily.    empagliflozin (JARDIANCE) 25 MG TABS tablet Take 1 tablet (25 mg total) by mouth daily.   fluticasone (FLONASE) 50 MCG/ACT nasal spray Place 2 sprays into both nostrils daily.   HUMALOG KWIKPEN 100 UNIT/ML KwikPen INJECT 10 UNITS TOTAL INTO THE SKIN 3 (THREE) TIMES DAILY.   Insulin Glargine (BASAGLAR KWIKPEN) 100 UNIT/ML Inject 60 Units into the skin at bedtime. Lilly Cares   losartan (COZAAR) 100 MG tablet Take 1 tablet (100 mg total) by mouth daily.   [DISCONTINUED] insulin aspart (NOVOLOG FLEXPEN) 100 UNIT/ML FlexPen Novolog FlexPen U-100 Insulin aspart 100 unit/mL (3 mL) subcutaneous   No facility-administered encounter medications on file as of 01/09/2022.    Allergies (verified) Patient has no known allergies.   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   Closed intertrochanteric fracture of left hip, sequela 05/30/2016   Coronary artery disease 10/25/2017   CT scan March 2019   Diabetes mellitus without complication ([place])    Essential hypertension, benign 12/23/2015   History of colonoscopy with polypectomy 12/07/2016   Polypectomy x 12 per patient   Hx of tobacco use, presenting hazards to health 12/23/2015   Quit prior to 1997   Hx of tobacco use, presenting hazards to health 12/23/2015   Quit prior to 1997     Hyperlipidemia 12/23/2015   Hypertension    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   Obesity 12/23/2015   Pulmonary embolism ([place]) 05/18/2016   Hospitalization Sept 2017, following hip fracture   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   left ant lat thigh, SCCis in a SK, schedule EDC   Squamous cell carcinoma of skin 08/14/2021   L ant lat thigh, EDC 09/07/21   Past Surgical History:  Procedure Laterality Date   COLONOSCOPY WITH PROPOFOL N/A 01/15/2017   Procedure: COLONOSCOPY WITH PROPOFOL;  Surgeon: Jonathon Bellows, MD;  Location[facility] ENDOSCOPY;  Service: Endoscopy;  Laterality: N/A;   COLONOSCOPY WITH PROPOFOL N/A 12/17/2019   Procedure: COLONOSCOPY WITH PROPOFOL;  Surgeon: Jonathon Bellows, MD;  Location: [facility] ENDOSCOPY;  Service: Gastroenterology;  Laterality: N/A;   EYE SURGERY     FRACTURE SURGERY     INTRAMEDULLARY (IM) NAIL INTERTROCHANTERIC Left 04/14/2016   Procedure: INTRAMEDULLARY (IM) NAIL INTERTROCHANTRIC;  Surgeon: Corky Mull, MD;  Location: [facility] ORS;  Service: Orthopedics;  Laterality: Left;   JOINT REPLACEMENT Left 04/2016   TOTAL HIP ARTHROPLASTY     Family History  Problem Relation Age of Onset   Cancer Mother    Diabetes Mother    Heart disease Father    Alcohol abuse Brother    Social History   Socioeconomic History   Marital status: Widowed    Spouse name: Not on file   Number of children: Not on file   Years of education: Not on file   Highest education level: Not on file  Occupational History   Not on file  Tobacco Use   Smoking status: Former    Types: Cigarettes    Quit date: 09/01/1978    Years since quitting: 43.3   Smokeless tobacco: Never  Vaping Use   Vaping Use: Never used  Substance and Sexual Activity   Alcohol use: No    Alcohol/week: 0.0 standard drinks   Drug use: No   Sexual activity: Not Currently  Other Topics Concern   Not on file  Social History Narrative   Pt's daughter lives with him    Social Determinants of Health   Financial Resource Strain: Low Risk    Difficulty of Paying Living Expenses: Not hard at all  Food Insecurity: No Food Insecurity   Worried About Charity fundraiser in the Last Year: Never true   Arboriculturist in the Last Year: Never true  Transportation Needs: No Transportation Needs   Lack of Transportation (Medical): No   Lack of Transportation (Non-Medical): No  Physical Activity: Sufficiently Active   Days of Exercise per Week: 5 days   Minutes of Exercise per Session: 30 min  Stress: No Stress Concern Present   Feeling of Stress : Not at all  Social Connections: Moderately Isolated   Frequency of Communication with Friends and Family: More than three times a week   Frequency of  Social Gatherings with Friends and Family: More than three times a week   Attends Religious Services: More than 4 times per year   Active Member of [employer] or Organizations: No   Attends Archivist Meetings: Never   Marital Status: Widowed    Tobacco Counseling Counseling given: Not Answered   Clinical Intake:  Pre-visit preparation completed: Yes  Pain : No/denies pain     Nutritional Risks: None Diabetes: Yes CBG done?: No Did pt. bring in CBG monitor from home?: No  How often do you need to have someone help you when you read instructions, pamphlets, or other written materials from your doctor or pharmacy?: 1 - Never  Nutrition Risk Assessment:  Has the patient had any N/V/D within the last 2 months?  No  Does the patient have any non-healing wounds?  No  Has the patient had any unintentional weight loss or weight gain?  No   Diabetes:  Is the patient diabetic?  Yes  If diabetic, was a CBG obtained today?  No  Did the patient bring in their glucometer from home?  No  How often do you monitor your CBG's? Twice daily.   Financial Strains and Diabetes Management:  Are you having any financial strains with the device, your supplies or  your medication? No .  Does the patient want to be seen by Chronic Care Management for management of their diabetes?  No  Would the patient like to be referred to a Nutritionist or for Diabetic Management?  No   Diabetic Exams:  Diabetic Eye Exam: Completed 11/01/21.   Diabetic Foot Exam: Completed 08/23/21.   Interpreter Needed?: No  Information entered by :: Edgar Marker LPN   Activities of Daily Living    01/09/2022    9:12 AM 01/04/2022    8:43 AM  In your present state of health, do you have any difficulty performing the following activities:  Hearing? 1 0  Vision? 0 0  Difficulty concentrating or making decisions? 0 0  Walking or climbing stairs? 0 0  Dressing or bathing? 0 0  Doing errands, shopping? 0 0  Preparing Food and eating ? N   Using the Toilet? N   In the past six months, have you accidently leaked urine? N   Do you have problems with loss of bowel control? N   Managing your Medications? N   Managing your Finances? N   Housekeeping or managing your Housekeeping? N     Patient Care Team: Delsa Grana, PA-C as PCP - General (Family Medicine) Germaine Pomfret, [facility] as Pharmacist (Pharmacist) [name], DPM as Consulting Physician (Podiatry) Ortho, [facility]  Indicate any recent Medical Services you may have received from other than [facility] providers in the past year (date may be approximate).     Assessment:   This is a routine wellness examination for Edgar [name].  Hearing/Vision screen Hearing Screening - Comments:: Pt has hearing aids but does not wear them often  Vision Screening - Comments:: Annual vision screenings done at [facility] issues and exercise activities discussed: Current Exercise Habits: Home exercise routine, Type of exercise: walking, Time (Minutes): 30, Frequency (Times/Week): 5, Weekly Exercise (Minutes/Week): 150, Intensity: Mild, Exercise limited by: None identified   Goals Addressed   None    Depression  Screen    01/09/2022    9:09 AM 01/04/2022    8:43 AM 12/21/2021   10:19 AM 08/23/2021    1:15 PM 02/16/2021    9:24  AM 01/17/2021    9:28 AM 01/05/2021    9:05 AM  PHQ 2/9 Scores  PHQ - 2 Score 0 0 0 0 0 0 0  PHQ- 9 Score    0       Fall Risk    01/09/2022    9:12 AM 01/04/2022    8:42 AM 12/21/2021   10:19 AM 08/23/2021    1:15 PM 02/16/2021    9:23 AM  Fall Risk   Falls in the past year? 0 0 0 0 0  Number falls in past yr: 0 0 0 0 0  Injury with Fall? 0 0 0 0 0  Risk for fall due to : No Fall Risks      Follow up Falls prevention discussed Falls evaluation completed Falls evaluation completed  Falls evaluation completed    FALL RISK PREVENTION PERTAINING TO THE HOME:  Any stairs in or around the home? No  If so, are there any without handrails? No  Home free of loose throw rugs in walkways, pet beds, electrical cords, etc? Yes  Adequate lighting in your home to reduce risk of falls? Yes   ASSISTIVE DEVICES UTILIZED TO PREVENT FALLS:  Life alert? No  Use of a cane, walker or w/c? No  Grab bars in the bathroom? No  Shower chair or bench in shower? No  Elevated toilet seat or a handicapped toilet? No   TIMED UP AND GO:  Was the test performed? No .  Telephonic visit.   Cognitive Function: Normal cognitive status assessed by direct observation by this Nurse Health Advisor. No abnormalities found.          10/17/2018    9:51 AM  6CIT Screen  What Year? 0 points  What month? 0 points  What time? 0 points  Count back from 20 0 points  Months in reverse 0 points  Repeat phrase 2 points  Total Score 2 points    Immunizations Immunization History  Administered Date(s) Administered   Fluad Quad(high Dose 65+) 04/14/2019, 04/29/2020   Influenza, High Dose Seasonal PF 05/30/2015, 04/30/2017, 05/29/2018, 04/24/2021   Influenza-Unspecified 04/25/2016   Moderna Sars-Covid-2 Vaccination 09/18/2019, 10/16/2019, 07/05/2020   Pneumococcal Conjugate-13 10/08/2013   Pneumococcal  Polysaccharide-23 08/26/2017   Pneumococcal-Unspecified 04/17/2010   Td 05/13/2009   Zoster, Live 10/11/2013    TDAP status: Due, Education has been provided regarding the importance of this vaccine. Advised may receive this vaccine at local pharmacy or Health Dept. Aware to provide a copy of the vaccination record if obtained from local pharmacy or Health Dept. Verbalized acceptance and understanding.  Flu Vaccine status: Up to date  Pneumococcal vaccine status: Up to date  Covid-19 vaccine status: Completed vaccines  Qualifies for Shingles Vaccine? Yes   Zostavax completed Yes   Shingrix Completed?: No.    Education has been provided regarding the importance of this vaccine. Patient has been advised to call insurance company to determine out of pocket expense if they have not yet received this vaccine. Advised may also receive vaccine at local pharmacy or Health Dept. Verbalized acceptance and understanding.  Screening Tests Health Maintenance  Topic Date Due   COVID-19 Vaccine (4 - Booster for Moderna series) 08/30/2020   Zoster Vaccines- Shingrix (1 of 2) 03/23/2022 (Originally 01/19/1965)   TETANUS/TDAP  12/22/2022 (Originally 05/14/2019)   INFLUENZA VACCINE  03/06/2022   HEMOGLOBIN A1C  06/23/2022   FOOT EXAM  08/23/2022   OPHTHALMOLOGY EXAM  11/02/2022   COLONOSCOPY (Pts 45-61yr Insurance  coverage will need to be confirmed)  12/17/2024   Pneumonia Vaccine 72+ Years old  Completed   Hepatitis C Screening  Completed   HPV VACCINES  Aged Out    Health Maintenance  Health Maintenance Due  Topic Date Due   COVID-19 Vaccine (4 - Booster for Moderna series) 08/30/2020    Colorectal cancer screening: Type of screening: Colonoscopy. Completed 12/18/19. Repeat every 5 years  Lung Cancer Screening: (Low Dose CT Chest recommended if Age 50-80 years, 30 pack-year currently smoking OR have quit w/in 15years.) does not qualify.   Additional Screening:  Hepatitis C Screening: does  qualify; Completed 12/07/16  Vision Screening: Recommended annual ophthalmology exams for early detection of glaucoma and other disorders of the eye. Is the patient up to date with their annual eye exam?  Yes  Who is the provider or what is the name of the office in which the patient attends annual eye exams? [facility].   Dental Screening: Recommended annual dental exams for proper oral hygiene  Community Resource Referral / Chronic Care Management: CRR required this visit?  No   CCM required this visit?  No      Plan:     I have personally reviewed and noted the following in the patient's chart:   Medical and social history Use of alcohol, tobacco or illicit drugs  Current medications and supplements including opioid prescriptions. Patient is not currently taking opioid prescriptions. Functional ability and status Nutritional status Physical activity Advanced directives List of other physicians Hospitalizations, surgeries, and ER visits in previous 12 months Vitals Screenings to include cognitive, depression, and falls Referrals and appointments  In addition, I have reviewed and discussed with patient certain preventive protocols, quality metrics, and best practice recommendations. A written personalized care plan for preventive services as well as general preventive health recommendations were provided to patient.     Edgar Marker, LPN   08/12/9148   Nurse Notes: none

## 2022-01-30 ENCOUNTER — Telehealth: Payer: Self-pay

## 2022-01-30 NOTE — Progress Notes (Signed)
Chronic Care Management Pharmacy Note  01/31/2022 Name:  Edgar [name] MRN:  [id number] DOB:  1945-09-02  Summary: Patient presents for CCM Follow-up.   Recommendations/Changes made from today's visit: Continue current medications  Plan: CPP follow-up 6 months  Subjective: Edgar [name] is an 76 y.o. year old male who is a primary patient of Edgar [name], [place].  The CCM team was consulted for assistance with disease management and care coordination needs.    Engaged with patient by telephone for follow up visit in response to provider referral for pharmacy case management and/or care coordination services.   Consent to Services:  The patient was given information about Chronic Care Management services, agreed to services, and gave verbal consent prior to initiation of services.  Please see initial visit note for detailed documentation.   Patient Care Team: Edgar Grana, PA-C as PCP - General (Family Medicine) Germaine Pomfret, [facility] as Pharmacist (Pharmacist) [name], DPM as Consulting Physician (Podiatry) Orth[facility], [facility]  Recent office visits: 01/04/22: Patient presented to Serafina Royals, FNP for follow-up. 12/21/21: Patient presented to Serafina Royals, FNP for follow-up.A1c 7.0%.  08/23/21: Patient presented to Serafina Royals, FNP for follow-up. A1c 6.3%.   Recent consult visits: None noted.   Hospital visits: None noted.    Objective:  Lab Results  Component Value Date   CREATININE 1.20 11/09/2021   BUN 16 08/23/2021   GFRNONAA >60 11/12/2020   GFRAA 80 08/01/2020   NA 139 08/23/2021   K 4.5 08/23/2021   CALCIUM 10.2 08/23/2021   CO2 32 08/23/2021    Lab Results  Component Value Date/Time   HGBA1C 7.0 (A) 12/21/2021 10:51 AM   HGBA1C 6.3 (H) 08/23/2021 01:47 PM   HGBA1C 6.8 (A) 02/16/2021 09:38 AM   HGBA1C 7.4 (H) 08/01/2020 09:46 AM   MICROALBUR 20.5 10/15/2019 10:58 AM   MICROALBUR 15.2 09/01/2018 09:49 AM    Last diabetic Eye exam:  Lab Results   Component Value Date/Time   HMDIABEYEEXA No Retinopathy 11/01/2021 12:00 AM    Last diabetic Foot exam: No results found for: "HMDIABFOOTEX"   Lab Results  Component Value Date   CHOL 127 08/23/2021   HDL 38 (L) 08/23/2021   LDLCALC 64 08/23/2021   TRIG 186 (H) 08/23/2021   CHOLHDL 3.3 08/23/2021       Latest Ref Rng & Units 08/23/2021    1:47 PM 10/28/2020    9:39 AM 08/01/2020    9:46 AM  Hepatic Function  Total Protein 6.1 - 8.1 g/dL 7.2  7.4  7.4   AST 10 - 35 U/L '16  13  14   '$ ALT 9 - 46 U/L '14  9  15   '$ Total Bilirubin 0.2 - 1.2 mg/dL 0.5  0.6  0.6     Lab Results  Component Value Date/Time   TSH 2.71 08/26/2017 12:21 PM   TSH 3.00 12/07/2016 09:57 AM       Latest Ref Rng & Units 08/23/2021    1:47 PM 11/12/2020   11:42 AM 10/28/2020    9:39 AM  CBC  WBC 3.8 - 10.8 Thousand/uL 7.1  5.9  6.2   Hemoglobin 13.2 - 17.1 g/dL 15.5  15.4  15.4   Hematocrit 38.5 - 50.0 % 47.9  45.8  47.2   Platelets 140 - 400 Thousand/uL 257  255  260     Lab Results  Component Value Date/Time   VD25OH 52 08/26/2017 12:21 PM   VD25OH 45.9 01/06/2016 09:18 AM  Clinical ASCVD: No  The ASCVD Risk score (Arnett DK, et al., 2019) failed to calculate for the following reasons:   The valid total cholesterol range is 130 to 320 mg/dL       01/09/2022    9:09 AM 01/04/2022    8:43 AM 12/21/2021   10:19 AM  Depression screen PHQ 2/9  Decreased Interest 0 0 0  Down, Depressed, Hopeless 0 0 0  PHQ - 2 Score 0 0 0    Social History   Tobacco Use  Smoking Status Former   Types: Cigarettes   Quit date: 09/01/1978   Years since quitting: 43.4  Smokeless Tobacco Never   BP Readings from Last 3 Encounters:  01/04/22 130/80  12/21/21 130/78  12/04/21 116/72   Pulse Readings from Last 3 Encounters:  01/04/22 96  12/21/21 82  12/04/21 72   Wt Readings from Last 3 Encounters:  01/04/22 277 lb 1.6 oz (125.7 kg)  12/21/21 275 lb 3.2 oz (124.8 kg)  12/04/21 280 lb (127 kg)     Assessment/Interventions: Review of patient past medical history, allergies, medications, health status, including review of consultants reports, laboratory and other test data, was performed as part of comprehensive evaluation and provision of chronic care management services.   SDOH:  (Social Determinants of Health) assessments and interventions performed: Yes      CCM Care Plan  No Known Allergies  Medications Reviewed Today     Reviewed by Clemetine Marker, LPN (Licensed Practical Nurse) on 01/09/22 at [place] [facility] Status: <None>   Medication Order Taking? Sig Documenting Provider Last Dose Status Informant  ACCU-CHEK GUIDE test strip [id number] Yes  [provider] Taking Active   acetaminophen (TYLENOL) 500 MG tablet [id number] Yes Take 500 mg by mouth every 6 (six) hours as needed. [provider] Taking Active Multiple Informants           Med Note Tomasita Crumble, AMBER N   Fri Dec 07, 2016  9:14 AM) PRN  amLODipine (NORVASC) 10 MG tablet [id number] Yes Take 1 tablet (10 mg total) by mouth daily. Bo Merino, FNP Taking Active   aspirin EC 81 MG tablet [id number] Yes Take 81 mg by mouth daily. Swallow whole. [provider] Taking Active   atorvastatin (LIPITOR) 40 MG tablet [id number] Yes TAKE 1 TABLET BY MOUTH EVERYDAY AT BEDTIME Bo Merino, FNP Taking Active   Butalbital-APAP-Caffeine 50-325-40 MG capsule [id number] Yes  [provider] Taking Active   cholecalciferol (VITAMIN D) 1000 units tablet [id number] Yes Take 1,000 Units by mouth 2 (two) times daily.  [provider] Taking Active Multiple Informants  empagliflozin (JARDIANCE) 25 MG TABS tablet [id number] Yes Take 1 tablet (25 mg total) by mouth daily. Bo Merino, FNP Taking Active   fluticasone [facility]) 50 MCG/ACT nasal spray [id number] Yes Place 2 sprays into both nostrils daily. Bo Merino, FNP Taking Active   HUMALOG KWIKPEN 100 UNIT/ML KwikPen [id number] Yes  INJECT 10 UNITS TOTAL INTO THE SKIN 3 (THREE) TIMES DAILY. Edgar Grana, PA-C Taking Active   Insulin Glargine (BASAGLAR KWIKPEN) 100 UNIT/ML [id number] Yes Inject 60 Units into the skin at bedtime. [address] [place], [place], PA-C Taking Active   losartan (COZAAR) 100 MG tablet [id number] Yes Take 1 tablet (100 mg total) by mouth daily. 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   Tinea pedis 06/25/2016   Hx of pulmonary embolus 05/18/2016   Hyperlipidemia associated with type 2 diabetes mellitus ([place]) 12/23/2015   Morbid obesity ([place]) 12/23/2015   Type 2 diabetes mellitus with diabetic nephropathy, 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[place]) 01/25/2015    Immunization History  Administered Date(s) Administered   Fluad Quad(high Dose 65+) 04/14/2019, 04/29/2020   Influenza, High Dose Seasonal PF 05/30/2015, 04/30/2017, 05/29/2018, 04/24/2021   Influenza-Unspecified 04/25/2016   Moderna Sars-Covid-2 Vaccination 09/18/2019, 10/16/2019, 07/05/2020   Pneumococcal Conjugate-13 10/08/2013   Pneumococcal Polysaccharide-23 08/26/2017   Pneumococcal-Unspecified 04/17/2010   Td 05/13/2009   Zoster, Live 10/11/2013    Conditions to be addressed/monitored:  Hypertension, Hyperlipidemia, Diabetes and Coronary Artery Disease  Care Plan : General Pharmacy (Adult)  Updates made by Germaine Pomfret, RPH since 01/31/2022 12:00 AM     Problem:  Hypertension, Hyperlipidemia, Diabetes and Coronary Artery Disease   Priority: High     Long-Range Goal: Patient-Specific Goal   Start Date: 10/26/2020  Expected End Date: 02/01/2023  This Visit's Progress: On track  Recent Progress: On track  Priority: High  Note:   Current Barriers:  Unable to independently afford treatment regimen  Pharmacist Clinical Goal(s):  Over the next 90 days, patient will verbalize ability to afford treatment regimen maintain control of diabetes as evidenced by A1c less than 8%  through collaboration with PharmD and provider.   Interventions: 1:1 collaboration with Edgar Grana, PA-C regarding development and update of comprehensive plan of care as evidenced by provider attestation and co-signature Inter-disciplinary care team collaboration (see longitudinal plan of care) Comprehensive medication review performed; medication list updated in electronic medical record  Hypertension (BP goal <140/90) -Controlled -Current treatment: Amlodipine 10 mg daily: Appropriate, Effective, Safe, Accessible Losartan 100 mg daily: Appropriate, Effective, Safe, Accessible  -Medications previously tried: NA  -Current home readings: NA -Denies hypotensive/hypertensive symptoms -Educated on Importance of home blood pressure monitoring; -Counseled to monitor BP at home weekly, document, and provide log at future appointments -Recommended to continue current medication  Hyperlipidemia: (LDL goal < 70) -Controlled -Current treatment: Atorvastatin 40 mg daily  -Medications previously tried: NA  -Educated on Importance of limiting foods high in cholesterol; -Recommended to continue current medication  Diabetes (A1c goal <8%) -Controlled -Current medications: Jardiance 25 mg daily: Appropriate, Effective, Safe, Accessible  Humalog 10 units three times daily: Appropriate, Effective, Safe, Accessible  Basaglar 60 units nightly (0.47 u/kg): Appropriate, Effective, Safe,  Accessible -Medications previously tried: NA  -Current home glucose readings  AM PM  21-Jun 201 156  22-Jun 158 209  23-Jun 143 116  24-Jun 133 125  25-Jun 142 150  26-Jun 119 128  27-Jun 150 164  28-Jun 164   Average 151 150   -Denies hypoglycemic/hyperglycemic symptoms.  -Patient spending more time outside mowing yards.  -Recommend continuing current medications.   Patient Goals/Self-Care Activities Over the next 90 days, patient will:  - check glucose four times daily: before meals and at bedtime , document, and provide at future appointments check blood pressure weekly, document, and  provide at future appointments  Follow Up Plan: Telephone follow up appointment with care management team member scheduled for:  07/18/22 at 2:00 PM     Medication Assistance:  Humalog, [place] obtained through [place] cares medication assistance program.  Enrollment ends Dec 2023 Jardiance obtained through [place] medication assistance program.  Enrollment ends Dec 2023  Patient's preferred pharmacy is:  [id number] [id number]: [phone]: [phone] Uses pill box? Yes Pt endorses 100% compliance  We discussed: Current pharmacy is preferred with insurance plan and patient is satisfied with pharmacy services Patient decided to: Continue current medication management strategy  Care Plan and Follow Up Patient Decision:  Patient agrees to Care Plan and Follow-up.  Plan: Telephone follow up appointment with care management team member scheduled for:  07/18/22 at 2:00 PM  AMalva Limes CHorse PasturePharmacist Practitioner  [phone]

## 2022-01-31 ENCOUNTER — Ambulatory Visit (INDEPENDENT_AMBULATORY_CARE_PROVIDER_SITE_OTHER): Payer: Medicare PPO

## 2022-01-31 DIAGNOSIS — E1121 Type 2 diabetes mellitus with diabetic nephropathy: Secondary | ICD-10-CM

## 2022-01-31 DIAGNOSIS — E1159 Type 2 diabetes mellitus with other circulatory complications: Secondary | ICD-10-CM

## 2022-01-31 NOTE — Patient Instructions (Signed)
Visit Information It was great speaking with you today!  Please let me know if you have any questions about our visit.   Goals Addressed             This Visit's Progress    Monitor and Manage My Blood Sugar-Diabetes Type 2   On track    Timeframe:  Long-Range Goal Priority:  High Start Date:  10/27/2020                           Expected End Date:  04/30/2023                     Follow Up within 90 days   - check blood sugar twice daily: before meals and at bedtime  - check blood sugar if I feel it is too high or too low - enter blood sugar readings and medication or insulin into daily log - take the blood sugar log to all doctor visits    Why is this important?   Checking your blood sugar at home helps to keep it from getting very high or very low.  Writing the results in a diary or log helps the doctor know how to care for you.  Your blood sugar log should have the time, date and the results.  Also, write down the amount of insulin or other medicine that you take.  Other information, like what you ate, exercise done and how you were feeling, will also be helpful.     Notes:         Patient Care Plan: General Pharmacy (Adult)     Problem Identified: Hypertension, Hyperlipidemia, Diabetes and Coronary Artery Disease   Priority: High     Long-Range Goal: Patient-Specific Goal   Start Date: 10/26/2020  Expected End Date: 02/01/2023  This Visit's Progress: On track  Recent Progress: On track  Priority: High  Note:   Current Barriers:  Unable to independently afford treatment regimen  Pharmacist Clinical Goal(s):  Over the next 90 days, patient will verbalize ability to afford treatment regimen maintain control of diabetes as evidenced by A1c less than 8%  through collaboration with PharmD and provider.   Interventions: 1:1 collaboration with Delsa Grana, PA-C regarding development and update of comprehensive plan of care as evidenced by provider attestation and  co-signature Inter-disciplinary care team collaboration (see longitudinal plan of care) Comprehensive medication review performed; medication list updated in electronic medical record  Hypertension (BP goal <140/90) -Controlled -Current treatment: Amlodipine 10 mg daily: Appropriate, Effective, Safe, Accessible Losartan 100 mg daily: Appropriate, Effective, Safe, Accessible  -Medications previously tried: NA  -Current home readings: NA -Denies hypotensive/hypertensive symptoms -Educated on Importance of home blood pressure monitoring; -Counseled to monitor BP at home weekly, document, and provide log at future appointments -Recommended to continue current medication  Hyperlipidemia: (LDL goal < 70) -Controlled -Current treatment: Atorvastatin 40 mg daily  -Medications previously tried: NA  -Educated on Importance of limiting foods high in cholesterol; -Recommended to continue current medication  Diabetes (A1c goal <8%) -Controlled -Current medications: Jardiance 25 mg daily: Appropriate, Effective, Safe, Accessible  Humalog 10 units three times daily: Appropriate, Effective, Safe, Accessible  Basaglar 60 units nightly (0.47 u/kg): Appropriate, Effective, Safe, Accessible -Medications previously tried: NA  -Current home glucose readings  AM PM  21-Jun 201 156  22-Jun 158 209  23-Jun 143 116  24-Jun 133 125  25-Jun 142 150  26-Jun 119 128  27-Jun 150 164  28-Jun 164   Average 151 150   -Denies hypoglycemic/hyperglycemic symptoms.  -Patient spending more time outside mowing yards.  -Recommend continuing current medications.   Patient Goals/Self-Care Activities Over the next 90 days, patient will:  - check glucose four times daily: before meals and at bedtime , document, and provide at future appointments check blood pressure weekly, document, and provide at future appointments  Follow Up Plan: Telephone follow up appointment with care management team member scheduled  for:  07/18/22 at 2:00 PM    Patient agreed to services and verbal consent obtained.   Patient verbalizes understanding of instructions and care plan provided today and agrees to view in [place]. Active MyChart status and patient understanding of how to access instructions and care plan via MyChart confirmed with patient.     Malva Limes, [place] Pharmacist Practitioner  [facility] [phone]

## 2022-02-02 DIAGNOSIS — Z794 Long term (current) use of insulin: Secondary | ICD-10-CM

## 2022-02-02 DIAGNOSIS — I10 Essential (primary) hypertension: Secondary | ICD-10-CM

## 2022-02-02 DIAGNOSIS — E1159 Type 2 diabetes mellitus with other circulatory complications: Secondary | ICD-10-CM | POA: Diagnosis not present

## 2022-02-02 DIAGNOSIS — E785 Hyperlipidemia, unspecified: Secondary | ICD-10-CM

## 2022-02-03 ENCOUNTER — Other Ambulatory Visit: Payer: Self-pay

## 2022-02-03 ENCOUNTER — Encounter: Payer: Self-pay | Admitting: Emergency Medicine

## 2022-02-03 ENCOUNTER — Emergency Department
Admission: EM | Admit: 2022-02-03 | Discharge: 2022-02-03 | Disposition: A | Discharge status: Home / Self Care | Payer: Medicare PPO | Attending: Emergency Medicine | Admitting: Emergency Medicine

## 2022-02-03 ENCOUNTER — Emergency Department: Payer: Medicare PPO

## 2022-02-03 DIAGNOSIS — M47816 Spondylosis without myelopathy or radiculopathy, lumbar region: Secondary | ICD-10-CM | POA: Diagnosis not present

## 2022-02-03 DIAGNOSIS — M25552 Pain in left hip: Secondary | ICD-10-CM | POA: Insufficient documentation

## 2022-02-03 DIAGNOSIS — I10 Essential (primary) hypertension: Secondary | ICD-10-CM | POA: Insufficient documentation

## 2022-02-03 DIAGNOSIS — E119 Type 2 diabetes mellitus without complications: Secondary | ICD-10-CM | POA: Diagnosis not present

## 2022-02-03 DIAGNOSIS — M79605 Pain in left leg: Secondary | ICD-10-CM | POA: Diagnosis not present

## 2022-02-03 MED ORDER — NAPROXEN 500 MG PO TABS: 500.0000 mg | ORAL_TABLET | Freq: Two times a day (BID) | ORAL | 11 refills | Status: DC

## 2022-02-03 MED ORDER — LIDOCAINE 5 % EX PTCH: 1.0000 | MEDICATED_PATCH | Freq: Two times a day (BID) | CUTANEOUS | 11 refills | Status: DC

## 2022-02-03 NOTE — Discharge Instructions (Addendum)
-  Follow-up with the orthopedic surgeon, Dr. Roland Rack, as discussed.  You may call within the next few days to schedule an appointment.  -You may take Tylenol and naproxen as needed for pain.  You may additionally utilize lidocaine patches as needed.  -Return to the emergency department anytime if you begin to experience any new or worsening symptoms.

## 2022-02-03 NOTE — ED Triage Notes (Signed)
Pt reports had surgery on his left leg and hip about 6 years ago and over the last few months that leg and hip has started giving him problems. Pt denies new injuries to his hip or leg and denies falls. Pt states pain is worse in the mornings and states it takes a bit for him to be able to walk but then the pain gets a little better but still aches all the time.

## 2022-02-03 NOTE — ED Provider Notes (Signed)
[facility] Provider Note    Event Date/Time   First MD Initiated Contact with Patient 02/03/22 1153     (approximate)   History   Chief Complaint Leg Pain and Hip Pain   HPI Edgar [name] is a 76 y.o. male, history of morbid obesity, thoracic aortic aneurysm, type 2 diabetes, pulmonary embolus, hypertension, presents to the emergency department for evaluation of left hip x3 months.  Reports having surgery on his left leg/hip about 6 years ago.  Denies any new injuries or falls, however states that he continues to have persistent aching pain in the left hip that has worsened particularly over the past week.  Denies fever/chills, calf pain, cold sensation, numb/tingling in affected extremity, headache, urinary symptoms, abdominal pain, or nausea/vomiting  History Limitations: Patient is hard of hearing.        Physical Exam  Triage Vital Signs: ED Triage Vitals  Enc Vitals Group     BP 02/03/22 1144 114/60     Pulse Rate 02/03/22 1144 76     Resp --      Temp 02/03/22 1144 98.3 F (36.8 C)     Temp Source 02/03/22 1144 Oral     SpO2 02/03/22 1144 95 %     Weight 02/03/22 1149 275 lb 9.2 oz (125 kg)     Height 02/03/22 1149 '6\' 1"'$  (1.854 m)     Head Circumference --      Peak Flow --      Pain Score 02/03/22 1149 10     Pain Loc --      Pain Edu? --      Excl. in [place]? --     Most recent vital signs: Vitals:   02/03/22 1144  BP: 114/60  Pulse: 76  Temp: 98.3 F (36.8 C)  SpO2: 95%    General: Awake, NAD.  Skin: Warm, dry. No rashes or lesions.  Eyes: PERRL. Conjunctivae normal.  CV: Good peripheral perfusion.  Resp: Normal effort.  Abd: Soft, non-tender. No distention.  Neuro: At baseline. No gross neurological deficits.   Focused Exam: No gross deformities to the hips or left lower extremity.  No significant tenderness with palpation of the left hip.  PMS intact distally.  Pain elicited with flexion of the hip joint.  Negative  straight leg test.  Normal range of motion of the knee.  Patient is able to ambulate on his own without assistance.  Physical Exam    ED Results / Procedures / Treatments  Labs (all labs ordered are listed, but only abnormal results are displayed) Labs Reviewed - No data to display   EKG N/A.   RADIOLOGY  ED Provider Interpretation: I personally viewed and interpreted this x-ray, no evidence of acute abnormalities.  DG Hip Unilat W or Wo Pelvis 2-3 Views Left  Result Date: 02/03/2022 CLINICAL DATA:  LEFT hip and leg surgery 6 years ago, worsening pain over past few months EXAM: DG HIP (WITH OR WITHOUT PELVIS) 2-3V LEFT COMPARISON:  04/14/2016 FINDINGS: IM nail with compression screw proximal LEFT femur post ORIF of healed intertrochanteric fracture. SI joint and LEFT hip joint spaces preserved. Joint space narrowing RIGHT hip joint. No acute fracture, dislocation, or bone destruction. Degenerative changes at visualized lower lumbar spine. IMPRESSION: Healed intertrochanteric fracture LEFT femur post nailing. Osseous demineralization with degenerative changes of lower lumbar spine and RIGHT hip joint. No acute abnormalities. Electronically Signed   By: Lavonia Dana M.D.   On: 02/03/2022 12:26  PROCEDURES:  Critical Care performed: None.  Procedures    MEDICATIONS ORDERED IN ED: Medications - No data to display   IMPRESSION / MDM / [place] / ED COURSE  I reviewed the triage vital signs and the nursing notes.                              Differential diagnosis includes, but is not limited to, hardware displacement, hip fracture, femur fracture, piriformis syndrome, sciatica.  ED Course Patient appears well, vitals within normal limits.  NAD.  Assessment/Plan Patient presents with left-sided hip pain for the past 3 months that has worsened over the past week.  No recent injuries or falls.  Left hip x-ray shows no evidence of acute abnormalities.  Hardware does  not appear to be displaced.  Physical exam overall unremarkable, though patient does endorse pain with flexion of the hip.  No evidence of neurovascular abnormalities.  He is still able to ambulate on his own.  Advanced imaging not warranted immediately at this time.  He is currently taking Tylenol for pain.  We will additionally provide him with naproxen and lidocaine patches.  His surgery was performed by orthopedic surgeon Dr. Roland Rack.  Advised him to follow-up with him for further evaluation if pain persist.  We will plan to discharge.  Patient's presentation is most consistent with acute complicated illness / injury requiring diagnostic workup.   Provided the patient with anticipatory guidance, return precautions, and educational material. Encouraged the patient to return to the emergency department at any time if they begin to experience any new or worsening symptoms. Patient expressed understanding and agreed with the plan.       FINAL CLINICAL IMPRESSION(S) / ED DIAGNOSES   Final diagnoses:  Pain of left hip     Rx / DC Orders   ED Discharge Orders          Ordered    naproxen (NAPROSYN) 500 MG tablet  2 times daily with meals        02/03/22 1339    lidocaine (LIDODERM) 5 %  Every 12 hours        02/03/22 1339             Note:  This document was prepared using Dragon voice recognition software and may include unintentional dictation errors.   Teodoro Spray, PA 02/03/22 1400    Duffy Bruce, MD 02/04/22 1141

## 2022-02-03 NOTE — ED Notes (Signed)
Provider saw pt at bedside. 

## 2022-02-19 ENCOUNTER — Telehealth: Payer: Self-pay | Admitting: Family Medicine

## 2022-02-19 NOTE — Telephone Encounter (Signed)
Called pt daughter to inform her per Bonnita Nasuti she was told papers were in process the day she called. I advised pt daughter to call DMV and ask if the process has been completed.

## 2022-02-19 NOTE — Telephone Encounter (Signed)
Pts daughter Raequan Vanschaick is calling to notify Kristeen Miss that the pt got a ticket. Pt states that [place] did call [place] and everything was fine. Pt daughter is stating that the police officer stated that the paper was not in the system as received pt received a ticket - pt was therefore medically suspended.  Please advise Daughter stephanie [place] Cb- [phone]

## 2022-02-25 ENCOUNTER — Ambulatory Visit
Admission: EM | Admit: 2022-02-25 | Discharge: 2022-02-25 | Disposition: A | Discharge status: Home / Self Care | Payer: Medicare PPO | Attending: Emergency Medicine | Admitting: Emergency Medicine

## 2022-02-25 ENCOUNTER — Encounter: Payer: Self-pay | Admitting: Emergency Medicine

## 2022-02-25 DIAGNOSIS — N481 Balanitis: Secondary | ICD-10-CM

## 2022-02-25 DIAGNOSIS — N471 Phimosis: Secondary | ICD-10-CM

## 2022-02-25 MED ORDER — AMOXICILLIN-POT CLAVULANATE 875-125 MG PO TABS: 1.0000 | ORAL_TABLET | Freq: Two times a day (BID) | ORAL | 0 refills | Status: DC

## 2022-02-25 MED ORDER — CLOTRIMAZOLE-BETAMETHASONE 1-0.05 % EX CREA: TOPICAL_CREAM | CUTANEOUS | 0 refills | Status: DC

## 2022-02-25 NOTE — ED Triage Notes (Signed)
Pt c/o red, blister area on penis for about a month. Pt reports he is uncircumcised. Reports has some dysuria

## 2022-02-25 NOTE — Discharge Instructions (Addendum)
Take the Augmentin twice daily with food for 7 days for treatment of your penile infection.  Apply the Lotrisone cream to your foreskin twice daily.  Retract your foreskin is much as possible and clean it well before application.  I have made referral to [facility] urology, they will call you for an appointment, this is to make sure that the infection resolves and also so that she may discuss possible circumcision with him.  If you have any worsening redness to penis, swelling of your penis, you have trouble urinating, you have pain with urination, or you develop fevers you need to go to the ER for evaluation.

## 2022-02-25 NOTE — ED Provider Notes (Signed)
Edgar [name]    CSN: [id number] Arrival date & time: 02/25/22  1012      History   Chief Complaint Chief Complaint  Patient presents with   Rash    HPI Edgar [name] is a 76 y.o. male.   HPI  76 year old male here for evaluation of penile complaint.  Patient reports that for the last month he has been experiencing redness and soreness at the head of his penis.  He is uncircumcised and states that he has not been able to retract his foreskin in a number of years.  He denies any drainage, fever, or pain with urination.  He states that his sugars have been running a normal in the low 200s each morning.  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   Closed intertrochanteric fracture of left hip, sequela 05/30/2016   Coronary artery disease 10/25/2017   CT scan March 2019   Diabetes mellitus without complication ([place])    Essential hypertension, benign 12/23/2015   History of colonoscopy with polypectomy 12/07/2016   Polypectomy x 12 per patient   Hx of tobacco use, presenting hazards to health 12/23/2015   Quit prior to 1997   Hx of tobacco use, presenting hazards to health 12/23/2015   Quit prior to 1997    Hyperlipidemia 12/23/2015   Hypertension    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   Obesity 12/23/2015   Pulmonary embolism ([place]) 05/18/2016   Hospitalization Sept 2017, following hip fracture   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   left ant lat thigh, SCCis in a SK, schedule EDC   Squamous cell carcinoma of skin 08/14/2021   L ant lat thigh, EDC 09/07/21    Patient Active Problem List   Diagnosis Date Noted   Hypertension associated with type 2 diabetes mellitus ([place]) 12/21/2021   Acquired trigger finger of right index finger 12/30/2020   Rhinitis 08/01/2020   Aortic atherosclerosis ([place]) 08/01/2020    Coronary artery disease 10/25/2017   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   History of colonoscopy with polypectomy 12/07/2016   Fatty liver 09/11/2016   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/11/2016   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   Tinea pedis 06/25/2016   Hx of pulmonary embolus 05/18/2016   Hyperlipidemia associated with type 2 diabetes mellitus ([place]) 12/23/2015   Morbid obesity ([place]) 12/23/2015   Type 2 diabetes mellitus with diabetic nephropathy, 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[place]) 01/25/2015    Past Surgical History:  Procedure Laterality Date   COLONOSCOPY WITH PROPOFOL N/A 01/15/2017   Procedure: COLONOSCOPY WITH PROPOFOL;  Surgeon: Jonathon Bellows, MD;  Location[facility] ENDOSCOPY;  Service: Endoscopy;  Laterality: N/A;   COLONOSCOPY WITH PROPOFOL N/A 12/17/2019   Procedure: COLONOSCOPY WITH PROPOFOL;  Surgeon: Jonathon Bellows, MD;  Location[facility] ENDOSCOPY;  Service: Gastroenterology;  Laterality: N/A;   EYE SURGERY     FRACTURE SURGERY     INTRAMEDULLARY (IM) NAIL INTERTROCHANTERIC Left 04/14/2016   Procedure: INTRAMEDULLARY (IM) NAIL INTERTROCHANTRIC;  Surgeon: Corky Mull, MD;  Location: [facility] ORS;  Service: Orthopedics;  Laterality: Left;   JOINT REPLACEMENT Left 04/2016   TOTAL HIP ARTHROPLASTY         Home Medications    Prior to Admission medications   Medication Sig Start Date End  Date Taking? Authorizing Provider  amoxicillin-clavulanate (AUGMENTIN) 875-125 MG tablet Take 1 tablet by mouth every 12 (twelve) hours. 02/25/22  Yes Margarette Canada, NP  clotrimazole-betamethasone (LOTRISONE) cream Apply to affected area 2 times daily prn 02/25/22  Yes Margarette Canada, NP  ACCU-CHEK GUIDE test strip  01/08/22   [provider]  acetaminophen (TYLENOL) 500 MG tablet Take 500 mg by mouth every 6 (six) hours as needed.    [provider]  amLODipine (NORVASC) 10 MG tablet Take 1 tablet (10 mg total) by  mouth daily. 08/23/21   Bo Merino, FNP  aspirin EC 81 MG tablet Take 81 mg by mouth daily. Swallow whole.    [provider]  atorvastatin (LIPITOR) 40 MG tablet TAKE 1 TABLET BY MOUTH EVERYDAY AT BEDTIME 08/23/21   Bo Merino, FNP  Butalbital-APAP-Caffeine [phone] MG capsule  11/01/21   [provider]  cholecalciferol (VITAMIN D) 1000 units tablet Take 1,000 Units by mouth 2 (two) times daily.     [provider]  empagliflozin (JARDIANCE) 25 MG TABS tablet Take 1 tablet (25 mg total) by mouth daily. 08/23/21   Bo Merino, FNP  fluticasone (FLONASE) 50 MCG/ACT nasal spray Place 2 sprays into both nostrils daily. 08/23/21   Bo Merino, FNP  HUMALOG KWIKPEN 100 UNIT/ML KwikPen INJECT 10 UNITS TOTAL INTO THE SKIN 3 (THREE) TIMES DAILY. 10/22/19   Delsa Grana, PA-C  Insulin Glargine (BASAGLAR KWIKPEN) 100 UNIT/ML Inject 60 Units into the skin at bedtime. Lilly Cares 06/23/20   Delsa Grana, PA-C  lidocaine (LIDODERM) 5 % Place 1 patch onto the skin every 12 (twelve) hours. Remove & Discard patch within 12 hours or as directed by MD 02/03/22 02/03/23  Teodoro Spray, PA  losartan (COZAAR) 100 MG tablet Take 1 tablet (100 mg total) by mouth daily. 08/23/21   Bo Merino, FNP  naproxen (NAPROSYN) 500 MG tablet Take 1 tablet (500 mg total) by mouth 2 (two) times daily with a meal. 02/03/22 02/03/23  Teodoro Spray, PA    Family History Family History  Problem Relation Age of Onset   Cancer Mother    Diabetes Mother    Heart disease Father    Alcohol abuse Brother     Social History Social History   Tobacco Use   Smoking status: Former    Types: Cigarettes    Quit date: 09/01/1978    Years since quitting: 43.5   Smokeless tobacco: Never  Vaping Use   Vaping Use: Never used  Substance Use Topics   Alcohol use: No    Alcohol/week: 0.0 standard drinks of alcohol   Drug use: No     Allergies   Patient has no known  allergies.   Review of Systems Review of Systems  Constitutional:  Negative for fever.  Genitourinary:  Positive for penile pain. Negative for penile discharge, penile swelling and scrotal swelling.  Hematological: Negative.   Psychiatric/Behavioral: Negative.       Physical Exam Triage Vital Signs ED Triage Vitals [02/25/22 1110]  Enc Vitals Group     BP 122/74     Pulse Rate 65     Resp 17     Temp 98.3 F (36.8 C)     Temp Source Oral     SpO2 94 %     Weight      Height      Head Circumference      Peak Flow      Pain  Score 0     Pain Loc      Pain Edu?      Excl. in [place]?    No data found.  Updated Vital Signs BP 122/74 (BP Location: Right Arm)   Pulse 65   Temp 98.3 F (36.8 C) (Oral)   Resp 17   SpO2 94%   Visual Acuity Right Eye Distance:   Left Eye Distance:   Bilateral Distance:    Right Eye Near:   Left Eye Near:    Bilateral Near:     Physical Exam Vitals and nursing note reviewed.  Constitutional:      Appearance: Normal appearance.  Skin:    Capillary Refill: Capillary refill takes less than 2 seconds.     Findings: Erythema present.  Neurological:     General: No focal deficit present.     Mental Status: He is alert and oriented to person, place, and time.  Psychiatric:        Mood and Affect: Mood normal.        Behavior: Behavior normal.        Thought Content: Thought content normal.        Judgment: Judgment normal.      UC Treatments / Results  Labs (all labs ordered are listed, but only abnormal results are displayed) Labs Reviewed - No data to display  EKG   Radiology No results found.  Procedures Procedures (including critical care time)  Medications Ordered in UC Medications - No data to display  Initial Impression / Assessment and Plan / UC Course  I have reviewed the triage vital signs and the nursing notes.  Pertinent labs & imaging results that were available during my care of the patient were reviewed  by me and considered in my medical decision making (see chart for details).  Patient is a nontoxic-appearing 76 year old male with a history of high blood pressure, diabetes, obesity, coronary artery disease, and thoracic aortic aneurysm presenting for evaluation of 1 month worth of redness and tenderness to the head of his penis.  The patient is uncircumcised and states that he has not been able to retract his foreskin in a number of years.  Patient has not had a fever and he denies any pain with urination.  He states that his sugars have been "good" running around 200-230, which is his normal.  On exam patient has an erythematous tip of his foreskin that I am unable to retract.  There is a milky discharge coming from the penis.  Patient's exam is consistent with balanitis with phimosis.  I will discharge the patient home on Augmentin and Lotrisone cream.  I will also refer him to urology as the patient states that he wishes to be circumcised.  I have advised the patient that if he has any worsening of redness or swelling of his penis, difficulty urinating, or develops fever that he needs to go to the ER for evaluation.   Final Clinical Impressions(s) / UC Diagnoses   Final diagnoses:  Balanitis  Phimosis of penis     Discharge Instructions      Take the Augmentin twice daily with food for 7 days for treatment of your penile infection.  Apply the Lotrisone cream to your foreskin twice daily.  Retract your foreskin is much as possible and clean it well before application.  I have made referral to [facility] urology, they will call you for an appointment, this is to make sure that the infection resolves and  also so that she may discuss possible circumcision with him.  If you have any worsening redness to penis, swelling of your penis, you have trouble urinating, you have pain with urination, or you develop fevers you need to go to the ER for evaluation.     ED Prescriptions     Medication  Sig Dispense Auth. Provider   amoxicillin-clavulanate (AUGMENTIN) 875-125 MG tablet Take 1 tablet by mouth every 12 (twelve) hours. 14 tablet Margarette Canada, NP   clotrimazole-betamethasone (LOTRISONE) cream Apply to affected area 2 times daily prn 15 g Margarette Canada, NP      PDMP not reviewed this encounter.   Margarette Canada, NP 02/25/22 1131

## 2022-03-08 ENCOUNTER — Other Ambulatory Visit: Payer: Self-pay | Admitting: Nurse Practitioner

## 2022-03-08 ENCOUNTER — Ambulatory Visit (INDEPENDENT_AMBULATORY_CARE_PROVIDER_SITE_OTHER): Payer: Medicare PPO | Admitting: Dermatology

## 2022-03-08 DIAGNOSIS — L578 Other skin changes due to chronic exposure to nonionizing radiation: Secondary | ICD-10-CM

## 2022-03-08 DIAGNOSIS — I10 Essential (primary) hypertension: Secondary | ICD-10-CM

## 2022-03-08 DIAGNOSIS — Z87438 Personal history of other diseases of male genital organs: Secondary | ICD-10-CM | POA: Diagnosis not present

## 2022-03-08 DIAGNOSIS — L814 Other melanin hyperpigmentation: Secondary | ICD-10-CM

## 2022-03-08 DIAGNOSIS — Z1283 Encounter for screening for malignant neoplasm of skin: Secondary | ICD-10-CM

## 2022-03-08 DIAGNOSIS — L821 Other seborrheic keratosis: Secondary | ICD-10-CM | POA: Diagnosis not present

## 2022-03-08 DIAGNOSIS — D1800 Hemangioma unspecified site: Secondary | ICD-10-CM

## 2022-03-08 DIAGNOSIS — Z86007 Personal history of in-situ neoplasm of skin: Secondary | ICD-10-CM | POA: Diagnosis not present

## 2022-03-08 DIAGNOSIS — D229 Melanocytic nevi, unspecified: Secondary | ICD-10-CM

## 2022-03-08 NOTE — Progress Notes (Signed)
   Follow-Up Visit   Subjective  Edgar [name] is a 76 y.o. male who presents for the following: Annual Exam (History of SCC in situ - The patient presents for Total-Body Skin Exam (TBSE) for skin cancer screening and mole check.  The patient has spots, moles and lesions to be evaluated, some may be new or changing and the patient has concerns that these could be cancer./).  The following portions of the chart were reviewed this encounter and updated as appropriate:   Tobacco  Allergies  Meds  Problems  Med Hx  Surg Hx  Fam Hx     Review of Systems:  No other skin or systemic complaints except as noted in HPI or Assessment and Plan.  Objective  Well appearing patient in no apparent distress; mood and affect are within normal limits.  A full examination was performed including scalp, head, eyes, ears, nose, lips, neck, chest, axillae, abdomen, back, buttocks, bilateral upper extremities, bilateral lower extremities, hands, feet, fingers, toes, fingernails, and toenails. All findings within normal limits unless otherwise noted below.  Left Thigh - Anterior Well healed SCC in situ site   Assessment & Plan   Lentigines - Scattered tan macules - Due to sun exposure - Benign-appearing, observe - Recommend daily broad spectrum sunscreen SPF 30+ to sun-exposed areas, reapply every 2 hours as needed. - Call for any changes  Seborrheic Keratoses - Stuck-on, waxy, tan-brown papules and/or plaques  - Benign-appearing - Discussed benign etiology and prognosis. - Observe - Call for any changes  Melanocytic Nevi - Tan-brown and/or pink-flesh-colored symmetric macules and papules - Benign appearing on exam today - Observation - Call clinic for new or changing moles - Recommend daily use of broad spectrum spf 30+ sunscreen to sun-exposed areas.   Hemangiomas - Red papules - Discussed benign nature - Observe - Call for any changes  Actinic Damage - Chronic condition, secondary  to cumulative UV/sun exposure - diffuse scaly erythematous macules with underlying dyspigmentation - Recommend daily broad spectrum sunscreen SPF 30+ to sun-exposed areas, reapply every 2 hours as needed.  - Staying in the shade or wearing long sleeves, sun glasses (UVA+UVB protection) and wide brim hats (4-inch brim around the entire circumference of the hat) are also recommended for sun protection.  - Call for new or changing lesions.  Skin cancer screening performed today.  History of Balanitis -Advised patient to keep appointment with urologist  History of squamous cell carcinoma in situ (SCCIS) of skin Left Thigh - Anterior Clear. Observe for recurrence. Call clinic for new or changing lesions.  Recommend regular skin exams, daily broad-spectrum spf 30+ sunscreen use, and photoprotection.    Return in about 1 year (around 03/09/2023) for TBSE.  I, Ashok Cordia, CMA, am acting as scribe for Sarina Ser, MD . Documentation: I have reviewed the above documentation for accuracy and completeness, and I agree with the above.  Sarina Ser, MD

## 2022-03-08 NOTE — Telephone Encounter (Signed)
Requested medication (s) are due for refill today: yes  Requested medication (s) are on the active medication list: yes  Last refill:  08/23/21  Future visit scheduled: yes  Notes to clinic: K level > 6 months    Requested Prescriptions  Pending Prescriptions Disp Refills   losartan (COZAAR) 100 MG tablet [Pharmacy Med Name: LOSARTAN POTASSIUM 100 MG TAB] 90 tablet 1    Sig: TAKE 1 TABLET BY MOUTH EVERY DAY     Cardiovascular:  Angiotensin Receptor Blockers Failed - 03/08/2022  2:20 AM      Failed - K in normal range and within 180 days    Potassium  Date Value Ref Range Status  08/23/2021 4.5 3.5 - 5.3 mmol/L Final         Passed - Cr in normal range and within 180 days    Creat  Date Value Ref Range Status  08/23/2021 1.10 0.70 - 1.28 mg/dL Final   Creatinine, Ser  Date Value Ref Range Status  11/09/2021 1.20 0.61 - 1.24 mg/dL Final   Creatinine, Urine  Date Value Ref Range Status  09/01/2018 100 20 - 320 mg/dL Final         Passed - Patient is not pregnant      Passed - Last BP in normal range    BP Readings from Last 1 Encounters:  02/25/22 122/74         Passed - Valid encounter within last 6 months    Recent Outpatient Visits           2 months ago Hypertension associated with type 2 diabetes mellitus [facility])   [facility] Bo Merino, FNP   2 months ago Hypertension associated with type 2 diabetes mellitus [facility])   [facility] Bo Merino, FNP   6 months ago Essential hypertension, benign   [facility] Serafina Royals F, FNP   1 year ago Type 2 diabetes mellitus with diabetic nephropathy, with long-term current use of insulin [facility])   [facility] Kathrine Haddock, NP   1 year ago Nodule of skin of lower extremity   [place], NP       Future Appointments             In 6 days Hollice Espy, MD [place]   In  1 month Delsa Grana, PA-C [facility], [facility]

## 2022-03-08 NOTE — Patient Instructions (Signed)
Due to recent changes in healthcare laws, you may see results of your pathology and/or laboratory studies on MyChart before the doctors have had a chance to review them. We understand that in some cases there may be results that are confusing or concerning to you. Please understand that not all results are received at the same time and often the doctors may need to interpret multiple results in order to provide you with the best plan of care or course of treatment. Therefore, we ask that you please give us 2 business days to thoroughly review all your results before contacting the office for clarification. Should we see a critical lab result, you will be contacted sooner.   If You Need Anything After Your Visit  If you have any questions or concerns for your doctor, please call our main line at [phone] and press option 4 to reach your doctor's medical assistant. If no one answers, please leave a voicemail as directed and we will return your call as soon as possible. Messages left after 4 pm will be answered the following business day.   You may also send us a message via MyChart. We typically respond to MyChart messages within 1-2 business days.  For prescription refills, please ask your pharmacy to contact our office. Our fax number is [phone].  If you have an urgent issue when the clinic is closed that cannot wait until the next business day, you can page your doctor at the number below.    Please note that while we do our best to be available for urgent issues outside of office hours, we are not available 24/7.   If you have an urgent issue and are unable to reach us, you may choose to seek medical care at your doctor's office, retail clinic, urgent care center, or emergency room.  If you have a medical emergency, please immediately call 911 or go to the emergency department.  Pager Numbers  - Dr. Kowalski: [phone]  - Dr. Moye: [phone]  - Dr. Stewart:  [phone]  In the event of inclement weather, please call our main line at [phone] for an update on the status of any delays or closures.  Dermatology Medication Tips: Please keep the boxes that topical medications come in in order to help keep track of the instructions about where and how to use these. Pharmacies typically print the medication instructions only on the boxes and not directly on the medication tubes.   If your medication is too expensive, please contact our office at [phone] option 4 or send us a message through MyChart.   We are unable to tell what your co-pay for medications will be in advance as this is different depending on your insurance coverage. However, we may be able to find a substitute medication at lower cost or fill out paperwork to get insurance to cover a needed medication.   If a prior authorization is required to get your medication covered by your insurance company, please allow us 1-2 business days to complete this process.  Drug prices often vary depending on where the prescription is filled and some pharmacies may offer cheaper prices.  The website www.goodrx.com contains coupons for medications through different pharmacies. The prices here do not account for what the cost may be with help from insurance (it may be cheaper with your insurance), but the website can give you the price if you did not use any insurance.  - You can print the associated coupon and take it with   your prescription to the pharmacy.  - You may also stop by our office during regular business hours and pick up a GoodRx coupon card.  - If you need your prescription sent electronically to a different pharmacy, notify our office through [place] MyChart or by phone at [phone] option 4.     Si Usted Necesita Algo Despus de Su Visita  Tambin puede enviarnos un mensaje a travs de MyChart. Por lo general respondemos a los mensajes de MyChart en el transcurso de 1 a 2  das hbiles.  Para renovar recetas, por favor pida a su farmacia que se ponga en contacto con nuestra oficina. Nuestro nmero de fax es el [phone].  Si tiene un asunto urgente cuando la clnica est cerrada y que no puede esperar hasta el siguiente da hbil, puede llamar/localizar a su doctor(a) al nmero que aparece a continuacin.   Por favor, tenga en cuenta que aunque hacemos todo lo posible para estar disponibles para asuntos urgentes fuera del horario de oficina, no estamos disponibles las 24 horas del da, los 7 das de la semana.   Si tiene un problema urgente y no puede comunicarse con nosotros, puede optar por buscar atencin mdica  en el consultorio de su doctor(a), en una clnica privada, en un centro de atencin urgente o en una sala de emergencias.  Si tiene una emergencia mdica, por favor llame inmediatamente al 911 o vaya a la sala de emergencias.  Nmeros de bper  - Dr. Kowalski: [phone]  - Dra. Moye: [phone]  - Dra. Stewart: [phone]  En caso de inclemencias del tiempo, por favor llame a nuestra lnea principal al [phone] para una actualizacin sobre el estado de cualquier retraso o cierre.  Consejos para la medicacin en dermatologa: Por favor, guarde las cajas en las que vienen los medicamentos de uso tpico para ayudarle a seguir las instrucciones sobre dnde y cmo usarlos. Las farmacias generalmente imprimen las instrucciones del medicamento slo en las cajas y no directamente en los tubos del medicamento.   Si su medicamento es muy caro, por favor, pngase en contacto con nuestra oficina llamando al [phone] y presione la opcin 4 o envenos un mensaje a travs de MyChart.   No podemos decirle cul ser su copago por los medicamentos por adelantado ya que esto es diferente dependiendo de la cobertura de su seguro. Sin embargo, es posible que podamos encontrar un medicamento sustituto a menor costo o llenar un formulario para que el  seguro cubra el medicamento que se considera necesario.   Si se requiere una autorizacin previa para que su compaa de seguros cubra su medicamento, por favor permtanos de 1 a 2 das hbiles para completar este proceso.  Los precios de los medicamentos varan con frecuencia dependiendo del lugar de dnde se surte la receta y alguna farmacias pueden ofrecer precios ms baratos.  El sitio web www.goodrx.com tiene cupones para medicamentos de diferentes farmacias. Los precios aqu no tienen en cuenta lo que podra costar con la ayuda del seguro (puede ser ms barato con su seguro), pero el sitio web puede darle el precio si no utiliz ningn seguro.  - Puede imprimir el cupn correspondiente y llevarlo con su receta a la farmacia.  - Tambin puede pasar por nuestra oficina durante el horario de atencin regular y recoger una tarjeta de cupones de GoodRx.  - Si necesita que su receta se enve electrnicamente a una farmacia diferente, informe a nuestra oficina a travs de MyChart de [place]   o por telfono llamando al [phone] y presione la opcin 4.  

## 2022-03-11 ENCOUNTER — Encounter: Payer: Self-pay | Admitting: Dermatology

## 2022-03-14 ENCOUNTER — Telehealth: Payer: Self-pay

## 2022-03-14 ENCOUNTER — Ambulatory Visit: Payer: Medicare PPO | Admitting: Urology

## 2022-03-14 ENCOUNTER — Encounter: Payer: Self-pay | Admitting: Urology

## 2022-03-14 ENCOUNTER — Other Ambulatory Visit: Payer: Self-pay | Admitting: Urology

## 2022-03-14 VITALS — BP 130/80 | HR 64 | Ht 73.0 in | Wt 275.0 lb

## 2022-03-14 DIAGNOSIS — N471 Phimosis: Secondary | ICD-10-CM

## 2022-03-14 DIAGNOSIS — R3129 Other microscopic hematuria: Secondary | ICD-10-CM

## 2022-03-14 DIAGNOSIS — R300 Dysuria: Secondary | ICD-10-CM | POA: Diagnosis not present

## 2022-03-14 LAB — URINALYSIS, COMPLETE
Bilirubin, UA: NEGATIVE
Ketones, UA: NEGATIVE
Leukocytes,UA: NEGATIVE
Nitrite, UA: NEGATIVE
Specific Gravity, UA: 1.025 (ref 1.005–1.030)
Urobilinogen, Ur: 1 mg/dL (ref 0.2–1.0)
pH, UA: 5.5 (ref 5.0–7.5)

## 2022-03-14 LAB — MICROSCOPIC EXAMINATION

## 2022-03-14 NOTE — Telephone Encounter (Signed)
I spoke with Mr. Edgar [name] and his daughter while in clinic. We have discussed possible surgery dates and Monday August 21st, 2023 was agreed upon by all parties. Patient given information about surgery date, what to expect pre-operatively and post operatively.  We discussed that a Pre-Admission Testing office will be calling to set up the pre-op visit that will take place prior to surgery, and that these appointments are typically done over the phone with a Pre-Admissions RN.  Informed patient that our office will communicate any additional care to be provided after surgery. Patients questions or concerns were discussed during our call. Advised to call our office should there be any additional information, questions or concerns that arise. Patient verbalized understanding.

## 2022-03-14 NOTE — Progress Notes (Signed)
03/14/22 7:59 AM   Edgar [name] 26-Jul-1946 [id number]  Referring provider:  Margarette Canada, NP [address] [place],  [place] [address] Chief Complaint  Patient presents with   phimosis     HPI: Edgar [name] is a 76 y.o.male for further evaluation of phimosis.    He was seen in urgent care on 02/25/2022 and presented with redness and soreness at the head of the penis . He was noted to be uncircumcised and not able to retract his foreskin in a number of years.On exam, he had an erythematous tip of his foreskin that was unable to be retracted, with milky discharge coming from the penis . He was prescribed Augmentin and Lotrisone cream and further referred to urology, as he stated he would like a circumcision.   He is still unable to retract his foreskin.     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   Closed intertrochanteric fracture of left hip, sequela 05/30/2016   Coronary artery disease 10/25/2017   CT scan March 2019   Diabetes mellitus without complication ([place])    Essential hypertension, benign 12/23/2015   History of colonoscopy with polypectomy 12/07/2016   Polypectomy x 12 per patient   Hx of tobacco use, presenting hazards to health 12/23/2015   Quit prior to 1997   Hx of tobacco use, presenting hazards to health 12/23/2015   Quit prior to 1997    Hyperlipidemia 12/23/2015   Hypertension    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[place]) 05/14/2017   Obesity 12/23/2015   Pulmonary embolism ([place]) 05/18/2016   Hospitalization Sept 2017, following hip fracture   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   left ant lat thigh, SCCis in a SK, schedule EDC   Squamous cell carcinoma of skin 08/14/2021   L ant lat thigh, EDC 09/07/21    Surgical History: Past Surgical History:  Procedure Laterality Date   COLONOSCOPY WITH  PROPOFOL N/A 01/15/2017   Procedure: COLONOSCOPY WITH PROPOFOL;  Surgeon: Jonathon Bellows, MD;  Location: [facility] ENDOSCOPY;  Service: Endoscopy;  Laterality: N/A;   COLONOSCOPY WITH PROPOFOL N/A 12/17/2019   Procedure: COLONOSCOPY WITH PROPOFOL;  Surgeon: Jonathon Bellows, MD;  Location: [facility] ENDOSCOPY;  Service: Gastroenterology;  Laterality: N/A;   EYE SURGERY     FRACTURE SURGERY     INTRAMEDULLARY (IM) NAIL INTERTROCHANTERIC Left 04/14/2016   Procedure: INTRAMEDULLARY (IM) NAIL INTERTROCHANTRIC;  Surgeon: Corky Mull, MD;  Location: [facility] ORS;  Service: Orthopedics;  Laterality: Left;   JOINT REPLACEMENT Left 04/2016   TOTAL HIP ARTHROPLASTY      Home Medications:  Allergies as of 03/14/2022   No Known Allergies      Medication List        Accurate as of March 14, 2022 11:59 PM. If you have any questions, ask your nurse or doctor.          Accu-Chek Guide test strip Generic drug: glucose blood   acetaminophen 500 MG tablet Commonly known as: TYLENOL Take 500 mg by mouth every 6 (six) hours as needed.   amLODipine 10 MG tablet Commonly known as: NORVASC Take 1 tablet (10 mg total) by mouth daily.   amoxicillin-clavulanate 875-125 MG tablet Commonly known as: AUGMENTIN Take 1 tablet by mouth every 12 (twelve) hours.   aspirin EC 81 MG tablet Take 81 mg  by mouth daily. Swallow whole.   atorvastatin 40 MG tablet Commonly known as: LIPITOR TAKE 1 TABLET BY MOUTH EVERYDAY AT BEDTIME   Basaglar KwikPen 100 UNIT/ML Inject 60 Units into the skin at bedtime. Lilly Cares   Butalbital-APAP-Caffeine 50-325-40 MG capsule   cholecalciferol 1000 units tablet Commonly known as: VITAMIN D Take 1,000 Units by mouth 2 (two) times daily.   clotrimazole-betamethasone cream Commonly known as: LOTRISONE Apply to affected area 2 times daily prn   empagliflozin 25 MG Tabs tablet Commonly known as: Jardiance Take 1 tablet (25 mg total) by mouth daily.   fluticasone 50 MCG/ACT nasal  spray Commonly known as: FLONASE Place 2 sprays into both nostrils daily.   HumaLOG KwikPen 100 UNIT/ML KwikPen Generic drug: insulin lispro INJECT 10 UNITS TOTAL INTO THE SKIN 3 (THREE) TIMES DAILY.   lidocaine 5 % Commonly known as: Lidoderm Place 1 patch onto the skin every 12 (twelve) hours. Remove & Discard patch within 12 hours or as directed by MD   losartan 100 MG tablet Commonly known as: COZAAR TAKE 1 TABLET BY MOUTH EVERY DAY   naproxen 500 MG tablet Commonly known as: Naprosyn Take 1 tablet (500 mg total) by mouth 2 (two) times daily with a meal.        Allergies:  No Known Allergies  Family History: Family History  Problem Relation Age of Onset   Cancer Mother    Diabetes Mother    Heart disease Father    Alcohol abuse Brother     Social History:  reports that he quit smoking about 43 years ago. His smoking use included cigarettes. He has never used smokeless tobacco. He reports that he does not drink alcohol and does not use drugs.   Physical Exam: BP 130/80   Pulse 64   Ht '6\' 1"'$  (1.854 m)   Wt 275 lb (124.7 kg)   BMI 36.28 kg/m   Constitutional:  Alert and oriented, No acute distress. HEENT: [place] AT, moist mucus membranes.  Trachea midline, no masses. Cardiovascular: No clubbing, cyanosis, or edema. Respiratory: Normal respiratory effort, no increased work of breathing. Skin: No rashes, bruises or suspicious lesions. GU: Extremely phimotic inflamed foreskin; unable to retract Neurologic: Grossly intact, no focal deficits, moving all 4 extremities. Psychiatric: Normal mood and affect.  Laboratory Data:  Lab Results  Component Value Date   CREATININE 1.20 11/09/2021   Lab Results  Component Value Date   HGBA1C 7.0 (A) 12/21/2021    Urinalysis 3+ glucose and 3-10 rbcs   Assessment & Plan:   Severe phimosis  -Unable to retract foreskin  - Will proceed with circumcision; risk were discussed including dissatisfaction with cosmesis, penile  sensitivity, bleeding, infection amongst others.  Post procedure care was also discussed.  2. Microscopic hematuria  Suspect its likely voiding through severely phimotic foreskin  - Will recheck urine    Conley Rolls as a scribe for Hollice Espy, MD.,have documented all relevant documentation on the behalf of Hollice Espy, MD,as directed by  Hollice Espy, MD while in the presence of Hollice Espy, MD.  I have reviewed the above documentation for accuracy and completeness, and I agree with the above.   Hollice Espy, MD  [facility] [address] [place],  [address] [phone]

## 2022-03-14 NOTE — Progress Notes (Unsigned)
Surgical Physician Order Form [facility] [place]  * Scheduling expectation : Next Available  *Length of Case:   *Clearance needed: no  *Anticoagulation Instructions: Hold all anticoagulants  *Aspirin Instructions: Ok to continue Aspirin  *Post-op visit Date/Instructions:  1 month follow up with UA  *Diagnosis:  Phimosis  *Procedure:  Circumcision (47159)   Additional orders: N/A  -Admit type: OUTpatient  -Anesthesia: General vs. MAC  -VTE Prophylaxis Standing Order SCD's       Other:   -Standing Lab Orders Per Anesthesia    Lab other: None  -Standing Test orders EKG/Chest x-ray per Anesthesia       Test other:   - Medications:  Ancef 2gm IV  -Other orders:  N/A

## 2022-03-14 NOTE — Progress Notes (Signed)
Chronic Care Management Pharmacy Assistant   Name: Edgar [name]  MRN: [id number] DOB: 31-Oct-1945  Reason for Encounter: Diabetes Disease State Call   Recent office visits:  None ID  Recent consult visits:  03/15/2022 [name] (Podiatry) for Foot Care- No medication changes noted, No orders placed, patient to follow-up in 4 months  03/14/2022 Hollice Espy, MD (Urology) for Phimosis- No medication changes noted, Lab orders placed, No follow-up noted  03/08/2022 Sarina Ser, MD (Dermatology) for Annual Exam- No medication changes noted, No orders placed, patient to follow-up in 1 year  Hospital visits:   Medication Reconciliation was completed by comparing discharge summary, patient's EMR and Pharmacy list, and upon discussion with patient.  Admitted to the Urgent Care on 02/25/2022 due to Balanitis. Discharge date was 02/25/2022. Discharged from [facility] Emergency Department.    New?Medications Started at [facility] Discharge:?? -Started  Amoxicillin-Pot Clavulanate 875-125 MG 1 tablet Oral Every 12 hours Clotrimazole-Betamethasone 1-0.05 % Apply to affected area 2 times daily prn  Medication Changes at Hospital Discharge: -Changed None ID  Medications Discontinued at Hospital Discharge: -Stopped None ID  Medications that remain the same after Hospital Discharge:??  -All other medications will remain the same.    Medication Reconciliation was completed by comparing discharge summary, patient's EMR and Pharmacy list, and upon discussion with patient.  Admitted to the hospital on 02/03/2022 due to Left Hip Pain. Discharge date was 02/03/2022. Discharged from [facility] Emergency Department.    New?Medications Started at [facility] Discharge:?? -Started  Lidocaine 5% 1 patch every 12 hours  Naproxen 500 mg twice daily with meals  Medication Changes at Hospital Discharge: -Changed None ID  Medications Discontinued at  Hospital Discharge: -Stopped None ID  Medications that remain the same after Hospital Discharge:??  -All other medications will remain the same.    Medications: Outpatient Encounter Medications as of 03/14/2022  Medication Sig Note   ACCU-CHEK GUIDE test strip     acetaminophen (TYLENOL) 500 MG tablet Take 500 mg by mouth every 6 (six) hours as needed. 12/07/2016: PRN   amLODipine (NORVASC) 10 MG tablet Take 1 tablet (10 mg total) by mouth daily.    amoxicillin-clavulanate (AUGMENTIN) 875-125 MG tablet Take 1 tablet by mouth every 12 (twelve) hours.    aspirin EC 81 MG tablet Take 81 mg by mouth daily. Swallow whole.    atorvastatin (LIPITOR) 40 MG tablet TAKE 1 TABLET BY MOUTH EVERYDAY AT BEDTIME    Butalbital-APAP-Caffeine 50-325-40 MG capsule     cholecalciferol (VITAMIN D) 1000 units tablet Take 1,000 Units by mouth 2 (two) times daily.     clotrimazole-betamethasone (LOTRISONE) cream Apply to affected area 2 times daily prn    empagliflozin (JARDIANCE) 25 MG TABS tablet Take 1 tablet (25 mg total) by mouth daily.    fluticasone (FLONASE) 50 MCG/ACT nasal spray Place 2 sprays into both nostrils daily.    HUMALOG KWIKPEN 100 UNIT/ML KwikPen INJECT 10 UNITS TOTAL INTO THE SKIN 3 (THREE) TIMES DAILY.    Insulin Glargine (BASAGLAR KWIKPEN) 100 UNIT/ML Inject 60 Units into the skin at bedtime. Lilly Cares    lidocaine (LIDODERM) 5 % Place 1 patch onto the skin every 12 (twelve) hours. Remove & Discard patch within 12 hours or as directed by MD    losartan (COZAAR) 100 MG tablet TAKE 1 TABLET BY MOUTH EVERY DAY    naproxen (NAPROSYN) 500 MG tablet Take 1 tablet (500 mg total) by mouth 2 (two) times  daily with a meal.    No facility-administered encounter medications on file as of 03/14/2022.   Care Gaps: Influenza Vaccine A1C last completed 08/23/2021  Star Rating Drugs: Atorvastatin 40 mg last filled on 02/28/2022 for a 90-Day supply with [facility] Jardiance 25 mg Patient receives this  medication from a patient assistance program Losartan 100 mg last filled on 03/08/2022 for a 90-Day supply with [facility]  Recent Relevant Labs: Lab Results  Component Value Date/Time   HGBA1C 7.0 (A) 12/21/2021 10:51 AM   HGBA1C 6.3 (H) 08/23/2021 01:47 PM   HGBA1C 6.8 (A) 02/16/2021 09:38 AM   HGBA1C 7.4 (H) 08/01/2020 09:46 AM   MICROALBUR 20.5 10/15/2019 10:58 AM   MICROALBUR 15.2 09/01/2018 09:49 AM    Kidney Function Lab Results  Component Value Date/Time   CREATININE 1.20 11/09/2021 01:58 PM   CREATININE 1.10 08/23/2021 01:47 PM   CREATININE 0.90 11/12/2020 11:42 AM   CREATININE 1.04 10/28/2020 09:39 AM   GFRNONAA >60 11/12/2020 11:42 AM   GFRNONAA 69 08/01/2020 09:46 AM   GFRAA 80 08/01/2020 09:46 AM   Current antihyperglycemic regimen:  Jardiance 25 mg daily Humalog 10 units three times daily Basaglar 60 units nightly (0.47 u/kg)  What recent interventions/DTPs have been made to improve glycemic control:  None ID  Have there been any recent hospitalizations or ED visits since last visit with CPP? Yes  Patient's daughter denies hypoglycemic symptoms, including Pale, Sweaty, Shaky, Hungry, Nervous/irritable, and Vision changes Patient's daughter denies hyperglycemic symptoms, including blurry vision, excessive thirst, fatigue, polyuria, and weakness  How often are you checking your blood sugar? once daily  What are your blood sugars ranging?  After meals: 152 today  During the week, how often does your blood glucose drop below 70? Never Are you checking your feet daily/regularly? Sometimes  Adherence Review: Is the patient currently on a STATIN medication? Yes Is the patient currently on ACE/ARB medication? Yes Does the patient have >5 day gap between last estimated fill dates? Yes  I spoke to the patient's daughter who reports the patient will be having surgery on Monday for a circumcision. She reports that patient is doing well. She did call patient and  added him in to the conversation and the patient advised he had been out of his Jardiance for about a month now.   Patient receives this medication via Patient assistance through [facility]. Patient stated his numbers have been between 150-200. Some of his numbers around 200 are not fasting. I was able to contact [facility] and requested a refill that patient should receive next week. Patient has been instructed to stop his Jardiance until after his surgery.   I informed patient's daughter that the Vania Rea should be delivered to the patient's home via [place] next week. I also advised her to have him start taking his blood sugars fasting after restarting the Jardiance and keep a log so I can get an accurate reading on how his blood sugars are running prior to a meal. [name] verbalized understanding.  Patient has a telephone appointment follow-up with [name], CPP on 07/18/2022 @ 1400.   Lynann Bologna, CPA/CMA Clinical Pharmacist Assistant Phone: [phone]

## 2022-03-14 NOTE — H&P (View-Only) (Signed)
03/14/22 7:59 AM   Edgar [name] [birth year] [id number]  Referring provider:  Margarette Canada, NP [address] [place],  [place] [address] Chief Complaint  Patient presents with   phimosis     HPI: Edgar [name] is a 76 y.o.male for further evaluation of phimosis.    He was seen in urgent care on 02/25/2022 and presented with redness and soreness at the head of the penis . He was noted to be uncircumcised and not able to retract his foreskin in a number of years.On exam, he had an erythematous tip of his foreskin that was unable to be retracted, with milky discharge coming from the penis . He was prescribed Augmentin and Lotrisone cream and further referred to urology, as he stated he would like a circumcision.   He is still unable to retract his foreskin.     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   Closed intertrochanteric fracture of left hip, sequela 05/30/2016   Coronary artery disease 10/25/2017   CT scan March 2019   Diabetes mellitus without complication ([place])    Essential hypertension, benign 12/23/2015   History of colonoscopy with polypectomy 12/07/2016   Polypectomy x 12 per patient   Hx of tobacco use, presenting hazards to health 12/23/2015   Quit prior to 1997   Hx of tobacco use, presenting hazards to health 12/23/2015   Quit prior to 1997    Hyperlipidemia 12/23/2015   Hypertension    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   Obesity 12/23/2015   Pulmonary embolism ([place]) 05/18/2016   Hospitalization Sept 2017, following hip fracture   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 ENDOSCOPY;  Service: Gastroenterology;  Laterality: N/A;   EYE SURGERY     FRACTURE SURGERY     INTRAMEDULLARY (IM) NAIL INTERTROCHANTERIC Left 04/14/2016   Procedure: INTRAMEDULLARY (IM) NAIL INTERTROCHANTRIC;  Surgeon: Corky Mull, MD;  Location: [facility] ORS;  Service: Orthopedics;  Laterality: Left;   JOINT REPLACEMENT Left 04/2016   TOTAL HIP ARTHROPLASTY      Home Medications:  Allergies as of 03/14/2022   No Known Allergies      Medication List        Accurate as of March 14, 2022 11:59 PM. If you have any questions, ask your nurse or doctor.          Accu-Chek Guide test strip Generic drug: glucose blood   acetaminophen 500 MG tablet Commonly known as: TYLENOL Take 500 mg by mouth every 6 (six) hours as needed.   amLODipine 10 MG tablet Commonly known as: NORVASC Take 1 tablet (10 mg total) by mouth daily.   amoxicillin-clavulanate 875-125 MG tablet Commonly known as: AUGMENTIN Take 1 tablet by mouth every 12 (twelve) hours.   aspirin EC 81 MG tablet Take 81 mg  by mouth daily. Swallow whole.   atorvastatin 40 MG tablet Commonly known as: LIPITOR TAKE 1 TABLET BY MOUTH EVERYDAY AT BEDTIME   Basaglar KwikPen 100 UNIT/ML Inject 60 Units into the skin at bedtime. Lilly Cares   Butalbital-APAP-Caffeine 50-325-40 MG capsule   cholecalciferol 1000 units tablet Commonly known as: VITAMIN D Take 1,000 Units by mouth 2 (two) times daily.   clotrimazole-betamethasone cream Commonly known as: LOTRISONE Apply to affected area 2 times daily prn   empagliflozin 25 MG Tabs tablet Commonly known as: Jardiance Take 1 tablet (25 mg total) by mouth daily.   fluticasone 50 MCG/ACT nasal  spray Commonly known as: FLONASE Place 2 sprays into both nostrils daily.   HumaLOG KwikPen 100 UNIT/ML KwikPen Generic drug: insulin lispro INJECT 10 UNITS TOTAL INTO THE SKIN 3 (THREE) TIMES DAILY.   lidocaine 5 % Commonly known as: Lidoderm Place 1 patch onto the skin every 12 (twelve) hours. Remove & Discard patch within 12 hours or as directed by MD   losartan 100 MG tablet Commonly known as: COZAAR TAKE 1 TABLET BY MOUTH EVERY DAY   naproxen 500 MG tablet Commonly known as: Naprosyn Take 1 tablet (500 mg total) by mouth 2 (two) times daily with a meal.        Allergies:  No Known Allergies  Family History: Family History  Problem Relation Age of Onset   Cancer Mother    Diabetes Mother    Heart disease Father    Alcohol abuse Brother     Social History:  reports that he quit smoking about 43 years ago. His smoking use included cigarettes. He has never used smokeless tobacco. He reports that he does not drink alcohol and does not use drugs.   Physical Exam: BP 130/80   Pulse 64   Ht '6\' 1"'$  (1.854 m)   Wt 275 lb (124.7 kg)   BMI 36.28 kg/m   Constitutional:  Alert and oriented, No acute distress. HEENT: [place] AT, moist mucus membranes.  Trachea midline, no masses. Cardiovascular: No clubbing, cyanosis, or edema. Respiratory: Normal respiratory effort, no increased work of breathing. Skin: No rashes, bruises or suspicious lesions. GU: Extremely phimotic inflamed foreskin; unable to retract Neurologic: Grossly intact, no focal deficits, moving all 4 extremities. Psychiatric: Normal mood and affect.  Laboratory Data:  Lab Results  Component Value Date   CREATININE 1.20 11/09/2021   Lab Results  Component Value Date   HGBA1C 7.0 (A) 12/21/2021    Urinalysis 3+ glucose and 3-10 rbcs   Assessment & Plan:   Severe phimosis  -Unable to retract foreskin  - Will proceed with circumcision; risk were discussed including dissatisfaction with cosmesis, penile  sensitivity, bleeding, infection amongst others.  Post procedure care was also discussed.  2. Microscopic hematuria  Suspect its likely voiding through severely phimotic foreskin  - Will recheck urine    Edgar [name] as a scribe for Edgar Espy, MD.,have documented all relevant documentation on the behalf of Edgar Espy, MD,as directed by  Edgar Espy, MD while in the presence of Edgar Espy, MD.  I have reviewed the above documentation for accuracy and completeness, and I agree with the above.   Edgar Espy, MD  [facility] [address], [place],  [address] [phone]

## 2022-03-15 ENCOUNTER — Encounter: Payer: Self-pay | Admitting: Podiatry

## 2022-03-15 ENCOUNTER — Ambulatory Visit: Payer: Medicare PPO | Admitting: Podiatry

## 2022-03-15 DIAGNOSIS — M79676 Pain in unspecified toe(s): Secondary | ICD-10-CM | POA: Diagnosis not present

## 2022-03-15 DIAGNOSIS — B351 Tinea unguium: Secondary | ICD-10-CM

## 2022-03-15 DIAGNOSIS — E1121 Type 2 diabetes mellitus with diabetic nephropathy: Secondary | ICD-10-CM

## 2022-03-15 DIAGNOSIS — Z794 Long term (current) use of insulin: Secondary | ICD-10-CM

## 2022-03-15 NOTE — Progress Notes (Signed)
[facility] Posting Form   Surgery Date/Time: Date: 03/26/2022  Surgeon: Dr. Hollice Espy, MD  Surgery Location: Day Surgery  Inpt ( No  )   Outpt (Yes)   Obs ( No  )   Diagnosis: Phimosis N47.1  -CPT: 49494  Surgery: Circumcision  Stop Anticoagulations: No  Cardiac/Medical/Pulmonary Clearance needed: no  *Orders entered into EPIC  Date: 03/15/22   *Case booked in EPIC  Date: 03/14/2022  *Notified pt of Surgery: Date: 03/14/2022  PRE-OP UA & CX: no  *Placed into Prior Authorization Work Que Date: 03/15/22   Assistant/laser/rep:No

## 2022-03-15 NOTE — Progress Notes (Signed)
This patient returns to my office for at risk foot care.  This patient requires this care by a professional since this patient will be at risk due to having  Diabetes with diabetic nephropathy.  This patient is unable to cut nails himself since the patient cannot reach his nails.These nails are painful walking and wearing shoes.  This patient presents for at risk foot care today.  General Appearance  Alert, conversant and in no acute stress.  Vascular  Dorsalis pedis and posterior tibial  pulses are palpable  bilaterally.  Capillary return is within normal limits  bilaterally. Temperature is within normal limits  bilaterally.  Neurologic  Senn-Weinstein monofilament wire test diminished  bilaterally. Muscle power within normal limits bilaterally.  Nails Thick disfigured discolored nails with subungual debris  from hallux to fifth toes bilaterally. No evidence of bacterial infection or drainage bilaterally.  Orthopedic  No limitations of motion  feet .  No crepitus or effusions noted.  No bony pathology or digital deformities noted.  HAV  B/L  Skin  normotropic skin with no porokeratosis noted bilaterally.  No signs of infections or ulcers noted.     Onychomycosis  Pain in right toes  Pain in left toes  Consent was obtained for treatment procedures.   Mechanical debridement of nails 1-5  bilaterally performed with a nail nipper.  Filed with dremel without incident.    Return office visit   4   months                   Told patient to return for periodic foot care and evaluation due to potential at risk complications.   [name] DPM

## 2022-03-17 LAB — CULTURE, URINE COMPREHENSIVE

## 2022-03-22 ENCOUNTER — Encounter
Admission: RE | Admit: 2022-03-22 | Discharge: 2022-03-22 | Disposition: A | Discharge status: Home / Self Care | Payer: Medicare PPO | Source: Ambulatory Visit | Attending: Urology | Admitting: Urology

## 2022-03-22 VITALS — Ht 72.0 in | Wt 250.0 lb

## 2022-03-22 DIAGNOSIS — Z794 Long term (current) use of insulin: Secondary | ICD-10-CM | POA: Diagnosis not present

## 2022-03-22 DIAGNOSIS — I152 Hypertension secondary to endocrine disorders: Secondary | ICD-10-CM

## 2022-03-22 DIAGNOSIS — E1121 Type 2 diabetes mellitus with diabetic nephropathy: Secondary | ICD-10-CM | POA: Insufficient documentation

## 2022-03-22 DIAGNOSIS — E1159 Type 2 diabetes mellitus with other circulatory complications: Secondary | ICD-10-CM | POA: Insufficient documentation

## 2022-03-22 DIAGNOSIS — I2510 Atherosclerotic heart disease of native coronary artery without angina pectoris: Secondary | ICD-10-CM | POA: Insufficient documentation

## 2022-03-22 DIAGNOSIS — Z01818 Encounter for other preprocedural examination: Secondary | ICD-10-CM | POA: Diagnosis not present

## 2022-03-22 LAB — CBC
HCT: 40.7 % (ref 39.0–52.0)
Hemoglobin: 13.3 g/dL (ref 13.0–17.0)
MCH: 27.9 pg (ref 26.0–34.0)
MCHC: 32.7 g/dL (ref 30.0–36.0)
MCV: 85.3 fL (ref 80.0–100.0)
Platelets: 247 10*3/uL (ref 150–400)
RBC: 4.77 MIL/uL (ref 4.22–5.81)
RDW: 14.6 % (ref 11.5–15.5)
WBC: 7.2 10*3/uL (ref 4.0–10.5)
nRBC: 0 % (ref 0.0–0.2)

## 2022-03-22 LAB — BASIC METABOLIC PANEL
Anion gap: 6 (ref 5–15)
BUN: 17 mg/dL (ref 8–23)
CO2: 24 mmol/L (ref 22–32)
Calcium: 9.7 mg/dL (ref 8.9–10.3)
Chloride: 109 mmol/L (ref 98–111)
Creatinine, Ser: 0.97 mg/dL (ref 0.61–1.24)
GFR, Estimated: >60 mL/min (ref >60)
Glucose, Bld: 119 mg/dL — ABNORMAL HIGH (ref 70–99)
Potassium: 3.7 mmol/L (ref 3.5–5.1)
Sodium: 139 mmol/L (ref 135–145)

## 2022-03-22 NOTE — Patient Instructions (Addendum)
Your procedure is scheduled on: Monday March 26, 2022. Report to Day Surgery inside [facility] 2nd floor, stop by admissions desk before getting on elevator.  To find out your arrival time please call [phone] between 1PM - 3PM on Friday March 23, 2022.  Remember: Instructions that are not followed completely may result in serious medical risk,  up to and including death, or upon the discretion of your surgeon and anesthesiologist your  surgery may need to be rescheduled.     _X__ 1. Do not eat food or drink fluids after midnight the night before your procedure.                 No chewing gum or hard candies.   __X__2.  On the morning of surgery brush your teeth with toothpaste and water, you                may rinse your mouth with mouthwash if you wish.  Do not swallow any toothpaste or mouthwash.     _X__ 3.  No Alcohol for 24 hours before or after surgery.   _X__ 4.  Do Not Smoke or use e-cigarettes For 24 Hours Prior to Your Surgery.                 Do not use any chewable tobacco products for at least 6 hours prior to                 Surgery.  _X__  5.  Do not use any recreational drugs (marijuana, cocaine, heroin, ecstasy, MDMA or other)                For at least one week prior to your surgery.  Combination of these drugs with anesthesia                May have life threatening results.  ____  6.  Bring all medications with you on the day of surgery if instructed.   __X__  7.  Notify your doctor if there is any change in your medical condition      (cold, fever, infections).     Do not wear jewelry, make-up, hairpins, clips or nail polish. Do not wear lotions, powders, or perfumes. You may wear deodorant. Do not shave 48 hours prior to surgery. Men may shave face and neck. Do not bring valuables to the hospital.    [facility] is not responsible for any belongings or valuables.  Contacts, dentures or bridgework may not be worn into  surgery. Leave your suitcase in the car. After surgery it may be brought to your room. For patients admitted to the hospital, discharge time is determined by your treatment team.   Patients discharged the day of surgery will not be allowed to drive home.   Make arrangements for someone to be with you for the first 24 hours of your Same Day Discharge.   __X__ Take these medicines the morning of surgery with A SIP OF WATER:    1. amLODipine (NORVASC) 10 MG   2.   3.   4.  5.  6.  ____ Fleet Enema (as directed)   ____ Use CHG Soap (or wipes) as directed  ____ Use Benzoyl Peroxide Gel as instructed  ____ Use inhalers on the day of surgery  __X__ Stop empagliflozin (JARDIANCE) 25 MG 3 days prior to surgery    __X__ Take 1/2 of usual insulin dose the night before surgery. Insulin Glargine (BASAGLAR  KWIKPEN) 100 UNIT/ML  NO insulin the morning          of surgery.  ____ Call your PCP, cardiologist, or Pulmonologist if taking Coumadin/Plavix/aspirin and ask when to stop before your surgery.   __X__ One Week prior to surgery- Stop Anti-inflammatories such as Ibuprofen, Aleve, Advil, Motrin, naproxen, meloxicam (MOBIC), diclofenac, etodolac, ketorolac, Toradol, Daypro, piroxicam, Goody's or BC powders. OK TO USE TYLENOL IF NEEDED   __X__ Stop supplements until after surgery. Omega-3 Fatty Acids (OMEGA-3 FISH OIL    ____ Bring C-Pap to the hospital.    If you have any questions regarding your pre-procedure instructions,  Please call Pre-admit Testing at [phone]

## 2022-03-23 ENCOUNTER — Ambulatory Visit: Payer: Self-pay

## 2022-03-23 NOTE — Chronic Care Management (AMB) (Signed)
   03/23/2022  Edgar [name] 1946/03/23 [id number]  Documentation encounter created to complete case transition. The care management team will continue to follow for care coordination as needed.   [facility] [phone]

## 2022-03-25 MED ORDER — FAMOTIDINE 20 MG PO TABS: 20.0000 mg | ORAL_TABLET | Freq: Once | ORAL | Status: AC

## 2022-03-25 MED ORDER — CEFAZOLIN SODIUM-DEXTROSE 2-4 GM/100ML-% IV SOLN
2.0000 g | INTRAVENOUS | Status: AC
  Administered 2022-03-26: 2 g via INTRAVENOUS

## 2022-03-25 MED ORDER — ORAL CARE MOUTH RINSE: 15.0000 mL | Freq: Once | OROMUCOSAL | Status: AC

## 2022-03-25 MED ORDER — SODIUM CHLORIDE 0.9 % IV SOLN: INTRAVENOUS | Status: DC

## 2022-03-25 MED ORDER — CHLORHEXIDINE GLUCONATE 0.12 % MT SOLN: 15.0000 mL | Freq: Once | OROMUCOSAL | Status: AC

## 2022-03-26 ENCOUNTER — Other Ambulatory Visit: Payer: Self-pay

## 2022-03-26 ENCOUNTER — Ambulatory Visit: Payer: Medicare PPO | Admitting: Urgent Care

## 2022-03-26 ENCOUNTER — Ambulatory Visit: Payer: Medicare PPO | Admitting: Certified Registered"

## 2022-03-26 ENCOUNTER — Ambulatory Visit
Admission: RE | Admit: 2022-03-26 | Discharge: 2022-03-26 | Disposition: A | Discharge status: Home / Self Care | Payer: Medicare PPO | Source: Ambulatory Visit | Attending: Urology | Admitting: Urology

## 2022-03-26 ENCOUNTER — Encounter
Admission: RE | Disposition: A | Discharge status: Home / Self Care | Payer: Self-pay | Source: Ambulatory Visit | Attending: Urology

## 2022-03-26 ENCOUNTER — Encounter: Payer: Self-pay | Admitting: Urology

## 2022-03-26 DIAGNOSIS — Z794 Long term (current) use of insulin: Secondary | ICD-10-CM | POA: Diagnosis not present

## 2022-03-26 DIAGNOSIS — R3129 Other microscopic hematuria: Secondary | ICD-10-CM | POA: Diagnosis not present

## 2022-03-26 DIAGNOSIS — E119 Type 2 diabetes mellitus without complications: Secondary | ICD-10-CM | POA: Insufficient documentation

## 2022-03-26 DIAGNOSIS — N471 Phimosis: Secondary | ICD-10-CM | POA: Insufficient documentation

## 2022-03-26 DIAGNOSIS — I2510 Atherosclerotic heart disease of native coronary artery without angina pectoris: Secondary | ICD-10-CM | POA: Diagnosis not present

## 2022-03-26 HISTORY — PX: CIRCUMCISION: SHX1350

## 2022-03-26 LAB — GLUCOSE, CAPILLARY
Glucose-Capillary: 166 mg/dL — ABNORMAL HIGH (ref 70–99)
Glucose-Capillary: 144 mg/dL — ABNORMAL HIGH (ref 70–99)

## 2022-03-26 SURGERY — CIRCUMCISION, ADULT
Anesthesia: General

## 2022-03-26 MED ORDER — BACITRACIN ZINC 500 UNIT/GM EX OINT
TOPICAL_OINTMENT | CUTANEOUS | Status: AC
  Filled 2022-03-26: qty 28.35

## 2022-03-26 MED ORDER — FENTANYL CITRATE (PF) 100 MCG/2ML IJ SOLN
INTRAMUSCULAR | Status: DC | PRN
  Administered 2022-03-26: 50 ug via INTRAVENOUS

## 2022-03-26 MED ORDER — BACITRACIN ZINC 500 UNIT/GM EX OINT
TOPICAL_OINTMENT | CUTANEOUS | Status: DC | PRN
  Administered 2022-03-26: 1 via TOPICAL

## 2022-03-26 MED ORDER — CHLORHEXIDINE GLUCONATE 0.12 % MT SOLN
OROMUCOSAL | Status: AC
  Administered 2022-03-26: 15 mL via OROMUCOSAL
  Filled 2022-03-26: qty 15

## 2022-03-26 MED ORDER — LIDOCAINE HCL (PF) 2 % IJ SOLN
INTRAMUSCULAR | Status: AC
  Filled 2022-03-26: qty 5

## 2022-03-26 MED ORDER — PHENYLEPHRINE HCL (PRESSORS) 10 MG/ML IV SOLN
INTRAVENOUS | Status: DC | PRN
  Administered 2022-03-26 (×3): 80 ug via INTRAVENOUS

## 2022-03-26 MED ORDER — ACETAMINOPHEN 10 MG/ML IV SOLN
INTRAVENOUS | Status: DC | PRN
  Administered 2022-03-26: 1000 mg via INTRAVENOUS

## 2022-03-26 MED ORDER — OXYCODONE HCL 5 MG/5ML PO SOLN: 5.0000 mg | Freq: Once | ORAL | Status: DC | PRN

## 2022-03-26 MED ORDER — HYDROCODONE-ACETAMINOPHEN 5-325 MG PO TABS: 1.0000 | ORAL_TABLET | Freq: Four times a day (QID) | ORAL | 0 refills | Status: DC | PRN

## 2022-03-26 MED ORDER — PROPOFOL 10 MG/ML IV BOLUS
INTRAVENOUS | Status: DC | PRN
  Administered 2022-03-26: 120 mg via INTRAVENOUS
  Administered 2022-03-26: 50 mg via INTRAVENOUS

## 2022-03-26 MED ORDER — LIDOCAINE HCL (CARDIAC) PF 100 MG/5ML IV SOSY
PREFILLED_SYRINGE | INTRAVENOUS | Status: DC | PRN
  Administered 2022-03-26: 40 mg via INTRAVENOUS

## 2022-03-26 MED ORDER — EPHEDRINE SULFATE (PRESSORS) 50 MG/ML IJ SOLN
INTRAMUSCULAR | Status: DC | PRN
  Administered 2022-03-26 (×2): 10 mg via INTRAVENOUS

## 2022-03-26 MED ORDER — OXYCODONE HCL 5 MG PO TABS: 5.0000 mg | ORAL_TABLET | Freq: Once | ORAL | Status: DC | PRN

## 2022-03-26 MED ORDER — ACETAMINOPHEN 10 MG/ML IV SOLN
INTRAVENOUS | Status: AC
  Filled 2022-03-26: qty 100

## 2022-03-26 MED ORDER — LIDOCAINE HCL (PF) 1 % IJ SOLN
INTRAMUSCULAR | Status: AC
  Filled 2022-03-26: qty 30

## 2022-03-26 MED ORDER — PROPOFOL 10 MG/ML IV BOLUS
INTRAVENOUS | Status: AC
  Filled 2022-03-26: qty 20

## 2022-03-26 MED ORDER — FENTANYL CITRATE (PF) 100 MCG/2ML IJ SOLN
INTRAMUSCULAR | Status: AC
  Filled 2022-03-26: qty 2

## 2022-03-26 MED ORDER — ONDANSETRON HCL 4 MG/2ML IJ SOLN
INTRAMUSCULAR | Status: DC | PRN
  Administered 2022-03-26: 4 mg via INTRAVENOUS

## 2022-03-26 MED ORDER — 0.9 % SODIUM CHLORIDE (POUR BTL) OPTIME
TOPICAL | Status: DC | PRN
  Administered 2022-03-26: 10 mL

## 2022-03-26 MED ORDER — CEFAZOLIN SODIUM-DEXTROSE 2-4 GM/100ML-% IV SOLN
INTRAVENOUS | Status: AC
  Filled 2022-03-26: qty 100

## 2022-03-26 MED ORDER — LIDOCAINE 1% INJECTION FOR CIRCUMCISION
INJECTION | INTRAVENOUS | Status: DC | PRN
  Administered 2022-03-26: 20 mL

## 2022-03-26 MED ORDER — ONDANSETRON HCL 4 MG/2ML IJ SOLN
INTRAMUSCULAR | Status: AC
  Filled 2022-03-26: qty 2

## 2022-03-26 MED ORDER — FAMOTIDINE 20 MG PO TABS
ORAL_TABLET | ORAL | Status: AC
  Administered 2022-03-26: 20 mg via ORAL
  Filled 2022-03-26: qty 1

## 2022-03-26 MED ORDER — FENTANYL CITRATE (PF) 100 MCG/2ML IJ SOLN: 25.0000 ug | INTRAMUSCULAR | Status: DC | PRN

## 2022-03-26 SURGICAL SUPPLY — 30 items
APL PRP STRL LF DISP 70% ISPRP (MISCELLANEOUS) ×1
BLADE CLIPPER SURG (BLADE) ×1 IMPLANT
BLADE SURG 15 STRL LF DISP TIS (BLADE) ×1 IMPLANT
BLADE SURG 15 STRL SS (BLADE) ×1
BNDG CMPR 5X1 CHSV STRCH NS (GAUZE/BANDAGES/DRESSINGS)
BNDG CMPR 75X21 PLY HI ABS (MISCELLANEOUS) ×1
BNDG COHESIVE 1X5 TAN NS LF (GAUZE/BANDAGES/DRESSINGS) IMPLANT
CHLORAPREP W/TINT 26 (MISCELLANEOUS) ×1 IMPLANT
DRAPE LAPAROTOMY 77X122 PED (DRAPES) ×1 IMPLANT
ELECT REM PT RETURN 9FT ADLT (ELECTROSURGICAL) ×1
ELECTRODE REM PT RTRN 9FT ADLT (ELECTROSURGICAL) ×1 IMPLANT
GAUZE 4X4 16PLY ~~LOC~~+RFID DBL (SPONGE) ×1 IMPLANT
GAUZE PETROLATUM 1 X8 (GAUZE/BANDAGES/DRESSINGS) ×1 IMPLANT
GAUZE STRETCH 2X75IN STRL (MISCELLANEOUS) ×1 IMPLANT
GLOVE BIO SURGEON STRL SZ 6.5 (GLOVE) ×1 IMPLANT
GOWN STRL REUS W/ TWL LRG LVL3 (GOWN DISPOSABLE) ×2 IMPLANT
GOWN STRL REUS W/TWL LRG LVL3 (GOWN DISPOSABLE) ×2
KIT TURNOVER KIT A (KITS) ×1 IMPLANT
LABEL OR SOLS (LABEL) ×1 IMPLANT
MANIFOLD NEPTUNE II (INSTRUMENTS) ×1 IMPLANT
NDL HYPO 25X1 1.5 SAFETY (NEEDLE) ×1 IMPLANT
NEEDLE HYPO 25X1 1.5 SAFETY (NEEDLE) ×1 IMPLANT
NS IRRIG 500ML POUR BTL (IV SOLUTION) ×1 IMPLANT
PACK BASIN MINOR ARMC (MISCELLANEOUS) ×1 IMPLANT
SOL PREP PVP 2OZ (MISCELLANEOUS) ×1
SOLUTION PREP PVP 2OZ (MISCELLANEOUS) ×1 IMPLANT
SUT CHROMIC 3 0 SH 27 (SUTURE) ×2 IMPLANT
SYR 10ML LL (SYRINGE) ×1 IMPLANT
TRAP FLUID SMOKE EVACUATOR (MISCELLANEOUS) ×1 IMPLANT
WATER STERILE IRR 500ML POUR (IV SOLUTION) ×1 IMPLANT

## 2022-03-26 NOTE — Anesthesia Preprocedure Evaluation (Signed)
Anesthesia Evaluation  Patient identified by MRN, date of birth, ID band Patient awake    Reviewed: Allergy & Precautions, NPO status , Patient's Chart, lab work & pertinent test results  History of Anesthesia Complications Negative for: history of anesthetic complications  Airway Mallampati: III  TM Distance: >3 FB Neck ROM: Full    Dental no notable dental hx. (+) Poor Dentition, Dental Advidsory Given   Pulmonary neg pulmonary ROS, former smoker,    breath sounds clear to auscultation       Cardiovascular Exercise Tolerance: Good hypertension, Pt. 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   Neuro/Psych neg Seizures negative psych ROS   GI/Hepatic negative GI ROS, Neg liver ROS, neg GERD  ,  Endo/Other  diabetes, Well Controlled, Type 2, Insulin DependentMorbid obesity  Renal/GU Renal disease     Musculoskeletal negative musculoskeletal ROS (+)   Abdominal   Peds  Hematology  (+) DOES NOT REFUSE BLOOD PRODUCTS,   Anesthesia Other Findings Past Medical History: No date: Diabetes mellitus without complication ([place]) 9/64/3838: Essential hypertension, benign 12/23/2015: Hx of tobacco use, presenting hazards to health     Comment: Quit prior to 1997 12/23/2015: Hyperlipidemia No date: Hypertension 12/23/2015: Obesity   Reproductive/Obstetrics                             Anesthesia Physical  Anesthesia Plan  ASA: 3  Anesthesia Plan: General   Post-op Pain Management:    Induction: Intravenous  PONV Risk Score and Plan: Ondansetron and Dexamethasone  Airway Management Planned: LMA  Additional Equipment:   Intra-op Plan:   Post-operative Plan: Extubation in OR  Informed Consent: I have reviewed the  patients History and Physical, chart, labs and discussed the procedure including the risks, benefits and alternatives for the proposed anesthesia with the patient or authorized representative who has indicated his/her understanding and acceptance.     Dental Advisory Given  Plan Discussed with: Anesthesiologist  Anesthesia Plan Comments: (Patient consented for risks of anesthesia including but not limited to:  - adverse reactions to medications - damage to eyes, teeth, lips or other oral mucosa - nerve damage due to positioning  - sore throat or hoarseness - Damage to heart, brain, nerves, lungs, other parts of body or loss of life  Patient voiced understanding.)        Anesthesia Quick Evaluation

## 2022-03-26 NOTE — Anesthesia Postprocedure Evaluation (Signed)
Anesthesia Post Note  Patient: Edgar [name]  Procedure(s) Performed: CIRCUMCISION ADULT  Patient location during evaluation: PACU Anesthesia Type: General Level of consciousness: awake and alert, oriented and patient cooperative Pain management: pain level controlled Vital Signs Assessment: post-procedure vital signs reviewed and stable Respiratory status: spontaneous breathing, nonlabored ventilation and respiratory function stable Cardiovascular status: blood pressure returned to baseline and stable Postop Assessment: adequate PO intake Anesthetic complications: no   No notable events documented.   Last Vitals:  Vitals:   03/26/22 1546 03/26/22 1556  BP: 136/82 132/79  Pulse: 65 63  Resp: (!) 9 17  Temp:  36.5 C  SpO2: 98% 98%    Last Pain:  Vitals:   03/26/22 1556  TempSrc:   PainSc: 0-No pain                 Darrin Nipper

## 2022-03-26 NOTE — Discharge Instructions (Addendum)
Circumcision Information and Post Care Instructions  Preparation: Pubic Hair There is no need to completely shave your pubic hair but it is desirable to trim it fairly short. Trim your pubic hair a few days in advance of the operation to allow time for the cut ends to soften again. Hygiene On the morning of the circumcision ensure that you take a good bath or shower and pay particular attention to your genitals. Retract your foreskin as far as you can and clean well under it. Immediately before the time of the procedure empty your bowels and bladder.   After-care: After the procedure your whole penis will be swollen and look very bruised. This is a normal effect of both the injected anaesthetic and the handling it necessarily receives during the operation. These will gradually reduce over the next week or two. Underwear If you normally wear boxers you may find that they give insufficient support immediately post procedure. You may wish to consider some form of briefs which will hold your penis in position to provide support and reduce the friction The Bandage The bandage will normally be wound tightly around the penis. Leave for 48hrs and keep clean and dry.    Promoting Healing Do not apply any antiseptic cream to your penis, nor add any antiseptic to bath water. Though they do help to kill germs, most are corrosive to new skin and actually slow down healing. In the rare cases where an infection develops, see a doctor as soon as possible. Smoking can delay healing and place you at higher risk of infection. You should quit or significantly reduce the amount of cigarettes you smoke prior to and after procedure.  Pain Killers Everyone reacts differently in respect of pain. For most people circumcision will not be truly painful, but a degree of discomfort is to be expected during the first few days. If you choose to take pain killing tablets like Tylenol then follow the instructions precisely.  Do not take more than the recommended maximum dose. Do not take Aspirin or any Aspirin based product since these thin the blood and have an anti-clotting action which can increase bleeding from a wound.  The Stitches Stitches need to remain in place long enough for the cut edges to knit together but not so long as to allow the skin around them to fully heal. In practice this usually means they should remain for between 1 and 2 weeks. Although the doctor will normally use soluble (or self-dissolving) stitches and will dissolve/ fall out on their own.    Time off School or Work There is no absolute need to take time off school or work after circumcision, but you may find it very hard to concentrate on work for the first few days and so may find it useful to take a week off. A week (or even two) off work is very desirable if you do heavy lifting or if your job keeps you seated and unable to move around freely for long periods. You should naturally avoid energetic or contact sports, sexual activity, cycling and swimming until your circumcision has fully healed.    AMBULATORY SURGERY  DISCHARGE INSTRUCTIONS   The drugs that you were given will stay in your system until tomorrow so for the next 24 hours you should not:  Drive an automobile Make any legal decisions Drink any alcoholic beverage   You may resume regular meals tomorrow.  Today it is better to start with liquids and gradually work up  to solid foods.  You may eat anything you prefer, but it is better to start with liquids, then soup and crackers, and gradually work up to solid foods.   Please notify your doctor immediately if you have any unusual bleeding, trouble breathing, redness and pain at the surgery site, drainage, fever, or pain not relieved by medication.    Additional Instructions:        Please contact your physician with any problems or Same Day Surgery at [phone], Monday through Friday 6 am to 4 pm, or  [place] [facility] number at [phone].

## 2022-03-26 NOTE — Transfer of Care (Signed)
Immediate Anesthesia Transfer of Care Note  Patient: Edgar [name]  Procedure(s) Performed: CIRCUMCISION ADULT  Patient Location: PACU  Anesthesia Type:General  Level of Consciousness: drowsy  Airway & Oxygen Therapy: Patient Spontanous Breathing and Patient connected to face mask oxygen  Post-op Assessment: Report given to RN and Post -op Vital signs reviewed and stable  Post vital signs: Reviewed and stable  Last Vitals:  Vitals Value Taken Time  BP 123/72 03/26/22 1530  Temp 36.6 C 03/26/22 1526  Pulse 67 03/26/22 1531  Resp 12 03/26/22 1531  SpO2 100 % 03/26/22 1531  Vitals shown include unvalidated device data.  Last Pain:  Vitals:   03/26/22 1153  TempSrc: Temporal  PainSc: 3       Patients Stated Pain Goal: 0 (98/42/10 3128)  Complications: No notable events documented.

## 2022-03-26 NOTE — Anesthesia Procedure Notes (Signed)
Procedure Name: LMA Insertion Date/Time: 03/26/2022 2:30 PM  Performed by: Cammie Sickle, CRNAPre-anesthesia Checklist: Patient identified, Patient being monitored, Timeout performed, Emergency Drugs available and Suction available Patient Re-evaluated:Patient Re-evaluated prior to induction Oxygen Delivery Method: Circle system utilized Preoxygenation: Pre-oxygenation with 100% oxygen Induction Type: IV induction Ventilation: Mask ventilation without difficulty LMA: LMA inserted LMA Size: 5.0 Tube type: Oral Number of attempts: 1 Placement Confirmation: positive ETCO2 and breath sounds checked- equal and bilateral Tube secured with: Tape Dental Injury: Teeth and Oropharynx as per pre-operative assessment

## 2022-03-26 NOTE — Op Note (Signed)
Date of procedure: 03/26/22  Preoperative diagnosis:   Phimosis  Postoperative diagnosis:  Phimosis  Procedure: Circumcision with penile block  Surgeon: Hollice Espy, MD  Anesthesia: General  Complications: None  Intraoperative findings: Severe phimosis.  EBL: Minimal  Specimens: None  Drains: None  Indication: Edgar [name] is a 76 y.o. patient with personal history of severe phimosis.  After reviewing the management options for treatment, he elected to proceed with the above surgical procedure(s). We have discussed the potential benefits and risks of the procedure, side effects of the proposed treatment, the likelihood of the patient achieving the goals of the procedure, and any potential problems that might occur during the procedure or recuperation. Informed consent has been obtained.  Description of procedure:  The patient was taken to the operating room and general anesthesia was induced.  The patient was placed in the supine position, prepped and draped in the usual sterile fashion, and preoperative antibiotics were administered. A preoperative time-out was performed.   A dorsal penile block was performed using 10 cc of 1% lidocaine.  An additional 10 cc was applied via ring block.  The phimosis was severe and the foreskin was unable to be retracted whatsoever.  I ended up doing a dorsal slit initially in order to expose the glans.  Additional day Betadine solution was applied.  At this point in time, the foreskin was removed in a sleevelike fashion.  This was achieved by creating an incision at the mid shaft and 1 approximately 1 cm proximal to the coronal margin.  Careful and adequate hemostasis was then achieved.  A 3-0 chromic was used to reapproximate the frenulum using a U stitch.  The remainder of the shaft skin was reapproximated in a simple interrupted fashion using the same suture material.  The patient was then cleaned and dried.  A dressing of bacitracin ointment  to the glans, Vaseline gauze, Coban and conform was applied.  The patient was then reversed from anesthesia and taken to the PACU in stable condition.  Plan: We will have him return in about a month for wound check.  Hollice Espy, M.D.

## 2022-03-26 NOTE — Interval H&P Note (Signed)
History and Physical Interval Note:  03/26/2022 2:04 PM  Edgar [name]  has presented today for surgery, with the diagnosis of Phimosis.  The various methods of treatment have been discussed with the patient and family. After consideration of risks, benefits and other options for treatment, the patient has consented to  Procedure(s): CIRCUMCISION ADULT (N/A) as a surgical intervention.  The patient's history has been reviewed, patient examined, no change in status, stable for surgery.  I have reviewed the patient's chart and labs.  Questions were answered to the patient's satisfaction.    RRR CTAB  Hollice Espy

## 2022-03-27 ENCOUNTER — Encounter: Payer: Self-pay | Admitting: Urology

## 2022-04-23 ENCOUNTER — Ambulatory Visit: Payer: Medicare PPO | Admitting: Family Medicine

## 2022-04-23 ENCOUNTER — Encounter: Payer: Self-pay | Admitting: Family Medicine

## 2022-04-23 VITALS — BP 120/74 | HR 82 | Temp 97.9°F | Resp 16 | Ht 73.0 in | Wt 271.7 lb

## 2022-04-23 DIAGNOSIS — E1159 Type 2 diabetes mellitus with other circulatory complications: Secondary | ICD-10-CM

## 2022-04-23 DIAGNOSIS — Z23 Encounter for immunization: Secondary | ICD-10-CM

## 2022-04-23 DIAGNOSIS — H6122 Impacted cerumen, left ear: Secondary | ICD-10-CM | POA: Diagnosis not present

## 2022-04-23 DIAGNOSIS — Z5181 Encounter for therapeutic drug level monitoring: Secondary | ICD-10-CM

## 2022-04-23 DIAGNOSIS — K760 Fatty (change of) liver, not elsewhere classified: Secondary | ICD-10-CM

## 2022-04-23 DIAGNOSIS — I2510 Atherosclerotic heart disease of native coronary artery without angina pectoris: Secondary | ICD-10-CM

## 2022-04-23 DIAGNOSIS — I7121 Aneurysm of the ascending aorta, without rupture: Secondary | ICD-10-CM | POA: Diagnosis not present

## 2022-04-23 DIAGNOSIS — I152 Hypertension secondary to endocrine disorders: Secondary | ICD-10-CM | POA: Diagnosis not present

## 2022-04-23 DIAGNOSIS — Z794 Long term (current) use of insulin: Secondary | ICD-10-CM | POA: Diagnosis not present

## 2022-04-23 DIAGNOSIS — E1121 Type 2 diabetes mellitus with diabetic nephropathy: Secondary | ICD-10-CM | POA: Diagnosis not present

## 2022-04-23 DIAGNOSIS — E782 Mixed hyperlipidemia: Secondary | ICD-10-CM | POA: Diagnosis not present

## 2022-04-23 DIAGNOSIS — I700 Atherosclerosis of aorta: Secondary | ICD-10-CM | POA: Diagnosis not present

## 2022-04-23 MED ORDER — SHINGRIX 50 MCG/0.5ML IM SUSR: 0.5000 mL | Freq: Once | INTRAMUSCULAR | 1 refills | Status: AC

## 2022-04-23 MED ORDER — LANCETS MISC: 11 refills | Status: DC

## 2022-04-23 NOTE — Assessment & Plan Note (Signed)
Noted on imaging, on statin ARB ASA

## 2022-04-23 NOTE — Assessment & Plan Note (Signed)
Well controlled, compliant with meds, no SE or concerning sx Recheck renal function and electrolytes Pt encouraged to continue to work on healthy diet (low salt) and lifestyle for improving HTN management - DASH diet handout offered today Continue losartan and norvasc -same dose BP at goal today BP Readings from Last 3 Encounters:  04/23/22 120/74  03/26/22 133/82  03/14/22 130/80

## 2022-04-23 NOTE — Assessment & Plan Note (Signed)
On multiple scans, monitoring LFTs Tx cholesterol and DM Avoid processed foods, some weight loss would likely help improve

## 2022-04-23 NOTE — Assessment & Plan Note (Signed)
Monitored by Vascular specialists - reviewed last imaging and OV

## 2022-04-23 NOTE — Assessment & Plan Note (Signed)
On statin, monitoring 

## 2022-04-23 NOTE — Assessment & Plan Note (Signed)
On statin, good compliance, also on supplements, last labs reviewed and at goal, will recheck annually Lab Results  Component Value Date   CHOL 127 08/23/2021   HDL 38 (L) 08/23/2021   LDLCALC 64 08/23/2021   TRIG 186 (H) 08/23/2021   CHOLHDL 3.3 08/23/2021

## 2022-04-23 NOTE — Assessment & Plan Note (Signed)
Pt on basal and mealtime insulin + jardiance He is on ARB and statin, he held ASA for a surgery - can resume UTD on foot and eye exam He needs lancets - Rx sent in - we may have to work with the pharmacy to get him the correct kind covered Basal and sliding scale insulin - he doesn't tend to check his sugars very much or adjust insulin on SS, but usually his A1C is at goal Lab Results  Component Value Date   HGBA1C 7.0 (A) 12/21/2021  labs today Then 4 month f/up

## 2022-04-23 NOTE — Assessment & Plan Note (Signed)
Multiple associated comorbidities - IDDM, HTN, HLD, CAD Offered referral to diabetes and nutrition education - RD may be helpful

## 2022-04-23 NOTE — Progress Notes (Signed)
Name: Edgar [name]   MRN: [id number]    DOB: 1946-02-14   Date:04/23/2022       Progress Note  Chief Complaint  Patient presents with   Follow-up   Hypertension   Hyperlipidemia   Diabetes    Pt needs needles refills to prick finger.     Subjective:   Edgar [name] is a 76 y.o. male, presents to clinic for routine f/up  IDDM on jardiance basal and mealtime insulin on ACEI and statin  Lab Results  Component Value Date   HGBA1C 7.0 (A) 12/21/2021  UTD on foot exam due in Jan   Hyperlipidemia: Currently treated with lipitor 40, pt reports good med compliance Last Lipids:  checked earlier this year and LDL at goal Lab Results  Component Value Date   CHOL 127 08/23/2021   HDL 38 (L) 08/23/2021   LDLCALC 64 08/23/2021   TRIG 186 (H) 08/23/2021   CHOLHDL 3.3 08/23/2021   Hypertension:  Currently managed on losartan 100 mg and norvasc 10 mg Pt reports good med compliance and denies any SE.   Blood pressure today is well controlled. BP Readings from Last 3 Encounters:  04/23/22 120/74  03/26/22 133/82  03/14/22 130/80   Pt denies CP, SOB, exertional sx, palpitation, Ha's, visual disturbances, lightheadedness, hypotension, syncope.  Recent phimosis and surgery - has seen urology, things much better now.      Current Outpatient Medications:    amLODipine (NORVASC) 10 MG tablet, Take 1 tablet (10 mg total) by mouth daily., Disp: 90 tablet, Rfl: 3   atorvastatin (LIPITOR) 40 MG tablet, TAKE 1 TABLET BY MOUTH EVERYDAY AT BEDTIME, Disp: 90 tablet, Rfl: 3   cholecalciferol (VITAMIN D) 1000 units tablet, Take 1,000 Units by mouth 2 (two) times daily. , Disp: , Rfl:    empagliflozin (JARDIANCE) 25 MG TABS tablet, Take 1 tablet (25 mg total) by mouth daily., Disp: 90 tablet, Rfl: 3   HUMALOG KWIKPEN 100 UNIT/ML KwikPen, INJECT 10 UNITS TOTAL INTO THE SKIN 3 (THREE) TIMES DAILY., Disp: 15 mL, Rfl: 6   Insulin Glargine (BASAGLAR KWIKPEN) 100 UNIT/ML, Inject 60 Units into the  skin at bedtime. Lilly Cares, Disp: 15 mL, Rfl: 0   Lancets MISC, Use as directed to monitor FSBS up to 4 x a day. Dx: E11.21 Z79.4, Disp: 100 each, Rfl: 11   losartan (COZAAR) 100 MG tablet, TAKE 1 TABLET BY MOUTH EVERY DAY, Disp: 90 tablet, Rfl: 1   naproxen (NAPROSYN) 500 MG tablet, Take 1 tablet (500 mg total) by mouth 2 (two) times daily with a meal., Disp: 60 tablet, Rfl: 11   Omega-3 Fatty Acids (OMEGA-3 FISH OIL PO), Take by mouth., Disp: , Rfl:    Zoster Vaccine Adjuvanted (SHINGRIX) injection, Inject 0.5 mLs into the muscle once for 1 dose., Disp: 0.5 mL, Rfl: 1   ACCU-CHEK GUIDE test strip, , Disp: , Rfl:    acetaminophen (TYLENOL) 500 MG tablet, Take 500 mg by mouth every 6 (six) hours as needed. (Patient not taking: Reported on 04/23/2022), Disp: , Rfl:    aspirin EC 81 MG tablet, Take 81 mg by mouth daily. Swallow whole. (Patient not taking: Reported on 03/22/2022), Disp: , Rfl:   Patient Active Problem List   Diagnosis Date Noted   Hypertension associated with type 2 diabetes mellitus ([place]) 12/21/2021   Acquired trigger finger of right index finger 12/30/2020   Rhinitis 08/01/2020   Aortic atherosclerosis ([place]) 08/01/2020   Coronary artery disease 10/25/2017  Aneurysm of thoracic aorta ([place]) 09/24/2017   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[place]) 05/14/2017   History of colonoscopy with polypectomy 12/07/2016   Fatty liver 09/11/2016   Aneurysm of aorta ([place]) 09/11/2016   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   Tinea pedis 06/25/2016   Hx of pulmonary embolus 05/18/2016   Hyperlipidemia associated with type 2 diabetes mellitus ([place]) 12/23/2015   Morbid obesity ([place]) 12/23/2015   Type 2 diabetes mellitus with diabetic nephropathy, 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[place]) 01/25/2015    Past Surgical History:  Procedure Laterality Date   CIRCUMCISION N/A 03/26/2022   Procedure: CIRCUMCISION ADULT;  Surgeon: Hollice Espy, MD;  Location: [facility] ORS;  Service: Urology;   Laterality: N/A;   COLONOSCOPY WITH PROPOFOL N/A 01/15/2017   Procedure: COLONOSCOPY WITH PROPOFOL;  Surgeon: Jonathon Bellows, MD;  Location: [facility] ENDOSCOPY;  Service: Endoscopy;  Laterality: N/A;   COLONOSCOPY WITH PROPOFOL N/A 12/17/2019   Procedure: COLONOSCOPY WITH PROPOFOL;  Surgeon: Jonathon Bellows, MD;  Location[facility] ENDOSCOPY;  Service: Gastroenterology;  Laterality: N/A;   EYE SURGERY Bilateral    cataract surgery   FRACTURE SURGERY     INTRAMEDULLARY (IM) NAIL INTERTROCHANTERIC Left 04/14/2016   Procedure: INTRAMEDULLARY (IM) NAIL INTERTROCHANTRIC;  Surgeon: Corky Mull, MD;  Location: [facility] ORS;  Service: Orthopedics;  Laterality: Left;   JOINT REPLACEMENT Left 04/2016   TOTAL HIP ARTHROPLASTY      Family History  Problem Relation Age of Onset   Cancer Mother    Diabetes Mother    Heart disease Father    Alcohol abuse Brother     Social History   Tobacco Use   Smoking status: Former    Types: Cigarettes    Quit date: 09/01/1978    Years since quitting: 43.6   Smokeless tobacco: Never  Vaping Use   Vaping Use: Never used  Substance Use Topics   Alcohol use: No    Alcohol/week: 0.0 standard drinks of alcohol   Drug use: No     No Known Allergies  Health Maintenance  Topic Date Due   Zoster Vaccines- Shingrix (1 of 2) Never done   Diabetic kidney evaluation - Urine ACR  09/02/2019   COVID-19 Vaccine (4 - Moderna risk series) 05/09/2022 (Originally 08/30/2020)   TETANUS/TDAP  12/22/2022 (Originally 05/14/2019)   HEMOGLOBIN A1C  06/23/2022   FOOT EXAM  08/23/2022   OPHTHALMOLOGY EXAM  11/02/2022   Diabetic kidney evaluation - GFR measurement  03/23/2023   COLONOSCOPY (Pts 45-68yr Insurance coverage will need to be confirmed)  12/17/2024   Pneumonia Vaccine 75 Years old  Completed   INFLUENZA VACCINE  Completed   Hepatitis C Screening  Completed   HPV VACCINES  Aged Out    Chart Review Today: I personally reviewed active problem list, medication list, allergies,  family history, social history, health maintenance, notes from last encounter, lab results, imaging with the patient/caregiver today.   Review of Systems  Constitutional: Negative.   HENT: Negative.    Eyes: Negative.   Respiratory: Negative.    Cardiovascular: Negative.   Gastrointestinal: Negative.   Endocrine: Negative.   Genitourinary: Negative.   Musculoskeletal: Negative.   Skin: Negative.   Allergic/Immunologic: Negative.   Neurological: Negative.   Hematological: Negative.   Psychiatric/Behavioral: Negative.    All other systems reviewed and are negative.    Objective:   Vitals:   04/23/22 0942  BP: 120/74  Pulse: 82  Resp: 16  Temp: 97.9 F (36.6 C)  TempSrc: Oral  SpO2: 92%  Weight: 271 lb 11.2 oz (123.2 kg)  Height: '6\' 1"'$  (1.854 m)    Body mass index is 35.85 kg/m.  Physical Exam Vitals and nursing note reviewed.  Constitutional:      General: He is not in acute distress.    Appearance: Normal appearance. He is normal weight. He is not ill-appearing, toxic-appearing or diaphoretic.  HENT:     Head: Normocephalic and atraumatic.     Right Ear: Tympanic membrane, ear canal and external ear normal. There is no impacted cerumen.     Left Ear: External ear normal. There is impacted cerumen.     Nose: Nose normal.     Mouth/Throat:     Mouth: Mucous membranes are moist.  Eyes:     General: No scleral icterus.       Right eye: No discharge.        Left eye: No discharge.     Conjunctiva/sclera: Conjunctivae normal.  Cardiovascular:     Rate and Rhythm: Normal rate and regular rhythm.     Pulses: Normal pulses.     Heart sounds: Normal heart sounds.  Pulmonary:     Breath sounds: Normal breath sounds.  Abdominal:     General: Bowel sounds are normal.  Musculoskeletal:     Right lower leg: No edema.     Left lower leg: No edema.  Skin:    General: Skin is dry.     Coloration: Skin is not jaundiced or pale.  Neurological:     Mental Status: He  is alert. Mental status is at baseline.     Gait: Gait normal.  Psychiatric:        Mood and Affect: Mood normal.        Behavior: Behavior normal.     Indication: Cerumen impaction of the ear(s)  Medical necessity statement: On physical examination, cerumen impairs clinically significant portions of the external auditory canal, and tympanic membrane. Noted obstructive, copious cerumen that cannot be removed without magnification and instrumentations requiring MD/APP skills/procedure  Consent: Discussed benefits and risks of procedure and verbal consent obtained  Procedure:  Cerumen Disimpaction  Patient was prepped for the procedure.  Utilized an otoscope to assess and take note of the ear canal, the tympanic membrane, and the presence, amount, and placement of the cerumen.  Gentle ear lavage with warm water and hydrogen peroxide performed on the left ear.   Soft plastic curette was also utilized to remove cerumen by myself with direct visualization - large amount removed and pt tolerated.   Post procedure examination: shows cerumen was completely removed. Patient tolerated procedure well.  There were no complications and following the disimpaction the tympanic membrane was visible on the left , TM intact.   Auditory canals are normal.   Pt tolerated procedure, reported improvement of symptoms after removal of cerumen.   The patient is made aware that they may experience temporary vertigo, temporary hearing loss, and temporary discomfort.  If these symptom last for more than 24 hours to follow up in clinic.     Assessment & Plan:   Problem List Items Addressed This Visit       Cardiovascular and Mediastinum   Aneurysm of thoracic aorta (HCC) (Chronic)    Monitored by Vascular specialists - reviewed last imaging and OV      Coronary artery disease    Noted on imaging, on statin ARB ASA      Aortic atherosclerosis ([place])    On statin,  monitoring      Hypertension  associated with type 2 diabetes mellitus ([place]) - Primary    Well controlled, compliant with meds, no SE or concerning sx Recheck renal function and electrolytes Pt encouraged to continue to work on healthy diet (low salt) and lifestyle for improving HTN management - DASH diet handout offered today Continue losartan and norvasc -same dose BP at goal today BP Readings from Last 3 Encounters:  04/23/22 120/74  03/26/22 133/82  03/14/22 130/80         Relevant Orders   COMPLETE METABOLIC PANEL WITH GFR     Digestive   Fatty liver    On multiple scans, monitoring LFTs Tx cholesterol and DM Avoid processed foods, some weight loss would likely help improve        Endocrine   Type 2 diabetes mellitus with diabetic nephropathy, 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HCC)    Pt on basal and mealtime insulin + jardiance He is on ARB and statin, he held ASA for a surgery - can resume UTD on foot and eye exam He needs lancets - Rx sent in - we may have to work with the pharmacy to get him the correct kind covered Basal and sliding scale insulin - he doesn't tend to check his sugars very much or adjust insulin on SS, but usually his A1C is at goal Lab Results  Component Value Date   HGBA1C 7.0 (A) 12/21/2021  labs today Then 4 month f/up       Relevant Medications   Lancets MISC   Other Relevant Orders   Microalbumin / creatinine urine ratio   Hemoglobin A1c   COMPLETE METABOLIC PANEL WITH GFR     Other   Morbid obesity ([place]) (Chronic)    Multiple associated comorbidities - IDDM, HTN, HLD, CAD Offered referral to diabetes and nutrition education - RD may be helpful       Mixed hyperlipidemia    On statin, good compliance, also on supplements, last labs reviewed and at goal, will recheck annually Lab Results  Component Value Date   CHOL 127 08/23/2021   HDL 38 (L) 08/23/2021   LDLCALC 64 08/23/2021   TRIG 186 (H) 08/23/2021   CHOLHDL 3.3 08/23/2021         Relevant  Orders   COMPLETE METABOLIC PANEL WITH GFR   Other Visit Diagnoses     Need for shingles vaccine       Relevant Medications   Zoster Vaccine Adjuvanted [facility]) injection   Need for influenza vaccination       Relevant Orders   Flu Vaccine QUAD High Dose(Fluad) (Completed)   Encounter for medication monitoring       Relevant Orders   Microalbumin / creatinine urine ratio   Hemoglobin A1c   COMPLETE METABOLIC PANEL WITH GFR   Left ear impacted cerumen       procedure done and successful with both irrigation and manual curette with direct visualization, TM intact, pt tolerated        Return for 4 month IDDM HLD HTN.   Delsa Grana, PA-C 04/23/22 10:05 AM

## 2022-04-24 LAB — COMPLETE METABOLIC PANEL WITH GFR
AG Ratio: 1 (calc) (ref 1.0–2.5)
ALT: 9 U/L (ref 9–46)
AST: 14 U/L (ref 10–35)
Albumin: 4 g/dL (ref 3.6–5.1)
Alkaline phosphatase (APISO): 80 U/L (ref 35–144)
BUN: 13 mg/dL (ref 7–25)
CO2: 28 mmol/L (ref 20–32)
Calcium: 10.3 mg/dL (ref 8.6–10.3)
Chloride: 106 mmol/L (ref 98–110)
Creat: 0.92 mg/dL (ref 0.70–1.28)
Globulin: 3.9 g/dL (calc) — ABNORMAL HIGH (ref 1.9–3.7)
Glucose, Bld: 121 mg/dL — ABNORMAL HIGH (ref 65–99)
Potassium: 4.8 mmol/L (ref 3.5–5.3)
Sodium: 140 mmol/L (ref 135–146)
Total Bilirubin: 0.6 mg/dL (ref 0.2–1.2)
Total Protein: 7.9 g/dL (ref 6.1–8.1)
eGFR: 87 mL/min/{1.73_m2} (ref >=60)

## 2022-04-24 LAB — HEMOGLOBIN A1C
Hgb A1c MFr Bld: 7.2 % of total Hgb — ABNORMAL HIGH (ref <5.7)
Mean Plasma Glucose: 160 mg/dL
eAG (mmol/L): 8.9 mmol/L

## 2022-04-24 LAB — MICROALBUMIN / CREATININE URINE RATIO
Creatinine, Urine: 85 mg/dL (ref 20–320)
Microalb Creat Ratio: 161 mcg/mg creat — ABNORMAL HIGH (ref <30)
Microalb, Ur: 13.7 mg/dL

## 2022-05-01 ENCOUNTER — Ambulatory Visit: Payer: Medicare PPO | Admitting: Urology

## 2022-05-01 ENCOUNTER — Encounter: Payer: Self-pay | Admitting: Urology

## 2022-05-01 VITALS — BP 143/74 | HR 73 | Ht 73.0 in | Wt 271.0 lb

## 2022-05-01 DIAGNOSIS — N471 Phimosis: Secondary | ICD-10-CM

## 2022-05-01 NOTE — Progress Notes (Signed)
05/01/2022 12:01 PM   Edgar [name] 06-Jun-1946 [id number]  Referring provider: Delsa Grana, PA-C [address] [place],  [place] [address]  Chief Complaint  Patient presents with   Follow-up    HPI: 76 year old male with a presence of severe phimosis who underwent circumcision 03/26/2022 presents today for postop visit.  He reports today that he is doing extremely well.  He is very pleased with the cosmesis.  He is able to retract his foreskin and has a good stream and flow.  He no longer has deviation of his urinary stream.  He is very happy with the outcome.  PMH: Past Medical History:  Diagnosis Date   Aneurysm of aorta ([place]) 09/11/2016   Noted on CT scan Sept 2017   Aneurysm of thoracic aorta ([place]) 09/24/2017   Closed intertrochanteric fracture of left hip, sequela 05/30/2016   Coronary artery disease 10/25/2017   CT scan March 2019   Diabetes mellitus without complication ([place])    Essential hypertension, benign 12/23/2015   History of colonoscopy with polypectomy 12/07/2016   Polypectomy x 12 per patient   Hx of pulmonary embolus 05/18/2016   Hospitalization Sept 2017, following hip fracture   Hx of tobacco use, presenting hazards to health 12/23/2015   Quit prior to 1997   Hx of tobacco use, presenting hazards to health 12/23/2015   Quit prior to 1997    Hyperlipidemia 12/23/2015   Hypertension    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[place]) 05/14/2017   Obesity 12/23/2015   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   Pulmonary embolism ([place]) 05/18/2016   Hospitalization Sept 2017, following hip fracture   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   left ant lat thigh, SCCis in a SK, schedule EDC   Squamous cell carcinoma of skin 08/14/2021   L ant lat thigh, EDC 09/07/21   Tinea pedis 06/25/2016    Surgical History: Past Surgical History:  Procedure Laterality Date   CIRCUMCISION N/A 03/26/2022   Procedure: CIRCUMCISION ADULT;  Surgeon:  Hollice Espy, MD;  Location: [facility] ORS;  Service: Urology;  Laterality: N/A;   COLONOSCOPY WITH PROPOFOL N/A 01/15/2017   Procedure: COLONOSCOPY WITH PROPOFOL;  Surgeon: Jonathon Bellows, MD;  Location: [facility] ENDOSCOPY;  Service: Endoscopy;  Laterality: N/A;   COLONOSCOPY WITH PROPOFOL N/A 12/17/2019   Procedure: COLONOSCOPY WITH PROPOFOL;  Surgeon: Jonathon Bellows, MD;  Location: [facility] ENDOSCOPY;  Service: Gastroenterology;  Laterality: N/A;   EYE SURGERY Bilateral    cataract surgery   FRACTURE SURGERY     INTRAMEDULLARY (IM) NAIL INTERTROCHANTERIC Left 04/14/2016   Procedure: INTRAMEDULLARY (IM) NAIL INTERTROCHANTRIC;  Surgeon: Corky Mull, MD;  Location: [facility] ORS;  Service: Orthopedics;  Laterality: Left;   JOINT REPLACEMENT Left 04/2016   TOTAL HIP ARTHROPLASTY      Home Medications:  Allergies as of 05/01/2022   No Known Allergies      Medication List        Accurate as of May 01, 2022 12:01 PM. If you have any questions, ask your nurse or doctor.          Accu-Chek Guide test strip Generic drug: glucose blood   acetaminophen 500 MG tablet Commonly known as: TYLENOL Take 500 mg by mouth every 6 (six) hours as needed.   amLODipine 10 MG tablet Commonly known as: NORVASC Take 1 tablet (10 mg total) by mouth daily.   aspirin EC 81 MG tablet Take 81 mg by mouth daily. Swallow whole.   atorvastatin 40  MG tablet Commonly known as: LIPITOR TAKE 1 TABLET BY MOUTH EVERYDAY AT BEDTIME   Basaglar KwikPen 100 UNIT/ML Inject 60 Units into the skin at bedtime. Lilly Cares   cholecalciferol 1000 units tablet Commonly known as: VITAMIN D Take 1,000 Units by mouth 2 (two) times daily.   empagliflozin 25 MG Tabs tablet Commonly known as: Jardiance Take 1 tablet (25 mg total) by mouth daily.   HumaLOG KwikPen 100 UNIT/ML KwikPen Generic drug: insulin lispro INJECT 10 UNITS TOTAL INTO THE SKIN 3 (THREE) TIMES DAILY.   Lancets Misc Use as directed to monitor FSBS up to 4  x a day. Dx: E11.21 Z79.4   losartan 100 MG tablet Commonly known as: COZAAR TAKE 1 TABLET BY MOUTH EVERY DAY   OMEGA-3 FISH OIL PO Take by mouth.        Allergies: No Known Allergies  Family History: Family History  Problem Relation Age of Onset   Cancer Mother    Diabetes Mother    Heart disease Father    Alcohol abuse Brother     Social History:  reports that he quit smoking about 43 years ago. His smoking use included cigarettes. He has never used smokeless tobacco. He reports that he does not drink alcohol and does not use drugs.   Physical Exam: BP (!) 143/74   Pulse 73   Ht '6\' 1"'$  (1.854 m)   Wt 271 lb (122.9 kg)   BMI 35.75 kg/m   Constitutional:  Alert and oriented, No acute distress. HEENT: [place] AT, moist mucus membranes.  Trachea midline, no masses. GU: Circumcised phallus with well-healing circumcision incision, some redundancy of penile shaft skin but able to easily retract Neurologic: Grossly intact, no focal deficits, moving all 4 extremities. Psychiatric: Normal mood and affect.   Assessment & Plan:    1. Phimosis Status post uncomplicated circumcision  Cosmesis is excellent, some redundancy of shaft skin to allow for avoidance of tethering with erections  Follow-up as needed   Hollice Espy, MD  [place] [address] [place],  [address] [phone]

## 2022-05-16 ENCOUNTER — Telehealth: Payer: Self-pay

## 2022-05-16 NOTE — Progress Notes (Signed)
    Chronic Care Management Pharmacy Assistant   Name: Edgar [name]  MRN: [id number] DOB: Jan 25, 1946  Reason for Encounter: 2024 Patient Assistance Application Renewal   Patient is currently on patient assistance through [facility] for his Jardiance. He is also on patient assistance through [employer] for his Humalog and Engineer, agricultural.   Renewal applications started, and will be mailed to the patient's home address for him to complete. Once completed by the patient he will be instructed to return the application along with her proof of income to [facility].  Application e-mailed to PTM so she can mail the application to the patient's home address. Patient can contact me directly @ [phone] if she has any questions.  Medications: Outpatient Encounter Medications as of 05/16/2022  Medication Sig Note   ACCU-CHEK GUIDE test strip  (Patient not taking: Reported on 04/23/2022)    acetaminophen (TYLENOL) 500 MG tablet Take 500 mg by mouth every 6 (six) hours as needed. (Patient not taking: Reported on 04/23/2022) 12/07/2016: PRN   amLODipine (NORVASC) 10 MG tablet Take 1 tablet (10 mg total) by mouth daily.    aspirin EC 81 MG tablet Take 81 mg by mouth daily. Swallow whole. (Patient not taking: Reported on 03/22/2022)    atorvastatin (LIPITOR) 40 MG tablet TAKE 1 TABLET BY MOUTH EVERYDAY AT BEDTIME    cholecalciferol (VITAMIN D) 1000 units tablet Take 1,000 Units by mouth 2 (two) times daily.     empagliflozin (JARDIANCE) 25 MG TABS tablet Take 1 tablet (25 mg total) by mouth daily.    HUMALOG KWIKPEN 100 UNIT/ML KwikPen INJECT 10 UNITS TOTAL INTO THE SKIN 3 (THREE) TIMES DAILY.    Insulin Glargine (BASAGLAR KWIKPEN) 100 UNIT/ML Inject 60 Units into the skin at bedtime. [employer] Use as directed to monitor FSBS up to 4 x a day. Dx: E11.21 Z79.4    losartan (COZAAR) 100 MG tablet TAKE 1 TABLET BY MOUTH EVERY DAY    Omega-3 Fatty Acids (OMEGA-3 FISH  OIL PO) Take by mouth.    No facility-administered encounter medications on file as of 05/16/2022.    Lynann Bologna, CPA/CMA Clinical Pharmacist Assistant Phone: [phone]

## 2022-06-04 ENCOUNTER — Encounter (INDEPENDENT_AMBULATORY_CARE_PROVIDER_SITE_OTHER): Payer: Self-pay

## 2022-07-13 ENCOUNTER — Other Ambulatory Visit (INDEPENDENT_AMBULATORY_CARE_PROVIDER_SITE_OTHER): Payer: Medicare PPO | Admitting: Family Medicine

## 2022-07-13 DIAGNOSIS — Z794 Long term (current) use of insulin: Secondary | ICD-10-CM

## 2022-07-13 DIAGNOSIS — E1121 Type 2 diabetes mellitus with diabetic nephropathy: Secondary | ICD-10-CM

## 2022-07-16 ENCOUNTER — Other Ambulatory Visit: Payer: Self-pay | Admitting: Family Medicine

## 2022-07-16 DIAGNOSIS — E1121 Type 2 diabetes mellitus with diabetic nephropathy: Secondary | ICD-10-CM

## 2022-07-16 MED ORDER — BASAGLAR KWIKPEN 100 UNIT/ML ~~LOC~~ SOPN: 60.0000 [IU] | PEN_INJECTOR | Freq: Every day | SUBCUTANEOUS | 0 refills | Status: DC

## 2022-07-18 ENCOUNTER — Telehealth: Payer: Medicare PPO

## 2022-07-19 ENCOUNTER — Ambulatory Visit: Payer: Medicare PPO | Admitting: Podiatry

## 2022-07-19 ENCOUNTER — Encounter: Payer: Self-pay | Admitting: Podiatry

## 2022-07-19 DIAGNOSIS — B351 Tinea unguium: Secondary | ICD-10-CM | POA: Diagnosis not present

## 2022-07-19 DIAGNOSIS — E1121 Type 2 diabetes mellitus with diabetic nephropathy: Secondary | ICD-10-CM

## 2022-07-19 DIAGNOSIS — M79676 Pain in unspecified toe(s): Secondary | ICD-10-CM

## 2022-07-19 DIAGNOSIS — Z794 Long term (current) use of insulin: Secondary | ICD-10-CM

## 2022-07-19 NOTE — Progress Notes (Signed)
This patient returns to my office for at risk foot care.  This patient requires this care by a professional since this patient will be at risk due to having  Diabetes with diabetic nephropathy.  This patient is unable to cut nails himself since the patient cannot reach his nails.These nails are painful walking and wearing shoes.  This patient presents for at risk foot care today.  General Appearance  Alert, conversant and in no acute stress.  Vascular  Dorsalis pedis and posterior tibial  pulses are palpable  bilaterally.  Capillary return is within normal limits  bilaterally. Temperature is within normal limits  bilaterally.  Neurologic  Senn-Weinstein monofilament wire test diminished  bilaterally. Muscle power within normal limits bilaterally.  Nails Thick disfigured discolored nails with subungual debris  from hallux to fifth toes bilaterally. No evidence of bacterial infection or drainage bilaterally.  Orthopedic  No limitations of motion  feet .  No crepitus or effusions noted.  No bony pathology or digital deformities noted.  HAV  B/L  Skin  normotropic skin with no porokeratosis noted bilaterally.  No signs of infections or ulcers noted.     Onychomycosis  Pain in right toes  Pain in left toes  Consent was obtained for treatment procedures.   Mechanical debridement of nails 1-5  bilaterally performed with a nail nipper.  Filed with dremel without incident.    Return office visit   3   months                   Told patient to return for periodic foot care and evaluation due to potential at risk complications.   [name] DPM

## 2022-07-22 ENCOUNTER — Other Ambulatory Visit: Payer: Self-pay | Admitting: Family Medicine

## 2022-08-01 ENCOUNTER — Telehealth: Payer: Self-pay

## 2022-08-01 DIAGNOSIS — Z794 Long term (current) use of insulin: Secondary | ICD-10-CM

## 2022-08-01 MED ORDER — DAPAGLIFLOZIN PROPANEDIOL 10 MG PO TABS: 10.0000 mg | ORAL_TABLET | Freq: Every day | ORAL | 0 refills | Status: DC

## 2022-08-01 MED ORDER — INSULIN LISPRO (1 UNIT DIAL) 100 UNIT/ML (KWIKPEN): 10.0000 [IU] | PEN_INJECTOR | Freq: Three times a day (TID) | SUBCUTANEOUS | 0 refills | Status: DC

## 2022-08-01 MED ORDER — BASAGLAR KWIKPEN 100 UNIT/ML ~~LOC~~ SOPN: 60.0000 [IU] | PEN_INJECTOR | Freq: Every day | SUBCUTANEOUS | Status: DC

## 2022-08-01 MED ORDER — DAPAGLIFLOZIN PROPANEDIOL 10 MG PO TABS: 10.0000 mg | ORAL_TABLET | Freq: Every day | ORAL | 3 refills | Status: DC

## 2022-08-01 NOTE — Progress Notes (Signed)
Chronic Care Management Pharmacy Assistant   Name: Edgar [name]  MRN: [id number] DOB: 11/21/1945  Reason for Encounter: Patient Assistance Renewal Application Jardiance   I received a task from Edgar [name], CPP stating that the patient was denied for Jardiance due to his income limit. When I spoke with the representative with [employer] she reports that his 1040 was reporting [phone].   I informed them that this 46 was last years taxes with is spouse, and unfortunately she passed away last year so his income would only consist of his social security.   The representative placed me on a hold in order to update the patient's information.   After patient's information was updated she stated that the patient would need to apply for Low Income subsidy via social security and receive a denial letter prior to the approving him.  I informed the CPP of the update, and I will inform the patient's daughter of what needs to happen. I did ask the CPP what is the alternative if the patient can't afford Jardiance as it takes so long for social security to approve LIS or deny LIS.  Per CPP we can send a prescription over for the patient's Jardiance but he will have to pay for this medication out of pocket. If the price is too expensive we can start him on Farxiga which is a similar medication and try for patient assistance via [employer].  I contacted the patient's daughter and left her a message to give me a call back regarding the situation so I can find out how they would like to proceed.  I spoke with the patient's daughter Edgar [name] who confirmed they would like to switch to [place] if it would be easier to get patient assistance for. CPP has been notified and will send over a 30-Day supply to [place] so the patient can see how he adjust to the medication. The 30-Day supply does come with a coupon so he can receive the 30-Day supply at no cost.  Application for Wilder Glade was started online via  [employer]. Patient was approved for the patient assistance program and enrolled into their program until 08/06/2023.   CPP and patient's daughter has been notified.   [facility] provided with the coupon for the patient's Farxiga BIN [id number] PCN [id number] GRP [id number] ID # [id number]. Everything went through on [facility] end so they will get this medication ready for the patient.  Medications: Outpatient Encounter Medications as of 08/01/2022  Medication Sig Note   ACCU-CHEK GUIDE test strip     acetaminophen (TYLENOL) 500 MG tablet Take 500 mg by mouth every 6 (six) hours as needed. 12/07/2016: PRN   amLODipine (NORVASC) 10 MG tablet Take 1 tablet (10 mg total) by mouth daily.    aspirin EC 81 MG tablet Take 81 mg by mouth daily. Swallow whole.    atorvastatin (LIPITOR) 40 MG tablet TAKE 1 TABLET BY MOUTH EVERYDAY AT BEDTIME    cholecalciferol (VITAMIN D) 1000 units tablet Take 1,000 Units by mouth 2 (two) times daily.     empagliflozin (JARDIANCE) 25 MG TABS tablet Take 1 tablet (25 mg total) by mouth daily.    HUMALOG KWIKPEN 100 UNIT/ML KwikPen DIAL 10 UNITS AND INJECT UNDER THE SKIN THREE TIMES DAILY. MAXIMUM DOSE PER DAY: 30 UNITS    insulin degludec (TRESIBA FLEXTOUCH) 100 UNIT/ML FlexTouch Pen Inject 60 Units into the skin daily.    Lancets MISC Use as directed to monitor FSBS up to  4 x a day. Dx: E11.21 Z79.4    losartan (COZAAR) 100 MG tablet TAKE 1 TABLET BY MOUTH EVERY DAY    Omega-3 Fatty Acids (OMEGA-3 FISH OIL PO) Take by mouth.    No facility-administered encounter medications on file as of 08/01/2022.    Edgar [name], CPA/CMA Clinical Pharmacist Assistant Phone: [phone]

## 2022-08-01 NOTE — Addendum Note (Signed)
Addended by: Daron Offer A on: 08/01/2022 02:45 PM   Modules accepted: Orders

## 2022-08-14 ENCOUNTER — Other Ambulatory Visit: Payer: Self-pay | Admitting: Family Medicine

## 2022-08-14 DIAGNOSIS — Z794 Long term (current) use of insulin: Secondary | ICD-10-CM

## 2022-08-14 MED ORDER — INSULIN LISPRO (1 UNIT DIAL) 100 UNIT/ML (KWIKPEN): 10.0000 [IU] | PEN_INJECTOR | Freq: Three times a day (TID) | SUBCUTANEOUS | 1 refills | Status: DC

## 2022-08-14 NOTE — Telephone Encounter (Signed)
Requested Prescriptions  Pending Prescriptions Disp Refills   insulin lispro (HUMALOG KWIKPEN) 100 UNIT/ML KwikPen 15 mL 1    Sig: Inject 10 Units into the skin 3 (three) times daily. Patient receives via [employer] Patient Assistance through Dec 2024.     Endocrinology:  Diabetes - Insulins Passed - 08/14/2022 10:20 AM      Passed - HBA1C is between 0 and 7.9 and within 180 days    Hgb A1c MFr Bld  Date Value Ref Range Status  04/23/2022 7.2 (H) <5.7 % of total Hgb Final    Comment:    For someone without known diabetes, a hemoglobin A1c value of 6.5% or greater indicates that they may have  diabetes and this should be confirmed with a follow-up  test. . For someone with known diabetes, a value <7% indicates  that their diabetes is well controlled and a value  greater than or equal to 7% indicates suboptimal  control. A1c targets should be individualized based on  duration of diabetes, age, comorbid conditions, and  other considerations. . Currently, no consensus exists regarding use of hemoglobin A1c for diagnosis of diabetes for children. Renella Cunas - Valid encounter within last 6 months    Recent Outpatient Visits           3 months ago Hypertension associated with type 2 diabetes mellitus [facility])   [facility] Delsa Grana, PA-C   7 months ago Hypertension associated with type 2 diabetes mellitus [facility])   [facility] Serafina Royals F, FNP   7 months ago Hypertension associated with type 2 diabetes mellitus [facility])   [facility] Bo Merino, FNP   11 months ago Essential hypertension, benign   [facility] Serafina Royals F, FNP   1 year ago Type 2 diabetes mellitus with diabetic nephropathy, with long-term current use of insulin [facility])   [facility] Kathrine Haddock, NP       Future Appointments             In 1 week [name], [place], [facility]

## 2022-08-14 NOTE — Telephone Encounter (Signed)
Medication Refill - Medication: insulin lispro (HUMALOG KWIKPEN) 100 UNIT/ML KwikPen  Patient is all out of this  Has the patient contacted their pharmacy? yes (Agent: If no, request that the patient contact the pharmacy for the refill. If patient does not wish to contact the pharmacy document the reason why and proceed with request.) (Agent: If yes, when and what did the pharmacy advise?)  Preferred Pharmacy (with phone number or street name[id number] [place] [address] [phone] Fax: [phone]    Has the patient been seen for an appointment in the last year OR does the patient have an upcoming appointment? yes  Agent: Please be advised that RX refills may take up to 3 business days. We ask that you follow-up with your pharmacy.

## 2022-08-15 ENCOUNTER — Ambulatory Visit: Payer: Medicare PPO

## 2022-08-15 DIAGNOSIS — E1159 Type 2 diabetes mellitus with other circulatory complications: Secondary | ICD-10-CM

## 2022-08-15 DIAGNOSIS — Z794 Long term (current) use of insulin: Secondary | ICD-10-CM

## 2022-08-15 MED ORDER — BD PEN NEEDLE MINI U/F 31G X 5 MM MISC: 11 refills | Status: DC

## 2022-08-15 NOTE — Progress Notes (Signed)
Care Management & Coordination Services Pharmacy Note  08/15/2022 Name:  Edgar [name] MRN:  [id number] DOB:  02-25-1946  Summary: Patient presents for CCM consult.   -Patient assistance for Humalog and Basaglar was approved, but Vania Rea was denied due to patient's income.   Recommendations/Changes made from today's visit: -STOP Jardiance -START Farxiga 10 mg daily with PAP.   Follow up plan: CPP follow-up 6 months   Subjective: Edgar [name] is an 77 y.o. year old male who is a primary patient of Delsa Grana, [place].  The care coordination team was consulted for assistance with disease management and care coordination needs.    Engaged with patient by telephone for follow up visit.  Patient Care Team: Delsa Grana, PA-C as PCP - General (Family Medicine) Germaine Pomfret, [facility] as Pharmacist (Pharmacist) [name], DPM as Consulting Physician (Podiatry) Orth[facility], [place]  Recent office visits: 04/23/22: Patient presented to Delsa Grana, PA-C for follow-up.   Recent consult visits: 07/19/22: Patient presented to Dr. Prudence Davidson (Podiatry)  05/01/22: Patient presented to Dr. Erlene Quan (Urology)   Hospital visits: None in previous 6 months   Objective:  Lab Results  Component Value Date   CREATININE 0.92 04/23/2022   BUN 13 04/23/2022   EGFR 87 04/23/2022   GFRNONAA >60 03/22/2022   GFRAA 80 08/01/2020   NA 140 04/23/2022   K 4.8 04/23/2022   CALCIUM 10.3 04/23/2022   CO2 28 04/23/2022   GLUCOSE 121 (H) 04/23/2022    Lab Results  Component Value Date/Time   HGBA1C 7.2 (H) 04/23/2022 10:41 AM   HGBA1C 7.0 (A) 12/21/2021 10:51 AM   HGBA1C 6.3 (H) 08/23/2021 01:47 PM   MICROALBUR 13.7 04/23/2022 10:41 AM   MICROALBUR 20.5 10/15/2019 10:58 AM    Last diabetic Eye exam:  Lab Results  Component Value Date/Time   HMDIABEYEEXA No Retinopathy 11/01/2021 12:00 AM    Last diabetic Foot exam: No results found for: "HMDIABFOOTEX"   Lab Results  Component Value Date    CHOL 127 08/23/2021   HDL 38 (L) 08/23/2021   LDLCALC 64 08/23/2021   TRIG 186 (H) 08/23/2021   CHOLHDL 3.3 08/23/2021       Latest Ref Rng & Units 04/23/2022   10:41 AM 08/23/2021    1:47 PM 10/28/2020    9:39 AM  Hepatic Function  Total Protein 6.1 - 8.1 g/dL 7.9  7.2  7.4   AST 10 - 35 U/L '14  16  13   '$ ALT 9 - 46 U/L '9  14  9   '$ Total Bilirubin 0.2 - 1.2 mg/dL 0.6  0.5  0.6     Lab Results  Component Value Date/Time   TSH 2.71 08/26/2017 12:21 PM   TSH 3.00 12/07/2016 09:57 AM       Latest Ref Rng & Units 03/22/2022   11:33 AM 08/23/2021    1:47 PM 11/12/2020   11:42 AM  CBC  WBC 4.0 - 10.5 K/uL 7.2  7.1  5.9   Hemoglobin 13.0 - 17.0 g/dL 13.3  15.5  15.4   Hematocrit 39.0 - 52.0 % 40.7  47.9  45.8   Platelets 150 - 400 K/uL 247  257  255     Lab Results  Component Value Date/Time   VD25OH 52 08/26/2017 12:21 PM   VD25OH 45.9 01/06/2016 09:18 AM    Clinical ASCVD: No  The ASCVD Risk score (Arnett DK, et al., 2019) failed to calculate for the following reasons:   The  valid total cholesterol range is 130 to 320 mg/dL       04/23/2022    9:42 AM 01/09/2022    9:09 AM 01/04/2022    8:43 AM  Depression screen PHQ 2/9  Decreased Interest 0 0 0  Down, Depressed, Hopeless 0 0 0  PHQ - 2 Score 0 0 0  Altered sleeping 0    Tired, decreased energy 0    Change in appetite 0    Feeling bad or failure about yourself  0    Trouble concentrating 0    Moving slowly or fidgety/restless 0    Suicidal thoughts 0    PHQ-9 Score 0    Difficult doing work/chores Not difficult at all      Social History   Tobacco Use  Smoking Status Former   Types: Cigarettes   Quit date: 09/01/1978   Years since quitting: 43.9  Smokeless Tobacco Never   BP Readings from Last 3 Encounters:  05/01/22 (!) 143/74  04/23/22 120/74  03/26/22 133/82   Pulse Readings from Last 3 Encounters:  05/01/22 73  04/23/22 82  03/26/22 60   Wt Readings from Last 3 Encounters:  05/01/22 271 lb  (122.9 kg)  04/23/22 271 lb 11.2 oz (123.2 kg)  03/26/22 250 lb (113.4 kg)   BMI Readings from Last 3 Encounters:  05/01/22 35.75 kg/m  04/23/22 35.85 kg/m  03/26/22 33.91 kg/m    No Known Allergies  Medications Reviewed Today     Reviewed by [name], DPM (Physician) on 07/19/22 at 39  Med List Status: <None>   Medication Order Taking? Sig Documenting Provider Last Dose Status Informant  ACCU-CHEK GUIDE test strip [id number] Yes  [provider] Taking Active   acetaminophen (TYLENOL) 500 MG tablet [id number] Yes Take 500 mg by mouth every 6 (six) hours as needed. [provider] Taking Active Multiple Informants           Med Note Tomasita Crumble, AMBER N   Fri Dec 07, 2016  9:14 AM) PRN  amLODipine (NORVASC) 10 MG tablet [id number] Yes Take 1 tablet (10 mg total) by mouth daily. Bo Merino, FNP Taking Active   aspirin EC 81 MG tablet [id number] Yes Take 81 mg by mouth daily. Swallow whole. [provider] Taking Active   atorvastatin (LIPITOR) 40 MG tablet [id number] Yes TAKE 1 TABLET BY MOUTH EVERYDAY AT BEDTIME Bo Merino, FNP Taking Active   cholecalciferol (VITAMIN D) 1000 units tablet [id number] Yes Take 1,000 Units by mouth 2 (two) times daily.  [provider] Taking Active Multiple Informants  empagliflozin (JARDIANCE) 25 MG TABS tablet [id number] Yes Take 1 tablet (25 mg total) by mouth daily. Bo Merino, FNP Taking Active   HUMALOG KWIKPEN 100 UNIT/ML KwikPen [id number] Yes INJECT 10 UNITS TOTAL INTO THE SKIN 3 (THREE) TIMES DAILY. Delsa Grana, PA-C Taking Active   insulin degludec (TRESIBA FLEXTOUCH) 100 UNIT/ML FlexTouch Pen [id number] Yes Inject 60 Units into the skin daily. Delsa Grana, PA-C Taking Active   Lancets MISC [id number] Yes Use as directed to monitor FSBS up to 4 x a day. Dx: E11.21 Z79.4 Delsa Grana, PA-C Taking Active   losartan (COZAAR) 100 MG tablet [id number] Yes TAKE 1 TABLET BY MOUTH EVERY DAY Bo Merino, FNP Taking Active   Omega-3 Fatty Acids (OMEGA-3 FISH OIL PO) [id number] Yes Take by mouth. 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[place]) 09/24/2017   History of colonoscopy with polypectomy 12/07/2016   Fatty liver 09/11/2016   Mixed hyperlipidemia 12/23/2015   Morbid obesity ([place]) 12/23/2015   Type 2 diabetes mellitus with diabetic nephropathy, 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[place]) 01/25/2015    Immunization History  Administered Date(s) Administered   Fluad Quad(high Dose 65+) 04/14/2019, 04/29/2020, 04/23/2022   Influenza, High Dose Seasonal PF 05/30/2015, 04/30/2017, 05/29/2018, 04/24/2021   Influenza-Unspecified 04/25/2016   Moderna Sars-Covid-2 Vaccination 09/18/2019, 10/16/2019, 07/05/2020   Pneumococcal Conjugate-13 10/08/2013   Pneumococcal Polysaccharide-23 08/26/2017   Pneumococcal-Unspecified 04/17/2010   Td 05/13/2009   Zoster, Live 10/11/2013     Compliance/Adherence/Medication fill history: Care Gaps: Shingrix Tdap  Covid   Star-Rating Drugs: Atorvastatin 40 mg last filled 05/29/22 for 90-DS Losartan 100 mg last filled 06/04/22 for 90-DS   SDOH:  (Social Determinants of Health) assessments and interventions performed: Yes SDOH Interventions    Flowsheet Row Clinical Support from 01/09/2022 in [place] [facility] from 09/20/2021 in [place] [facility] from 03/22/2021 in [place] from 01/05/2021 in [place] [facility] from 12/21/2020 in [place] [facility] from 10/26/2020 in  [facility]  SDOH Interventions        Food Insecurity Interventions Intervention Not Indicated -- -- -- -- --  Housing Interventions Intervention Not Indicated -- -- -- -- --  Transportation Interventions Intervention Not Indicated -- -- -- -- --  Financial Strain Interventions Intervention Not Indicated Intervention Not Indicated Intervention Not Indicated Intervention Not Indicated  [pt receiving assistance for insulin] Intervention Not Indicated Other (Comment)  [PAP]  Physical Activity Interventions Intervention Not Indicated -- -- -- -- --  Stress Interventions Intervention Not Indicated -- -- -- -- --  Social Connections Interventions Intervention Not Indicated -- -- -- -- --      SDOH Screenings   Food Insecurity: No Food Insecurity (01/09/2022)  Housing: Low Risk  (01/09/2022)  Transportation Needs: No Transportation Needs (01/09/2022)  Alcohol Screen: Low Risk  (01/09/2022)  Depression (PHQ2-9): Low Risk  (04/23/2022)  Financial Resource Strain: Low Risk  (01/09/2022)  Physical Activity: Sufficiently Active (01/09/2022)  Social Connections: Moderately Isolated (01/09/2022)  Stress: No Stress Concern Present (01/09/2022)  Tobacco Use: Medium Risk (07/19/2022)    Medication Assistance:  Basaglar, Humalog obtained through [employer] medication assistance program.  Enrollment ends Dec 2024  Medication Access: Within the past 30 days, how often has patient missed a dose of medication? No Is a pillbox or other method used to improve adherence? Yes  Factors that may affect medication adherence? no barriers identified Are meds synced by current pharmacy? No  Are meds delivered by current pharmacy? No  Does patient experience delays in picking up medications due to transportation concerns? No   [facility]stream Services Reviewed: Is patient disadvantaged to use [facility] Pharmacy?: No  Current Rx insurance plan: [employer] Name and location of [facility]  [id number] [place]: [phone]: [address] [place] [place]  Phone: [phone] Fax: [phone]  [facility] Pharmacy services reviewed with patient today?: No  Patient requests to transfer care to [facility] Pharmacy?: No  Reason patient declined to change pharmacies: Resistance to change   Assessment/Plan  Current Barriers:  Unable to independently afford treatment regimen  Pharmacist Clinical Goal(s):  Over the next 90 days, patient will verbalize ability to afford treatment regimen maintain control of diabetes as evidenced by A1c less than 8%  through collaboration with PharmD and provider.   Interventions: 1:1 collaboration with Delsa Grana, PA-C regarding development and update of comprehensive plan of care as evidenced by provider attestation and co-signature Inter-disciplinary care team collaboration (see longitudinal plan of care) Comprehensive medication review performed; medication list updated in electronic medical record  Hypertension (BP goal <140/90) -Controlled -Current treatment: Amlodipine 10 mg daily: Appropriate, Effective, Safe, Accessible Losartan 100 mg daily: Appropriate, Effective, Safe, Accessible  -Medications previously tried: NA  -Current home readings: NA -Denies hypotensive/hypertensive symptoms -Educated on Importance of home blood pressure monitoring; -Counseled to monitor BP at home weekly, document, and provide log at future appointments -Recommended to continue current medication  Hyperlipidemia: (LDL goal < 70) -Controlled -Current treatment: Atorvastatin 40 mg daily  -Medications previously tried: NA  -Educated on Importance of limiting foods high in cholesterol; -Recommended to continue current medication  Diabetes (A1c goal <8%) -Controlled -Current medications: Farxiga 10 mg daily :  Appropriate, Effective, Safe, Accessible  Humalog 10 units three times daily: Appropriate, Effective, Safe, Accessible  Basaglar 60 units nightly (0.47 u/kg): Appropriate, Effective, Safe, Accessible -Medications previously tried: NA  -Patient assistance for Humalog and Basaglar was approved, but Vania Rea was denied due to patient's income.  -Current home glucose readings: 116-209  -Denies hypoglycemic/hyperglycemic symptoms.  -STOP Jardiance -START Farxiga 10 mg daily with PAP.  -Recommend continuing current medications.  Malva Limes, [place] Pharmacist Practitioner  [facility] [phone]

## 2022-08-23 NOTE — Progress Notes (Signed)
Established Patient Office Visit  Subjective   Patient ID: Edgar [name], male    DOB: [birth year]  Age: 77 y.o. MRN: [id number]  Chief Complaint  Patient presents with   Follow-up   Diabetes   Hypertension   Hyperlipidemia    HPI  Patient is here to follow up for chronic medical conditions.   Hypertension: -Medications: Losartan 100 mg, Amlodipine 10 mg -Patient is compliant with above medications and reports no side effects. -Denies any SOB, CP, vision changes, LE edema or symptoms of hypotension  HLD: -Medications: Lipitor 40 mg -Patient is compliant with above medications and reports no side effects.  -Last lipid panel: Lipid Panel     Component Value Date/Time   CHOL 127 08/23/2021 1347   CHOL 129 12/23/2015 0959   TRIG 186 (H) 08/23/2021 1347   HDL 38 (L) 08/23/2021 1347   HDL 35 (L) 12/23/2015 0959   CHOLHDL 3.3 08/23/2021 1347   VLDL 15 12/07/2016 0957   LDLCALC 64 08/23/2021 1347   LABVLDL 14 12/23/2015 0959    Diabetes, Type 2: -Last A1c 9/23 7.2% -Medications: Farxiga 10 mg, Basaglar 60 units at bedtime, Humalog 10 units with meals  -Patient is compliant with the above medications and reports no side effects.  -Checking BG at home: didn't check sugar this morning  -Foot exam: Due -Microalbumin: UTD 9/23 -Statin: yes -PNA vaccine: Due -Denies symptoms of hypoglycemia, polyuria, polydipsia, numbness extremities, foot ulcers/trauma.      Review of Systems  All other systems reviewed and are negative.     Objective:     BP 132/76   Pulse 82   Temp 98 F (36.7 C) (Oral)   Resp 16   Ht '6\' 1"'$  (1.854 m)   Wt 272 lb 4.8 oz (123.5 kg)   SpO2 94%   BMI 35.93 kg/m  BP Readings from Last 3 Encounters:  08/24/22 132/76  05/01/22 (!) 143/74  04/23/22 120/74   Wt Readings from Last 3 Encounters:  08/24/22 272 lb 4.8 oz (123.5 kg)  05/01/22 271 lb (122.9 kg)  04/23/22 271 lb 11.2 oz (123.2 kg)      Physical Exam Constitutional:       Appearance: Normal appearance.  HENT:     Head: Normocephalic and atraumatic.     Ears:     Comments: HOH Eyes:     Conjunctiva/sclera: Conjunctivae normal.  Cardiovascular:     Rate and Rhythm: Normal rate and regular rhythm.  Pulmonary:     Effort: Pulmonary effort is normal.     Breath sounds: Normal breath sounds.  Musculoskeletal:     Right lower leg: No edema.     Left lower leg: No edema.     Right foot: Normal range of motion. No deformity, bunion, Charcot foot, foot drop or prominent metatarsal heads.     Left foot: Normal range of motion. No deformity, bunion, Charcot foot, foot drop or prominent metatarsal heads.  Feet:     Right foot:     Protective Sensation: 6 sites tested.  6 sites sensed.     Skin integrity: Skin integrity normal.     Toenail Condition: Right toenails are abnormally thick.     Left foot:     Protective Sensation: 6 sites tested.  6 sites sensed.     Skin integrity: Skin integrity normal.     Toenail Condition: Left toenails are abnormally thick.  Skin:    General: Skin is warm and dry.  Neurological:  General: No focal deficit present.     Mental Status: He is alert. Mental status is at baseline.  Psychiatric:        Mood and Affect: Mood normal.        Behavior: Behavior normal.      No results found for any visits on 08/24/22.  Last CBC Lab Results  Component Value Date   WBC 7.2 03/22/2022   HGB 13.3 03/22/2022   HCT 40.7 03/22/2022   MCV 85.3 03/22/2022   MCH 27.9 03/22/2022   RDW 14.6 03/22/2022   PLT 247 81/44/8185   Last metabolic panel Lab Results  Component Value Date   GLUCOSE 121 (H) 04/23/2022   NA 140 04/23/2022   K 4.8 04/23/2022   CL 106 04/23/2022   CO2 28 04/23/2022   BUN 13 04/23/2022   CREATININE 0.92 04/23/2022   EGFR 87 04/23/2022   CALCIUM 10.3 04/23/2022   PHOS 3.5 08/26/2017   PROT 7.9 04/23/2022   ALBUMIN 4.0 12/07/2016   LABGLOB 3.3 12/23/2015   AGRATIO 1.3 12/23/2015   BILITOT 0.6  04/23/2022   ALKPHOS 87 12/07/2016   AST 14 04/23/2022   ALT 9 04/23/2022   ANIONGAP 6 03/22/2022   Last lipids Lab Results  Component Value Date   CHOL 127 08/23/2021   HDL 38 (L) 08/23/2021   LDLCALC 64 08/23/2021   TRIG 186 (H) 08/23/2021   CHOLHDL 3.3 08/23/2021   Last hemoglobin A1c Lab Results  Component Value Date   HGBA1C 7.2 (H) 04/23/2022   Last thyroid functions Lab Results  Component Value Date   TSH 2.71 08/26/2017   Last vitamin D Lab Results  Component Value Date   VD25OH 52 08/26/2017   Last vitamin B12 and Folate No results found for: "VITAMINB12", "FOLATE"    The ASCVD Risk score (Arnett DK, et al., 2019) failed to calculate for the following reasons:   The valid total cholesterol range is 130 to 320 mg/dL    Assessment & Plan:   1. Type 2 diabetes mellitus with diabetic nephropathy, 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[place]): Check A1c today. Foot exam today as well. Continue Farxiga 10 mg, Basaglar 60 units at night and Humalog 10 units with meals, refilled.   - HgB A1c - HM Diabetes Foot Exam - insulin lispro (HUMALOG KWIKPEN) 100 UNIT/ML KwikPen; Inject 10 Units into the skin 3 (three) times daily. Patient receives via [employer] Patient Assistance through Dec 2024.  Dispense: 15 mL; Refill: 1  2. Essential hypertension, benign: Chronic, BP at goal today. Continue Amlodipine 10 mg, Losartan 100 mg daily, refilled. Follow up in 3 months.   - amLODipine (NORVASC) 10 MG tablet; Take 1 tablet (10 mg total) by mouth daily.  Dispense: 90 tablet; Refill: 3 - losartan (COZAAR) 100 MG tablet; Take 1 tablet (100 mg total) by mouth daily.  Dispense: 90 tablet; Refill: 1  3. Mixed hyperlipidemia: Lipid panel today. Continue Lipitor 40 mg, refilled.  - Lipid Profile - atorvastatin (LIPITOR) 40 MG tablet; TAKE 1 TABLET BY MOUTH EVERYDAY AT BEDTIME  Dispense: 90 tablet; Refill: 3  4. Prostate cancer screening: PSA with above labs.  - PSA  5.  Post-void dribbling: New, wearing depends. Will obtain UA today, patient does have a Urologist.   - POCT Urinalysis Dipstick  6. Vaccine for streptococcus pneumoniae and influenza: Prevnar 20 administered today.   - Pneumococcal conjugate vaccine 20-valent (Prevnar 20)   Return in about 3 months (around 11/23/2022).  [name], DO

## 2022-08-24 ENCOUNTER — Encounter: Payer: Self-pay | Admitting: Internal Medicine

## 2022-08-24 ENCOUNTER — Ambulatory Visit: Payer: Medicare PPO | Admitting: Family Medicine

## 2022-08-24 ENCOUNTER — Ambulatory Visit: Payer: Medicare PPO | Admitting: Internal Medicine

## 2022-08-24 VITALS — BP 132/76 | HR 82 | Temp 98.0°F | Resp 16 | Ht 73.0 in | Wt 272.3 lb

## 2022-08-24 DIAGNOSIS — E782 Mixed hyperlipidemia: Secondary | ICD-10-CM

## 2022-08-24 DIAGNOSIS — N3943 Post-void dribbling: Secondary | ICD-10-CM | POA: Diagnosis not present

## 2022-08-24 DIAGNOSIS — E1121 Type 2 diabetes mellitus with diabetic nephropathy: Secondary | ICD-10-CM | POA: Diagnosis not present

## 2022-08-24 DIAGNOSIS — Z23 Encounter for immunization: Secondary | ICD-10-CM

## 2022-08-24 DIAGNOSIS — Z125 Encounter for screening for malignant neoplasm of prostate: Secondary | ICD-10-CM | POA: Diagnosis not present

## 2022-08-24 DIAGNOSIS — Z794 Long term (current) use of insulin: Secondary | ICD-10-CM | POA: Diagnosis not present

## 2022-08-24 DIAGNOSIS — I10 Essential (primary) hypertension: Secondary | ICD-10-CM | POA: Diagnosis not present

## 2022-08-24 LAB — POCT URINALYSIS DIPSTICK
Bilirubin, UA: NEGATIVE
Glucose, UA: NEGATIVE
Ketones, UA: NEGATIVE
Leukocytes, UA: NEGATIVE
Nitrite, UA: NEGATIVE
Protein, UA: POSITIVE — AB
Spec Grav, UA: 1.01 (ref 1.010–1.025)
Urobilinogen, UA: 0.2 E.U./dL
pH, UA: 7.5 (ref 5.0–8.0)

## 2022-08-24 MED ORDER — INSULIN LISPRO (1 UNIT DIAL) 100 UNIT/ML (KWIKPEN): 10.0000 [IU] | PEN_INJECTOR | Freq: Three times a day (TID) | SUBCUTANEOUS | 1 refills | Status: DC

## 2022-08-24 MED ORDER — AMLODIPINE BESYLATE 10 MG PO TABS: 10.0000 mg | ORAL_TABLET | Freq: Every day | ORAL | 3 refills | Status: DC

## 2022-08-24 MED ORDER — LOSARTAN POTASSIUM 100 MG PO TABS: 100.0000 mg | ORAL_TABLET | Freq: Every day | ORAL | 1 refills | Status: DC

## 2022-08-24 MED ORDER — ATORVASTATIN CALCIUM 40 MG PO TABS: ORAL_TABLET | ORAL | 3 refills | Status: DC

## 2022-08-24 NOTE — Patient Instructions (Addendum)
It was great seeing you today!  Plan discussed at today's visit: -Blood work ordered today, results will be uploaded to MyChart.  -Urine test today -Prevnar 20 administered today -Medications refilled as well   Follow up in: 3 months   Take care and let us know if you have any questions or concerns prior to your next visit.  Dr. Rosana Berger

## 2022-08-25 ENCOUNTER — Other Ambulatory Visit: Payer: Self-pay | Admitting: Nurse Practitioner

## 2022-08-25 DIAGNOSIS — I10 Essential (primary) hypertension: Secondary | ICD-10-CM

## 2022-08-25 LAB — PSA: PSA: 2.6 ng/mL (ref <=4.00)

## 2022-08-25 LAB — HEMOGLOBIN A1C
Hgb A1c MFr Bld: 7.2 % of total Hgb — ABNORMAL HIGH (ref <5.7)
Mean Plasma Glucose: 160 mg/dL
eAG (mmol/L): 8.9 mmol/L

## 2022-08-25 LAB — LIPID PANEL
Cholesterol: 117 mg/dL (ref <200)
HDL: 37 mg/dL — ABNORMAL LOW (ref >=40)
LDL Cholesterol (Calc): 66 mg/dL (calc)
Non-HDL Cholesterol (Calc): 80 mg/dL (calc) (ref <130)
Total CHOL/HDL Ratio: 3.2 (calc) (ref <5.0)
Triglycerides: 60 mg/dL (ref <150)

## 2022-08-27 NOTE — Telephone Encounter (Signed)
Refused amlodipine because this is a duplicate request

## 2022-08-28 NOTE — Progress Notes (Signed)
08/29/2022 10:17 AM   Edgar [name] 1946/01/05 [id number]  Referring provider: Danelle Berry, PA-C [address] [place],  [place] [address]  Urological history: 1. 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 BPH with LU TS -PSA (08/2022) 2.60 -I PSS 15/6 -PVR 49 mL  Chief Complaint  Patient presents with   abnormal urine    HPI: Edgar [name] is a 76 y.o. male who presents today for positive blood in dipstick.    He was seen by his PCP recently with the complaints of postvoid dribbling.  His PCP obtained a PSA and a urine dipstick.  PSA returned within normal limits at 2.60 and urine dipstick was positive for blood and she advised him to contact us.  I PSS 15/6  PVR 49 mL  UA yellow slightly cloudy, 3+ glucose, specific gravity 1.020, trace blood, pH 5.5, 2+ protein, 0-5 WBCs, 0-2 RBCs, 0-10 epithelial cells and few mucus threads present  He states his "kidneys drain when they want to."  He is having issues with incontinence.  He is wearing pads and he is leaking through the pads.  "It drips when it wants to and I don't even feel it."  This has been going on for months. He is also having nocturia x 2-3.  Patient denies any modifying or aggravating factors.  Patient denies any gross hematuria, dysuria or suprapubic/flank pain.  Patient denies any fevers, chills, nausea or vomiting.     IPSS     Row Name 08/29/22 0900         International Prostate Symptom Score   How often have you had the sensation of not emptying your bladder? Not at All     How often have you had to urinate less than every two hours? Almost always     How often have you found you stopped and started again several times when you urinated? Not at All     How often have you found it difficult to postpone urination? Almost always     How often have you had a weak urinary stream? Not at All     How often have you had to strain to start urination? Not at All     How many times did you  typically get up at night to urinate? 5 Times     Total IPSS Score 15       Quality of Life due to urinary symptoms   If you were to spend the rest of your life with your urinary condition just the way it is now how would you feel about that? 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   Closed intertrochanteric fracture of left hip, sequela 05/30/2016   Coronary artery disease 10/25/2017   CT scan March 2019   Diabetes mellitus without complication (HCC)    Essential hypertension, benign 12/23/2015   History of colonoscopy with polypectomy 12/07/2016   Polypectomy x 12 per patient   Hx of pulmonary embolus 05/18/2016   Hospitalization Sept 2017, following hip fracture   Hx of tobacco use, presenting hazards to health 12/23/2015   Quit prior to 1997   Hx of tobacco use, presenting hazards to health 12/23/2015   Quit  prior to 1997    Hyperlipidemia 12/23/2015   Hypertension    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   Obesity 12/23/2015   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   Pulmonary embolism (HCC) 05/18/2016   Hospitalization Sept 2017, following hip fracture   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   left ant lat thigh, SCCis in a SK, schedule EDC   Squamous cell carcinoma of skin 08/14/2021   L ant lat thigh, EDC 09/07/21   Tinea pedis 06/25/2016    Surgical History: Past Surgical History:  Procedure Laterality Date   CIRCUMCISION N/A 03/26/2022   Procedure: CIRCUMCISION ADULT;  Surgeon: Vanna Scotland, MD;  Location: [facility] ORS;  Service: Urology;  Laterality: N/A;   COLONOSCOPY WITH PROPOFOL N/A 01/15/2017   Procedure: COLONOSCOPY WITH PROPOFOL;  Surgeon: Wyline Mood, MD;  Location: [facility] ENDOSCOPY;  Service: Endoscopy;  Laterality: N/A;   COLONOSCOPY  WITH PROPOFOL N/A 12/17/2019   Procedure: COLONOSCOPY WITH PROPOFOL;  Surgeon: Wyline Mood, MD;  Location: [facility] ENDOSCOPY;  Service: Gastroenterology;  Laterality: N/A;   EYE SURGERY Bilateral    cataract surgery   FRACTURE SURGERY     INTRAMEDULLARY (IM) NAIL INTERTROCHANTERIC Left 04/14/2016   Procedure: INTRAMEDULLARY (IM) NAIL INTERTROCHANTRIC;  Surgeon: Christena Flake, MD;  Location: [facility] ORS;  Service: Orthopedics;  Laterality: Left;   JOINT REPLACEMENT Left 04/2016   TOTAL HIP ARTHROPLASTY      Home Medications:  Allergies as of 08/29/2022   No Known Allergies      Medication List        Accurate as of August 29, 2022 10:17 AM. If you have any questions, ask your nurse or doctor.          Accu-Chek Guide test strip Generic drug: glucose blood   acetaminophen 500 MG tablet Commonly known as: TYLENOL Take 500 mg by mouth every 6 (six) hours as needed.   amLODipine 10 MG tablet Commonly known as: NORVASC Take 1 tablet (10 mg total) by mouth daily.   aspirin EC 81 MG tablet Take 81 mg by mouth daily. Swallow whole.   atorvastatin 40 MG tablet Commonly known as: LIPITOR TAKE 1 TABLET BY MOUTH EVERYDAY AT BEDTIME   B-D UF III MINI PEN NEEDLES 31G X 5 MM Misc Generic drug: Insulin Pen Needle Use to inject insulin four times daily   Basaglar KwikPen 100 UNIT/ML Inject 60 Units into the skin at bedtime. Patient receives via [employer] Patient Assistance through Dec 2024.   cholecalciferol 1000 units tablet Commonly known as: VITAMIN D Take 1,000 Units by mouth 2 (two) times daily.   dapagliflozin propanediol 10 MG Tabs tablet Commonly known as: FARXIGA Take 1 tablet (10 mg total) by mouth daily before breakfast. Patient receives via [employer] Patient Assistance   insulin lispro 100 UNIT/ML KwikPen Commonly known as: HumaLOG KwikPen Inject 10 Units into the skin 3 (three) times daily. Patient receives via [employer] Patient Assistance through Dec 2024.    Lancets Misc Use as directed to monitor FSBS up to 4 x a day. Dx: E11.21 Z79.4   losartan 100 MG tablet Commonly known as: COZAAR Take 1 tablet (100 mg total) by mouth daily.   mirabegron ER 50 MG Tb24 tablet Commonly known as: MYRBETRIQ Take 1 tablet (50 mg total) by mouth daily.   naproxen 500 MG tablet Commonly known as: NAPROSYN Take 500 mg by mouth 2 (two) times daily.   OMEGA-3 FISH OIL PO Take by mouth.  Allergies: No Known Allergies  Family History: Family History  Problem Relation Age of Onset   Cancer Mother    Diabetes Mother    Heart disease Father    Alcohol abuse Brother     Social History:  reports that he quit smoking about 44 years ago. His smoking use included cigarettes. He has never used smokeless tobacco. He reports that he does not drink alcohol and does not use drugs.  ROS: Pertinent ROS in HPI  Physical Exam: BP 108/68   Pulse 72   Ht 6' (1.829 m)   Wt 274 lb (124.3 kg)   BMI 37.16 kg/m   Constitutional:  Well nourished. Alert and oriented, No acute distress. HEENT: [place] AT, moist mucus membranes.  Trachea midline Cardiovascular: No clubbing, cyanosis, or edema. Respiratory: Normal respiratory effort, no increased work of breathing. Neurologic: Grossly intact, no focal deficits, moving all 4 extremities. Psychiatric: Normal mood and affect.  Laboratory Data: Lab Results  Component Value Date   WBC 7.2 03/22/2022   HGB 13.3 03/22/2022   HCT 40.7 03/22/2022   MCV 85.3 03/22/2022   PLT 247 03/22/2022    Lab Results  Component Value Date   CREATININE 0.92 04/23/2022    Lab Results  Component Value Date   PSA 2.60 08/24/2022    Lab Results  Component Value Date   HGBA1C 7.2 (H) 08/24/2022       Component Value Date/Time   CHOL 117 08/24/2022 1159   CHOL 129 12/23/2015 0959   HDL 37 (L) 08/24/2022 1159   HDL 35 (L) 12/23/2015 0959   CHOLHDL 3.2 08/24/2022 1159   VLDL 15 12/07/2016 0957   LDLCALC 66 08/24/2022  1159    Lab Results  Component Value Date   AST 14 04/23/2022   Lab Results  Component Value Date   ALT 9 04/23/2022  Urinalysis See EPIC and HPI I have reviewed the labs.   Pertinent Imaging:  08/29/22 09:29  Scan Result 49 ml    Assessment & Plan:    1. Abnormal urine -UA did not demonstrate any red blood cells on microscopy therefore the positive urine dips for blood are not concerning at this time -Acute microscopic hematuria is defined as 3 red blood cells or more seen on urine microscopy and if this is found sometime in the future we will need to pursue hematuria workup with CT urogram and cystoscopy and of course we will also pursue this if he should experience gross hematuria  2. Urge incontinence -He describes mostly some episodes of urge incontinence, but I suspect he may also have some leakage without awareness -Explained to him that his glucosuria is contributing to this, but in addition to having diabetes the medication that he takes to help control the diabetes also causes glucosuria -we will have a trial of Myrbetriq 50 mg daily, #42 samples are given  Return in about 6 weeks (around 10/10/2022) for IPSS and PVR.  These notes generated with voice recognition software. I apologize for typographical errors.  Cloretta Ned  [facility] [address]  [address] [place], [place] [address] [phone]

## 2022-08-29 ENCOUNTER — Ambulatory Visit: Payer: Medicare PPO | Admitting: Urology

## 2022-08-29 ENCOUNTER — Encounter: Payer: Self-pay | Admitting: Urology

## 2022-08-29 VITALS — BP 108/68 | HR 72 | Ht 72.0 in | Wt 274.0 lb

## 2022-08-29 DIAGNOSIS — N3941 Urge incontinence: Secondary | ICD-10-CM

## 2022-08-29 DIAGNOSIS — N3943 Post-void dribbling: Secondary | ICD-10-CM

## 2022-08-29 DIAGNOSIS — R8290 Unspecified abnormal findings in urine: Secondary | ICD-10-CM

## 2022-08-29 LAB — URINALYSIS, COMPLETE
Bilirubin, UA: NEGATIVE
Ketones, UA: NEGATIVE
Leukocytes,UA: NEGATIVE
Nitrite, UA: NEGATIVE
Specific Gravity, UA: 1.02 (ref 1.005–1.030)
Urobilinogen, Ur: 0.2 mg/dL (ref 0.2–1.0)
pH, UA: 5.5 (ref 5.0–7.5)

## 2022-08-29 LAB — MICROSCOPIC EXAMINATION

## 2022-08-29 LAB — BLADDER SCAN AMB NON-IMAGING: Scan Result: 49

## 2022-08-29 MED ORDER — MIRABEGRON ER 50 MG PO TB24: 50.0000 mg | ORAL_TABLET | Freq: Every day | ORAL | 0 refills | Status: DC

## 2022-08-30 ENCOUNTER — Other Ambulatory Visit: Payer: Self-pay | Admitting: Family Medicine

## 2022-08-30 DIAGNOSIS — E1121 Type 2 diabetes mellitus with diabetic nephropathy: Secondary | ICD-10-CM

## 2022-09-12 ENCOUNTER — Other Ambulatory Visit: Payer: Self-pay | Admitting: Family Medicine

## 2022-09-12 DIAGNOSIS — E1121 Type 2 diabetes mellitus with diabetic nephropathy: Secondary | ICD-10-CM

## 2022-09-28 ENCOUNTER — Other Ambulatory Visit: Payer: Self-pay | Admitting: Nurse Practitioner

## 2022-09-28 ENCOUNTER — Other Ambulatory Visit: Payer: Self-pay | Admitting: Family Medicine

## 2022-09-28 DIAGNOSIS — Z794 Long term (current) use of insulin: Secondary | ICD-10-CM

## 2022-09-28 NOTE — Telephone Encounter (Signed)
Alternative requested: PA required  Requested Prescriptions  Pending Prescriptions Disp Refills   FARXIGA 10 MG TABS tablet [Pharmacy Med Name: FARXIGA 10 MG TABLET] 90 tablet 1    Sig: TAKE 1 TABLET BY MOUTH [place]     Endocrinology:  Diabetes - SGLT2 Inhibitors Passed - 09/28/2022 12:17 PM      Passed - Cr in normal range and within 360 days    Creat  Date Value Ref Range Status  04/23/2022 0.92 0.70 - 1.28 mg/dL Final   Creatinine, Urine  Date Value Ref Range Status  04/23/2022 85 20 - 320 mg/dL Final         Passed - HBA1C is between 0 and 7.9 and within 180 days    Hgb A1c MFr Bld  Date Value Ref Range Status  08/24/2022 7.2 (H) <5.7 % of total Hgb Final    Comment:    For someone without known diabetes, a hemoglobin A1c value of 6.5% or greater indicates that they may have  diabetes and this should be confirmed with a follow-up  test. . For someone with known diabetes, a value <7% indicates  that their diabetes is well controlled and a value  greater than or equal to 7% indicates suboptimal  control. A1c targets should be individualized based on  duration of diabetes, age, comorbid conditions, and  other considerations. . Currently, no consensus exists regarding use of hemoglobin A1c for diagnosis of diabetes for children. .          Passed - eGFR in normal range and within 360 days    GFR, Est African American  Date Value Ref Range Status  08/01/2020 80 > OR = 60 mL/min/1.71m Final   GFR, Est Non African American  Date Value Ref Range Status  08/01/2020 69 > OR = 60 mL/min/1.741mFinal   GFR, Estimated  Date Value Ref Range Status  03/22/2022 >60 >60 mL/min Final    Comment:    (NOTE) Calculated using the CKD-EPI Creatinine Equation (2021)    eGFR  Date Value Ref Range Status  04/23/2022 87 > OR = 60 mL/min/1.738minal         Passed - Valid encounter within last 6 months    Recent Outpatient Visits           1 month ago  Type 2 diabetes mellitus with diabetic nephropathy, 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   5 months ago Hypertension associated with type 2 diabetes mellitus ([facility] GranaA-C   8 months ago Hypertension associated with type 2 diabetes mellitus ([facility] Royals FNP   9 months ago Hypertension associated with type 2 diabetes mellitus ([facility] Cent[facility]Bo MerinoNP   1 year ago Essential hypertension, benign   Con[facility] CenternBo MerinoNP       Future Appointments             In 1 week McGowan, ShaGordan Payment[facility] Centerology Bur[place]In 1 month TapDelsa GranaA-[facility]

## 2022-09-28 NOTE — Telephone Encounter (Signed)
FYI- will wait on doing the PA once I receive it.

## 2022-09-28 NOTE — Telephone Encounter (Signed)
Requested Prescriptions  Pending Prescriptions Disp Refills   FARXIGA 10 MG TABS tablet [Pharmacy Med Name: FARXIGA 10 MG TABLET] 90 tablet 1    Sig: TAKE 1 TABLET BY MOUTH [place]     Endocrinology:  Diabetes - SGLT2 Inhibitors Passed - 09/28/2022  1:39 AM      Passed - Cr in normal range and within 360 days    Creat  Date Value Ref Range Status  04/23/2022 0.92 0.70 - 1.28 mg/dL Final   Creatinine, Urine  Date Value Ref Range Status  04/23/2022 85 20 - 320 mg/dL Final         Passed - HBA1C is between 0 and 7.9 and within 180 days    Hgb A1c MFr Bld  Date Value Ref Range Status  08/24/2022 7.2 (H) <5.7 % of total Hgb Final    Comment:    For someone without known diabetes, a hemoglobin A1c value of 6.5% or greater indicates that they may have  diabetes and this should be confirmed with a follow-up  test. . For someone with known diabetes, a value <7% indicates  that their diabetes is well controlled and a value  greater than or equal to 7% indicates suboptimal  control. A1c targets should be individualized based on  duration of diabetes, age, comorbid conditions, and  other considerations. . Currently, no consensus exists regarding use of hemoglobin A1c for diagnosis of diabetes for children. .          Passed - eGFR in normal range and within 360 days    GFR, Est African American  Date Value Ref Range Status  08/01/2020 80 > OR = 60 mL/min/1.21m Final   GFR, Est Non African American  Date Value Ref Range Status  08/01/2020 69 > OR = 60 mL/min/1.775mFinal   GFR, Estimated  Date Value Ref Range Status  03/22/2022 >60 >60 mL/min Final    Comment:    (NOTE) Calculated using the CKD-EPI Creatinine Equation (2021)    eGFR  Date Value Ref Range Status  04/23/2022 87 > OR = 60 mL/min/1.7328minal         Passed - Valid encounter within last 6 months    Recent Outpatient Visits           1 month ago Type 2 diabetes mellitus with diabetic  nephropathy, 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[facility] Con[facility] CenterdTe[facility] MediciO   5 months ago Hypertension associated with type 2 diabetes mellitus ([facility] ConC[facility] CenterpDelsa GranaA-C   8 months ago Hypertension associated with type 2 diabetes mellitus ([facility] ConDel[facility] Medical CenternSerafina Royals FNP   9 months ago Hypertension associated with type 2 diabetes mellitus ([facility] Con[facility] CenternBo MerinoNP   1 year ago Essential hypertension, benign   ConTracy Medical CenternBo MerinoNP       Future Appointments             In 1 week McGowan, ShaGordan PaymentnDoctors Hospitalology BurShungnakIn 1 month TapDelsa GranaA-[facility] CenterECKaiser [facility]

## 2022-10-01 NOTE — Telephone Encounter (Signed)
Called pt and he stated to call his daughter [name] since he forget things. Spoke to patient daughter and she verbalized understanding

## 2022-10-09 NOTE — Progress Notes (Unsigned)
10/10/2022 3:31 PM   Edgar [name] 11-20-1945 [id number]  Referring provider: Delsa Grana, PA-C [address] [place],  [place] [address]  Urological history: 1. 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 BPH with LU TS -PSA (08/2022) 2.60 -I PSS *** -PVR *** mL  No chief complaint on file.   HPI: Edgar [name] is a 77 y.o. male who presents today for for 54-monthfollow-up after trial Myrbetriq 50 mg daily.  I PSS ***  PVR *** mL      Score:  1-7 Mild 8-19 Moderate 20-35 Severe    PMH: Past Medical History:  Diagnosis Date   [place] of [place] ([place] 09/11/2016   Noted on CT scan Sept 2017   Aneurysm of thoracic aorta ([place] 09/24/2017   Closed intertrochanteric fracture of left hip, sequela 05/30/2016   Coronary artery disease 10/25/2017   CT scan March 2019   Diabetes mellitus without complication ([place]    Essential hypertension, benign 12/23/2015   History of colonoscopy with polypectomy 12/07/2016   Polypectomy x 12 per patient   Hx of pulmonary embolus 05/18/2016   Hospitalization Sept 2017, following hip fracture   Hx of tobacco use, presenting hazards to health 12/23/2015   Quit prior to 1997   Hx of tobacco use, presenting hazards to health 12/23/2015   Quit prior to 1997    Hyperlipidemia 12/23/2015   Hypertension    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[place] 05/14/2017   Obesity 12/23/2015   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   [place] ([place] 05/18/2016   Hospitalization Sept 2017, following hip fracture   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   left ant lat thigh, SCCis in a SK, schedule EDC   Squamous cell carcinoma of skin 08/14/2021   L ant lat thigh, EDC 09/07/21   Tinea pedis 06/25/2016    Surgical History: Past Surgical History:  Procedure Laterality Date   CIRCUMCISION N/A 03/26/2022   Procedure: CIRCUMCISION ADULT;  Surgeon: BHollice Espy MD;  Location: [facility] ORS;  Service: Urology;   Laterality: N/A;   COLONOSCOPY WITH PROPOFOL N/A 01/15/2017   Procedure: COLONOSCOPY WITH PROPOFOL;  Surgeon: AJonathon Bellows MD;  [facility];  Service: Endoscopy;  Laterality: N/A;   COLONOSCOPY WITH PROPOFOL N/A 12/17/2019   Procedure: COLONOSCOPY WITH PROPOFOL;  Surgeon: AJonathon Bellows MD;  [facility];  Service: Gastroenterology;  Laterality: N/A;   EYE SURGERY Bilateral    cataract surgery   FRACTURE SURGERY     INTRAMEDULLARY (IM) NAIL INTERTROCHANTERIC Left 04/14/2016   Procedure: INTRAMEDULLARY (IM) NAIL INTERTROCHANTRIC;  Surgeon: JCorky Mull MD;  Location: [facility] ORS;  Service: Orthopedics;  Laterality: Left;   JOINT REPLACEMENT Left 04/2016   TOTAL HIP ARTHROPLASTY      Home Medications:  Allergies as of 10/10/2022   No Known Allergies      Medication List        Accurate as of October 09, 2022  3:31 PM. If you have any questions, ask your nurse or doctor.          Accu-Chek Guide test strip Generic drug: glucose blood USE IN THE MORNING AT NOON AND AT BEDTIME E11.21   acetaminophen 500 MG tablet Commonly known as: TYLENOL Take 500 mg by mouth every 6 (six) hours as needed.   amLODipine 10 MG tablet Commonly known as: NORVASC Take 1 tablet (10 mg total) by mouth daily.   aspirin EC 81 MG tablet Take 81 mg by mouth daily.  Swallow whole.   atorvastatin 40 MG tablet Commonly known as: LIPITOR TAKE 1 TABLET BY MOUTH EVERYDAY AT BEDTIME   B-D UF III MINI PEN NEEDLES 31G X 5 MM Misc Generic drug: Insulin Pen Needle Use to inject insulin four times daily   Basaglar KwikPen 100 UNIT/ML Inject 60 Units into the skin at bedtime. Patient receives via [employer] Patient Assistance through Dec 2024.   cholecalciferol 1000 units tablet Commonly known as: VITAMIN D Take 1,000 Units by mouth 2 (two) times daily.   Farxiga 10 MG Tabs tablet Generic drug: dapagliflozin propanediol TAKE 1 TABLET BY MOUTH EVERY DAY BEFORE BREAKFAST   insulin  lispro 100 UNIT/ML KwikPen Commonly known as: HumaLOG KwikPen Inject 10 Units into the skin 3 (three) times daily. Patient receives via [employer] Patient Assistance through Dec 2024.   Lancets Misc Use as directed to monitor FSBS up to 4 x a day. Dx: E11.21 Z79.4   losartan 100 MG tablet Commonly known as: COZAAR Take 1 tablet (100 mg total) by mouth daily.   mirabegron ER 50 MG Tb24 tablet Commonly known as: MYRBETRIQ Take 1 tablet (50 mg total) by mouth daily.   naproxen 500 MG tablet Commonly known as: NAPROSYN Take 500 mg by mouth 2 (two) times daily.   OMEGA-3 FISH OIL PO Take by mouth.        Allergies: No Known Allergies  Family History: Family History  Problem Relation Age of Onset   Cancer Mother    Diabetes Mother    Heart disease Father    Alcohol abuse Brother     Social History:  reports that he quit smoking about 44 years ago. His smoking use included cigarettes. He has never used smokeless tobacco. He reports that he does not drink alcohol and does not use drugs.  ROS: Pertinent ROS in HPI  Physical Exam: There were no vitals taken for this visit.  Constitutional:  Well nourished. Alert and oriented, No acute distress. HEENT: [place] AT, moist mucus membranes.  Trachea midline Cardiovascular: No clubbing, cyanosis, or edema. Respiratory: Normal respiratory effort, no increased work of breathing. GU: No CVA tenderness.  No bladder fullness or masses.  Patient with circumcised/uncircumcised phallus. ***Foreskin easily retracted***  Urethral meatus is patent.  No penile discharge. No penile lesions or rashes. Scrotum without lesions, cysts, rashes and/or edema.  Testicles are located scrotally bilaterally. No masses are appreciated in the testicles. Left and right epididymis are normal. Rectal: Patient with  normal sphincter tone. Anus and perineum without scarring or rashes. No rectal masses are appreciated. Prostate is approximately *** grams, *** nodules  are appreciated. Seminal vesicles are normal. Neurologic: Grossly intact, no focal deficits, moving all 4 extremities. Psychiatric: Normal mood and affect.   Laboratory Data: N/A   Pertinent Imaging: *** Assessment & Plan:    1. Urge incontinence -*** No follow-ups on file.  These notes generated with voice recognition software. I apologize for typographical errors.  [place], [place] [address]  [place], [place] [address] [phone]

## 2022-10-10 ENCOUNTER — Encounter: Payer: Self-pay | Admitting: Urology

## 2022-10-10 ENCOUNTER — Ambulatory Visit: Payer: Medicare PPO | Admitting: Urology

## 2022-10-10 VITALS — BP 159/76 | HR 71 | Ht 72.0 in | Wt 277.6 lb

## 2022-10-10 DIAGNOSIS — N3941 Urge incontinence: Secondary | ICD-10-CM

## 2022-10-10 LAB — BLADDER SCAN AMB NON-IMAGING: Scan Result: 111

## 2022-10-10 MED ORDER — MIRABEGRON ER 50 MG PO TB24: 50.0000 mg | ORAL_TABLET | Freq: Every day | ORAL | 3 refills | Status: DC

## 2022-10-10 MED ORDER — OXYBUTYNIN CHLORIDE ER 10 MG PO TB24: 10.0000 mg | ORAL_TABLET | Freq: Every day | ORAL | 0 refills | Status: DC

## 2022-10-11 ENCOUNTER — Telehealth: Payer: Self-pay

## 2022-10-11 NOTE — Progress Notes (Signed)
Care Management & Coordination Services Pharmacy Team Pharmacy Assistant   Name: Edgar [name]  MRN: [id number] DOB: Jan 01, 1946  Reason for Encounter: Diabetes  Contacted patient to discuss diabetes disease state. Spoke with patient and family on 10/12/2022   03/07 LVM on patient's phone and patient's daughter phone requesting a return call  Chart Updates:  Recent office visits:  08/24/2022 Edgar Medici, DO (PCP Office Visit) for Follow-up- No medication changes noted, Lab orders placed, Diabetic Foot Exam order placed, Patient to follow-up in 3 months  Recent consult visits:  10/10/2022 Edgar Council, PA-C (Urology) for Follow-up- No medication changes noted, No orders placed, BP: 159/76,   08/29/2022 Edgar Council, PA-C (Urology) for Abnormal Urine- Started: Mirabegron 50 mg daily, Lab orders placed, Bladder Scan order placed, Patient to follow-up in 6 weeks, Patient to follow-up in 3 months  Hospital visits:  None in previous 6 months  Medications: Outpatient Encounter Medications as of 10/11/2022  Medication Sig Note   acetaminophen (TYLENOL) 500 MG tablet Take 500 mg by mouth every 6 (six) hours as needed. 12/07/2016: PRN   amLODipine (NORVASC) 10 MG tablet Take 1 tablet (10 mg total) by mouth daily.    aspirin EC 81 MG tablet Take 81 mg by mouth daily. Swallow whole.    atorvastatin (LIPITOR) 40 MG tablet TAKE 1 TABLET BY MOUTH EVERYDAY AT BEDTIME    cholecalciferol (VITAMIN D) 1000 units tablet Take 1,000 Units by mouth 2 (two) times daily.     dapagliflozin propanediol (FARXIGA) 10 MG TABS tablet TAKE 1 TABLET BY MOUTH EVERY DAY BEFORE BREAKFAST    glucose blood (ACCU-CHEK GUIDE) test strip USE IN THE MORNING AT NOON AND AT BEDTIME E11.21    Insulin Glargine (BASAGLAR KWIKPEN) 100 UNIT/ML Inject 60 Units into the skin at bedtime. Patient receives via [employer] Patient Assistance through Dec 2024.    insulin lispro (HUMALOG KWIKPEN) 100 UNIT/ML KwikPen Inject 10 Units  into the skin 3 (three) times daily. Patient receives via [employer] Patient Assistance through Dec 2024.    Insulin Pen Needle (B-D UF III MINI PEN NEEDLES) 31G X 5 MM MISC Use to inject insulin four times daily    Lancets MISC Use as directed to monitor FSBS up to 4 x a day. Dx: E11.21 Z79.4    losartan (COZAAR) 100 MG tablet Take 1 tablet (100 mg total) by mouth daily.    mirabegron ER (MYRBETRIQ) 50 MG TB24 tablet Take 1 tablet (50 mg total) by mouth daily.    naproxen (NAPROSYN) 500 MG tablet Take 500 mg by mouth 2 (two) times daily.    Omega-3 Fatty Acids (OMEGA-3 FISH OIL PO) Take by mouth.    No facility-administered encounter medications on file as of 10/11/2022.    Recent Relevant Labs: Lab Results  Component Value Date/Time   HGBA1C 7.2 (H) 08/24/2022 11:59 AM   HGBA1C 7.2 (H) 04/23/2022 10:41 AM   MICROALBUR 13.7 04/23/2022 10:41 AM   MICROALBUR 20.5 10/15/2019 10:58 AM    Kidney Function Lab Results  Component Value Date/Time   CREATININE 0.92 04/23/2022 10:41 AM   CREATININE 0.97 03/22/2022 11:33 AM   CREATININE 1.20 11/09/2021 01:58 PM   CREATININE 1.10 08/23/2021 01:47 PM   GFRNONAA >60 03/22/2022 11:33 AM   GFRNONAA 69 08/01/2020 09:46 AM   GFRAA 80 08/01/2020 09:46 AM  Current antihyperglycemic regimen:  Farxiga 10 mg daily  Humalog 10 units three times daily Basaglar 60 units nightly     Patient verbally confirms  he is taking the above medications as directed. Yes  What diet changes have been made to improve diabetes control? Patient hasn 't made any recent changes  What recent interventions/DTPs have been made to improve glycemic control:  None ID  Have there been any recent hospitalizations or ED visits since last visit with PharmD? No  Patient denies hypoglycemic symptoms, including Pale, Sweaty, Shaky, Hungry, Nervous/irritable, and Vision changes  Patient denies hyperglycemic symptoms, including blurry vision, excessive thirst, fatigue, polyuria,  and weakness  How often are you checking your blood sugar? once daily  What are your blood sugars ranging?  Fasting: 153 today per patient normally around 150 or lower  During the week, how often does your blood glucose drop below 70? Never  Are you checking your feet daily/regularly? Yes  Adherence Review: Is the patient currently on a STATIN medication? Yes Is the patient currently on ACE/ARB medication? Yes Does the patient have >5 day gap between last estimated fill dates? No  Star Rating Drugs:  Atorvastatin 40 mg last filled on 08/24/2022 for a 90-Day supply with [facility] Farxiga 10 mg patient receives this via [employer] patient assistance program Losartan 100 mg last filled on 08/24/2022 for a 90-Day supply with [facility]  Care Gaps: Annual wellness visit in last year? Yes Last eye exam / retinopathy screening: 11/01/2021 Last diabetic foot exam: 08/24/2022  I spoke with the patient and his daughter. The patient stated today he feels well, and for the most part he is doing good. Patient did state his BP has been a little elevated since starting the Mybetriq. Patient doesn't have a blood pressure machine at home but I did advise his daughter to see if they can pick one up at [facility] and if needed the can speak with a pharmacist there regarding the best machine and correct cuff.  I did advise patient once he gets the machine to try and take his BP once daily, and to keep a log. Encouraged him if he has more abnormal readings than normal to give me a call.  Lynann Bologna, CPA/CMA Clinical Pharmacist Assistant Phone: [phone]

## 2022-10-16 ENCOUNTER — Encounter: Payer: Self-pay | Admitting: Family Medicine

## 2022-11-01 ENCOUNTER — Ambulatory Visit: Payer: Medicare PPO | Admitting: Podiatry

## 2022-11-01 ENCOUNTER — Encounter: Payer: Self-pay | Admitting: Podiatry

## 2022-11-01 DIAGNOSIS — Z794 Long term (current) use of insulin: Secondary | ICD-10-CM | POA: Diagnosis not present

## 2022-11-01 DIAGNOSIS — E1121 Type 2 diabetes mellitus with diabetic nephropathy: Secondary | ICD-10-CM

## 2022-11-01 DIAGNOSIS — B351 Tinea unguium: Secondary | ICD-10-CM

## 2022-11-01 DIAGNOSIS — M79676 Pain in unspecified toe(s): Secondary | ICD-10-CM | POA: Diagnosis not present

## 2022-11-01 NOTE — Progress Notes (Signed)
This patient returns to my office for at risk foot care.  This patient requires this care by a professional since this patient will be at risk due to having  Diabetes with diabetic nephropathy.  This patient is unable to cut nails himself since the patient cannot reach his nails.These nails are painful walking and wearing shoes.  This patient presents for at risk foot care today. ? ?General Appearance  Alert, conversant and in no acute stress. ? ?Vascular  Dorsalis pedis and posterior tibial  pulses are palpable  bilaterally.  Capillary return is within normal limits  bilaterally. Temperature is within normal limits  bilaterally. ? ?Neurologic  Senn-Weinstein monofilament wire test diminished  bilaterally. Muscle power within normal limits bilaterally. ? ?Nails Thick disfigured discolored nails with subungual debris  from hallux to fifth toes bilaterally. No evidence of bacterial infection or drainage bilaterally. ? ?Orthopedic  No limitations of motion  feet .  No crepitus or effusions noted.  No bony pathology or digital deformities noted.  HAV  B/L ? ?Skin  normotropic skin with no porokeratosis noted bilaterally.  No signs of infections or ulcers noted.    ? ?Onychomycosis  Pain in right toes  Pain in left toes ? ?Consent was obtained for treatment procedures.   Mechanical debridement of nails 1-5  bilaterally performed with a nail nipper.  Filed with dremel without incident.  ? ? ?Return office visit   3 months                   Told patient to return for periodic foot care and evaluation due to potential at risk complications. ? ? ?Jannelly Bergren DPM  ?

## 2022-11-07 DIAGNOSIS — E119 Type 2 diabetes mellitus without complications: Secondary | ICD-10-CM | POA: Diagnosis not present

## 2022-11-07 DIAGNOSIS — H35372 Puckering of macula, left eye: Secondary | ICD-10-CM | POA: Diagnosis not present

## 2022-11-07 DIAGNOSIS — H2511 Age-related nuclear cataract, right eye: Secondary | ICD-10-CM | POA: Diagnosis not present

## 2022-11-07 LAB — HM DIABETES EYE EXAM

## 2022-11-16 ENCOUNTER — Ambulatory Visit: Payer: Medicare PPO

## 2022-11-23 ENCOUNTER — Ambulatory Visit: Payer: Medicare PPO | Admitting: Family Medicine

## 2022-11-26 ENCOUNTER — Ambulatory Visit: Payer: Medicare PPO | Admitting: Family Medicine

## 2022-11-26 ENCOUNTER — Encounter: Payer: Self-pay | Admitting: Family Medicine

## 2022-11-26 VITALS — BP 116/74 | HR 91 | Temp 97.8°F | Resp 16 | Ht 72.0 in | Wt 276.1 lb

## 2022-11-26 DIAGNOSIS — R809 Proteinuria, unspecified: Secondary | ICD-10-CM

## 2022-11-26 DIAGNOSIS — E1121 Type 2 diabetes mellitus with diabetic nephropathy: Secondary | ICD-10-CM | POA: Diagnosis not present

## 2022-11-26 DIAGNOSIS — I10 Essential (primary) hypertension: Secondary | ICD-10-CM

## 2022-11-26 DIAGNOSIS — I700 Atherosclerosis of aorta: Secondary | ICD-10-CM

## 2022-11-26 DIAGNOSIS — Z794 Long term (current) use of insulin: Secondary | ICD-10-CM

## 2022-11-26 NOTE — Assessment & Plan Note (Signed)
Last A1C 7.2, fairly well controlled for age Pt managing DM with basal and mealtime insulin 60 units basal insulin and 10 units mealtime, farxiga 10 mg daily Reports good med compliance Pt has no SE from meds. Blood sugars - reports he "tries to keep it under 200" but he will never bring in glucometer or tell me fasting blood sugar readings - and A1C is always fairly stable and around 7.0-7.2 Denies hypoglycemia episodes Some urinary sx working with urology Recent pertinent labs: Lab Results  Component Value Date   HGBA1C 7.2 (H) 08/24/2022   HGBA1C 7.2 (H) 04/23/2022   HGBA1C 7.0 (A) 12/21/2021   Lab Results  Component Value Date   MICROALBUR 13.7 04/23/2022   LDLCALC 66 08/24/2022   CREATININE 0.92 04/23/2022   Standard of care and health maintenance: Urine Microalbumin:  positive - rechecking today Foot exam:  UTD DM eye exam:  UTD ACEI/ARB:  losartan 100  Statin:  atorvastatin 40 - lipids at goal  Reminded pt to do insulin lispro with meals and to hold if not eating, reviewed hypoglycemia and how to watch for it and manage No changes to meds right now - but I'm curious if addition of farxiga for + urine microalbumin has caused his increase in urinary sx

## 2022-11-26 NOTE — Assessment & Plan Note (Signed)
Pt managed on statin, lipids recently checked and at goal Good med compliance, and well tolerated, no changes Lab Results  Component Value Date   CHOL 117 08/24/2022   HDL 37 (L) 08/24/2022   LDLCALC 66 08/24/2022   TRIG 60 08/24/2022   CHOLHDL 3.2 08/24/2022

## 2022-11-26 NOTE — Progress Notes (Signed)
Name: Edgar [name]   MRN: [id number]    DOB: [birth year]   Date:11/26/2022       Progress Note  Chief Complaint  Patient presents with   Follow-up   Hypertension   Diabetes     Subjective:   Edgar [name] is a 77 y.o. male, presents to clinic for routine f/up   DM:   Blood sugars have been a little more up and down over the past couple months since seeing urology he says - not sure if its something to do with the new meds he's on - oxybutinin and myrbetriq DM - IDDM Last A1C 7.2 Tries to keep blood sugar under 200 Mealtime insulin 10 u Basal insulin 60 u Pt managing DM with farxiga, basal and mealtime insulin  Reports good med compliance Pt has no SE from meds Denies: vision changes, neuropathy, hypoglycemia Urinary sx have improved with working with urology Recent pertinent labs: Lab Results  Component Value Date   HGBA1C 7.2 (H) 08/24/2022   HGBA1C 7.2 (H) 04/23/2022   HGBA1C 7.0 (A) 12/21/2021   Lab Results  Component Value Date   MICROALBUR 13.7 04/23/2022   LDLCALC 66 08/24/2022   CREATININE 0.92 04/23/2022   Standard of care and health maintenance: Urine Microalbumin:  repeat today with recent positive Foot exam:  done DM eye exam:  done ACEI/ARB:  losartan Statin:  yes  Hypertension:  Currently managed on losaratn and amlodipine Pt reports good med compliance and denies any SE.   Blood pressure today is well controlled. BP Readings from Last 3 Encounters:  11/26/22 116/74  10/10/22 (!) 159/76  08/29/22 108/68   Pt denies CP, SOB, exertional sx, LE edema, palpitation, Ha's, visual disturbances, lightheadedness, hypotension, syncope.  HLD on atorvastatin - last OV lipids checked and well controlled   Current Outpatient Medications:    amLODipine (NORVASC) 10 MG tablet, Take 1 tablet (10 mg total) by mouth daily., Disp: 90 tablet, Rfl: 3   aspirin EC 81 MG tablet, Take 81 mg by mouth daily. Swallow whole., Disp: , Rfl:    atorvastatin (LIPITOR)  40 MG tablet, TAKE 1 TABLET BY MOUTH EVERYDAY AT BEDTIME, Disp: 90 tablet, Rfl: 3   cholecalciferol (VITAMIN D) 1000 units tablet, Take 1,000 Units by mouth 2 (two) times daily. , Disp: , Rfl:    dapagliflozin propanediol (FARXIGA) 10 MG TABS tablet, TAKE 1 TABLET BY MOUTH EVERY DAY BEFORE BREAKFAST, Disp: 90 tablet, Rfl: 1   glucose blood (ACCU-CHEK GUIDE) test strip, USE IN THE MORNING AT NOON AND AT BEDTIME E11.21, Disp: 100 strip, Rfl: 6   Insulin Glargine (BASAGLAR KWIKPEN) 100 UNIT/ML, Inject 60 Units into the skin at bedtime. Patient receives via [employer] Patient Assistance through Dec 2024., Disp: , Rfl:    insulin lispro (HUMALOG KWIKPEN) 100 UNIT/ML KwikPen, Inject 10 Units into the skin 3 (three) times daily. Patient receives via [employer] Patient Assistance through Dec 2024., Disp: 15 mL, Rfl: 1   Insulin Pen Needle (B-D UF III MINI PEN NEEDLES) 31G X 5 MM MISC, Use to inject insulin four times daily, Disp: 200 each, Rfl: 11   Lancets MISC, Use as directed to monitor FSBS up to 4 x a day. Dx: E11.21 Z79.4, Disp: 100 each, Rfl: 11   losartan (COZAAR) 100 MG tablet, Take 1 tablet (100 mg total) by mouth daily., Disp: 90 tablet, Rfl: 1   mirabegron ER (MYRBETRIQ) 50 MG TB24 tablet, Take 1 tablet (50 mg total) by mouth  daily., Disp: 90 tablet, Rfl: 3   naproxen (NAPROSYN) 500 MG tablet, Take 500 mg by mouth 2 (two) times daily., Disp: , Rfl:    Omega-3 Fatty Acids (OMEGA-3 FISH OIL PO), Take by mouth., Disp: , Rfl:    oxybutynin (DITROPAN-XL) 10 MG 24 hr tablet, Take 10 mg by mouth at bedtime., Disp: , Rfl:    acetaminophen (TYLENOL) 500 MG tablet, Take 500 mg by mouth every 6 (six) hours as needed. (Patient not taking: Reported on 11/26/2022), Disp: , Rfl:   Patient Active Problem List   Diagnosis Date Noted   Hypertension associated with type 2 diabetes mellitus 12/21/2021   Acquired trigger finger of right index finger 12/30/2020   Aortic atherosclerosis 08/01/2020   Coronary  artery disease 10/25/2017   Aneurysm of thoracic aorta 09/24/2017   History of colonoscopy with polypectomy 12/07/2016   Fatty liver 09/11/2016   Mixed hyperlipidemia 12/23/2015   Morbid obesity 12/23/2015   Type 2 diabetes mellitus with diabetic nephropathy, with long-term current use of insulin 01/25/2015    Past Surgical History:  Procedure Laterality Date   CIRCUMCISION N/A 03/26/2022   Procedure: CIRCUMCISION ADULT;  Surgeon: Vanna Scotland, MD;  Location: [facility] ORS;  Service: Urology;  Laterality: N/A;   COLONOSCOPY WITH PROPOFOL N/A 01/15/2017   Procedure: COLONOSCOPY WITH PROPOFOL;  Surgeon: Wyline Mood, MD;  Location: [facility] ENDOSCOPY;  Service: Endoscopy;  Laterality: N/A;   COLONOSCOPY WITH PROPOFOL N/A 12/17/2019   Procedure: COLONOSCOPY WITH PROPOFOL;  Surgeon: Wyline Mood, MD;  Location: [facility] ENDOSCOPY;  Service: Gastroenterology;  Laterality: N/A;   EYE SURGERY Bilateral    cataract surgery   FRACTURE SURGERY     INTRAMEDULLARY (IM) NAIL INTERTROCHANTERIC Left 04/14/2016   Procedure: INTRAMEDULLARY (IM) NAIL INTERTROCHANTRIC;  Surgeon: Christena Flake, MD;  Location: [facility] ORS;  Service: Orthopedics;  Laterality: Left;   JOINT REPLACEMENT Left 04/2016   TOTAL HIP ARTHROPLASTY      Family History  Problem Relation Age of Onset   Cancer Mother    Diabetes Mother    Heart disease Father    Alcohol abuse Brother     Social History   Tobacco Use   Smoking status: Former    Types: Cigarettes    Quit date: 09/01/1978    Years since quitting: 44.2   Smokeless tobacco: Never  Vaping Use   Vaping Use: Never used  Substance Use Topics   Alcohol use: No    Alcohol/week: 0.0 standard drinks of alcohol   Drug use: No     No Known Allergies  Health Maintenance  Topic Date Due   DTaP/Tdap/Td (2 - Tdap) 05/14/2019   Medicare Annual Wellness (AWV)  01/10/2023   COVID-19 Vaccine (4 - 2023-24 season) 12/12/2022 (Originally 04/06/2022)   Zoster Vaccines- Shingrix (1 of 2)  02/25/2023 (Originally 01/19/1966)   HEMOGLOBIN A1C  02/22/2023   INFLUENZA VACCINE  03/07/2023   Diabetic kidney evaluation - eGFR measurement  04/24/2023   Diabetic kidney evaluation - Urine ACR  04/24/2023   FOOT EXAM  08/25/2023   OPHTHALMOLOGY EXAM  11/07/2023   COLONOSCOPY (Pts 45-32yrs Insurance coverage will need to be confirmed)  12/17/2024   Pneumonia Vaccine 35+ Years old  Completed   Hepatitis C Screening  Completed   HPV VACCINES  Aged Out    Chart Review Today: I personally reviewed active problem list, medication list, allergies, family history, social history, health maintenance, notes from last encounter, lab results, imaging with the patient/caregiver today.   Review of  Systems  Constitutional: Negative.   HENT: Negative.    Eyes: Negative.   Respiratory: Negative.    Cardiovascular: Negative.   Gastrointestinal: Negative.   Endocrine: Negative.   Genitourinary: Negative.   Musculoskeletal: Negative.   Skin: Negative.   Allergic/Immunologic: Negative.   Neurological: Negative.   Hematological: Negative.   Psychiatric/Behavioral: Negative.    All other systems reviewed and are negative.    Objective:   Vitals:   11/26/22 1040  BP: 116/74  Pulse: 91  Resp: 16  Temp: 97.8 F (36.6 C)  TempSrc: Oral  SpO2: 93%  Weight: 276 lb 1.6 oz (125.2 kg)  Height: 6' (1.829 m)    Body mass index is 37.45 kg/m.  Physical Exam Vitals and nursing note reviewed.  Constitutional:      General: He is not in acute distress.    Appearance: Normal appearance. He is well-developed and well-groomed. He is morbidly obese. He is not ill-appearing, toxic-appearing or diaphoretic.  HENT:     Head: Normocephalic and atraumatic.     Right Ear: External ear normal.     Left Ear: External ear normal.     Nose: Nose normal.     Mouth/Throat:     Mouth: Mucous membranes are moist.  Eyes:     General: No scleral icterus.       Right eye: No discharge.        Left eye:  No discharge.     Conjunctiva/sclera: Conjunctivae normal.  Cardiovascular:     Rate and Rhythm: Normal rate and regular rhythm.     Pulses: Normal pulses.     Heart sounds: Normal heart sounds. No murmur heard.    No friction rub. No gallop.  Pulmonary:     Effort: Pulmonary effort is normal. No respiratory distress.     Breath sounds: Normal breath sounds. No stridor. No wheezing or rales.  Abdominal:     General: Bowel sounds are normal.  Musculoskeletal:     Right lower leg: No edema.     Left lower leg: No edema.  Skin:    General: Skin is warm and dry.     Coloration: Skin is not jaundiced or pale.  Neurological:     Mental Status: He is alert. Mental status is at baseline.     Gait: Gait normal.  Psychiatric:        Mood and Affect: Mood normal.        Behavior: Behavior normal. Behavior is cooperative.         Assessment & Plan:   Problem List Items Addressed This Visit       Cardiovascular and Mediastinum   Essential hypertension, benign (Chronic)    Stable, well controlled BP at goal today BP Readings from Last 3 Encounters:  11/26/22 116/74  10/10/22 (!) 159/76  08/29/22 108/68  On losartan 100 mg and amlodipine 10 mg daily - good compliance, no med SE Continue same meds       Relevant Orders   COMPLETE METABOLIC PANEL WITH GFR   Aortic atherosclerosis    Pt managed on statin, lipids recently checked and at goal Good med compliance, and well tolerated, no changes Lab Results  Component Value Date   CHOL 117 08/24/2022   HDL 37 (L) 08/24/2022   LDLCALC 66 08/24/2022   TRIG 60 08/24/2022   CHOLHDL 3.2 08/24/2022          Endocrine   Type 2 diabetes mellitus with diabetic nephropathy, with long-term current  use of insulin - Primary    Last A1C 7.2, fairly well controlled for age Pt managing DM with basal and mealtime insulin 60 units basal insulin and 10 units mealtime, farxiga 10 mg daily Reports good med compliance Pt has no SE from  meds. Blood sugars - reports he "tries to keep it under 200" but he will never bring in glucometer or tell me fasting blood sugar readings - and A1C is always fairly stable and around 7.0-7.2 Denies hypoglycemia episodes Some urinary sx working with urology Recent pertinent labs: Lab Results  Component Value Date   HGBA1C 7.2 (H) 08/24/2022   HGBA1C 7.2 (H) 04/23/2022   HGBA1C 7.0 (A) 12/21/2021   Lab Results  Component Value Date   MICROALBUR 13.7 04/23/2022   LDLCALC 66 08/24/2022   CREATININE 0.92 04/23/2022   Standard of care and health maintenance: Urine Microalbumin:  positive - rechecking today Foot exam:  UTD DM eye exam:  UTD ACEI/ARB:  losartan 100  Statin:  atorvastatin 40 - lipids at goal  Reminded pt to do insulin lispro with meals and to hold if not eating, reviewed hypoglycemia and how to watch for it and manage No changes to meds right now - but I'm curious if addition of farxiga for + urine microalbumin has caused his increase in urinary sx       Relevant Orders   COMPLETE METABOLIC PANEL WITH GFR   Hemoglobin A1c   Microalbumin / creatinine urine ratio     Other   Morbid obesity (Chronic)    Wt Readings from Last 5 Encounters:  11/26/22 276 lb 1.6 oz (125.2 kg)  10/10/22 277 lb 9.6 oz (125.9 kg)  08/29/22 274 lb (124.3 kg)  08/24/22 272 lb 4.8 oz (123.5 kg)  05/01/22 271 lb (122.9 kg)   BMI Readings from Last 5 Encounters:  11/26/22 37.45 kg/m  10/10/22 37.65 kg/m  08/29/22 37.16 kg/m  08/24/22 35.93 kg/m  05/01/22 35.75 kg/m  Morbid obesity overall weight stable and unchanged over the past year Associated comorbidities of HLD, HTN, IDDM, hepatic steatosis, CAD Pt offered referral to weight management options, referral to DM education and nutritionist/RD consult, overall encouraged to continue healthy diet, cutting back on caloric intake, focusing on healthy food with plenty of fruits and veggies in diet      Other Visit Diagnoses      Urine test positive for microalbuminuria       on farxiga - not sure if he is going to tolerate this with increased urinary sx, recheck UACR today   Relevant Orders   COMPLETE METABOLIC PANEL WITH GFR   Hemoglobin A1c   Microalbumin / creatinine urine ratio        Return in about 3 months (around 02/25/2023) for IDDM.   Danelle Berry, PA-C 11/26/22 10:51 AM

## 2022-11-26 NOTE — Assessment & Plan Note (Signed)
Wt Readings from Last 5 Encounters:  11/26/22 276 lb 1.6 oz (125.2 kg)  10/10/22 277 lb 9.6 oz (125.9 kg)  08/29/22 274 lb (124.3 kg)  08/24/22 272 lb 4.8 oz (123.5 kg)  05/01/22 271 lb (122.9 kg)   BMI Readings from Last 5 Encounters:  11/26/22 37.45 kg/m  10/10/22 37.65 kg/m  08/29/22 37.16 kg/m  08/24/22 35.93 kg/m  05/01/22 35.75 kg/m   Morbid obesity overall weight stable and unchanged over the past year Associated comorbidities of HLD, HTN, IDDM, hepatic steatosis, CAD Pt offered referral to weight management options, referral to DM education and nutritionist/RD consult, overall encouraged to continue healthy diet, cutting back on caloric intake, focusing on healthy food with plenty of fruits and veggies in diet

## 2022-11-26 NOTE — Assessment & Plan Note (Signed)
Stable, well controlled BP at goal today BP Readings from Last 3 Encounters:  11/26/22 116/74  10/10/22 (!) 159/76  08/29/22 108/68  On losartan 100 mg and amlodipine 10 mg daily - good compliance, no med SE Continue same meds

## 2022-11-27 LAB — COMPLETE METABOLIC PANEL WITH GFR
AG Ratio: 1.1 (calc) (ref 1.0–2.5)
ALT: 9 U/L (ref 9–46)
AST: 14 U/L (ref 10–35)
Albumin: 4.1 g/dL (ref 3.6–5.1)
Alkaline phosphatase (APISO): 74 U/L (ref 35–144)
BUN: 15 mg/dL (ref 7–25)
CO2: 28 mmol/L (ref 20–32)
Calcium: 10.3 mg/dL (ref 8.6–10.3)
Chloride: 105 mmol/L (ref 98–110)
Creat: 1.06 mg/dL (ref 0.70–1.28)
Globulin: 3.6 g/dL (calc) (ref 1.9–3.7)
Glucose, Bld: 129 mg/dL — ABNORMAL HIGH (ref 65–99)
Potassium: 5 mmol/L (ref 3.5–5.3)
Sodium: 139 mmol/L (ref 135–146)
Total Bilirubin: 0.7 mg/dL (ref 0.2–1.2)
Total Protein: 7.7 g/dL (ref 6.1–8.1)
eGFR: 73 mL/min/{1.73_m2} (ref >=60)

## 2022-11-27 LAB — HEMOGLOBIN A1C
Hgb A1c MFr Bld: 7.4 % of total Hgb — ABNORMAL HIGH (ref <5.7)
Mean Plasma Glucose: 166 mg/dL
eAG (mmol/L): 9.2 mmol/L

## 2022-11-27 LAB — MICROALBUMIN / CREATININE URINE RATIO
Creatinine, Urine: 103 mg/dL (ref 20–320)
Microalb Creat Ratio: 249 mg/g creat — ABNORMAL HIGH (ref <30)
Microalb, Ur: 25.6 mg/dL

## 2022-11-29 ENCOUNTER — Ambulatory Visit: Payer: Medicare PPO | Admitting: Family Medicine

## 2022-11-29 ENCOUNTER — Encounter: Payer: Self-pay | Admitting: Family Medicine

## 2022-11-29 ENCOUNTER — Telehealth: Payer: Self-pay | Admitting: Family Medicine

## 2022-11-29 VITALS — BP 112/66 | HR 83 | Temp 97.4°F | Resp 16 | Ht 72.0 in | Wt 273.7 lb

## 2022-11-29 DIAGNOSIS — R399 Unspecified symptoms and signs involving the genitourinary system: Secondary | ICD-10-CM

## 2022-11-29 DIAGNOSIS — Z794 Long term (current) use of insulin: Secondary | ICD-10-CM | POA: Diagnosis not present

## 2022-11-29 DIAGNOSIS — R809 Proteinuria, unspecified: Secondary | ICD-10-CM

## 2022-11-29 DIAGNOSIS — E1121 Type 2 diabetes mellitus with diabetic nephropathy: Secondary | ICD-10-CM | POA: Diagnosis not present

## 2022-11-29 MED ORDER — BASAGLAR KWIKPEN 100 UNIT/ML ~~LOC~~ SOPN
60.0000 [IU] | PEN_INJECTOR | Freq: Every day | SUBCUTANEOUS | Status: DC
Start: 2022-11-29 — End: 2023-07-02

## 2022-11-29 NOTE — Assessment & Plan Note (Signed)
Plan to get better glycemic control with insulin dose adjustement and pt to continue farxiga 10 mg daily

## 2022-11-29 NOTE — Assessment & Plan Note (Signed)
Uncontrolled with slightly increasing A1C and + UACR CBG log book reviewed with pt today - this is excellent, he has never brought it in before or been able to tell me about his readings - only that he tries to keep it under 200 Fasting morning CBG average looks to be around to over 150's for the past month Increase basal insulin dose and monitor CBGs - with fasting CBG goal a little lower 100 to less than 150, watch for hypoglycemia (decrease dose if having lows - but no history of lows) Continue farxiga F/up in 3 months for labs and with log book

## 2022-11-29 NOTE — Telephone Encounter (Signed)
Pt daughter notified verbalized understanding 

## 2022-11-29 NOTE — Assessment & Plan Note (Signed)
Pt recently working with urology, but unfortunately his medications were confused at the pharmacy  I have discussed with Roxan Hockey (urology) and confirmed current management plan - pt is to be taking myrbetriq 50 mg daily, and oxybutynin is d/c   We have called the pharmacy to ensure they know pt was not supposed to be on med And we will call pt/pt daughter to also confirm med plan Urology also agrees with focusing on improving A1C and UACR with insulin adjustment and continuing farxiga - and then secondarily they will assist with managing LUTS

## 2022-11-29 NOTE — Progress Notes (Signed)
Name: ANTONIOS OSTROW   MRN: [id number]    DOB: 11/19/1945   Date:11/29/2022       Progress Note  Chief Complaint  Patient presents with   Follow-up   Diabetes     Subjective:   TIANDRE TEALL is a 77 y.o. male, presents to clinic for f/up on worsening labs related to IDDM  A1C increasing a gradually with new + UACR Brings in CBG book Recent pertinent labs: Lab Results  Component Value Date   HGBA1C 7.4 (H) 11/26/2022   HGBA1C 7.2 (H) 08/24/2022   HGBA1C 7.2 (H) 04/23/2022          No hypoglycemia  Basal insulin 60 units QHS, mealtime 10 units TID  Current Outpatient Medications:    acetaminophen (TYLENOL) 500 MG tablet, Take 500 mg by mouth every 6 (six) hours as needed. (Patient not taking: Reported on 11/26/2022), Disp: , Rfl:    amLODipine (NORVASC) 10 MG tablet, Take 1 tablet (10 mg total) by mouth daily., Disp: 90 tablet, Rfl: 3   aspirin EC 81 MG tablet, Take 81 mg by mouth daily. Swallow whole., Disp: , Rfl:    atorvastatin (LIPITOR) 40 MG tablet, TAKE 1 TABLET BY MOUTH EVERYDAY AT BEDTIME, Disp: 90 tablet, Rfl: 3   cholecalciferol (VITAMIN D) 1000 units tablet, Take 1,000 Units by mouth 2 (two) times daily. , Disp: , Rfl:    dapagliflozin propanediol (FARXIGA) 10 MG TABS tablet, TAKE 1 TABLET BY MOUTH EVERY DAY BEFORE BREAKFAST, Disp: 90 tablet, Rfl: 1   glucose blood (ACCU-CHEK GUIDE) test strip, USE IN THE MORNING AT NOON AND AT BEDTIME E11.21, Disp: 100 strip, Rfl: 6   Insulin Glargine (BASAGLAR KWIKPEN) 100 UNIT/ML, Inject 60 Units into the skin at bedtime. Patient receives via [employer] Patient Assistance through Dec 2024., Disp: , Rfl:    insulin lispro (HUMALOG KWIKPEN) 100 UNIT/ML KwikPen, Inject 10 Units into the skin 3 (three) times daily. Patient receives via [employer] Patient Assistance through Dec 2024., Disp: 15 mL, Rfl: 1   Insulin Pen Needle (B-D UF III MINI PEN NEEDLES) 31G X 5 MM MISC, Use to inject insulin four times daily, Disp: 200 each,  Rfl: 11   Lancets MISC, Use as directed to monitor FSBS up to 4 x a day. Dx: E11.21 Z79.4, Disp: 100 each, Rfl: 11   losartan (COZAAR) 100 MG tablet, Take 1 tablet (100 mg total) by mouth daily., Disp: 90 tablet, Rfl: 1   mirabegron ER (MYRBETRIQ) 50 MG TB24 tablet, Take 1 tablet (50 mg total) by mouth daily., Disp: 90 tablet, Rfl: 3   naproxen (NAPROSYN) 500 MG tablet, Take 500 mg by mouth 2 (two) times daily., Disp: , Rfl:    Omega-3 Fatty Acids (OMEGA-3 FISH OIL PO), Take by mouth., Disp: , Rfl:   Patient Active Problem List   Diagnosis Date Noted   Hypertension associated with type 2 diabetes mellitus 12/21/2021   Acquired trigger finger of right index finger 12/30/2020   Aortic atherosclerosis 08/01/2020   Coronary artery disease 10/25/2017   Aneurysm of thoracic aorta 09/24/2017   History of colonoscopy with polypectomy 12/07/2016   Fatty liver 09/11/2016   Essential hypertension, benign 12/23/2015   Mixed hyperlipidemia 12/23/2015   Morbid obesity 12/23/2015   Type 2 diabetes mellitus with diabetic nephropathy, with long-term current use of insulin 01/25/2015    Past Surgical History:  Procedure Laterality Date   CIRCUMCISION N/A 03/26/2022   Procedure: CIRCUMCISION ADULT;  Surgeon: Vanna Scotland,  MD;  Location: [facility] ORS;  Service: Urology;  Laterality: N/A;   COLONOSCOPY WITH PROPOFOL N/A 01/15/2017   Procedure: COLONOSCOPY WITH PROPOFOL;  Surgeon: Wyline Mood, MD;  Location: [facility] ENDOSCOPY;  Service: Endoscopy;  Laterality: N/A;   COLONOSCOPY WITH PROPOFOL N/A 12/17/2019   Procedure: COLONOSCOPY WITH PROPOFOL;  Surgeon: Wyline Mood, MD;  Location: [facility] ENDOSCOPY;  Service: Gastroenterology;  Laterality: N/A;   EYE SURGERY Bilateral    cataract surgery   FRACTURE SURGERY     INTRAMEDULLARY (IM) NAIL INTERTROCHANTERIC Left 04/14/2016   Procedure: INTRAMEDULLARY (IM) NAIL INTERTROCHANTRIC;  Surgeon: Christena Flake, MD;  Location: [facility] ORS;  Service: Orthopedics;  Laterality:  Left;   JOINT REPLACEMENT Left 04/2016   TOTAL HIP ARTHROPLASTY      Family History  Problem Relation Age of Onset   Cancer Mother    Diabetes Mother    Heart disease Father    Alcohol abuse Brother     Social History   Tobacco Use   Smoking status: Former    Types: Cigarettes    Quit date: 09/01/1978    Years since quitting: 44.2   Smokeless tobacco: Never  Vaping Use   Vaping Use: Never used  Substance Use Topics   Alcohol use: No    Alcohol/week: 0.0 standard drinks of alcohol   Drug use: No     No Known Allergies  Health Maintenance  Topic Date Due   DTaP/Tdap/Td (2 - Tdap) 05/14/2019   Medicare Annual Wellness (AWV)  01/10/2023   COVID-19 Vaccine (4 - 2023-24 season) 12/12/2022 (Originally 04/06/2022)   Zoster Vaccines- Shingrix (1 of 2) 02/25/2023 (Originally 01/19/1966)   INFLUENZA VACCINE  03/07/2023   HEMOGLOBIN A1C  05/28/2023   FOOT EXAM  08/25/2023   OPHTHALMOLOGY EXAM  11/07/2023   Diabetic kidney evaluation - eGFR measurement  11/26/2023   Diabetic kidney evaluation - Urine ACR  11/26/2023   COLONOSCOPY (Pts 45-75yrs Insurance coverage will need to be confirmed)  12/17/2024   Pneumonia Vaccine 64+ Years old  Completed   Hepatitis C Screening  Completed   HPV VACCINES  Aged Out    Chart Review Today: I personally reviewed active problem list, medication list, allergies, family history, social history, health maintenance, notes from last encounter, lab results, imaging with the patient/caregiver today.   Review of Systems  Constitutional: Negative.   HENT: Negative.    Eyes: Negative.   Respiratory: Negative.    Cardiovascular: Negative.   Gastrointestinal: Negative.   Endocrine: Negative.   Genitourinary: Negative.   Musculoskeletal: Negative.   Skin: Negative.   Allergic/Immunologic: Negative.   Neurological: Negative.   Hematological: Negative.   Psychiatric/Behavioral: Negative.    All other systems reviewed and are negative.     Objective:   Vitals:   11/29/22 1146  BP: 112/66  Pulse: 83  Resp: 16  Temp: (!) 97.4 F (36.3 C)  TempSrc: Oral  SpO2: 94%  Weight: 273 lb 11.2 oz (124.1 kg)  Height: 6' (1.829 m)    Body mass index is 37.12 kg/m.  PE today carried forward from last weeks visit - unchanged, well appearing   Physical Exam Vitals and nursing note reviewed.  Constitutional:      General: He is not in acute distress.    Appearance: Normal appearance. He is well-developed and well-groomed. He is morbidly obese. He is not ill-appearing, toxic-appearing or diaphoretic.  HENT:     Head: Normocephalic and atraumatic.     Right Ear: External ear normal.  Left Ear: External ear normal.     Nose: Nose normal.     Mouth/Throat:     Mouth: Mucous membranes are moist.  Eyes:     General: No scleral icterus.       Right eye: No discharge.        Left eye: No discharge.     Conjunctiva/sclera: Conjunctivae normal.  Cardiovascular:     Rate and Rhythm: Normal rate and regular rhythm.     Pulses: Normal pulses.     Heart sounds: Normal heart sounds. No murmur heard.    No friction rub. No gallop.  Pulmonary:     Effort: Pulmonary effort is normal. No respiratory distress.     Breath sounds: Normal breath sounds. No stridor. No wheezing or rales.  Abdominal:     General: Bowel sounds are normal.  Musculoskeletal:     Right lower leg: No edema.     Left lower leg: No edema.  Skin:    General: Skin is warm and dry.     Coloration: Skin is not jaundiced or pale.  Neurological:     Mental Status: He is alert. Mental status is at baseline.     Gait: Gait normal.  Psychiatric:        Mood and Affect: Mood normal.        Behavior: Behavior normal. Behavior is cooperative.         Assessment & Plan:   Problem List Items Addressed This Visit       Endocrine   Type 2 diabetes mellitus with diabetic nephropathy, with long-term current use of insulin - Primary    Uncontrolled with  slightly increasing A1C and + UACR CBG log book reviewed with pt today - this is excellent, he has never brought it in before or been able to tell me about his readings - only that he tries to keep it under 200 Fasting morning CBG average looks to be around to over 150's for the past month Increase basal insulin dose and monitor CBGs - with fasting CBG goal a little lower 100 to less than 150, watch for hypoglycemia (decrease dose if having lows - but no history of lows) Continue farxiga F/up in 3 months for labs and with log book      Relevant Medications   Insulin Glargine (BASAGLAR KWIKPEN) 100 UNIT/ML     Other   Lower urinary tract symptoms (LUTS)    Pt recently working with urology, but unfortunately his medications were confused at the pharmacy  I have discussed with Roxan Hockey (urology) and confirmed current management plan - pt is to be taking myrbetriq 50 mg daily, and oxybutynin is d/c   We have called the pharmacy to ensure they know pt was not supposed to be on med And we will call pt/pt daughter to also confirm med plan Urology also agrees with focusing on improving A1C and UACR with insulin adjustment and continuing farxiga - and then secondarily they will assist with managing LUTS      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

## 2022-11-29 NOTE — Telephone Encounter (Signed)
Did you cal her? 

## 2022-11-29 NOTE — Telephone Encounter (Signed)
Daughter Edgar [name] has called during lunch as states she received a fu call after her dad's visit this am. She is sorry she missed the call and would appreciate a fu call at [phone]. She has taken her phone off "do not disturb"

## 2022-12-03 ENCOUNTER — Telehealth (INDEPENDENT_AMBULATORY_CARE_PROVIDER_SITE_OTHER): Payer: Self-pay | Admitting: Vascular Surgery

## 2022-12-03 NOTE — Telephone Encounter (Signed)
LVM with pt TCB and schedule:  CT results - see JD

## 2022-12-17 ENCOUNTER — Ambulatory Visit
Admission: RE | Admit: 2022-12-17 | Discharge: 2022-12-17 | Disposition: A | Discharge status: Home / Self Care | Payer: Medicare PPO | Source: Ambulatory Visit | Attending: Nurse Practitioner | Admitting: Nurse Practitioner

## 2022-12-17 DIAGNOSIS — Z01818 Encounter for other preprocedural examination: Secondary | ICD-10-CM | POA: Diagnosis not present

## 2022-12-17 DIAGNOSIS — I7121 Aneurysm of the ascending aorta, without rupture: Secondary | ICD-10-CM | POA: Insufficient documentation

## 2022-12-17 MED ORDER — IOHEXOL 350 MG/ML SOLN
75.0000 mL | Freq: Once | INTRAVENOUS | Status: AC | PRN
  Administered 2022-12-17: 75 mL via INTRAVENOUS

## 2022-12-25 ENCOUNTER — Ambulatory Visit (INDEPENDENT_AMBULATORY_CARE_PROVIDER_SITE_OTHER): Payer: Medicare PPO | Admitting: Vascular Surgery

## 2022-12-25 VITALS — BP 127/69 | HR 66 | Resp 16 | Ht 72.0 in | Wt 275.0 lb

## 2022-12-25 DIAGNOSIS — E1121 Type 2 diabetes mellitus with diabetic nephropathy: Secondary | ICD-10-CM | POA: Diagnosis not present

## 2022-12-25 DIAGNOSIS — I10 Essential (primary) hypertension: Secondary | ICD-10-CM | POA: Diagnosis not present

## 2022-12-25 DIAGNOSIS — I7121 Aneurysm of the ascending aorta, without rupture: Secondary | ICD-10-CM

## 2022-12-25 DIAGNOSIS — Z794 Long term (current) use of insulin: Secondary | ICD-10-CM

## 2022-12-25 NOTE — Assessment & Plan Note (Signed)
blood pressure control important in reducing the progression of atherosclerotic disease and aneurysmal growth. On appropriate oral medications.  

## 2022-12-25 NOTE — Progress Notes (Signed)
MRN : [id number]  Edgar [name] is a 76 y.o. ([birth year]) male who presents with chief complaint of  Chief Complaint  Patient presents with   Follow-up  .  History of Present Illness: Patient returns today in follow up of his thoracic aortic aneurysm.  He is doing well without any current chest pain, back pain, or signs of peripheral embolization.  He has undergone a recent CT scan of the chest which I have independently reviewed.  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  Current Outpatient Medications  Medication Sig Dispense Refill   acetaminophen (TYLENOL) 500 MG tablet Take 500 mg by mouth every 6 (six) hours as needed.     amLODipine (NORVASC) 10 MG tablet Take 1 tablet (10 mg total) by mouth daily. 90 tablet 3   aspirin EC 81 MG tablet Take 81 mg by mouth daily. Swallow whole.     atorvastatin (LIPITOR) 40 MG tablet TAKE 1 TABLET BY MOUTH EVERYDAY AT BEDTIME 90 tablet 3   cholecalciferol (VITAMIN D) 1000 units tablet Take 1,000 Units by mouth 2 (two) times daily.      dapagliflozin propanediol (FARXIGA) 10 MG TABS tablet TAKE 1 TABLET BY MOUTH EVERY DAY BEFORE BREAKFAST 90 tablet 1   glucose blood (ACCU-CHEK GUIDE) test strip USE IN THE MORNING AT NOON AND AT BEDTIME E11.21 100 strip 6   Insulin Glargine (BASAGLAR KWIKPEN) 100 UNIT/ML Inject 60-65 Units into the skin at bedtime. Patient receives via [employer] Patient Assistance through Dec 2024.     insulin lispro (HUMALOG KWIKPEN) 100 UNIT/ML KwikPen Inject 10 Units into the skin 3 (three) times daily. Patient receives via [employer] Patient Assistance through Dec 2024. 15 mL 1   Insulin Pen Needle (B-D UF III MINI PEN NEEDLES) 31G X 5 MM MISC Use to inject insulin four times daily 200 each 11   Lancets MISC Use as directed to monitor FSBS up to 4 x a day. Dx: E11.21 Z79.4 100 each 11   losartan (COZAAR) 100 MG tablet Take 1 tablet (100 mg total) by  mouth daily. 90 tablet 1   mirabegron ER (MYRBETRIQ) 50 MG TB24 tablet Take 1 tablet (50 mg total) by mouth daily. 90 tablet 3   naproxen (NAPROSYN) 500 MG tablet Take 500 mg by mouth 2 (two) times daily.     Omega-3 Fatty Acids (OMEGA-3 FISH OIL PO) Take by mouth.     No current facility-administered medications for this visit.    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   Closed intertrochanteric fracture of left hip, sequela 05/30/2016   Coronary artery disease 10/25/2017   CT scan March 2019   Diabetes mellitus without complication (HCC)    Essential hypertension, benign 12/23/2015   History of colonoscopy with polypectomy 12/07/2016   Polypectomy x 12 per patient   Hx of pulmonary embolus 05/18/2016   Hospitalization Sept 2017, following hip fracture   Hx of tobacco use, presenting hazards to health 12/23/2015   Quit prior to 1997   Hx of tobacco use, presenting hazards to health 12/23/2015   Quit prior to 1997    Hyperlipidemia 12/23/2015   Hypertension    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   Obesity 12/23/2015   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   Pulmonary embolism (HCC)  05/18/2016   Hospitalization Sept 2017, following hip fracture   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   left ant lat thigh, SCCis in a SK, schedule EDC   Squamous cell carcinoma of skin 08/14/2021   L ant lat thigh, EDC 09/07/21   Tinea pedis 06/25/2016    Past Surgical History:  Procedure Laterality Date   CIRCUMCISION N/A 03/26/2022   Procedure: CIRCUMCISION ADULT;  Surgeon: Vanna Scotland, MD;  Location: [facility] ORS;  Service: Urology;  Laterality: N/A;   COLONOSCOPY WITH PROPOFOL N/A 01/15/2017   Procedure: COLONOSCOPY WITH PROPOFOL;  Surgeon: Wyline Mood, MD;  Location: [facility] ENDOSCOPY;  Service: Endoscopy;  Laterality: N/A;   COLONOSCOPY WITH  PROPOFOL N/A 12/17/2019   Procedure: COLONOSCOPY WITH PROPOFOL;  Surgeon: Wyline Mood, MD;  Location: [facility] ENDOSCOPY;  Service: Gastroenterology;  Laterality: N/A;   EYE SURGERY Bilateral    cataract surgery   FRACTURE SURGERY     INTRAMEDULLARY (IM) NAIL INTERTROCHANTERIC Left 04/14/2016   Procedure: INTRAMEDULLARY (IM) NAIL INTERTROCHANTRIC;  Surgeon: Christena Flake, MD;  Location: [facility] ORS;  Service: Orthopedics;  Laterality: Left;   JOINT REPLACEMENT Left 04/2016   TOTAL HIP ARTHROPLASTY       Social History   Tobacco Use   Smoking status: Former    Types: Cigarettes    Quit date: 09/01/1978    Years since quitting: 44.3   Smokeless tobacco: Never  Vaping Use   Vaping Use: Never used  Substance Use Topics   Alcohol use: No    Alcohol/week: 0.0 standard drinks of alcohol   Drug use: No       Family History  Problem Relation Age of Onset   Cancer Mother    Diabetes Mother    Heart disease Father    Alcohol abuse Brother      No Known Allergies   REVIEW OF SYSTEMS (Negative unless checked)  Constitutional: [] Weight loss  [] Fever  [] Chills Cardiac: [] Chest pain   [] Chest pressure   [] Palpitations   [] Shortness of breath when laying flat   [] Shortness of breath at rest   [] Shortness of breath with exertion. Vascular:  [] Pain in legs with walking   [] Pain in legs at rest   [] Pain in legs when laying flat   [] Claudication   [] Pain in feet when walking  [] Pain in feet at rest  [] Pain in feet when laying flat   [] History of DVT   [] Phlebitis   [] Swelling in legs   [] Varicose veins   [] Non-healing ulcers Pulmonary:   [] Uses home oxygen   [] Productive cough   [] Hemoptysis   [] Wheeze  [] COPD   [] Asthma Neurologic:  [] Dizziness  [] Blackouts   [] Seizures   [] History of stroke   [] History of TIA  [] Aphasia   [] Temporary blindness   [] Dysphagia   [] Weakness or numbness in arms   [] Weakness or numbness in legs Musculoskeletal:  [x] Arthritis   [] Joint swelling   [x] Joint pain   [] Low  back pain Hematologic:  [] Easy bruising  [] Easy bleeding   [] Hypercoagulable state   [] Anemic   Gastrointestinal:  [] Blood in stool   [] Vomiting blood  [] Gastroesophageal reflux/heartburn   [] Abdominal pain Genitourinary:  [] Chronic kidney disease   [] Difficult urination  [] Frequent urination  [] Burning with urination   [] Hematuria Skin:  [] Rashes   [] Ulcers   [] Wounds Psychological:  [] History of anxiety   []  History of major depression.  Physical Examination  BP 127/69 (BP Location: Right Arm)   Pulse 66   Resp 16  Ht 6' (1.829 m)   Wt 275 lb (124.7 kg)   BMI 37.30 kg/m  Gen:  WD/WN, NAD. Appears younger than stated age. Head: [place]/AT, No temporalis wasting. Ear/Nose/Throat: Hearing grossly intact, nares w/o erythema or drainage Eyes: Conjunctiva clear. Sclera non-icteric Neck: Supple.  Trachea midline Pulmonary:  Good air movement, no use of accessory muscles.  Cardiac: RRR, no JVD Vascular:  Vessel Right Left  Radial Palpable Palpable                   Musculoskeletal: M/S 5/5 throughout.  No deformity or atrophy. Trace LE edema. Neurologic: Sensation grossly intact in extremities.  Symmetrical.  Speech is fluent.  Psychiatric: Judgment intact, Mood & affect appropriate for pt's clinical situation. Dermatologic: No rashes or ulcers noted.  No cellulitis or open wounds.      Labs Recent Results (from the past 2160 hour(s))  Bladder Scan (Post Void Residual) in office     Status: None   Collection Time: 10/10/22  9:37 AM  Result Value Ref Range   Scan Result 111   HM DIABETES EYE EXAM     Status: None   Collection Time: 11/07/22 12:00 AM  Result Value Ref Range   HM Diabetic Eye Exam No Retinopathy No Retinopathy    Comment: [facility]  COMPLETE METABOLIC PANEL WITH GFR     Status: Abnormal   Collection Time: 11/26/22 11:22 AM  Result Value Ref Range   Glucose, Bld 129 (H) 65 - 99 mg/dL    Comment: .            Fasting reference interval . For  someone without known diabetes, a glucose value >125 mg/dL indicates that they may have diabetes and this should be confirmed with a follow-up test. .    BUN 15 7 - 25 mg/dL   Creat 4.09 8.11 - 9.14 mg/dL   eGFR 73 > OR = 60 NW/GNF/6.21H0   BUN/Creatinine Ratio SEE NOTE: 6 - 22 (calc)    Comment:    Not Reported: BUN and Creatinine are within    reference range. .    Sodium 139 135 - 146 mmol/L   Potassium 5.0 3.5 - 5.3 mmol/L   Chloride 105 98 - 110 mmol/L   CO2 28 20 - 32 mmol/L   Calcium 10.3 8.6 - 10.3 mg/dL   Total Protein 7.7 6.1 - 8.1 g/dL   Albumin 4.1 3.6 - 5.1 g/dL   Globulin 3.6 1.9 - 3.7 g/dL (calc)   AG Ratio 1.1 1.0 - 2.5 (calc)   Total Bilirubin 0.7 0.2 - 1.2 mg/dL   Alkaline phosphatase (APISO) 74 35 - 144 U/L   AST 14 10 - 35 U/L   ALT 9 9 - 46 U/L  Hemoglobin A1c     Status: Abnormal   Collection Time: 11/26/22 11:22 AM  Result Value Ref Range   Hgb A1c MFr Bld 7.4 (H) <5.7 % of total Hgb    Comment: For someone without known diabetes, a hemoglobin A1c value of 6.5% or greater indicates that they may have  diabetes and this should be confirmed with a follow-up  test. . For someone with known diabetes, a value <7% indicates  that their diabetes is well controlled and a value  greater than or equal to 7% indicates suboptimal  control. A1c targets should be individualized based on  duration of diabetes, age, comorbid conditions, and  other considerations. . Currently, no consensus exists regarding use of hemoglobin  A1c for diagnosis of diabetes for children. .    Mean Plasma Glucose 166 mg/dL   eAG (mmol/L) 9.2 mmol/L    Comment: . This test was performed on the Roche cobas c503 platform. Effective 05/14/22, a change in test platforms from the [employer] Architect to the Roche cobas c503 may have shifted HbA1c results compared to historical results. Based on laboratory validation testing conducted at [facility], the Roche platform relative to the  [employer] platform had an average increase in HbA1c value of < or = 0.3%. This difference is within accepted  variability established by the [facility]. Note that not all individuals will have had a shift in their results and direct comparisons between historical and current results for testing conducted on different platforms is not recommended.   Microalbumin / creatinine urine ratio     Status: Abnormal   Collection Time: 11/26/22 11:22 AM  Result Value Ref Range   Creatinine, Urine 103 20 - 320 mg/dL   Microalb, Ur 16.1 mg/dL    Comment: Reference Range Not established    Microalb Creat Ratio 249 (H) <30 mg/g creat    Comment: . The ADA defines abnormalities in albumin excretion as follows: Marland Kitchen Albuminuria Category        Result (mg/g creatinine) . Normal to Mildly increased   <30 Moderately increased         30-299  Severely increased           > OR = 300 . The ADA recommends that at least two of three specimens collected within a 3-6 month period be abnormal before considering a patient to be within a diagnostic category.     Radiology CT ANGIO CHEST AORTA W/CM & OR WO/CM  Result Date: 12/17/2022 CLINICAL DATA:  Thoracic aortic disease.  Preoperative planning. EXAM: CT ANGIOGRAPHY CHEST WITH CONTRAST TECHNIQUE: Multidetector CT imaging of the chest was performed using the standard protocol during bolus administration of intravenous contrast. Multiplanar CT image reconstructions and MIPs were obtained to evaluate the vascular anatomy. RADIATION DOSE REDUCTION: This exam was performed according to the departmental dose-optimization program which includes automated exposure control, adjustment of the mA and/or kV according to patient size and/or use of iterative reconstruction technique. CONTRAST:  75mL OMNIPAQUE IOHEXOL 350 MG/ML SOLN COMPARISON:  Chest CT-11/09/2021; 01/02/2020; 12/02/2019; 01/02/2019; 10/25/2017; 09/11/2016; 04/15/2016  FINDINGS: Vascular Findings: Stable fusiform aneurysmal dilatation of the ascending thoracic aorta with measurements as follows. The thoracic aorta tapers to a normal caliber at the level of the aortic arch. Atherosclerotic plaque within the aortic arch and descending thoracic aorta, not resulting in hemodynamically significant stenosis. The descending thoracic aorta is mildly tortuous but of normal caliber and widely patent without hemodynamically significant narrowing Conventional configuration of the aortic arch. There is a minimal amount of calcified atherosclerotic plaque involving the origin and proximal aspects of the left subclavian artery, not resulting in a hemodynamically significant stenosis. The branch vessels of the aortic arch appear patent throughout their imaged courses. Borderline cardiomegaly. Coronary artery calcifications. No pericardial effusion though trace amount of fluid is seen with the pericardial recess. Although this examination was not tailored for the evaluation the pulmonary arteries, there are no discrete filling defects within the central pulmonary arterial tree to suggest central pulmonary embolism. Enlarged caliber of the main pulmonary artery measuring 37 mm in diameter (image 75, series 4), unchanged. ------------------------------------------------------------- Thoracic aortic measurements: SINOTUBULAR JUNCTION: 45 mm as measured in greatest oblique short axis coronal dimension. PROXIMAL ASCENDING THORACIC AORTA:  45 mm as measured in greatest oblique short axis axial dimension (axial image 79, series 4) at the level of the main pulmonary artery and approximately 45 mm as measured in greatest oblique short axis coronal dimension (coronal image 96, series 7), unchanged compared to the 04/2016 examination by my direct remeasurement. AORTIC ARCH: 33 mm as measured in greatest oblique short axis sagittal dimension. PROXIMAL DESCENDING THORACIC AORTA: 36 mm as measured in greatest  oblique short axis axial dimension at the level of the main pulmonary artery. DISTAL DESCENDING THORACIC AORTA: 31 mm as measured in greatest oblique short axis axial dimension at the level of the diaphragmatic hiatus. Review of the MIP images confirms the above findings. ------------------------------------------------------------- Non-Vascular Findings: Mediastinum/Lymph Nodes: No bulky mediastinal, hilar or axillary lymph adenopathy. Lungs/Pleura: Minimal bibasilar subsegmental atelectasis. No focal airspace opacities. No pleural effusion or pneumothorax. The central pulmonary airways appear widely patent. Focal bronchiectasis involving a solitary left apical bronchi (image 31, series 5) is unchanged since the 11/2021 examination. Central pulmonary airways appear widely patent. Upper abdomen: Limited early arterial phase evaluation of the upper abdomen is unremarkable. Musculoskeletal: No acute or aggressive osseous abnormalities. Stigmata of dish throughout the thoracic spine. Regional soft tissues appear. The thyroid gland appears atrophic but without discrete nodule. IMPRESSION: 1. Stable uncomplicated fusiform aneurysmal dilatation of the ascending thoracic aorta measuring 45 mm in diameter, unchanged compared to the 04/2016 examination. Aortic aneurysm NOS (ICD10-I71.9). 2. Similar findings of borderline cardiomegaly with enlargement of the caliber of the main pulmonary artery, nonspecific though could be seen in the setting of pulmonary arterial hypertension. Further evaluation with cardiac echo could be performed as indicated. 3. Aortic Atherosclerosis (ICD10-I70.0). Electronically Signed   By: Simonne Come M.D.   On: 12/17/2022 12:00    Assessment/Plan  Aneurysm of thoracic aorta (HCC) He has undergone a recent CT scan of the chest which I have independently reviewed.  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  This is  stable without any risk to him currently.  No role for intervention at this size.  Continue to monitor on an annual basis with CT scans of the chest.  Essential hypertension, benign blood pressure control important in reducing the progression of atherosclerotic disease and aneurysmal growth. On appropriate oral medications.   Type 2 diabetes mellitus with diabetic nephropathy, 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HCC) blood glucose control important in reducing the progression of atherosclerotic disease. Also, involved in wound healing. On appropriate medications.    Festus Barren, MD  12/25/2022 12:15 PM    This note was created with Dragon medical transcription system.  Any errors from dictation are purely unintentional

## 2022-12-25 NOTE — Assessment & Plan Note (Signed)
blood glucose control important in reducing the progression of atherosclerotic disease. Also, involved in wound healing. On appropriate medications.  

## 2022-12-25 NOTE — Assessment & Plan Note (Signed)
He has undergone a recent CT scan of the chest which I have independently reviewed.  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  This is stable without any risk to him currently.  No role for intervention at this size.  Continue to monitor on an annual basis with CT scans of the chest.

## 2023-01-09 ENCOUNTER — Ambulatory Visit: Payer: Medicare PPO

## 2023-01-09 NOTE — Progress Notes (Unsigned)
01/10/2023 9:37 AM   Edgar [name] January 24, 1946 [id number]  Referring provider: Danelle Berry, PA-C [address] [place],  [place] [address]  Urological history: 1. 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 BPH with LU TS -PSA (08/2022) 2.60 -mirabegron ER 50 mg daily   Chief Complaint  Patient presents with   Urinary Incontinence    HPI: Edgar [name] is a 77 y.o. male who presents today for for 3 month follow up.    I PSS 7/5  PVR 60  mL  He is a lot better now that he is taking the Myrbetriq 50 mg daily.  He still has issues with urge incontinence and that is why he stated he was unhappy with his urinary symptoms.  His urinary symptoms are not severe enough to cause interference with his daily life and they are not severe enough to consider workup for a possible bladder outlet procedure.   IPSS     Row Name 01/10/23 0900         International Prostate Symptom Score   How often have you had the sensation of not emptying your bladder? Not at All     How often have you had to urinate less than every two hours? Less than 1 in 5 times     How often have you found you stopped and started again several times when you urinated? About half the time     How often have you found it difficult to postpone urination? Less than 1 in 5 times     How often have you had a weak urinary stream? Not at All     How often have you had to strain to start urination? Not at All     How many times did you typically get up at night to urinate? 2 Times     Total IPSS Score 7       Quality of Life due to urinary symptoms   If you were to spend the rest of your life with your urinary condition just the way it is now how would you feel about that? Unhappy                Score:  1-7 Mild 8-19 Moderate 20-35 Severe    PMH: 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   Noted on CT scan Sept 2017   Aneurysm of thoracic aorta (HCC)  09/24/2017   Closed intertrochanteric fracture of left hip, sequela 05/30/2016   Coronary artery disease 10/25/2017   CT scan March 2019   Diabetes mellitus without complication (HCC)    Essential hypertension, benign 12/23/2015   History of colonoscopy with polypectomy 12/07/2016   Polypectomy x 12 per patient   Hx of pulmonary embolus 05/18/2016   Hospitalization Sept 2017, following hip fracture   Hx of tobacco use, presenting hazards to health 12/23/2015   Quit prior to 1997   Hx of tobacco use, presenting hazards to health 12/23/2015   Quit prior to 1997    Hyperlipidemia 12/23/2015   Hypertension    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   Obesity 12/23/2015   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   Pulmonary embolism (HCC) 05/18/2016   Hospitalization Sept 2017, following hip fracture   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   left ant lat thigh, SCCis in a SK, schedule EDC   Squamous cell carcinoma of skin 08/14/2021   L ant lat thigh,  EDC 09/07/21   Tinea pedis 06/25/2016    Surgical History: Past Surgical History:  Procedure Laterality Date   CIRCUMCISION N/A 03/26/2022   Procedure: CIRCUMCISION ADULT;  Surgeon: Vanna Scotland, MD;  Location: [facility] ORS;  Service: Urology;  Laterality: N/A;   COLONOSCOPY WITH PROPOFOL N/A 01/15/2017   Procedure: COLONOSCOPY WITH PROPOFOL;  Surgeon: Wyline Mood, MD;  Location: [facility] ENDOSCOPY;  Service: Endoscopy;  Laterality: N/A;   COLONOSCOPY WITH PROPOFOL N/A 12/17/2019   Procedure: COLONOSCOPY WITH PROPOFOL;  Surgeon: Wyline Mood, MD;  Location: [facility] ENDOSCOPY;  Service: Gastroenterology;  Laterality: N/A;   EYE SURGERY Bilateral    cataract surgery   FRACTURE SURGERY     INTRAMEDULLARY (IM) NAIL INTERTROCHANTERIC Left 04/14/2016   Procedure: INTRAMEDULLARY (IM) NAIL INTERTROCHANTRIC;  Surgeon: Christena Flake, MD;  Location: [facility] ORS;  Service: Orthopedics;  Laterality: Left;   JOINT REPLACEMENT Left  04/2016   TOTAL HIP ARTHROPLASTY      Home Medications:  Allergies as of 01/10/2023   No Known Allergies      Medication List        Accurate as of January 10, 2023  9:37 AM. If you have any questions, ask your nurse or doctor.          Accu-Chek Guide test strip Generic drug: glucose blood USE IN THE MORNING AT NOON AND AT BEDTIME E11.21   acetaminophen 500 MG tablet Commonly known as: TYLENOL Take 500 mg by mouth every 6 (six) hours as needed.   amLODipine 10 MG tablet Commonly known as: NORVASC Take 1 tablet (10 mg total) by mouth daily.   aspirin EC 81 MG tablet Take 81 mg by mouth daily. Swallow whole.   atorvastatin 40 MG tablet Commonly known as: LIPITOR TAKE 1 TABLET BY MOUTH EVERYDAY AT BEDTIME   B-D UF III MINI PEN NEEDLES 31G X 5 MM Misc Generic drug: Insulin Pen Needle Use to inject insulin four times daily   Basaglar KwikPen 100 UNIT/ML Inject 60-65 Units into the skin at bedtime. Patient receives via [employer] Patient Assistance through Dec 2024.   cholecalciferol 1000 units tablet Commonly known as: VITAMIN D Take 1,000 Units by mouth 2 (two) times daily.   Farxiga 10 MG Tabs tablet Generic drug: dapagliflozin propanediol TAKE 1 TABLET BY MOUTH EVERY DAY BEFORE BREAKFAST   insulin lispro 100 UNIT/ML KwikPen Commonly known as: HumaLOG KwikPen Inject 10 Units into the skin 3 (three) times daily. Patient receives via [employer] Patient Assistance through Dec 2024.   Lancets Misc Use as directed to monitor FSBS up to 4 x a day. Dx: E11.21 Z79.4   losartan 100 MG tablet Commonly known as: COZAAR Take 1 tablet (100 mg total) by mouth daily.   mirabegron ER 50 MG Tb24 tablet Commonly known as: MYRBETRIQ Take 1 tablet (50 mg total) by mouth daily.   naproxen 500 MG tablet Commonly known as: NAPROSYN Take 500 mg by mouth 2 (two) times daily.   OMEGA-3 FISH OIL PO Take by mouth.        Allergies: No Known Allergies  Family  History: Family History  Problem Relation Age of Onset   Cancer Mother    Diabetes Mother    Heart disease Father    Alcohol abuse Brother     Social History:  reports that he quit smoking about 44 years ago. His smoking use included cigarettes. He has never used smokeless tobacco. He reports that he does not drink alcohol and does not use  drugs.  ROS: Pertinent ROS in HPI  Physical Exam: BP 132/79   Pulse 65   Ht 6' (1.829 m)   Wt 276 lb (125.2 kg)   BMI 37.43 kg/m   Constitutional:  Well nourished. Alert and oriented, No acute distress. HEENT: [place] AT, moist mucus membranes.  Trachea midline Cardiovascular: No clubbing, cyanosis, or edema. Respiratory: Normal respiratory effort, no increased work of breathing. Neurologic: Grossly intact, no focal deficits, moving all 4 extremities. Psychiatric: Normal mood and affect.   Laboratory Data: Hemoglobin A1c on November 26, 2022 was 7.4 Serum creatinine on November 26, 2022 was 1.06 with a EGFR 73 I have reviewed the labs.   Pertinent Imaging:  01/10/23 09:03  Scan Result 60 ml    Assessment & Plan:    1. Urge incontinence -At goal with Myrbetriq 50 mg daily -He will continue the Myrbetriq 50 mg daily  Return in about 6 months (around 07/12/2023) for IPSS and PVR.  These notes generated with voice recognition software. I apologize for typographical errors.  Cloretta Ned  [facility] [address]  [address] [place], [place] [address] [phone]

## 2023-01-09 NOTE — Progress Notes (Unsigned)
Care Management & Coordination Services Pharmacy Note  01/09/2023 Name:  Edgar [name] MRN:  [id number] DOB:  12-06-1945  Summary: Patient presents for CCM consult.   -Still struggling with LUTS symptoms. Sees urology.   Recommendations/Changes made from today's visit: Continue current medications   Follow up plan: CPP follow-up 6 months   Subjective: Edgar [name] is an 77 y.o. year old male who is a primary patient of Danelle Berry, [place].  The care coordination team was consulted for assistance with disease management and care coordination needs.    Engaged with patient by telephone for follow up visit.  Patient Care Team: Danelle Berry, PA-C as PCP - General (Family Medicine) Gaspar Cola, [facility] as Pharmacist (Pharmacist) Helane Gunther, DPM as Consulting Physician (Podiatry) Orth[facility], [facility]  Recent office visits: 11/29/22: Patient presented to Danelle Berry, PA-C for follow-up.  11/26/22: Patient presented to Danelle Berry, PA-C for follow-up.  Recent consult visits: 12/25/22: Patient presented to Dr. Wyn Quaker (Vascular) for follow-up.  07/19/22: Patient presented to Dr. Stacie Acres (Podiatry)  05/01/22: Patient presented to Dr. Apolinar Junes (Urology)   Hospital visits: None in previous 6 months   Objective:  Lab Results  Component Value Date   CREATININE 1.06 11/26/2022   BUN 15 11/26/2022   EGFR 73 11/26/2022   GFRNONAA >60 03/22/2022   GFRAA 80 08/01/2020   NA 139 11/26/2022   K 5.0 11/26/2022   CALCIUM 10.3 11/26/2022   CO2 28 11/26/2022   GLUCOSE 129 (H) 11/26/2022    Lab Results  Component Value Date/Time   HGBA1C 7.4 (H) 11/26/2022 11:22 AM   HGBA1C 7.2 (H) 08/24/2022 11:59 AM   MICROALBUR 25.6 11/26/2022 11:22 AM   MICROALBUR 13.7 04/23/2022 10:41 AM    Last diabetic Eye exam:  Lab Results  Component Value Date/Time   HMDIABEYEEXA No Retinopathy 11/07/2022 12:00 AM    Last diabetic Foot exam: No results found for: "HMDIABFOOTEX"   Lab Results  Component  Value Date   CHOL 117 08/24/2022   HDL 37 (L) 08/24/2022   LDLCALC 66 08/24/2022   TRIG 60 08/24/2022   CHOLHDL 3.2 08/24/2022       Latest Ref Rng & Units 11/26/2022   11:22 AM 04/23/2022   10:41 AM 08/23/2021    1:47 PM  Hepatic Function  Total Protein 6.1 - 8.1 g/dL 7.7  7.9  7.2   AST 10 - 35 U/L 14  14  16    ALT 9 - 46 U/L 9  9  14    Total Bilirubin 0.2 - 1.2 mg/dL 0.7  0.6  0.5     Lab Results  Component Value Date/Time   TSH 2.71 08/26/2017 12:21 PM   TSH 3.00 12/07/2016 09:57 AM       Latest Ref Rng & Units 03/22/2022   11:33 AM 08/23/2021    1:47 PM 11/12/2020   11:42 AM  CBC  WBC 4.0 - 10.5 K/uL 7.2  7.1  5.9   Hemoglobin 13.0 - 17.0 g/dL 40.9  81.1  91.4   Hematocrit 39.0 - 52.0 % 40.7  47.9  45.8   Platelets 150 - 400 K/uL 247  257  255     Lab Results  Component Value Date/Time   VD25OH 52 08/26/2017 12:21 PM   VD25OH 45.9 01/06/2016 09:18 AM    Clinical ASCVD: No  The ASCVD Risk score (Arnett DK, et al., 2019) failed to calculate for the following reasons:   The valid total cholesterol range is 130  to 320 mg/dL       04/03/5620   30:86 AM 11/26/2022   10:39 AM 08/24/2022   11:09 AM  Depression screen PHQ 2/9  Decreased Interest 0 0 0  Down, Depressed, Hopeless 0 0 0  PHQ - 2 Score 0 0 0  Altered sleeping 0 0 0  Tired, decreased energy 0 0 0  Change in appetite 0 0 0  Feeling bad or failure about yourself  0 0 0  Trouble concentrating 0 0 0  Moving slowly or fidgety/restless 0 0 0  Suicidal thoughts 0 0 0  PHQ-9 Score 0 0 0  Difficult doing work/chores Not difficult at all Not difficult at all Not difficult at all    Social History   Tobacco Use  Smoking Status Former   Types: Cigarettes   Quit date: 09/01/1978   Years since quitting: 44.3  Smokeless Tobacco Never   BP Readings from Last 3 Encounters:  12/25/22 127/69  11/29/22 112/66  11/26/22 116/74   Pulse Readings from Last 3 Encounters:  12/25/22 66  11/29/22 83  11/26/22 91    Wt Readings from Last 3 Encounters:  12/25/22 275 lb (124.7 kg)  11/29/22 273 lb 11.2 oz (124.1 kg)  11/26/22 276 lb 1.6 oz (125.2 kg)   BMI Readings from Last 3 Encounters:  12/25/22 37.30 kg/m  11/29/22 37.12 kg/m  11/26/22 37.45 kg/m    No Known Allergies  Medications Reviewed Today     Reviewed by Annice Needy, MD (Physician) on 12/25/22 at 1211  Med List Status: <None>   Medication Order Taking? Sig Documenting Provider Last Dose Status Informant  acetaminophen (TYLENOL) 500 MG tablet [id number] Yes Take 500 mg by mouth every 6 (six) hours as needed. [provider] Taking Active Multiple Informants           Med Note Ann Maki, AMBER N   Fri Dec 07, 2016  9:14 AM) PRN  amLODipine (NORVASC) 10 MG tablet [id number] Yes Take 1 tablet (10 mg total) by mouth daily. Margarita Mail, DO Taking Active   aspirin EC 81 MG tablet [id number] Yes Take 81 mg by mouth daily. Swallow whole. [provider] Taking Active   atorvastatin (LIPITOR) 40 MG tablet [id number] Yes TAKE 1 TABLET BY MOUTH EVERYDAY AT BEDTIME Margarita Mail, DO Taking Active   cholecalciferol (VITAMIN D) 1000 units tablet [id number] Yes Take 1,000 Units by mouth 2 (two) times daily.  [provider] Taking Active Multiple Informants  dapagliflozin propanediol (FARXIGA) 10 MG TABS tablet [id number] Yes TAKE 1 TABLET BY MOUTH EVERY DAY BEFORE BREAKFAST Danelle Berry, PA-C Taking Active   glucose blood (ACCU-CHEK GUIDE) test strip [id number] Yes USE IN THE MORNING AT [facility] AND AT BEDTIME E11.21 Danelle Berry, PA-C Taking Active   Insulin Glargine (BASAGLAR KWIKPEN) 100 UNIT/ML [id number] Yes Inject 60-65 Units into the skin at bedtime. Patient receives via [employer] Patient Assistance through Dec 2024. Danelle Berry, PA-C Taking Active   insulin lispro (HUMALOG KWIKPEN) 100 UNIT/ML KwikPen [id number] Yes Inject 10 Units into the skin 3 (three) times daily. Patient receives via [employer] Patient  Assistance through Dec 2024. Margarita Mail, DO Taking Active   Insulin Pen Needle (B-D UF III MINI PEN NEEDLES) 31G X 5 MM MISC [id number] Yes Use to inject insulin four times daily Danelle Berry, PA-C Taking Active   Lancets MISC [id number] Yes Use as directed to monitor FSBS up to 4 x a day. Dx: E11.21 Z79.4 Danelle Berry, PA-C Taking  Active   losartan (COZAAR) 100 MG tablet [id number] Yes Take 1 tablet (100 mg total) by mouth daily. Margarita Mail, DO Taking Active   mirabegron ER (MYRBETRIQ) 50 MG TB24 tablet [id number] Yes Take 1 tablet (50 mg total) by mouth daily. Michiel Cowboy A, PA-C Taking Active   naproxen (NAPROSYN) 500 MG tablet [id number] Yes Take 500 mg by mouth 2 (two) times daily. [provider] Taking Active   Omega-3 Fatty Acids (OMEGA-3 FISH OIL PO) [id number] Yes Take by mouth. [provider] Taking Active             Patient Active Problem List   Diagnosis Date Noted   Lower urinary tract symptoms (LUTS) 11/29/2022   Urine test positive for microalbuminuria 11/29/2022   Hypertension associated with type 2 diabetes mellitus (HCC) 12/21/2021   Acquired trigger finger of right index finger 12/30/2020   Aortic atherosclerosis (HCC) 08/01/2020   Coronary artery disease 10/25/2017   Aneurysm of thoracic aorta (HCC) 09/24/2017   History of colonoscopy with polypectomy 12/07/2016   Fatty liver 09/11/2016   Essential hypertension, benign 12/23/2015   Mixed hyperlipidemia 12/23/2015   Morbid obesity (HCC) 12/23/2015   Type 2 diabetes mellitus with diabetic nephropathy, 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HCC) 01/25/2015    Immunization History  Administered Date(s) Administered   Fluad Quad(high Dose 65+) 04/14/2019, 04/29/2020, 04/23/2022   Influenza, High Dose Seasonal PF 05/30/2015, 04/30/2017, 05/29/2018, 04/24/2021   Influenza-Unspecified 04/25/2016   Moderna Sars-Covid-2 Vaccination 09/18/2019, 10/16/2019, 07/05/2020   PNEUMOCOCCAL  CONJUGATE-20 08/24/2022   Pneumococcal Conjugate-13 10/08/2013   Pneumococcal Polysaccharide-23 08/26/2017   Pneumococcal-Unspecified 04/17/2010   Td 05/13/2009   Zoster, Live 10/11/2013     Compliance/Adherence/Medication fill history: Care Gaps: Shingrix Tdap  Covid   Star-Rating Drugs: Atorvastatin 40 mg last filled 05/29/22 for 90-DS Losartan 100 mg last filled 06/04/22 for 90-DS   SDOH:  (Social Determinants of Health) assessments and interventions performed: Yes SDOH Interventions    Flowsheet Row Clinical Support from 01/09/2022 in [facility] Chronic Care Management from 09/20/2021 in [facility] Chronic Care Management from 03/22/2021 in [facility] Clinical Support from 01/05/2021 in [facility] Chronic Care Management from 12/21/2020 in [facility] Chronic Care Management from 10/26/2020 in [facility]  SDOH Interventions        Food Insecurity Interventions Intervention Not Indicated -- -- -- -- --  Housing Interventions Intervention Not Indicated -- -- -- -- --  Transportation Interventions Intervention Not Indicated -- -- -- -- --  Financial Strain Interventions Intervention Not Indicated Intervention Not Indicated Intervention Not Indicated Intervention Not Indicated  [pt receiving assistance for insulin] Intervention Not Indicated Other (Comment)  [PAP]  Physical Activity Interventions Intervention Not Indicated -- -- -- -- --  Stress Interventions Intervention Not Indicated -- -- -- -- --  Social Connections Interventions Intervention Not Indicated -- -- -- -- --      SDOH Screenings   Food Insecurity: No Food Insecurity (01/09/2022)  Housing: Low Risk  (01/09/2022)  Transportation Needs: No Transportation Needs (01/09/2022)  Alcohol Screen: Low Risk  (01/09/2022)  Depression (PHQ2-9): Low Risk  (11/29/2022)   Financial Resource Strain: Low Risk  (01/09/2022)  Physical Activity: Sufficiently Active (01/09/2022)  Social Connections: Moderately Isolated (01/09/2022)  Stress: No Stress Concern Present (01/09/2022)  Tobacco Use: Medium Risk (11/29/2022)    Medication Assistance:  Basaglar, Humalog obtained through [employer] medication  assistance program.  Enrollment ends Dec 2024  Medication Access: Within the past 30 days, how often has patient missed a dose of medication? No Is a pillbox or other method used to improve adherence? Yes  Factors that may affect medication adherence? no barriers identified Are meds synced by current pharmacy? No  Are meds delivered by current pharmacy? No  Does patient experience delays in picking up medications due to transportation concerns? No   Upstream Services Reviewed: Is patient disadvantaged to use [facility] Pharmacy?: No  Current Rx insurance plan: [employer] Name and location of Current pharmacy:  [id number] [place], [place] - [address] [place] [address] Phone: [phone] Fax: [phone]  [facility] - [place], [place] - [address] [place] [address] Phone: [phone] Fax: [phone]  [facility] Pharmacy services reviewed with patient today?: No  Patient requests to transfer care to [facility] Pharmacy?: No  Reason patient declined to change pharmacies: Resistance to change   Assessment/Plan  Current Barriers:  Unable to independently afford treatment regimen  Pharmacist Clinical Goal(s):  Over the next 90 days, patient will verbalize ability to afford treatment regimen maintain control of diabetes as evidenced by A1c less than 8%  through collaboration with PharmD and provider.   Interventions: 1:1 collaboration with Danelle Berry, PA-C regarding development and update of comprehensive plan of care as evidenced by provider attestation and co-signature Inter-disciplinary care team  collaboration (see longitudinal plan of care) Comprehensive medication review performed; medication list updated in electronic medical record  Hypertension (BP goal <140/90) -Controlled -Current treatment: Amlodipine 10 mg daily: Appropriate, Effective, Safe, Accessible Losartan 100 mg daily: Appropriate, Effective, Safe, Accessible  -Medications previously tried: NA  -Current home readings: NA -Denies hypotensive/hypertensive symptoms -Educated on Importance of home blood pressure monitoring; -Counseled to monitor BP at home weekly, document, and provide log at future appointments -Recommended to continue current medication  Hyperlipidemia: (LDL goal < 70) -Controlled -Current treatment: Atorvastatin 40 mg daily  -Medications previously tried: NA  -Educated on Importance of limiting foods high in cholesterol; -Recommended to continue current medication  Diabetes (A1c goal <8%) -Controlled -Current medications: Farxiga 10 mg daily : Appropriate, Effective, Safe, Accessible  Humalog 10 units three times daily: Appropriate, Effective, Safe, Accessible  Basaglar 65 units nightly (0.47 u/kg): Appropriate, Effective, Safe, Accessible -Medications previously tried: NA  -Current home glucose readings:   AM PM   5-Jun 149   4-Jun 135 186  3-Jun 152 122  2-Jun 126 172  1-Jun 115 173  31-May 137 94  30-May 134 154  29-May 163 158  28-May 140 145  27-May 131 147  26-May 144 135  25-May 138 121  24-May 157 177  23-May 159 147  22-May 112 116  21-May 86 162      Average 136 147   -Denies hypoglycemic/hyperglycemic symptoms.  -Recommend continuing current medications.  Cheyenne Adas, CPP Clinical Pharmacist Practitioner  [facility] [phone]

## 2023-01-10 ENCOUNTER — Encounter: Payer: Self-pay | Admitting: Urology

## 2023-01-10 ENCOUNTER — Ambulatory Visit: Payer: Medicare PPO | Admitting: Urology

## 2023-01-10 VITALS — BP 132/79 | HR 65 | Ht 72.0 in | Wt 276.0 lb

## 2023-01-10 DIAGNOSIS — N3941 Urge incontinence: Secondary | ICD-10-CM

## 2023-01-10 LAB — BLADDER SCAN AMB NON-IMAGING: Scan Result: 60

## 2023-01-24 ENCOUNTER — Ambulatory Visit (INDEPENDENT_AMBULATORY_CARE_PROVIDER_SITE_OTHER): Payer: Medicare PPO | Admitting: Nurse Practitioner

## 2023-01-31 ENCOUNTER — Ambulatory Visit: Payer: Medicare PPO | Admitting: Podiatry

## 2023-02-04 ENCOUNTER — Encounter: Payer: Self-pay | Admitting: Podiatry

## 2023-02-04 ENCOUNTER — Ambulatory Visit: Payer: Medicare PPO | Admitting: Podiatry

## 2023-02-04 VITALS — BP 127/73

## 2023-02-04 DIAGNOSIS — E1121 Type 2 diabetes mellitus with diabetic nephropathy: Secondary | ICD-10-CM

## 2023-02-04 DIAGNOSIS — Z794 Long term (current) use of insulin: Secondary | ICD-10-CM

## 2023-02-04 DIAGNOSIS — M79676 Pain in unspecified toe(s): Secondary | ICD-10-CM

## 2023-02-04 DIAGNOSIS — B351 Tinea unguium: Secondary | ICD-10-CM | POA: Diagnosis not present

## 2023-02-04 NOTE — Progress Notes (Signed)
  Subjective:  Patient ID: Edgar [name], male    DOB: 1946-05-22,  MRN: [id number]  WAYNE SIEMON presents to clinic today for: preventative diabetic foot care and painful elongated mycotic toenails 1-5 bilaterally which are tender when wearing enclosed shoe gear. Pain is relieved with periodic professional debridement.  Chief Complaint  Patient presents with   Nail Problem    DFC,Referring Provider Danelle Berry, PA-C,lov:04/24,A1C:7.4,BS:167      PCP is Danelle Berry, PA-C.  No Known Allergies  Review of Systems: Negative except as noted in the HPI.  Objective: No changes noted in today's physical examination. Vitals:   02/04/23 1032  BP: 127/73   Edgar [name] is a pleasant 77 y.o. male, obese in NAD. AAO x 3.  Vascular Examination: Capillary refill time <3 seconds b/l LE. Palpable pedal pulses b/l LE. Digital hair absent b/l. No pedal edema b/l. Skin temperature gradient WNL b/l. No varicosities b/l.  Dermatological Examination: Pedal skin with normal turgor, texture and tone b/l. No open wounds. No interdigital macerations b/l. Toenails 1-5 b/l thickened, discolored, dystrophic with subungual debris. There is pain on palpation to dorsal aspect of nailplates. No hyperkeratotic nor porokeratotic lesions present on today's visit.  Neurological Examination: Protective sensation intact with 10 gram monofilament b/l LE. Vibratory sensation intact b/l LE.   Musculoskeletal Examination: Normal muscle strength 5/5 to all lower extremity muscle groups bilaterally. HAV with bunion deformity noted b/l LE.Marland Kitchen No pain, crepitus or joint limitation noted with ROM b/l LE.  Patient ambulates independently without assistive aids.     Latest Ref Rng & Units 11/26/2022   11:22 AM 08/24/2022   11:59 AM 04/23/2022   10:41 AM  Hemoglobin A1C  Hemoglobin-A1c <5.7 % of total Hgb 7.4  7.2  7.2    Assessment/Plan: 1. Pain due to onychomycosis of toenail   2. Type 2 diabetes mellitus with diabetic  nephropathy, 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HCC)     -Patient was evaluated and treated. All patient's and/or POA's questions/concerns answered on today's visit. -Continue foot and shoe inspections daily. Monitor blood glucose per PCP/Endocrinologist's recommendations. -Patient to continue soft, supportive shoe gear daily. -Toenails 1-5 b/l were debrided in length and girth with sterile nail nippers and dremel without iatrogenic bleeding.  -Patient/POA to call should there be question/concern in the interim.   Return in about 3 months (around 05/07/2023).  Freddie Breech, DPM

## 2023-02-16 ENCOUNTER — Other Ambulatory Visit: Payer: Self-pay | Admitting: Internal Medicine

## 2023-02-16 DIAGNOSIS — I10 Essential (primary) hypertension: Secondary | ICD-10-CM

## 2023-02-18 NOTE — Telephone Encounter (Signed)
Requested Prescriptions  Pending Prescriptions Disp Refills   losartan (COZAAR) 100 MG tablet [Pharmacy Med Name: LOSARTAN POTASSIUM 100 MG TAB] 90 tablet 1    Sig: TAKE 1 TABLET BY MOUTH EVERY DAY     Cardiovascular:  Angiotensin Receptor Blockers Passed - 02/16/2023  9:07 AM      Passed - Cr in normal range and within 180 days    Creat  Date Value Ref Range Status  11/26/2022 1.06 0.70 - 1.28 mg/dL Final   Creatinine, Urine  Date Value Ref Range Status  11/26/2022 103 20 - 320 mg/dL Final         Passed - K in normal range and within 180 days    Potassium  Date Value Ref Range Status  11/26/2022 5.0 3.5 - 5.3 mmol/L Final         Passed - Patient is not pregnant      Passed - Last BP in normal range    BP Readings from Last 1 Encounters:  02/04/23 127/73         Passed - Valid encounter within last 6 months    Recent Outpatient Visits           2 months ago Type 2 diabetes mellitus with diabetic nephropathy, 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HCC)   [place] [facility] Danelle Berry, PA-C   2 months ago Type 2 diabetes mellitus with diabetic nephropathy, with long-term current use of insulin [facility])   [place] [facility] Danelle Berry, PA-C   5 months ago Type 2 diabetes mellitus with diabetic nephropathy, with long-term current use of insulin [facility])   [place] [facility] Margarita Mail, DO   10 months ago Hypertension associated with type 2 diabetes mellitus [facility])    [facility] Danelle Berry, PA-C   1 year ago Hypertension associated with type 2 diabetes mellitus [facility])   [facility] Berniece Salines, FNP       Future Appointments             In 2 weeks Danelle Berry, PA-C [facility], [facility]   In 4 months McGowan, Elana Alm [facility] [place]

## 2023-02-25 ENCOUNTER — Ambulatory Visit: Payer: Medicare PPO | Admitting: Family Medicine

## 2023-03-01 NOTE — Addendum Note (Signed)
Addended by: Danelle Berry on: 03/01/2023 05:12 PM   Modules accepted: Level of Service

## 2023-03-06 ENCOUNTER — Ambulatory Visit: Payer: Medicare PPO | Admitting: Family Medicine

## 2023-03-11 ENCOUNTER — Other Ambulatory Visit: Payer: Self-pay | Admitting: Family Medicine

## 2023-03-11 DIAGNOSIS — Z794 Long term (current) use of insulin: Secondary | ICD-10-CM

## 2023-03-11 NOTE — Telephone Encounter (Signed)
Medication Refill - Medication:  insulin lispro (HUMALOG KWIKPEN) 100 UNIT/ML KwikPen  Almost out  Has the patient contacted their pharmacy? Yes.  Advised to contact PCP  Preferred Pharmacy (with phone number or street name):   [address] [facility] [place] - [address] [place] [[id number]] [address] Pharmacy #: [phone]   Has the patient been seen for an appointment in the last year OR does the patient have an upcoming appointment? Yes. F/U on 03/13/2023 with PCP

## 2023-03-12 MED ORDER — INSULIN LISPRO (1 UNIT DIAL) 100 UNIT/ML (KWIKPEN): 10.0000 [IU] | PEN_INJECTOR | Freq: Three times a day (TID) | SUBCUTANEOUS | 1 refills | Status: DC

## 2023-03-12 NOTE — Telephone Encounter (Signed)
Requested Prescriptions  Pending Prescriptions Disp Refills   insulin lispro (HUMALOG KWIKPEN) 100 UNIT/ML KwikPen 15 mL 1    Sig: Inject 10 Units into the skin 3 (three) times daily. Patient receives via [employer] Patient Assistance through Dec 2024.     Endocrinology:  Diabetes - Insulins Passed - 03/11/2023  1:20 PM      Passed - HBA1C is between 0 and 7.9 and within 180 days    Hgb A1c MFr Bld  Date Value Ref Range Status  11/26/2022 7.4 (H) <5.7 % of total Hgb Final    Comment:    For someone without known diabetes, a hemoglobin A1c value of 6.5% or greater indicates that they may have  diabetes and this should be confirmed with a follow-up  test. . For someone with known diabetes, a value <7% indicates  that their diabetes is well controlled and a value  greater than or equal to 7% indicates suboptimal  control. A1c targets should be individualized based on  duration of diabetes, age, comorbid conditions, and  other considerations. . Currently, no consensus exists regarding use of hemoglobin A1c for diagnosis of diabetes for children. Verna Czech - Valid encounter within last 6 months    Recent Outpatient Visits           3 months ago Type 2 diabetes mellitus with diabetic nephropathy, with long-term current use of insulin [facility])   [place] [facility] Danelle Berry, PA-C   3 months ago Type 2 diabetes mellitus with diabetic nephropathy, with long-term current use of insulin [facility])   [place] [facility] Danelle Berry, PA-C   6 months ago Type 2 diabetes mellitus with diabetic nephropathy, with long-term current use of insulin [facility])   [place] [facility] Margarita Mail, DO   10 months ago Hypertension associated with type 2 diabetes mellitus [facility])   [place] [facility] Danelle Berry, PA-C   1 year ago Hypertension associated with type 2 diabetes mellitus [facility])   [facility] Berniece Salines, FNP       Future Appointments             Tomorrow Danelle Berry, PA-C [place] [facility], [facility]   In 4 months McGowan, Elana Alm [facility] [place]

## 2023-03-13 ENCOUNTER — Encounter: Payer: Self-pay | Admitting: Family Medicine

## 2023-03-13 ENCOUNTER — Ambulatory Visit: Payer: Medicare PPO | Admitting: Family Medicine

## 2023-03-13 VITALS — BP 132/76 | HR 79 | Temp 97.5°F | Resp 16 | Ht 72.0 in | Wt 274.7 lb

## 2023-03-13 DIAGNOSIS — I152 Hypertension secondary to endocrine disorders: Secondary | ICD-10-CM | POA: Diagnosis not present

## 2023-03-13 DIAGNOSIS — I10 Essential (primary) hypertension: Secondary | ICD-10-CM

## 2023-03-13 DIAGNOSIS — E1159 Type 2 diabetes mellitus with other circulatory complications: Secondary | ICD-10-CM | POA: Diagnosis not present

## 2023-03-13 DIAGNOSIS — E1121 Type 2 diabetes mellitus with diabetic nephropathy: Secondary | ICD-10-CM

## 2023-03-13 DIAGNOSIS — R809 Proteinuria, unspecified: Secondary | ICD-10-CM | POA: Diagnosis not present

## 2023-03-13 DIAGNOSIS — Z794 Long term (current) use of insulin: Secondary | ICD-10-CM | POA: Diagnosis not present

## 2023-03-13 DIAGNOSIS — Z5181 Encounter for therapeutic drug level monitoring: Secondary | ICD-10-CM

## 2023-03-13 LAB — COMPLETE METABOLIC PANEL WITH GFR
AG Ratio: 1.2 (calc) (ref 1.0–2.5)
ALT: 10 U/L (ref 9–46)
AST: 12 U/L (ref 10–35)
Albumin: 3.9 g/dL (ref 3.6–5.1)
Alkaline phosphatase (APISO): 72 U/L (ref 35–144)
BUN: 13 mg/dL (ref 7–25)
CO2: 27 mmol/L (ref 20–32)
Calcium: 10 mg/dL (ref 8.6–10.3)
Chloride: 109 mmol/L (ref 98–110)
Creat: 1.11 mg/dL (ref 0.70–1.28)
Globulin: 3.3 g/dL (calc) (ref 1.9–3.7)
Glucose, Bld: 113 mg/dL — ABNORMAL HIGH (ref 65–99)
Potassium: 4.7 mmol/L (ref 3.5–5.3)
Sodium: 141 mmol/L (ref 135–146)
Total Bilirubin: 0.6 mg/dL (ref 0.2–1.2)
Total Protein: 7.2 g/dL (ref 6.1–8.1)
eGFR: 69 mL/min/{1.73_m2} (ref >=60)

## 2023-03-13 MED ORDER — DAPAGLIFLOZIN PROPANEDIOL 10 MG PO TABS
10.0000 mg | ORAL_TABLET | Freq: Every morning | ORAL | 1 refills | Status: DC
Start: 2023-03-13 — End: 2023-06-18

## 2023-03-13 MED ORDER — BD PEN NEEDLE MINI U/F 31G X 5 MM MISC
11 refills | Status: DC
Start: 2023-03-13 — End: 2023-08-22

## 2023-03-13 NOTE — Assessment & Plan Note (Signed)
BP improved with recheck today, near goal Continue losartan and norvasc

## 2023-03-13 NOTE — Addendum Note (Signed)
Addended by: Danelle Berry on: 03/13/2023 04:23 PM   Modules accepted: Level of Service

## 2023-03-13 NOTE — Assessment & Plan Note (Signed)
On insulin and farxiga (cost an issue) Due for recheck of labs A1C has been near goal and acceptable for age - would like to avoid hypoglycemia Recent pertinent labs: Lab Results  Component Value Date   HGBA1C 7.4 (H) 11/26/2022   HGBA1C 7.2 (H) 08/24/2022   HGBA1C 7.2 (H) 04/23/2022

## 2023-03-13 NOTE — Progress Notes (Addendum)
Name: Edgar [name]   MRN: [id number]    DOB: 30-Jun-1946   Date:03/13/2023       Progress Note  Chief Complaint  Patient presents with   Follow-up   Diabetes   Hypertension     Subjective:   Edgar [name] is a 77 y.o. male, presents to clinic for DM f/up  DM:   Pt managing DM with basal and meatime insulin and farxiga (cost an issue) Denies: Polyuria, polydipsia, vision changes, neuropathy, hypoglycemia Recent pertinent labs: Lab Results  Component Value Date   HGBA1C 7.4 (H) 11/26/2022   HGBA1C 7.2 (H) 08/24/2022   HGBA1C 7.2 (H) 04/23/2022   Lab Results  Component Value Date   MICROALBUR 25.6 11/26/2022   LDLCALC 66 08/24/2022   CREATININE 1.06 11/26/2022   Standard of care and health maintenance: Urine Microalbumin:  Positive repeat today Foot exam:  UTD DM eye exam:  UTD ACEI/ARB:  yes Statin:  yes  BP high today- he hasn't taken any of his meds and he hasn't eaten yet today BP Readings from Last 3 Encounters:  03/13/23 132/76  02/04/23 127/73  01/10/23 132/79  BP was better after recheck  He is on losartan and norvasc      Current Outpatient Medications:    acetaminophen (TYLENOL) 500 MG tablet, Take 500 mg by mouth every 6 (six) hours as needed., Disp: , Rfl:    amLODipine (NORVASC) 10 MG tablet, Take 1 tablet (10 mg total) by mouth daily., Disp: 90 tablet, Rfl: 3   aspirin EC 81 MG tablet, Take 81 mg by mouth daily. Swallow whole., Disp: , Rfl:    atorvastatin (LIPITOR) 40 MG tablet, TAKE 1 TABLET BY MOUTH EVERYDAY AT BEDTIME, Disp: 90 tablet, Rfl: 3   cholecalciferol (VITAMIN D) 1000 units tablet, Take 1,000 Units by mouth 2 (two) times daily. , Disp: , Rfl:    dapagliflozin propanediol (FARXIGA) 10 MG TABS tablet, TAKE 1 TABLET BY MOUTH EVERY DAY BEFORE BREAKFAST, Disp: 90 tablet, Rfl: 1   glucose blood (ACCU-CHEK GUIDE) test strip, USE IN THE MORNING AT NOON AND AT BEDTIME E11.21, Disp: 100 strip, Rfl: 6   Insulin Glargine (BASAGLAR KWIKPEN) 100  UNIT/ML, Inject 60-65 Units into the skin at bedtime. Patient receives via [employer] Patient Assistance through Dec 2024., Disp: , Rfl:    insulin lispro (HUMALOG KWIKPEN) 100 UNIT/ML KwikPen, Inject 10 Units into the skin 3 (three) times daily. Patient receives via [employer] Patient Assistance through Dec 2024., Disp: 15 mL, Rfl: 1   Insulin Pen Needle (B-D UF III MINI PEN NEEDLES) 31G X 5 MM MISC, Use to inject insulin four times daily, Disp: 200 each, Rfl: 11   Lancets MISC, Use as directed to monitor FSBS up to 4 x a day. Dx: E11.21 Z79.4, Disp: 100 each, Rfl: 11   losartan (COZAAR) 100 MG tablet, TAKE 1 TABLET BY MOUTH EVERY DAY, Disp: 90 tablet, Rfl: 1   mirabegron ER (MYRBETRIQ) 50 MG TB24 tablet, Take 1 tablet (50 mg total) by mouth daily., Disp: 90 tablet, Rfl: 3   naproxen (NAPROSYN) 500 MG tablet, Take 500 mg by mouth 2 (two) times daily., Disp: , Rfl:    Omega-3 Fatty Acids (OMEGA-3 FISH OIL PO), Take by mouth., Disp: , Rfl:   Patient Active Problem List   Diagnosis Date Noted   Lower urinary tract symptoms (LUTS) 11/29/2022   Urine test positive for microalbuminuria 11/29/2022   Hypertension associated with type 2 diabetes mellitus (HCC)  12/21/2021   Acquired trigger finger of right index finger 12/30/2020   Aortic atherosclerosis (HCC) 08/01/2020   Coronary artery disease 10/25/2017   Aneurysm of thoracic aorta (HCC) 09/24/2017   History of colonoscopy with polypectomy 12/07/2016   Fatty liver 09/11/2016   Essential hypertension, benign 12/23/2015   Mixed hyperlipidemia 12/23/2015   Morbid obesity (HCC) 12/23/2015   Type 2 diabetes mellitus with diabetic nephropathy, 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HCC) 01/25/2015    Past Surgical History:  Procedure Laterality Date   CIRCUMCISION N/A 03/26/2022   Procedure: CIRCUMCISION ADULT;  Surgeon: Vanna Scotland, MD;  Location: [facility] ORS;  Service: Urology;  Laterality: N/A;   COLONOSCOPY WITH PROPOFOL N/A 01/15/2017    Procedure: COLONOSCOPY WITH PROPOFOL;  Surgeon: Wyline Mood, MD;  Location: [facility] ENDOSCOPY;  Service: Endoscopy;  Laterality: N/A;   COLONOSCOPY WITH PROPOFOL N/A 12/17/2019   Procedure: COLONOSCOPY WITH PROPOFOL;  Surgeon: Wyline Mood, MD;  Location[facility] ENDOSCOPY;  Service: Gastroenterology;  Laterality: N/A;   EYE SURGERY Bilateral    cataract surgery   FRACTURE SURGERY     INTRAMEDULLARY (IM) NAIL INTERTROCHANTERIC Left 04/14/2016   Procedure: INTRAMEDULLARY (IM) NAIL INTERTROCHANTRIC;  Surgeon: Christena Flake, MD;  Location: [facility] ORS;  Service: Orthopedics;  Laterality: Left;   JOINT REPLACEMENT Left 04/2016   TOTAL HIP ARTHROPLASTY      Family History  Problem Relation Age of Onset   Cancer Mother    Diabetes Mother    Heart disease Father    Alcohol abuse Brother     Social History   Tobacco Use   Smoking status: Former    Current packs/day: 0.00    Types: Cigarettes    Quit date: 09/01/1978    Years since quitting: 44.5   Smokeless tobacco: Never  Vaping Use   Vaping status: Never Used  Substance Use Topics   Alcohol use: No    Alcohol/week: 0.0 standard drinks of alcohol   Drug use: No     No Known Allergies  Health Maintenance  Topic Date Due   DTaP/Tdap/Td (2 - Tdap) 05/14/2019   Medicare Annual Wellness (AWV)  01/10/2023   INFLUENZA VACCINE  03/07/2023   COVID-19 Vaccine (4 - 2023-24 season) 03/29/2023 (Originally 04/06/2022)   Zoster Vaccines- Shingrix (1 of 2) 06/13/2023 (Originally 01/19/1966)   HEMOGLOBIN A1C  05/28/2023   FOOT EXAM  08/25/2023   OPHTHALMOLOGY EXAM  11/07/2023   Diabetic kidney evaluation - eGFR measurement  11/26/2023   Diabetic kidney evaluation - Urine ACR  11/26/2023   Colonoscopy  12/17/2024   Pneumonia Vaccine 28+ Years old  Completed   Hepatitis C Screening  Completed   HPV VACCINES  Aged Out    Chart Review Today: I personally reviewed active problem list, medication list, allergies, family history, social history, health  maintenance, notes from last encounter, lab results, imaging with the patient/caregiver today.   Review of Systems  Constitutional: Negative.   HENT: Negative.    Eyes: Negative.   Respiratory: Negative.    Cardiovascular: Negative.   Gastrointestinal: Negative.   Endocrine: Negative.   Genitourinary: Negative.   Musculoskeletal: Negative.   Skin: Negative.   Allergic/Immunologic: Negative.   Neurological: Negative.   Hematological: Negative.   Psychiatric/Behavioral: Negative.    All other systems reviewed and are negative.    Objective:   Vitals:   03/13/23 1031 03/13/23 1048  BP: (!) 152/88 132/76  Pulse: 79   Resp: 16   Temp: (!) 97.5 F (36.4 C)  TempSrc: Oral   SpO2: 93%   Weight: 274 lb 11.2 oz (124.6 kg)   Height: 6' (1.829 m)     Body mass index is 37.26 kg/m.  Physical Exam Vitals and nursing note reviewed.  Constitutional:      Appearance: He is well-developed. He is obese.  HENT:     Head: Normocephalic and atraumatic.     Nose: Nose normal.  Eyes:     General:        Right eye: No discharge.        Left eye: No discharge.     Conjunctiva/sclera: Conjunctivae normal.  Neck:     Trachea: No tracheal deviation.  Cardiovascular:     Rate and Rhythm: Normal rate and regular rhythm.     Pulses: Normal pulses.     Heart sounds: Normal heart sounds.  Pulmonary:     Effort: Pulmonary effort is normal. No respiratory distress.     Breath sounds: Normal breath sounds. No stridor.  Musculoskeletal:        General: Normal range of motion.  Skin:    General: Skin is warm and dry.     Findings: No rash.  Neurological:     Mental Status: He is alert.     Motor: No abnormal muscle tone.     Coordination: Coordination normal.  Psychiatric:        Behavior: Behavior normal.         Assessment & Plan:   Problem List Items Addressed This Visit       Cardiovascular and Mediastinum   Essential hypertension, benign (Chronic)    BP improved  with recheck today, near goal Continue losartan and norvasc      Hypertension associated with type 2 diabetes mellitus (HCC)    No losartan and norvasc BP Readings from Last 3 Encounters:  02/04/23 127/73  01/10/23 132/79  12/25/22 127/69         Relevant Medications   dapagliflozin propanediol (FARXIGA) 10 MG TABS tablet     Endocrine   Type 2 diabetes mellitus with diabetic nephropathy, 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HCC) - Primary    On insulin and farxiga (cost an issue) Due for recheck of labs A1C has been near goal and acceptable for age - would like to avoid hypoglycemia Recent pertinent labs: Lab Results  Component Value Date   HGBA1C 7.4 (H) 11/26/2022   HGBA1C 7.2 (H) 08/24/2022   HGBA1C 7.2 (H) 04/23/2022         Relevant Medications   dapagliflozin propanediol (FARXIGA) 10 MG TABS tablet   Insulin Pen Needle (B-D UF III MINI PEN NEEDLES) 31G X 5 MM MISC   Other Relevant Orders   Microalbumin / creatinine urine ratio   COMPLETE METABOLIC PANEL WITH GFR   Hemoglobin A1c     Other   Urine test positive for microalbuminuria    On farxiga - he has full bottle with him, encouraged him to keep taking once daily in am Asked him that he please notify us if unable to get or afford Recheck uacr today       Relevant Orders   Microalbumin / creatinine urine ratio   COMPLETE METABOLIC PANEL WITH GFR   Hemoglobin A1c   Other Visit Diagnoses     Encounter for medication monitoring       Relevant Orders   Microalbumin / creatinine urine ratio   COMPLETE METABOLIC PANEL WITH GFR   Hemoglobin A1c  Pt wished for me to review his glucose log/notebook:        Return for needs MWV, routie f/up 4 months.   Danelle Berry, PA-C 03/13/23 10:43 AM

## 2023-03-13 NOTE — Assessment & Plan Note (Signed)
On farxiga - he has full bottle with him, encouraged him to keep taking once daily in am Asked him that he please notify us if unable to get or afford Recheck uacr today

## 2023-03-13 NOTE — Assessment & Plan Note (Signed)
No losartan and norvasc BP Readings from Last 3 Encounters:  02/04/23 127/73  01/10/23 132/79  12/25/22 127/69

## 2023-04-12 ENCOUNTER — Ambulatory Visit (INDEPENDENT_AMBULATORY_CARE_PROVIDER_SITE_OTHER): Payer: Medicare PPO

## 2023-04-12 VITALS — Ht 72.0 in | Wt 274.0 lb

## 2023-04-12 DIAGNOSIS — Z0000 Encounter for general adult medical examination without abnormal findings: Secondary | ICD-10-CM

## 2023-04-12 NOTE — Progress Notes (Signed)
Subjective:   Edgar [name] is a 77 y.o. male who presents for Medicare Annual/Subsequent preventive examination.  Visit Complete: Virtual  I connected with  Edgar [name] on 04/12/23 by a audio enabled telemedicine application and verified that I am speaking with the correct person using two identifiers.  Patient Location: Home  Provider Location: Office/Clinic  I discussed the limitations of evaluation and management by telemedicine. The patient expressed understanding and agreed to proceed.  Vital Signs: Unable to obtain new vitals due to this being a telehealth visit.  Patient Medicare AWV questionnaire was completed by the patient on (not done); I have confirmed that all information answered by patient is correct and no changes since this date.  Review of Systems    Cardiac Risk Factors include: advanced age (>42men, >73 women);diabetes mellitus;dyslipidemia;male gender;hypertension;obesity (BMI >30kg/m2)    Objective:    Today's Vitals   04/12/23 0939  Weight: 274 lb (124.3 kg)  Height: 6' (1.829 m)   Body mass index is 37.16 kg/m.     04/12/2023    9:51 AM 03/26/2022   11:50 AM 03/22/2022    9:39 AM 01/09/2022    9:09 AM 01/05/2021    9:07 AM 11/12/2020   10:56 AM 01/02/2020    2:00 PM  Advanced Directives  Does Patient Have a Medical Advance Directive? Yes Yes Yes Yes No No No  Type of Estate agent of [place];Living will Healthcare Power of [employer] of [place];Living will Healthcare Power of [place];Living will     Does patient want to make changes to medical advance directive?  No - Patient declined       Copy of Healthcare Power of Attorney in Chart?  No - copy requested  No - copy requested     Would patient like information on creating a medical advance directive?     Yes (MAU/Ambulatory/Procedural Areas - Information given)  No - Patient declined    Current Medications (verified) Outpatient Encounter Medications as of  04/12/2023  Medication Sig   acetaminophen (TYLENOL) 500 MG tablet Take 500 mg by mouth every 6 (six) hours as needed.   amLODipine (NORVASC) 10 MG tablet Take 1 tablet (10 mg total) by mouth daily.   aspirin EC 81 MG tablet Take 81 mg by mouth daily. Swallow whole.   atorvastatin (LIPITOR) 40 MG tablet TAKE 1 TABLET BY MOUTH EVERYDAY AT BEDTIME   cholecalciferol (VITAMIN D) 1000 units tablet Take 1,000 Units by mouth 2 (two) times daily.    dapagliflozin propanediol (FARXIGA) 10 MG TABS tablet Take 1 tablet (10 mg total) by mouth in the morning.   glucose blood (ACCU-CHEK GUIDE) test strip USE IN THE MORNING AT NOON AND AT BEDTIME E11.21   Insulin Glargine (BASAGLAR KWIKPEN) 100 UNIT/ML Inject 60-65 Units into the skin at bedtime. Patient receives via [employer] Patient Assistance through Dec 2024.   insulin lispro (HUMALOG KWIKPEN) 100 UNIT/ML KwikPen Inject 10 Units into the skin 3 (three) times daily. Patient receives via [employer] Patient Assistance through Dec 2024.   Insulin Pen Needle (B-D UF III MINI PEN NEEDLES) 31G X 5 MM MISC Use to inject insulin four times daily   Lancets MISC Use as directed to monitor FSBS up to 4 x a day. Dx: E11.21 Z79.4   losartan (COZAAR) 100 MG tablet TAKE 1 TABLET BY MOUTH EVERY DAY   mirabegron ER (MYRBETRIQ) 50 MG TB24 tablet Take 1 tablet (50 mg total) by mouth daily.  naproxen (NAPROSYN) 500 MG tablet Take 500 mg by mouth 2 (two) times daily.   Omega-3 Fatty Acids (OMEGA-3 FISH OIL PO) Take by mouth.   No facility-administered encounter medications on file as of 04/12/2023.    Allergies (verified) Patient has no known allergies.   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   Closed intertrochanteric fracture of left hip, sequela 05/30/2016   Coronary artery disease 10/25/2017   CT scan March 2019   Diabetes mellitus without  complication (HCC)    Essential hypertension, benign 12/23/2015   History of colonoscopy with polypectomy 12/07/2016   Polypectomy x 12 per patient   Hx of pulmonary embolus 05/18/2016   Hospitalization Sept 2017, following hip fracture   Hx of tobacco use, presenting hazards to health 12/23/2015   Quit prior to 1997   Hx of tobacco use, presenting hazards to health 12/23/2015   Quit prior to 1997    Hyperlipidemia 12/23/2015   Hypertension    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   Obesity 12/23/2015   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   Pulmonary embolism (HCC) 05/18/2016   Hospitalization Sept 2017, following hip fracture   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   left ant lat thigh, SCCis in a SK, schedule EDC   Squamous cell carcinoma of skin 08/14/2021   L ant lat thigh, EDC 09/07/21   Tinea pedis 06/25/2016   Past Surgical History:  Procedure Laterality Date   CIRCUMCISION N/A 03/26/2022   Procedure: CIRCUMCISION ADULT;  Surgeon: Vanna Scotland, MD;  Location: [facility] ORS;  Service: Urology;  Laterality: N/A;   COLONOSCOPY WITH PROPOFOL N/A 01/15/2017   Procedure: COLONOSCOPY WITH PROPOFOL;  Surgeon: Wyline Mood, MD;  Location: [facility] ENDOSCOPY;  Service: Endoscopy;  Laterality: N/A;   COLONOSCOPY WITH PROPOFOL N/A 12/17/2019   Procedure: COLONOSCOPY WITH PROPOFOL;  Surgeon: Wyline Mood, MD;  Location: [facility] ENDOSCOPY;  Service: Gastroenterology;  Laterality: N/A;   EYE SURGERY Bilateral    cataract surgery   FRACTURE SURGERY     INTRAMEDULLARY (IM) NAIL INTERTROCHANTERIC Left 04/14/2016   Procedure: INTRAMEDULLARY (IM) NAIL INTERTROCHANTRIC;  Surgeon: Christena Flake, MD;  Location: [facility] ORS;  Service: Orthopedics;  Laterality: Left;   JOINT REPLACEMENT Left 04/2016   TOTAL HIP ARTHROPLASTY     Family History  Problem Relation Age of Onset   Cancer Mother    Diabetes Mother    Heart disease Father    Alcohol abuse Brother    Social History    Socioeconomic History   Marital status: Widowed    Spouse name: Not on file   Number of children: Not on file   Years of education: Not on file   Highest education level: Not on file  Occupational History   Not on file  Tobacco Use   Smoking status: Former    Current packs/day: 0.00    Types: Cigarettes    Quit date: 09/01/1978    Years since quitting: 44.6   Smokeless tobacco: Never  Vaping Use   Vaping status: Never Used  Substance and Sexual Activity   Alcohol use: No    Alcohol/week: 0.0 standard drinks of alcohol   Drug use: No   Sexual activity: Not Currently  Other Topics Concern   Not on file  Social History Narrative   Pt's daughter lives with him   Social Determinants  of Health   Financial Resource Strain: Low Risk  (04/12/2023)   Overall Financial Resource Strain (CARDIA)    Difficulty of Paying Living Expenses: Not very hard  Food Insecurity: No Food Insecurity (04/12/2023)   Hunger Vital Sign    Worried About Running Out of Food in the Last Year: Never true    Ran Out of Food in the Last Year: Never true  Transportation Needs: No Transportation Needs (04/12/2023)   PRAPARE - Administrator, Civil Service (Medical): No    Lack of Transportation (Non-Medical): No  Physical Activity: Sufficiently Active (04/12/2023)   Exercise Vital Sign    Days of Exercise per Week: 5 days    Minutes of Exercise per Session: 30 min  Stress: No Stress Concern Present (04/12/2023)   [employer] of Occupational Health - Occupational Stress Questionnaire    Feeling of Stress : Not at all  Social Connections: Moderately Isolated (04/12/2023)   Social Connection and Isolation Panel [NHANES]    Frequency of Communication with Friends and Family: More than three times a week    Frequency of Social Gatherings with Friends and Family: More than three times a week    Attends Religious Services: More than 4 times per year    Active Member of [employer] or Organizations: No     Attends Banker Meetings: Never    Marital Status: Widowed    Tobacco Counseling Counseling given: Not Answered   Clinical Intake:  Pre-visit preparation completed: Yes  Pain : No/denies pain     BMI - recorded: 37.16 Nutritional Status: BMI > 30  Obese Nutritional Risks: None Diabetes: Yes CBG done?: Yes (BS 141 this am at home) CBG resulted in Enter/ Edit results?: No Did pt. bring in CBG monitor from home?: No  How often do you need to have someone help you when you read instructions, pamphlets, or other written materials from your doctor or pharmacy?: 1 - Never  Interpreter Needed?: No  Comments: daughter lives pt Information entered by :: B.Epifania Littrell,LPN   Activities of Daily Living    04/12/2023    9:51 AM 03/13/2023   10:31 AM  In your present state of health, do you have any difficulty performing the following activities:  Hearing? 0 0  Vision? 0 0  Difficulty concentrating or making decisions? 0 0  Walking or climbing stairs? 0 0  Dressing or bathing? 0 0  Doing errands, shopping? 0 0  Preparing Food and eating ? N   Using the Toilet? N   In the past six months, have you accidently leaked urine? Y   Do you have problems with loss of bowel control? N   Managing your Medications? N   Managing your Finances? N   Housekeeping or managing your Housekeeping? N     Patient Care Team: Danelle Berry, PA-C as PCP - General (Family Medicine) Gaspar Cola, [facility] (Inactive) as Pharmacist (Pharmacist) Helane Gunther, DPM as Consulting Physician (Podiatry) Ortho, [facility] (Optometry)  Indicate any recent Medical Services you may have received from other than [facility] providers in the past year (date may be approximate).     Assessment:   This is a routine wellness examination for Derrius.  Hearing/Vision screen Hearing Screening - Comments:: Adequate hearing Vision Screening - Comments:: Adequate vision:hearing aids (do not  work)   Goals Addressed             This Visit's Progress    COMPLETED: Assistance  with utilities.   On track    COMPLETED: Monitor and Manage My Blood Sugar-Diabetes Type 2   On track    Timeframe:  Long-Range Goal Priority:  High Start Date:  10/27/2020                           Expected End Date:  04/30/2023                     Follow Up within 90 days   - check blood sugar twice daily: before meals and at bedtime  - check blood sugar if I feel it is too high or too low - enter blood sugar readings and medication or insulin into daily log - take the blood sugar log to all doctor visits    Why is this important?   Checking your blood sugar at home helps to keep it from getting very high or very low.  Writing the results in a diary or log helps the doctor know how to care for you.  Your blood sugar log should have the time, date and the results.  Also, write down the amount of insulin or other medicine that you take.  Other information, like what you ate, exercise done and how you were feeling, will also be helpful.     Notes:        Depression Screen    04/12/2023    9:49 AM 03/13/2023   10:31 AM 11/29/2022   11:47 AM 11/26/2022   10:39 AM 08/24/2022   11:09 AM 04/23/2022    9:42 AM 01/09/2022    9:09 AM  PHQ 2/9 Scores  PHQ - 2 Score 0 0 0 0 0 0 0  PHQ- 9 Score  0 0 0 0 0     Fall Risk    04/12/2023    9:46 AM 03/13/2023   10:31 AM 11/29/2022   11:46 AM 11/26/2022   10:39 AM 08/24/2022   11:09 AM  Fall Risk   Falls in the past year? 0 0 0 0 0  Number falls in past yr: 0 0 0 0 0  Injury with Fall? 0 0 0 0 0  Risk for fall due to : No Fall Risks No Fall Risks No Fall Risks No Fall Risks No Fall Risks  Follow up Education provided;Falls prevention discussed Falls prevention discussed;Education provided;Falls evaluation completed Falls prevention discussed;Education provided;Falls evaluation completed Falls prevention discussed;Education provided;Falls evaluation completed  Falls prevention discussed;Education provided;Falls evaluation completed    MEDICARE RISK AT HOME:   TIMED UP AND GO:  Was the test performed?  No    Cognitive Function:        04/12/2023    9:52 AM 10/17/2018    9:51 AM  6CIT Screen  What Year? 0 points 0 points  What month? 0 points 0 points  What time? 0 points 0 points  Count back from 20 -- 0 points  Months in reverse -- 0 points  Repeat phrase 6 points 2 points  Total Score  2 points    Immunizations Immunization History  Administered Date(s) Administered   Fluad Quad(high Dose 65+) 04/14/2019, 04/29/2020, 04/23/2022   Influenza, High Dose Seasonal PF 05/30/2015, 04/30/2017, 05/29/2018, 04/24/2021   Influenza-Unspecified 04/25/2016   Moderna Sars-Covid-2 Vaccination 09/18/2019, 10/16/2019, 07/05/2020   PNEUMOCOCCAL CONJUGATE-20 08/24/2022   Pneumococcal Conjugate-13 10/08/2013   Pneumococcal Polysaccharide-23 08/26/2017   Pneumococcal-Unspecified 04/17/2010   Td 05/13/2009   Zoster,  Live 10/11/2013    TDAP status: Up to date  Flu Vaccine status: Up to date  Pneumococcal vaccine status: Up to date  Covid-19 vaccine status: Completed vaccines  Qualifies for Shingles Vaccine? Yes   Zostavax completed No   Shingrix Completed?: No.    Education has been provided regarding the importance of this vaccine. Patient has been advised to call insurance company to determine out of pocket expense if they have not yet received this vaccine. Advised may also receive vaccine at local pharmacy or Health Dept. Verbalized acceptance and understanding.  Screening Tests Health Maintenance  Topic Date Due   DTaP/Tdap/Td (2 - Tdap) 05/14/2019   INFLUENZA VACCINE  03/07/2023   COVID-19 Vaccine (4 - 2023-24 season) 04/07/2023   Zoster Vaccines- Shingrix (1 of 2) 06/13/2023 (Originally 01/19/1965)   FOOT EXAM  08/25/2023   HEMOGLOBIN A1C  09/13/2023   OPHTHALMOLOGY EXAM  11/07/2023   Diabetic kidney evaluation - eGFR measurement   03/12/2024   Diabetic kidney evaluation - Urine ACR  03/12/2024   Medicare Annual Wellness (AWV)  04/11/2024   Colonoscopy  12/17/2024   Pneumonia Vaccine 80+ Years old  Completed   Hepatitis C Screening  Completed   HPV VACCINES  Aged Out    Health Maintenance  Health Maintenance Due  Topic Date Due   DTaP/Tdap/Td (2 - Tdap) 05/14/2019   INFLUENZA VACCINE  03/07/2023   COVID-19 Vaccine (4 - 2023-24 season) 04/07/2023    Colorectal cancer screening: No longer required.   Lung Cancer Screening: (Low Dose CT Chest recommended if Age 68-80 years, 20 pack-year currently smoking OR have quit w/in 15years.) does not qualify.   Lung Cancer Screening Referral: no  Additional Screening:  Hepatitis C Screening: does not qualify; Completed yes  Vision Screening: Recommended annual ophthalmology exams for early detection of glaucoma and other disorders of the eye. Is the patient up to date with their annual eye exam?  Yes  Who is the provider or what is the name of the office in which the patient attends annual eye exams? [facility] If pt is not established with a provider, would they like to be referred to a provider to establish care? No .   Dental Screening: Recommended annual dental exams for proper oral hygiene  Diabetic Foot Exam: Diabetic Foot Exam: Completed yes  Community Resource Referral / Chronic Care Management: CRR required this visit?  No   CCM required this visit?  No    Plan:     I have personally reviewed and noted the following in the patient's chart:   Medical and social history Use of alcohol, tobacco or illicit drugs  Current medications and supplements including opioid prescriptions. Patient is not currently taking opioid prescriptions. Functional ability and status Nutritional status Physical activity Advanced directives List of other physicians Hospitalizations, surgeries, and ER visits in previous 12 months Vitals Screenings to include  cognitive, depression, and falls Referrals and appointments  In addition, I have reviewed and discussed with patient certain preventive protocols, quality metrics, and best practice recommendations. A written personalized care plan for preventive services as well as general preventive health recommendations were provided to patient.   Sue Lush, LPN   4/0/9811   After Visit Summary: (MyChart) Due to this being a telephonic visit, the after visit summary with patients personalized plan was offered to patient via MyChart   Nurse Notes: Pt requests a refill of Naproxen: he is out. He is doing well has no concerns or questions.

## 2023-04-12 NOTE — Patient Instructions (Signed)
Edgar [name] , Thank you for taking time to come for your Medicare Wellness Visit. I appreciate your ongoing commitment to your health goals. Please review the following plan we discussed and let me know if I can assist you in the future.  :45am telephone Referrals/Orders/Follow-Ups/Clinician Recommendations: none  This is a list of the screening recommended for you and due dates:  Health Maintenance  Topic Date Due   DTaP/Tdap/Td vaccine (2 - Tdap) 05/14/2019   Flu Shot  03/07/2023   COVID-19 Vaccine (4 - 2023-24 season) 04/07/2023   Zoster (Shingles) Vaccine (1 of 2) 06/13/2023*   Complete foot exam   08/25/2023   Hemoglobin A1C  09/13/2023   Eye exam for diabetics  11/07/2023   Yearly kidney function blood test for diabetes  03/12/2024   Yearly kidney health urinalysis for diabetes  03/12/2024   Medicare Annual Wellness Visit  04/11/2024   Colon Cancer Screening  12/17/2024   Pneumonia Vaccine  Completed   Hepatitis C Screening  Completed   HPV Vaccine  Aged Out  *Topic was postponed. The date shown is not the original due date.    Advanced directives: (Copy Requested) Please bring a copy of your health care power of attorney and living will to the office to be added to your chart at your convenience.  Next Medicare Annual Wellness Visit scheduled for next year: Yes 04/17/2024 @ 9:45 am telephone

## 2023-04-18 ENCOUNTER — Ambulatory Visit: Payer: Medicare PPO | Admitting: Dermatology

## 2023-04-18 VITALS — BP 117/69 | HR 69

## 2023-04-18 DIAGNOSIS — L578 Other skin changes due to chronic exposure to nonionizing radiation: Secondary | ICD-10-CM

## 2023-04-18 DIAGNOSIS — Z8589 Personal history of malignant neoplasm of other organs and systems: Secondary | ICD-10-CM

## 2023-04-18 DIAGNOSIS — Z86007 Personal history of in-situ neoplasm of skin: Secondary | ICD-10-CM

## 2023-04-18 DIAGNOSIS — Z7189 Other specified counseling: Secondary | ICD-10-CM

## 2023-04-18 DIAGNOSIS — D229 Melanocytic nevi, unspecified: Secondary | ICD-10-CM

## 2023-04-18 DIAGNOSIS — L821 Other seborrheic keratosis: Secondary | ICD-10-CM

## 2023-04-18 DIAGNOSIS — Z1283 Encounter for screening for malignant neoplasm of skin: Secondary | ICD-10-CM

## 2023-04-18 DIAGNOSIS — W908XXA Exposure to other nonionizing radiation, initial encounter: Secondary | ICD-10-CM

## 2023-04-18 DIAGNOSIS — L219 Seborrheic dermatitis, unspecified: Secondary | ICD-10-CM

## 2023-04-18 DIAGNOSIS — L918 Other hypertrophic disorders of the skin: Secondary | ICD-10-CM

## 2023-04-18 DIAGNOSIS — Z79899 Other long term (current) drug therapy: Secondary | ICD-10-CM

## 2023-04-18 MED ORDER — KETOCONAZOLE 2 % EX SHAM
MEDICATED_SHAMPOO | CUTANEOUS | 11 refills | Status: DC
Start: 2023-04-18 — End: 2023-08-22

## 2023-04-18 NOTE — Patient Instructions (Addendum)
Seborrheic Dermatitis is a chronic persistent rash characterized by pinkness and scaling most commonly of the mid face but also can occur on the scalp (dandruff), ears; mid chest, mid back and groin.  It tends to be exacerbated by stress and cooler weather.  People who have neurologic disease may experience new onset or exacerbation of existing seborrheic dermatitis.  The condition is not curable but treatable and can be controlled.  Start ketoconazole shampoo use as a wash 2 x weekly to face and scalp. Let sit for a few minutes before rinsing off     Melanoma ABCDEs  Melanoma is the most dangerous type of skin cancer, and is the leading cause of death from skin disease.  You are more likely to develop melanoma if you: Have light-colored skin, light-colored eyes, or red or blond hair Spend a lot of time in the sun Tan regularly, either outdoors or in a tanning bed Have had blistering sunburns, especially during childhood Have a close family member who has had a melanoma Have atypical moles or large birthmarks  Early detection of melanoma is key since treatment is typically straightforward and cure rates are extremely high if we catch it early.   The first sign of melanoma is often a change in a mole or a new dark spot.  The ABCDE system is a way of remembering the signs of melanoma.  A for asymmetry:  The two halves do not match. B for border:  The edges of the growth are irregular. C for color:  A mixture of colors are present instead of an even brown color. D for diameter:  Melanomas are usually (but not always) greater than 6mm - the size of a pencil eraser. E for evolution:  The spot keeps changing in size, shape, and color.  Please check your skin once per month between visits. You can use a small mirror in front and a large mirror behind you to keep an eye on the back side or your body.   If you see any new or changing lesions before your next follow-up, please call to schedule a  visit.  Please continue daily skin protection including broad spectrum sunscreen SPF 30+ to sun-exposed areas, reapplying every 2 hours as needed when you're outdoors.   Staying in the shade or wearing long sleeves, sun glasses (UVA+UVB protection) and wide brim hats (4-inch brim around the entire circumference of the hat) are also recommended for sun protection.    Due to recent changes in healthcare laws, you may see results of your pathology and/or laboratory studies on MyChart before the doctors have had a chance to review them. We understand that in some cases there may be results that are confusing or concerning to you. Please understand that not all results are received at the same time and often the doctors may need to interpret multiple results in order to provide you with the best plan of care or course of treatment. Therefore, we ask that you please give [place] 2 business days to thoroughly review all your results before contacting the office for clarification. Should we see a critical lab result, you will be contacted sooner.   If You Need Anything After Your Visit  If you have any questions or concerns for your doctor, please call our main line at [phone] and press option 4 to reach your doctor's medical assistant. If no one answers, please leave a voicemail as directed and we will return your call as soon as possible. Messages  left after 4 pm will be answered the following business day.   You may also send [place] a message via MyChart. We typically respond to MyChart messages within 1-2 business days.  For prescription refills, please ask your pharmacy to contact our office. Our fax number is [phone].  If you have an urgent issue when the clinic is closed that cannot wait until the next business day, you can page your doctor at the number below.    Please note that while we do our best to be available for urgent issues outside of office hours, we are not available 24/7.   If you  have an urgent issue and are unable to reach [place], you may choose to seek medical care at your doctor's office, retail clinic, urgent care center, or emergency room.  If you have a medical emergency, please immediately call 911 or go to the emergency department.  Pager Numbers  - Dr. Gwen Pounds: [phone]  - Dr. Roseanne Reno: [phone]  - Dr. Katrinka Blazing: [phone]   In the event of inclement weather, please call our main line at [phone] for an update on the status of any delays or closures.  Dermatology Medication Tips: Please keep the boxes that topical medications come in in order to help keep track of the instructions about where and how to use these. Pharmacies typically print the medication instructions only on the boxes and not directly on the medication tubes.   If your medication is too expensive, please contact our office at [phone] option 4 or send [place] a message through MyChart.   We are unable to tell what your co-pay for medications will be in advance as this is different depending on your insurance coverage. However, we may be able to find a substitute medication at lower cost or fill out paperwork to get insurance to cover a needed medication.   If a prior authorization is required to get your medication covered by your insurance company, please allow [place] 1-2 business days to complete this process.  Drug prices often vary depending on where the prescription is filled and some pharmacies may offer cheaper prices.  The website www.goodrx.com contains coupons for medications through different pharmacies. The prices here do not account for what the cost may be with help from insurance (it may be cheaper with your insurance), but the website can give you the price if you did not use any insurance.  - You can print the associated coupon and take it with your prescription to the pharmacy.  - You may also stop by our office during regular business hours and pick up a GoodRx coupon  card.  - If you need your prescription sent electronically to a different pharmacy, notify our office through [facility] or by phone at [phone] option 4.     Si Usted Necesita Algo Despus de Su Visita  Tambin puede enviarnos un mensaje a travs de Clinical cytogeneticist. Por lo general respondemos a los mensajes de MyChart en el transcurso de 1 a 2 das hbiles.  Para renovar recetas, por favor pida a su farmacia que se ponga en contacto con nuestra oficina. Annie Sable de fax es [place] [phone].  Si tiene un asunto urgente cuando la clnica est cerrada y que no puede esperar hasta el siguiente da hbil, puede llamar/localizar a su doctor(a) al nmero que aparece a continuacin.   Por favor, tenga en cuenta que aunque hacemos todo lo posible para estar disponibles para asuntos urgentes fuera del horario de [place], no  estamos disponibles las 24 horas del da, los 7 [address]  [address] de la [place].   Si tiene un problema urgente y no puede comunicarse con nosotros, puede optar por buscar atencin mdica  en el consultorio de su doctor(a), en una clnica privada, en un centro de atencin urgente o en una sala de emergencias.  Si tiene Engineer, drilling, por favor llame inmediatamente al 911 o vaya a la sala de emergencias.  Nmeros de bper  - Dr. Gwen Pounds: [phone]  - Dra. Roseanne Reno: [phone]  - Dr. Katrinka Blazing: [phone]   En caso de inclemencias del tiempo, por favor llame a Lacy Duverney principal al [phone] para una actualizacin sobre el [place] de cualquier retraso o cierre.  Consejos para la medicacin en dermatologa: Por favor, guarde las cajas en las que vienen los medicamentos de uso tpico para ayudarle a seguir las instrucciones sobre dnde y cmo usarlos. Las farmacias generalmente imprimen las instrucciones del medicamento slo en las cajas y no directamente en los tubos del [place].   Si su medicamento es muy caro, por favor, pngase en contacto con Rolm Gala llamando al [phone] y presione la opcin 4 o envenos un mensaje a travs de Clinical cytogeneticist.   No podemos decirle cul ser su copago por los medicamentos por adelantado ya que esto es diferente dependiendo de la cobertura de su seguro. Sin embargo, es posible que podamos encontrar un medicamento sustituto a Audiological scientist un formulario para que el seguro cubra el medicamento que se considera necesario.   Si se requiere una autorizacin previa para que su compaa de seguros [place] su medicamento, por favor permtanos de 1 a 2 das hbiles para completar [address]  Los precios de los medicamentos varan con frecuencia dependiendo del Environmental consultant de dnde se surte la receta y alguna farmacias pueden ofrecer precios ms baratos.  El sitio web www.goodrx.com tiene cupones para medicamentos de Health and safety inspector. Los precios aqu no tienen en cuenta lo que podra costar con la ayuda del seguro (puede ser ms barato con su seguro), pero el sitio web puede darle el precio si no utiliz Tourist information centre manager.  - Puede imprimir el cupn correspondiente y llevarlo con su receta a la farmacia.  - Tambin puede pasar por nuestra oficina durante el horario de atencin regular y Education officer, museum una tarjeta de cupones de GoodRx.  - Si necesita que su receta se enve electrnicamente a una farmacia diferente, informe a nuestra oficina a travs de MyChart de [place] o por telfono llamando al [phone] y presione la opcin 4.

## 2023-04-18 NOTE — Progress Notes (Signed)
Follow-Up Visit   Subjective  Edgar [name] is a 77 y.o. 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 The patient has spots, moles and lesions to be evaluated, some may be new or changing and the patient may have concern these could be cancer.    The following portions of the chart were reviewed this encounter and updated as appropriate: medications, allergies, medical history  Review of Systems:  No other skin or systemic complaints except as noted in HPI or Assessment and Plan.  Objective  Well appearing patient in no apparent distress; mood and affect are within normal limits.  A full examination was performed including scalp, head, eyes, ears, nose, lips, neck, chest, axillae, abdomen, back, buttocks, bilateral upper extremities, bilateral lower extremities, hands, feet, fingers, toes, fingernails, and toenails. All findings within normal limits unless otherwise noted below.   Relevant physical exam findings are noted in the Assessment and Plan.       Assessment & Plan   SEBORRHEIC DERMATITIS Exam: Pink patches with greasy scale at face and scalp  Chronic and persistent condition with duration or expected duration over one year. Condition is bothersome/symptomatic for patient. Currently flared.   Seborrheic Dermatitis is a chronic persistent rash characterized by pinkness and scaling most commonly of the mid face but also can occur on the scalp (dandruff), ears; mid chest, mid back and groin.  It tends to be exacerbated by stress and cooler weather.  People who have neurologic disease may experience new onset or exacerbation of existing seborrheic dermatitis.  The condition is not curable but treatable and can be controlled.  Treatment Plan:  Start ketoconzole 2 % shampoo -  apply 2 times per week, use as  a body wash / shampoo apply topically to face and scalp  and leave in for 5 minutes before rinsing out    SKIN CANCER SCREENING PERFORMED TODAY.  ACTINIC DAMAGE - Chronic condition, secondary to cumulative UV/sun exposure - diffuse scaly erythematous macules with underlying dyspigmentation - Recommend daily broad spectrum sunscreen SPF 30+ to sun-exposed areas, reapply every 2 hours as needed.  - Staying in the shade or wearing long sleeves, sun glasses (UVA+UVB protection) and wide brim hats (4-inch brim around the entire circumference of the hat) are also recommended for sun protection.  - Call for new or changing lesions.   SEBORRHEIC KERATOSES,  Benign normal skin lesions - Benign-appearing - Call for any changes  MELANOCYTIC NEVI Exam: Tan-brown and/or pink-flesh-colored symmetric macules and papules  Treatment Plan: Benign appearing on exam today. Recommend observation. Call clinic for new or changing moles. Recommend daily use of broad spectrum spf 30+ sunscreen to sun-exposed areas.     Acrochordons (Skin Tags) at trunk - Fleshy, skin-colored pedunculated papules - Benign appearing.  - Observe. - If desired, they can be removed with an in office procedure that is not covered by insurance. - Please call the clinic if you notice any new or changing lesions.  HISTORY OF SQUAMOUS CELL CARCINOMA IN SITU OF THE SKIN  See above photo of SCCIS scar at left anterior thigh- No evidence of recurrence today - Recommend regular full body skin exams - Recommend daily broad spectrum sunscreen SPF 30+ to sun-exposed areas, reapply every 2 hours as needed.  - Call if any new or changing lesions are noted  between office visits    Return in about 1 year (around 04/17/2024) for TBSE.  IAsher Muir, CMA, am acting as scribe for Armida Sans, MD.   Documentation: I have reviewed the above documentation for accuracy and completeness, and I agree with the above.  Armida Sans,  MD

## 2023-04-21 ENCOUNTER — Encounter: Payer: Self-pay | Admitting: Dermatology

## 2023-05-16 ENCOUNTER — Encounter: Payer: Self-pay | Admitting: Podiatry

## 2023-05-16 ENCOUNTER — Ambulatory Visit: Payer: Medicare PPO | Admitting: Podiatry

## 2023-05-16 DIAGNOSIS — M79676 Pain in unspecified toe(s): Secondary | ICD-10-CM | POA: Diagnosis not present

## 2023-05-16 DIAGNOSIS — Z794 Long term (current) use of insulin: Secondary | ICD-10-CM

## 2023-05-16 DIAGNOSIS — B351 Tinea unguium: Secondary | ICD-10-CM | POA: Diagnosis not present

## 2023-05-16 DIAGNOSIS — E1121 Type 2 diabetes mellitus with diabetic nephropathy: Secondary | ICD-10-CM | POA: Diagnosis not present

## 2023-05-17 ENCOUNTER — Other Ambulatory Visit: Payer: Self-pay | Admitting: Internal Medicine

## 2023-05-17 DIAGNOSIS — E782 Mixed hyperlipidemia: Secondary | ICD-10-CM

## 2023-05-17 NOTE — Telephone Encounter (Signed)
Requested Prescriptions  Pending Prescriptions Disp Refills   atorvastatin (LIPITOR) 40 MG tablet [Pharmacy Med Name: ATORVASTATIN 40 MG TABLET] 90 tablet 1    Sig: TAKE 1 TABLET BY MOUTH EVERYDAY AT BEDTIME     Cardiovascular:  Antilipid - Statins Failed - 05/17/2023  9:13 AM      Failed - Lipid Panel in normal range within the last 12 months    Cholesterol, Total  Date Value Ref Range Status  12/23/2015 129 100 - 199 mg/dL Final   Cholesterol  Date Value Ref Range Status  08/24/2022 117 <200 mg/dL Final   LDL Cholesterol (Calc)  Date Value Ref Range Status  08/24/2022 66 mg/dL (calc) Final    Comment:    Reference range: <100 . Desirable range <100 mg/dL for primary prevention;   <70 mg/dL for patients with CHD or diabetic patients  with > or = 2 CHD risk factors. Marland Kitchen LDL-C is now calculated using the Martin-Hopkins  calculation, which is a validated novel method providing  better accuracy than the Friedewald equation in the  estimation of LDL-C.  Horald Pollen et al. Lenox Ahr. 4098;119(14): 2061-2068  (http://education.Quest[employer].com/faq/FAQ164)    HDL  Date Value Ref Range Status  08/24/2022 37 (L) > OR = 40 mg/dL Final  78/29/5621 35 (L) >39 mg/dL Final   Triglycerides  Date Value Ref Range Status  08/24/2022 60 <150 mg/dL Final         Passed - Patient is not pregnant      Passed - Valid encounter within last 12 months    Recent Outpatient Visits           2 months ago Type 2 diabetes mellitus with diabetic nephropathy, 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HCC)   [place] [facility] [place], Sheliah Mends, PA-C   5 months ago Type 2 diabetes mellitus with diabetic nephropathy, with long-term current use of insulin [facility])   [place] [facility] Danelle Berry, PA-C   5 months ago Type 2 diabetes mellitus with diabetic nephropathy, with long-term current use of insulin [facility])   [place] [facility] Danelle Berry, PA-C    8 months ago Type 2 diabetes mellitus with diabetic nephropathy, with long-term current use of insulin [facility])   [place] [facility] Margarita Mail, DO   1 year ago Hypertension associated with type 2 diabetes mellitus [facility])   [place] [facility] Danelle Berry, PA-C       Future Appointments             In 1 month [place], Elana Alm [facility] [place]   In 1 month Danelle Berry, PA-C [facility], [place]   In 11 months Deirdre Evener, MD [facility]

## 2023-05-21 NOTE — Progress Notes (Signed)
Subjective:  Patient ID: Edgar [name], male    DOB: 04/02/1946,  MRN: [id number]  77 y.o. male presents to clinic with  at risk foot care. Pt has h/o NIDDM with chronic kidney disease and painful thick toenails that are difficult to trim. Pain interferes with ambulation. Aggravating factors include wearing enclosed shoe gear. Pain is relieved with periodic professional debridement.  Chief Complaint  Patient presents with   Diabetes    BS-109 A1C- PCPV-     New problem(s): None   PCP is Danelle Berry, PA-C.  No Known Allergies  Review of Systems: Negative except as noted in the HPI.   Objective:  Edgar [name] is a pleasant 77 y.o. male obese in NAD.Marland Kitchen  Vascular Examination: Vascular status intact b/l with palpable pedal pulses. CFT immediate b/l. No edema. No pain with calf compression b/l. Skin temperature gradient WNL b/l. Pedal hair absent.  Neurological Examination: Sensation grossly intact b/l with 10 gram monofilament. Vibratory sensation intact b/l.   Dermatological Examination: Pedal skin with normal turgor, texture and tone b/l. Toenails 1-5 b/l thick, discolored, elongated with subungual debris and pain on dorsal palpation. No hyperkeratotic lesions noted b/l.   Musculoskeletal Examination: Muscle strength 5/5 to b/l LE. No pain, crepitus or joint limitation noted with ROM bilateral LE. HAV with bunion deformity noted b/l LE.  Radiographs: None  Last A1c:      Latest Ref Rng & Units 03/13/2023   11:12 AM 11/26/2022   11:22 AM 08/24/2022   11:59 AM  Hemoglobin A1C  Hemoglobin-A1c <5.7 % of total Hgb 6.7  7.4  7.2      Assessment:   1. Pain due to onychomycosis of toenail   2. Type 2 diabetes mellitus with diabetic nephropathy, 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HCC)    Plan:  -Consent given for treatment as described below: -Examined patient. -Continue foot and shoe inspections daily. Monitor blood glucose per PCP/Endocrinologist's  recommendations. -Continue supportive shoe gear daily. -Mycotic toenails 1-5 bilaterally were debrided in length and girth with sterile nail nippers and dremel without incident. -Patient/POA to call should there be question/concern in the interim.  Return in about 3 months (around 08/16/2023).  Freddie Breech, DPM

## 2023-05-23 ENCOUNTER — Other Ambulatory Visit: Payer: Self-pay | Admitting: Family Medicine

## 2023-05-23 DIAGNOSIS — I10 Essential (primary) hypertension: Secondary | ICD-10-CM

## 2023-06-11 ENCOUNTER — Telehealth: Payer: Self-pay | Admitting: Pharmacist

## 2023-06-11 NOTE — Progress Notes (Signed)
   Outreach Note  06/11/2023 Name: Edgar [name] MRN: [id number] DOB: [birth year]  Referred by: Danelle Berry, PA-C Reason for referral : Medication Assistance  Outreach to patient today related to medication assistance. From review of chart, appears previously enrolled in [place] and [employer] patient assistance programs.   Was unable to reach patient via telephone today and have left HIPAA compliant voicemail asking patient to return my call.    Estelle Grumbles, PharmD, [facility] [phone]

## 2023-06-18 ENCOUNTER — Encounter: Payer: Self-pay | Admitting: Family Medicine

## 2023-06-18 ENCOUNTER — Other Ambulatory Visit: Payer: Self-pay

## 2023-06-18 DIAGNOSIS — Z794 Long term (current) use of insulin: Secondary | ICD-10-CM

## 2023-06-18 MED ORDER — DAPAGLIFLOZIN PROPANEDIOL 10 MG PO TABS
10.0000 mg | ORAL_TABLET | Freq: Every morning | ORAL | 0 refills | Status: DC
Start: 2023-06-18 — End: 2023-08-13

## 2023-06-21 ENCOUNTER — Telehealth: Payer: Self-pay | Admitting: Family Medicine

## 2023-06-21 NOTE — Telephone Encounter (Signed)
Ferry Hottle (daughter) came by to pick up paperwork for [facility] re-enrollment to fill out his portion. I was not able to locate the paperwork. She would like to know if we fill out our portion can you then upload it in his mychart. That way she can print it out and finish the form. She lives 30 minutes away and he is going to need her help filling out the forms. Please give her a call [phone]

## 2023-06-24 NOTE — Telephone Encounter (Signed)
Pt will come in to pick up paperwork.

## 2023-07-02 ENCOUNTER — Other Ambulatory Visit: Payer: Self-pay

## 2023-07-02 DIAGNOSIS — E1121 Type 2 diabetes mellitus with diabetic nephropathy: Secondary | ICD-10-CM

## 2023-07-02 MED ORDER — BASAGLAR KWIKPEN 100 UNIT/ML ~~LOC~~ SOPN: 60.0000 [IU] | PEN_INJECTOR | Freq: Every day | SUBCUTANEOUS | Status: DC

## 2023-07-02 MED ORDER — INSULIN LISPRO (1 UNIT DIAL) 100 UNIT/ML (KWIKPEN)
10.0000 [IU] | PEN_INJECTOR | Freq: Three times a day (TID) | SUBCUTANEOUS | 1 refills | Status: DC
Start: 2023-07-02 — End: 2023-08-22

## 2023-07-02 NOTE — Telephone Encounter (Signed)
[employer] care forms are in your folder to sign for renewal of meds. Thanks!

## 2023-07-08 NOTE — Progress Notes (Unsigned)
07/11/2023 12:33 PM   Drucie Ip Revoir September 19, 1945 [id number]  Referring provider: Danelle Berry, PA-C [address] [place],  [place] [address]  Urological history: 1. 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 BPH with LU TS -PSA (08/2022) 2.60 -mirabegron ER 50 mg daily   No chief complaint on file.  HPI: Edgar [name] is a 77 y.o. male who presents today for for 6 month follow up.    Previous records reviewed.   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   Closed intertrochanteric fracture of left hip, sequela 05/30/2016   Coronary artery disease 10/25/2017   CT scan March 2019   Diabetes mellitus without complication (HCC)    Essential hypertension, benign 12/23/2015   History of colonoscopy with polypectomy 12/07/2016   Polypectomy x 12 per patient   Hx of pulmonary embolus 05/18/2016   Hospitalization Sept 2017, following hip fracture   Hx of tobacco use, presenting hazards to health 12/23/2015   Quit prior to 1997   Hx of tobacco use, presenting hazards to health 12/23/2015   Quit prior to 1997    Hyperlipidemia 12/23/2015   Hypertension    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   Obesity 12/23/2015   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   Pulmonary embolism (HCC) 05/18/2016   Hospitalization Sept 2017, following hip fracture   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   left ant lat thigh, SCCis in a SK, schedule EDC   Squamous cell carcinoma of skin 08/14/2021   L ant lat thigh, EDC 09/07/21   Tinea pedis 06/25/2016    Surgical History: Past Surgical History:  Procedure Laterality Date   CIRCUMCISION N/A 03/26/2022   Procedure: CIRCUMCISION ADULT;  Surgeon: Vanna Scotland, MD;  Location: [facility] ORS;  Service:  Urology;  Laterality: N/A;   COLONOSCOPY WITH PROPOFOL N/A 01/15/2017   Procedure: COLONOSCOPY WITH PROPOFOL;  Surgeon: Wyline Mood, MD;  [facility] END[facility]COPY;  Service: Endoscopy;  Laterality: N/A;   COLONOSCOPY WITH PROPOFOL N/A 12/17/2019   Procedure: COLONOSCOPY WITH PROPOFOL;  Surgeon: Wyline Mood, MD;  Location: [facility] ENDOSCOPY;  Service: Gastroenterology;  Laterality: N/A;   EYE SURGERY Bilateral    cataract surgery   FRACTURE SURGERY     INTRAMEDULLARY (IM) NAIL INTERTROCHANTERIC Left 04/14/2016   Procedure: INTRAMEDULLARY (IM) NAIL INTERTROCHANTRIC;  Surgeon: Christena Flake, MD;  Location: [facility] ORS;  Service: Orthopedics;  Laterality: Left;   JOINT REPLACEMENT Left 04/2016   TOTAL HIP ARTHROPLASTY      Home Medications:  Allergies as of 07/11/2023   No Known Allergies      Medication List        Accurate as of July 08, 2023 12:33 PM. If you have any questions, ask your nurse or doctor.          Accu-Chek Guide test strip Generic drug: glucose blood USE IN THE MORNING AT NOON AND AT BEDTIME E11.21   acetaminophen 500 MG tablet Commonly known as: TYLENOL Take 500 mg by mouth every 6 (six) hours as needed.   amLODipine 10 MG tablet Commonly known as: NORVASC Take 1 tablet (10 mg total) by mouth daily.   aspirin EC 81 MG tablet Take 81 mg  by mouth daily. Swallow whole.   atorvastatin 40 MG tablet Commonly known as: LIPITOR TAKE 1 TABLET BY MOUTH EVERYDAY AT BEDTIME   B-D UF III MINI PEN NEEDLES 31G X 5 MM Misc Generic drug: Insulin Pen Needle Use to inject insulin four times daily   Basaglar KwikPen 100 UNIT/ML Inject 60-65 Units into the skin at bedtime. Patient receives via [employer] Patient Assistance through Dec 2024.   cholecalciferol 1000 units tablet Commonly known as: VITAMIN D Take 1,000 Units by mouth 2 (two) times daily.   dapagliflozin propanediol 10 MG Tabs tablet Commonly known as: Farxiga Take 1 tablet (10 mg total) by mouth in  the morning.   insulin lispro 100 UNIT/ML KwikPen Commonly known as: HumaLOG KwikPen Inject 10 Units into the skin 3 (three) times daily. Patient receives via [employer] Patient Assistance through Dec 2024.   ketoconazole 2 % shampoo Commonly known as: NIZORAL apply 2 times per week, massage into scalp and apply to face as body wash and leave in for 5 minutes before rinsing out/off   Lancets Misc Use as directed to monitor FSBS up to 4 x a day. Dx: E11.21 Z79.4   losartan 100 MG tablet Commonly known as: COZAAR TAKE 1 TABLET BY MOUTH EVERY DAY   mirabegron ER 50 MG Tb24 tablet Commonly known as: MYRBETRIQ Take 1 tablet (50 mg total) by mouth daily.   naproxen 500 MG tablet Commonly known as: NAPROSYN Take 500 mg by mouth 2 (two) times daily.   OMEGA-3 FISH OIL PO Take by mouth.        Allergies: No Known Allergies  Family History: Family History  Problem Relation Age of Onset   Cancer Mother    Diabetes Mother    Heart disease Father    Alcohol abuse Brother     Social History:  reports that he quit smoking about 44 years ago. His smoking use included cigarettes. He has never used smokeless tobacco. He reports that he does not drink alcohol and does not use drugs.  ROS: Pertinent ROS in HPI  Physical Exam: There were no vitals taken for this visit.  Constitutional:  Well nourished. Alert and oriented, No acute distress. HEENT: [place] AT, moist mucus membranes.  Trachea midline, no masses. Cardiovascular: No clubbing, cyanosis, or edema. Respiratory: Normal respiratory effort, no increased work of breathing. GI: Abdomen is soft, non tender, non distended, no abdominal masses. Liver and spleen not palpable.  No hernias appreciated.  Stool sample for occult testing is not indicated.   GU: No CVA tenderness.  No bladder fullness or masses.  Patient with circumcised/uncircumcised phallus. ***Foreskin easily retracted***  Urethral meatus is patent.  No penile discharge.  No penile lesions or rashes. Scrotum without lesions, cysts, rashes and/or edema.  Testicles are located scrotally bilaterally. No masses are appreciated in the testicles. Left and right epididymis are normal. Rectal: Patient with  normal sphincter tone. Anus and perineum without scarring or rashes. No rectal masses are appreciated. Prostate is approximately *** grams, *** nodules are appreciated. Seminal vesicles are normal. Skin: No rashes, bruises or suspicious lesions. Lymph: No cervical or inguinal adenopathy. Neurologic: Grossly intact, no focal deficits, moving all 4 extremities. Psychiatric: Normal mood and affect.   Laboratory Data: Component     Latest Ref Rng 03/13/2023  Hemoglobin A1C     <5.7 % of total Hgb 6.7 (H)   Mean Plasma Glucose     mg/dL 657   eAG (mmol/L)  mmol/L 8.1     Legend: (H) High  CMP     Component Value Date/Time   NA 141 03/13/2023 1112   NA 139 01/06/2016 0918   K 4.7 03/13/2023 1112   CL 109 03/13/2023 1112   CO2 27 03/13/2023 1112   GLUCOSE 113 (H) 03/13/2023 1112   BUN 13 03/13/2023 1112   BUN 16 01/06/2016 0918   CREATININE 1.11 03/13/2023 1112   CALCIUM 10.0 03/13/2023 1112   PROT 7.2 03/13/2023 1112   PROT 7.5 12/23/2015 0959   ALBUMIN 4.0 12/07/2016 0957   ALBUMIN 3.8 01/06/2016 0918   AST 12 03/13/2023 1112   ALT 10 03/13/2023 1112   ALKPHOS 87 12/07/2016 0957   BILITOT 0.6 03/13/2023 1112   BILITOT 0.5 12/23/2015 0959   EGFR 69 03/13/2023 1112   GFRNONAA >60 03/22/2022 1133   GFRNONAA 69 08/01/2020 0946  I have reviewed the labs.   Pertinent Imaging: ***  Assessment & Plan:    1. Urge incontinence -At goal with Myrbetriq 50 mg daily -He will continue the Myrbetriq 50 mg daily  2. Post void dribbling ***  No follow-ups on file.  These notes generated with voice recognition software. I apologize for typographical errors.  Cloretta Ned  [facility] [address]  [address] [place], [place] [address] [phone]

## 2023-07-11 ENCOUNTER — Ambulatory Visit: Payer: Medicare PPO | Admitting: Urology

## 2023-07-11 ENCOUNTER — Encounter: Payer: Self-pay | Admitting: Urology

## 2023-07-11 VITALS — BP 127/71 | HR 75

## 2023-07-11 DIAGNOSIS — N3943 Post-void dribbling: Secondary | ICD-10-CM | POA: Diagnosis not present

## 2023-07-11 DIAGNOSIS — N3941 Urge incontinence: Secondary | ICD-10-CM

## 2023-07-11 LAB — BLADDER SCAN AMB NON-IMAGING: Scan Result: 45

## 2023-07-11 MED ORDER — TROSPIUM CHLORIDE ER 60 MG PO CP24: 60.0000 mg | ORAL_CAPSULE | Freq: Every day | ORAL | 3 refills | Status: DC

## 2023-07-15 ENCOUNTER — Ambulatory Visit: Payer: Medicare PPO | Admitting: Physician Assistant

## 2023-07-26 ENCOUNTER — Other Ambulatory Visit: Payer: Self-pay | Admitting: Family Medicine

## 2023-07-26 DIAGNOSIS — Z794 Long term (current) use of insulin: Secondary | ICD-10-CM

## 2023-08-06 ENCOUNTER — Ambulatory Visit (INDEPENDENT_AMBULATORY_CARE_PROVIDER_SITE_OTHER): Payer: Medicare PPO

## 2023-08-06 ENCOUNTER — Other Ambulatory Visit: Payer: Self-pay | Admitting: Podiatry

## 2023-08-06 ENCOUNTER — Ambulatory Visit: Payer: Medicare PPO | Admitting: Podiatry

## 2023-08-06 DIAGNOSIS — S92912A Unspecified fracture of left toe(s), initial encounter for closed fracture: Secondary | ICD-10-CM

## 2023-08-09 ENCOUNTER — Encounter: Payer: Self-pay | Admitting: Podiatry

## 2023-08-09 ENCOUNTER — Ambulatory Visit: Payer: Medicare PPO | Admitting: Podiatry

## 2023-08-09 DIAGNOSIS — S92405A Nondisplaced unspecified fracture of left great toe, initial encounter for closed fracture: Secondary | ICD-10-CM | POA: Diagnosis not present

## 2023-08-09 NOTE — Progress Notes (Signed)
 Chief Complaint  Patient presents with   Foot Pain    I dropped some metal on my foot.  It's black.  I'm Diabetic. N - black toes L - 2nd, 3rd mpj D - 1 week O - suddenly C - black, bruised, sore A - walking T - xray by Delydia on Tues.    HPI: 78 y.o. male presenting today for evaluation of an injury that was sustained about 1 week ago when the patient dropped a piece of metal on his left great toe.  Currently has minimal pain.  He is able to ambulate just fine.  He would like to have it evaluated.  He actually came into the office on 08/06/2023.  There was no physician in the office but x-rays were taken at that time.  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   Closed intertrochanteric fracture of left hip, sequela 05/30/2016   Coronary artery disease 10/25/2017   CT scan March 2019   Diabetes mellitus without complication (HCC)    Essential hypertension, benign 12/23/2015   History of colonoscopy with polypectomy 12/07/2016   Polypectomy x 12 per patient   Hx of pulmonary embolus 05/18/2016   Hospitalization Sept 2017, following hip fracture   Hx of tobacco use, presenting hazards to health 12/23/2015   Quit prior to 1997   Hx of tobacco use, presenting hazards to health 12/23/2015   Quit prior to 1997    Hyperlipidemia 12/23/2015   Hypertension    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   Obesity 12/23/2015   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   Pulmonary embolism (HCC) 05/18/2016   Hospitalization Sept 2017, following hip fracture   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   left ant lat thigh, SCCis in a SK, schedule EDC   Squamous cell carcinoma of skin 08/14/2021   L ant lat thigh, EDC 09/07/21   Tinea pedis 06/25/2016    Past Surgical History:  Procedure Laterality Date   CIRCUMCISION N/A 03/26/2022   Procedure:  CIRCUMCISION ADULT;  Surgeon: Penne Knee, MD;  Location: [facility] ORS;  Service: Urology;  Laterality: N/A;   COLONOSCOPY WITH PROPOFOL  N/A 01/15/2017   Procedure: COLONOSCOPY WITH PROPOFOL ;  Surgeon: Therisa Bi, MD;  Location: [facility] ENDOSCOPY;  Service: Endoscopy;  Laterality: N/A;   COLONOSCOPY WITH PROPOFOL  N/A 12/17/2019   Procedure: COLONOSCOPY WITH PROPOFOL ;  Surgeon: Therisa Bi, MD;  Location: [facility] ENDOSCOPY;  Service: Gastroenterology;  Laterality: N/A;   EYE SURGERY Bilateral    cataract surgery   FRACTURE SURGERY     INTRAMEDULLARY (IM) NAIL INTERTROCHANTERIC Left 04/14/2016   Procedure: INTRAMEDULLARY (IM) NAIL INTERTROCHANTRIC;  Surgeon: Norleen JINNY Maltos, MD;  Location: [facility] ORS;  Service: Orthopedics;  Laterality: Left;   JOINT REPLACEMENT Left 04/2016   TOTAL HIP ARTHROPLASTY      No Known Allergies   Physical Exam: General: The patient is alert and oriented x3 in no acute distress.  Dermatology: Skin is warm, dry and supple bilateral lower extremities.   Vascular: Palpable pedal pulses bilaterally. Capillary refill within normal limits.  Skin is warm to touch.  Ecchymosis noted around the great toe and the lesser MTPs secondary to the history of injury  Neurological: Diminished via light touch  Musculoskeletal Exam: No pedal deformities noted.  Mild tenderness to palpation throughout the great toe  Radiographic Exam LT foot 08/06/2023:  Moderate degenerative changes noted throughout the pedal joints of the foot.  Acute fracture noted to the distal portion of the proximal phalanx of the left great toe extending into the IPJ.  Minimally displaced. Accessory ossicles also noted throughout the lesser MTPs  Assessment/Plan of Care: 1.  Closed minimally displaced fracture proximal phalanx left great toe  -Patient evaluated.  X-rays reviewed -The patient has no pain associated to the great toe.  Recommend rest ice and elevation -Continue wearing diabetic shoes and insoles.   Patient declined a postop shoe -Return to clinic 6 weeks follow-up x-ray       Thresa EMERSON Sar, DPM [facility]  Dr. Thresa EMERSON Sar, DPM    [address] [place], [place] [address]                Office [phone]  Fax [phone]

## 2023-08-11 ENCOUNTER — Other Ambulatory Visit: Payer: Self-pay | Admitting: Internal Medicine

## 2023-08-11 DIAGNOSIS — I10 Essential (primary) hypertension: Secondary | ICD-10-CM

## 2023-08-13 ENCOUNTER — Telehealth: Payer: Self-pay | Admitting: Family Medicine

## 2023-08-13 ENCOUNTER — Other Ambulatory Visit: Payer: Self-pay

## 2023-08-13 ENCOUNTER — Telehealth: Payer: Self-pay

## 2023-08-13 DIAGNOSIS — E1121 Type 2 diabetes mellitus with diabetic nephropathy: Secondary | ICD-10-CM

## 2023-08-13 MED ORDER — DAPAGLIFLOZIN PROPANEDIOL 10 MG PO TABS: 10.0000 mg | ORAL_TABLET | Freq: Every morning | ORAL | 0 refills | Status: DC

## 2023-08-13 NOTE — Telephone Encounter (Signed)
 Copied from CRM [id number]. Topic: General - Other >> Aug 13, 2023  2:35 PM Ja-Kwan M wrote: Reason for CRM: Pt daughter Corean stated that [facility] informed her that they have not received the paperwork and pt can not get any refills until the paperwork is received. [name] requests call back asap. Cb# [phone]

## 2023-08-13 NOTE — Telephone Encounter (Signed)
 error

## 2023-08-13 NOTE — Telephone Encounter (Signed)
 Sent to wrong pharmacy.

## 2023-08-13 NOTE — Telephone Encounter (Signed)
 Patient will need an office visit for further refills. Requested Prescriptions  Pending Prescriptions Disp Refills   amLODipine  (NORVASC ) 10 MG tablet [Pharmacy Med Name: AMLODIPINE  BESYLATE 10 MG TAB] 90 tablet 0    Sig: TAKE 1 TABLET BY MOUTH EVERY DAY     Cardiovascular: Calcium  Channel Blockers 2 Passed - 08/13/2023  3:11 PM      Passed - Last BP in normal range    BP Readings from Last 1 Encounters:  07/11/23 127/71         Passed - Last Heart Rate in normal range    Pulse Readings from Last 1 Encounters:  07/11/23 75         Passed - Valid encounter within last 6 months    Recent Outpatient Visits           5 months ago Type 2 diabetes mellitus with diabetic nephropathy, with long-term current use of insulin  [facility])   [place] [facility] Leavy Mole, PA-C   8 months ago Type 2 diabetes mellitus with diabetic nephropathy, with long-term current use of insulin  [facility])   [place] [facility] Leavy Mole, PA-C   8 months ago Type 2 diabetes mellitus with diabetic nephropathy, with long-term current use of insulin  [facility])   [place] [facility] Leavy Mole, PA-C   11 months ago Type 2 diabetes mellitus with diabetic nephropathy, with long-term current use of insulin  [facility])   [place] [facility] Bernardo Fend, DO   1 year ago Hypertension associated with type 2 diabetes mellitus [facility])   [place] [facility] Leavy Mole, PA-C       Future Appointments             In 1 week McGowan, Clotilda DELENA RIGGERS [facility] [place]   In 8 months Hester Alm BROCKS, MD [facility]

## 2023-08-13 NOTE — Telephone Encounter (Signed)
 Daughter aware it was filled out and faxed last month

## 2023-08-19 ENCOUNTER — Ambulatory Visit: Payer: Medicare PPO | Admitting: Podiatry

## 2023-08-19 NOTE — Progress Notes (Deleted)
08/22/2023 12:45 PM   Drucie Ip Kopinski Oct 27, 1945 [id number]  Referring provider: Danelle Berry, PA-C [address] [place],  [place] [address]  Urological history: 1. 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 BPH with LU TS -PSA (08/2022) 2.60 -Myrbetriq not covered by insurance  No chief complaint on file.  HPI: Edgar [name] is a 78 y.o. male who presents today for for 6  week follow up after starting Sanctura XL 60 mg daily.  Previous records reviewed.   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   Closed intertrochanteric fracture of left hip, sequela 05/30/2016   Coronary artery disease 10/25/2017   CT scan March 2019   Diabetes mellitus without complication (HCC)    Essential hypertension, benign 12/23/2015   History of colonoscopy with polypectomy 12/07/2016   Polypectomy x 12 per patient   Hx of pulmonary embolus 05/18/2016   Hospitalization Sept 2017, following hip fracture   Hx of tobacco use, presenting hazards to health 12/23/2015   Quit prior to 1997   Hx of tobacco use, presenting hazards to health 12/23/2015   Quit prior to 1997    Hyperlipidemia 12/23/2015   Hypertension    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   Obesity 12/23/2015   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   Pulmonary embolism (HCC) 05/18/2016   Hospitalization Sept 2017, following hip fracture   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   left ant lat thigh, SCCis in a SK, schedule EDC   Squamous cell carcinoma of skin 08/14/2021   L ant lat thigh, EDC 09/07/21   Tinea pedis 06/25/2016    Surgical History: Past Surgical History:  Procedure Laterality Date   CIRCUMCISION N/A 03/26/2022   Procedure: CIRCUMCISION ADULT;  Surgeon: Vanna Scotland,  MD;  Location: [facility] ORS;  Service: Urology;  Laterality: N/A;   COLONOSCOPY WITH PROPOFOL N/A 01/15/2017   Procedure: COLONOSCOPY WITH PROPOFOL;  Surgeon: Wyline Mood, MD;  Location: [facility] ENDOSCOPY;  Service: Endoscopy;  Laterality: N/A;   COLONOSCOPY WITH PROPOFOL N/A 12/17/2019   Procedure: COLONOSCOPY WITH PROPOFOL;  Surgeon: Wyline Mood, MD;  Location: [facility] ENDOSCOPY;  Service: Gastroenterology;  Laterality: N/A;   EYE SURGERY Bilateral    cataract surgery   FRACTURE SURGERY     INTRAMEDULLARY (IM) NAIL INTERTROCHANTERIC Left 04/14/2016   Procedure: INTRAMEDULLARY (IM) NAIL INTERTROCHANTRIC;  Surgeon: Christena Flake, MD;  Location: [facility] ORS;  Service: Orthopedics;  Laterality: Left;   JOINT REPLACEMENT Left 04/2016   TOTAL HIP ARTHROPLASTY      Home Medications:  Allergies as of 08/22/2023   No Known Allergies      Medication List        Accurate as of August 19, 2023 12:45 PM. If you have any questions, ask your nurse or doctor.          Accu-Chek Guide test strip Generic drug: glucose blood USE IN THE MORNING AT NOON AND AT BEDTIME E11.21   acetaminophen 500 MG tablet Commonly known as: TYLENOL Take 500 mg by mouth every 6 (six) hours as needed.   amLODipine 10 MG tablet Commonly known as: NORVASC TAKE 1 TABLET BY MOUTH EVERY DAY   aspirin EC 81 MG tablet Take 81 mg  by mouth daily. Swallow whole.   atorvastatin 40 MG tablet Commonly known as: LIPITOR TAKE 1 TABLET BY MOUTH EVERYDAY AT BEDTIME   B-D UF III MINI PEN NEEDLES 31G X 5 MM Misc Generic drug: Insulin Pen Needle Use to inject insulin four times daily   Basaglar KwikPen 100 UNIT/ML DIAL AND INJECT 60 UNITS UNDER THE SKIN AT BEDTIME. MAX DOSE PER DAY 60 UNITS.   cholecalciferol 1000 units tablet Commonly known as: VITAMIN D Take 1,000 Units by mouth 2 (two) times daily.   dapagliflozin propanediol 10 MG Tabs tablet Commonly known as: Farxiga Take 1 tablet (10 mg total) by mouth in the morning.    insulin lispro 100 UNIT/ML KwikPen Commonly known as: HumaLOG KwikPen Inject 10 Units into the skin 3 (three) times daily. Patient receives via [employer] Patient Assistance through Dec 2024.   ketoconazole 2 % shampoo Commonly known as: NIZORAL apply 2 times per week, massage into scalp and apply to face as body wash and leave in for 5 minutes before rinsing out/off   Lancets Misc Use as directed to monitor FSBS up to 4 x a day. Dx: E11.21 Z79.4   losartan 100 MG tablet Commonly known as: COZAAR TAKE 1 TABLET BY MOUTH EVERY DAY   naproxen 500 MG tablet Commonly known as: NAPROSYN Take 500 mg by mouth 2 (two) times daily.   OMEGA-3 FISH OIL PO Take by mouth.   Trospium Chloride 60 MG Cp24 Take 1 capsule (60 mg total) by mouth daily.        Allergies: No Known Allergies  Family History: Family History  Problem Relation Age of Onset   Cancer Mother    Diabetes Mother    Heart disease Father    Alcohol abuse Brother     Social History:  reports that he quit smoking about 44 years ago. His smoking use included cigarettes. He has never used smokeless tobacco. He reports that he does not drink alcohol and does not use drugs.  ROS: Pertinent ROS in HPI  Physical Exam: There were no vitals taken for this visit.  Constitutional:  Well nourished. Alert and oriented, No acute distress. HEENT: [place] AT, moist mucus membranes.  Trachea midline, no masses. Cardiovascular: No clubbing, cyanosis, or edema. Respiratory: Normal respiratory effort, no increased work of breathing. GI: Abdomen is soft, non tender, non distended, no abdominal masses. Liver and spleen not palpable.  No hernias appreciated.  Stool sample for occult testing is not indicated.   GU: No CVA tenderness.  No bladder fullness or masses.  Patient with circumcised/uncircumcised phallus. ***Foreskin easily retracted***  Urethral meatus is patent.  No penile discharge. No penile lesions or rashes. Scrotum without  lesions, cysts, rashes and/or edema.  Testicles are located scrotally bilaterally. No masses are appreciated in the testicles. Left and right epididymis are normal. Rectal: Patient with  normal sphincter tone. Anus and perineum without scarring or rashes. No rectal masses are appreciated. Prostate is approximately *** grams, *** nodules are appreciated. Seminal vesicles are normal. Skin: No rashes, bruises or suspicious lesions. Lymph: No cervical or inguinal adenopathy. Neurologic: Grossly intact, no focal deficits, moving all 4 extremities. Psychiatric: Normal mood and affect.   Laboratory Data: N/A  Pertinent Imaging: ***  Assessment & Plan:    1. Urge incontinence -He has not been taking the Myrbetriq and does not recall taking the medication -We will have a trial of Sanctura XL 60 mg daily as it does appear to be covered by his  insurance -He will return in 6 weeks for repeat IPSS and PVR  2. Post void dribbling -This is not is much a concern for him as it is for the urgency and urge incontinence  No follow-ups on file.  These notes generated with voice recognition software. I apologize for typographical errors.  Cloretta Ned  [facility] [address]  [address] [place], [place] [address] [phone]

## 2023-08-21 ENCOUNTER — Ambulatory Visit: Payer: Medicare PPO | Admitting: Family Medicine

## 2023-08-22 ENCOUNTER — Other Ambulatory Visit: Payer: Self-pay | Admitting: Family Medicine

## 2023-08-22 ENCOUNTER — Ambulatory Visit: Payer: Medicare PPO | Admitting: Family Medicine

## 2023-08-22 ENCOUNTER — Encounter: Payer: Self-pay | Admitting: Family Medicine

## 2023-08-22 ENCOUNTER — Ambulatory Visit: Payer: Medicare PPO | Admitting: Urology

## 2023-08-22 VITALS — BP 126/82 | HR 60 | Temp 98.3°F | Resp 18 | Ht 72.0 in | Wt 268.1 lb

## 2023-08-22 DIAGNOSIS — N3943 Post-void dribbling: Secondary | ICD-10-CM

## 2023-08-22 DIAGNOSIS — E1121 Type 2 diabetes mellitus with diabetic nephropathy: Secondary | ICD-10-CM

## 2023-08-22 DIAGNOSIS — E1159 Type 2 diabetes mellitus with other circulatory complications: Secondary | ICD-10-CM | POA: Diagnosis not present

## 2023-08-22 DIAGNOSIS — I152 Hypertension secondary to endocrine disorders: Secondary | ICD-10-CM

## 2023-08-22 DIAGNOSIS — R809 Proteinuria, unspecified: Secondary | ICD-10-CM

## 2023-08-22 DIAGNOSIS — Z794 Long term (current) use of insulin: Secondary | ICD-10-CM

## 2023-08-22 DIAGNOSIS — E66812 Obesity, class 2: Secondary | ICD-10-CM | POA: Insufficient documentation

## 2023-08-22 DIAGNOSIS — Z6836 Body mass index (BMI) 36.0-36.9, adult: Secondary | ICD-10-CM | POA: Insufficient documentation

## 2023-08-22 DIAGNOSIS — E782 Mixed hyperlipidemia: Secondary | ICD-10-CM

## 2023-08-22 DIAGNOSIS — I2510 Atherosclerotic heart disease of native coronary artery without angina pectoris: Secondary | ICD-10-CM

## 2023-08-22 DIAGNOSIS — N3941 Urge incontinence: Secondary | ICD-10-CM

## 2023-08-22 DIAGNOSIS — Z5181 Encounter for therapeutic drug level monitoring: Secondary | ICD-10-CM

## 2023-08-22 DIAGNOSIS — I7121 Aneurysm of the ascending aorta, without rupture: Secondary | ICD-10-CM

## 2023-08-22 DIAGNOSIS — I700 Atherosclerosis of aorta: Secondary | ICD-10-CM

## 2023-08-22 MED ORDER — LOSARTAN POTASSIUM 100 MG PO TABS: 100.0000 mg | ORAL_TABLET | Freq: Every day | ORAL | 1 refills | Status: DC

## 2023-08-22 MED ORDER — DAPAGLIFLOZIN PROPANEDIOL 10 MG PO TABS: 10.0000 mg | ORAL_TABLET | Freq: Every morning | ORAL | 1 refills | Status: DC

## 2023-08-22 MED ORDER — LANTUS SOLOSTAR 100 UNIT/ML ~~LOC~~ SOPN: 50.0000 [IU] | PEN_INJECTOR | Freq: Every day | SUBCUTANEOUS | 5 refills | Status: DC

## 2023-08-22 MED ORDER — BD PEN NEEDLE MINI U/F 31G X 5 MM MISC: 11 refills | Status: DC

## 2023-08-22 MED ORDER — GVOKE HYPOPEN 2-PACK 1 MG/0.2ML ~~LOC~~ SOAJ: 1.0000 mg | SUBCUTANEOUS | 1 refills | Status: AC | PRN

## 2023-08-22 MED ORDER — INSULIN LISPRO (1 UNIT DIAL) 100 UNIT/ML (KWIKPEN): 10.0000 [IU] | PEN_INJECTOR | Freq: Three times a day (TID) | SUBCUTANEOUS | 2 refills | Status: DC

## 2023-08-22 MED ORDER — ATORVASTATIN CALCIUM 40 MG PO TABS: ORAL_TABLET | ORAL | 1 refills | Status: DC

## 2023-08-22 NOTE — Telephone Encounter (Signed)
Requested medication (s) are due for refill today: yes  Requested medication (s) are on the active medication list: yes  Last refill:  08/22/23  Future visit scheduled: yes  Notes to clinic:  Pharmacy comment: Alternative Requested:THE PRESCRIBED MEDICATION IS NOT COVERED BY INSURANCE. PLEASE CONSIDER CHANGING TO ONE OF THE SUGGESTED COVERED ALTERNATIVES.      Requested Prescriptions  Pending Prescriptions Disp Refills   ZEGALOGUE 0.6 MG/0.6ML SOSY [Pharmacy Med Name: ZEGALOGUE 0.6 MG/0.6 ML SYRING]  0     Off-Protocol Failed - 08/22/2023  2:30 PM      Failed - Medication not assigned to a protocol, review manually.      Passed - Valid encounter within last 12 months    Recent Outpatient Visits           Today Type 2 diabetes mellitus with diabetic nephropathy, with long-term current use of insulin [facility])   [place] [facility] Danelle Berry, PA-C   5 months ago Type 2 diabetes mellitus with diabetic nephropathy, with long-term current use of insulin [facility])   [place] [facility] Danelle Berry, PA-C   8 months ago Type 2 diabetes mellitus with diabetic nephropathy, with long-term current use of insulin [facility])   [place] [facility] Danelle Berry, PA-C   8 months ago Type 2 diabetes mellitus with diabetic nephropathy, with long-term current use of insulin [facility])   [place] [facility] Danelle Berry, PA-C   12 months ago Type 2 diabetes mellitus with diabetic nephropathy, with long-term current use of insulin [facility])   [place] [facility] Margarita Mail, DO       Future Appointments             In 1 week McGowan, Elana Alm [facility] [place]   In 3 months Danelle Berry, PA-C [facility]   In 8 months Deirdre Evener, MD [facility] [place]

## 2023-08-22 NOTE — Telephone Encounter (Signed)
Requested medication (s) are due for refill today: alternative requested  Requested medication (s) are on the active medication list: yes  Last refill:  08/22/23  Future visit scheduled: yes  Notes to clinic: Pharmacy comment: Alternative Requested:THE PRESCRIBED MEDICATION IS NOT COVERED BY INSURANCE. PLEASE CONSIDER CHANGING TO ONE OF THE SUGGESTED COVERED ALTERNATIVES.      Requested Prescriptions  Pending Prescriptions Disp Refills   ZEGALOGUE 0.6 MG/0.6ML SOSY [Pharmacy Med Name: ZEGALOGUE 0.6 MG/0.6 ML SYRING]  0     Off-Protocol Failed - 08/22/2023  2:28 PM      Failed - Medication not assigned to a protocol, review manually.      Passed - Valid encounter within last 12 months    Recent Outpatient Visits           Today Type 2 diabetes mellitus with diabetic nephropathy, with long-term current use of insulin [facility])   [place] [facility] Danelle Berry, PA-C   5 months ago Type 2 diabetes mellitus with diabetic nephropathy, with long-term current use of insulin [facility])   [place] [facility] Danelle Berry, PA-C   8 months ago Type 2 diabetes mellitus with diabetic nephropathy, with long-term current use of insulin [facility])   [place] [facility] Danelle Berry, PA-C   8 months ago Type 2 diabetes mellitus with diabetic nephropathy, with long-term current use of insulin [facility])   [place] [facility] Danelle Berry, PA-C   12 months ago Type 2 diabetes mellitus with diabetic nephropathy, with long-term current use of insulin [facility])   [place] [facility] Margarita Mail, DO       Future Appointments             In 1 week McGowan, Elana Alm [facility] [place]   In 3 months Danelle Berry, PA-C [facility]   In 8 months Deirdre Evener, MD [facility]

## 2023-08-22 NOTE — Progress Notes (Signed)
Name: Edgar [name]   MRN: [id number]    DOB: 1945-08-30   Date:08/22/2023       Progress Note  Chief Complaint  Patient presents with   Medical Management of Chronic Issues   Diabetes    Patients daughter wants farxiga, and 2 insulins printed   Hyperlipidemia   Hypertension     Subjective:   Edgar [name] is a 78 y.o. male, presents to clinic for routine f/up and med refill  He needs Rx's printed for him per daughter request  Edgar [name], Edgar [name] (Daughter) [phone] (Home Phone)   Insulin and farxiga previously through assistance program with [facility]  DM:   Pt managing DM with farxiga, lantus 65 units  night and humalog 10 TID Denies: Polyuria, polydipsia, vision changes, neuropathy, hypoglycemia Reviewed his gluclose book he brought in with him. Recent pertinent labs: Lab Results  Component Value Date   HGBA1C 6.7 (H) 03/13/2023   HGBA1C 7.4 (H) 11/26/2022   HGBA1C 7.2 (H) 08/24/2022   Lab Results  Component Value Date   MICROALBUR 17.9 03/13/2023   LDLCALC 66 08/24/2022   CREATININE 1.11 03/13/2023   Standard of care and health maintenance: Urine Microalbumin:  due Foot exam:  done today, seeing podiatry for nails and injury to left foot (dropped something on foot/toes) DM eye exam:  due april ACEI/ARB:  yes - losartan Statin:  yes lipitor Pt keeps log notebook - see photos:   Hyperlipidemia: Currently treated with lipitor, pt reports good med compliance Last Lipids: Lab Results  Component Value Date   CHOL 117 08/24/2022   HDL 37 (L) 08/24/2022   LDLCALC 66 08/24/2022   TRIG 60 08/24/2022   CHOLHDL 3.2 08/24/2022   - Denies: Chest pain, shortness of breath, myalgias, claudication  Hypertension:  Currently managed on losartan Pt reports good med compliance and denies any SE.   Blood pressure today is well controlled. BP Readings from Last 3 Encounters:  08/22/23 126/82  07/11/23 127/71  04/18/23 117/69   Pt denies CP, SOB, exertional sx,  LE edema, palpitation, Ha's, visual disturbances, lightheadedness, hypotension, syncope.    Obesity- weight down ~6 lbs Wt Readings from Last 5 Encounters:  08/22/23 268 lb 1.6 oz (121.6 kg)  04/12/23 274 lb (124.3 kg)  03/13/23 274 lb 11.2 oz (124.6 kg)  01/10/23 276 lb (125.2 kg)  12/25/22 275 lb (124.7 kg)   BMI Readings from Last 5 Encounters:  08/22/23 36.36 kg/m  04/12/23 37.16 kg/m  03/13/23 37.26 kg/m  01/10/23 37.43 kg/m  12/25/22 37.30 kg/m   Pt states he is active, doing well, no concerns with ambulation, no exertional sx His daughter starting to care for him more Wife passed away about 3 years ago      Current Outpatient Medications:    amLODipine (NORVASC) 10 MG tablet, TAKE 1 TABLET BY MOUTH EVERY DAY, Disp: 90 tablet, Rfl: 0   aspirin EC 81 MG tablet, Take 81 mg by mouth daily. Swallow whole., Disp: , Rfl:    atorvastatin (LIPITOR) 40 MG tablet, TAKE 1 TABLET BY MOUTH EVERYDAY AT BEDTIME, Disp: 90 tablet, Rfl: 1   dapagliflozin propanediol (FARXIGA) 10 MG TABS tablet, Take 1 tablet (10 mg total) by mouth in the morning., Disp: 90 tablet, Rfl: 0   glucose blood (ACCU-CHEK GUIDE) test strip, USE IN THE MORNING AT NOON AND AT BEDTIME E11.21, Disp: 100 strip, Rfl: 6   Insulin Glargine (BASAGLAR KWIKPEN) 100 UNIT/ML, DIAL AND INJECT 60 UNITS UNDER THE SKIN AT  BEDTIME. MAX DOSE PER DAY 60 UNITS., Disp: 75 mL, Rfl: 0   insulin lispro (HUMALOG KWIKPEN) 100 UNIT/ML KwikPen, Inject 10 Units into the skin 3 (three) times daily. Patient receives via [employer] Patient Assistance through Dec 2024., Disp: 15 mL, Rfl: 1   Insulin Pen Needle (B-D UF III MINI PEN NEEDLES) 31G X 5 MM MISC, Use to inject insulin four times daily, Disp: 200 each, Rfl: 11   Lancets MISC, Use as directed to monitor FSBS up to 4 x a day. Dx: E11.21 Z79.4, Disp: 100 each, Rfl: 11   losartan (COZAAR) 100 MG tablet, TAKE 1 TABLET BY MOUTH EVERY DAY, Disp: 90 tablet, Rfl: 1   Trospium Chloride 60 MG  CP24, Take 1 capsule (60 mg total) by mouth daily., Disp: 30 capsule, Rfl: 3  Patient Active Problem List   Diagnosis Date Noted   Lower urinary tract symptoms (LUTS) 11/29/2022   Urine test positive for microalbuminuria 11/29/2022   Hypertension associated with type 2 diabetes mellitus (HCC) 12/21/2021   Acquired trigger finger of right index finger 12/30/2020   Aortic atherosclerosis (HCC) 08/01/2020   Coronary artery disease 10/25/2017   Aneurysm of thoracic aorta (HCC) 09/24/2017   History of colonoscopy with polypectomy 12/07/2016   Fatty liver 09/11/2016   Essential hypertension, benign 12/23/2015   Mixed hyperlipidemia 12/23/2015   Morbid obesity (HCC) 12/23/2015   Type 2 diabetes mellitus with diabetic nephropathy, 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HCC) 01/25/2015    Past Surgical History:  Procedure Laterality Date   CIRCUMCISION N/A 03/26/2022   Procedure: CIRCUMCISION ADULT;  Surgeon: Vanna Scotland, MD;  Location: [facility] ORS;  Service: Urology;  Laterality: N/A;   COLONOSCOPY WITH PROPOFOL N/A 01/15/2017   Procedure: COLONOSCOPY WITH PROPOFOL;  Surgeon: Wyline Mood, MD;  Location: [facility] ENDOSCOPY;  Service: Endoscopy;  Laterality: N/A;   COLONOSCOPY WITH PROPOFOL N/A 12/17/2019   Procedure: COLONOSCOPY WITH PROPOFOL;  Surgeon: Wyline Mood, MD;  Location: [facility] ENDOSCOPY;  Service: Gastroenterology;  Laterality: N/A;   EYE SURGERY Bilateral    cataract surgery   FRACTURE SURGERY     INTRAMEDULLARY (IM) NAIL INTERTROCHANTERIC Left 04/14/2016   Procedure: INTRAMEDULLARY (IM) NAIL INTERTROCHANTRIC;  Surgeon: Christena Flake, MD;  Location: [facility] ORS;  Service: Orthopedics;  Laterality: Left;   JOINT REPLACEMENT Left 04/2016   TOTAL HIP ARTHROPLASTY      Family History  Problem Relation Age of Onset   Cancer Mother    Diabetes Mother    Heart disease Father    Alcohol abuse Brother     Social History   Tobacco Use   Smoking status: Former    Current packs/day: 0.00     Types: Cigarettes    Quit date: 09/01/1978    Years since quitting: 45.0   Smokeless tobacco: Never  Vaping Use   Vaping status: Never Used  Substance Use Topics   Alcohol use: No    Alcohol/week: 0.0 standard drinks of alcohol   Drug use: No     No Known Allergies  Health Maintenance  Topic Date Due   INFLUENZA VACCINE  03/07/2023   COVID-19 Vaccine (4 - 2024-25 season) 09/07/2023 (Originally 04/07/2023)   Zoster Vaccines- Shingrix (1 of 2) 11/20/2023 (Originally 01/19/1965)   DTaP/Tdap/Td (2 - Tdap) 08/21/2024 (Originally 05/14/2019)   FOOT EXAM  08/25/2023   HEMOGLOBIN A1C  09/13/2023   OPHTHALMOLOGY EXAM  11/07/2023   Diabetic kidney evaluation - eGFR measurement  03/12/2024   Diabetic kidney evaluation - Urine ACR  03/12/2024   Medicare Annual Wellness (AWV)  04/11/2024   Colonoscopy  12/17/2024   Pneumonia Vaccine 72+ Years old  Completed   Hepatitis C Screening  Completed   HPV VACCINES  Aged Out    Chart Review Today: I personally reviewed active problem list, medication list, allergies, family history, social history, health maintenance, notes from last encounter, lab results, imaging with the patient/caregiver today.   Review of Systems  Constitutional: Negative.   HENT: Negative.    Eyes: Negative.   Respiratory: Negative.    Cardiovascular: Negative.   Gastrointestinal: Negative.   Endocrine: Negative.   Genitourinary: Negative.   Musculoskeletal: Negative.   Skin: Negative.   Allergic/Immunologic: Negative.   Neurological: Negative.   Hematological: Negative.   Psychiatric/Behavioral: Negative.    All other systems reviewed and are negative.    Objective:   Vitals:   08/22/23 1003  BP: 126/82  Pulse: 60  Resp: 18  Temp: 98.3 F (36.8 C)  SpO2: 94%  Weight: 268 lb 1.6 oz (121.6 kg)  Height: 6' (1.829 m)    Body mass index is 36.36 kg/m.  Physical Exam Vitals and nursing note reviewed.  Constitutional:      General: He is not in  acute distress.    Appearance: Normal appearance. He is well-developed. He is obese. He is not ill-appearing, toxic-appearing or diaphoretic.     Interventions: Face mask in place.  HENT:     Head: Normocephalic and atraumatic.     Jaw: No trismus.     Right Ear: External ear normal.     Left Ear: External ear normal.     Mouth/Throat:     Mouth: Mucous membranes are moist.     Pharynx: Oropharynx is clear.  Eyes:     General: Lids are normal. No scleral icterus.       Right eye: No discharge.        Left eye: No discharge.     Conjunctiva/sclera: Conjunctivae normal.  Neck:     Trachea: Trachea and phonation normal. No tracheal deviation.  Cardiovascular:     Rate and Rhythm: Normal rate and regular rhythm.     Pulses: Normal pulses.          Radial pulses are 2+ on the right side and 2+ on the left side.       Dorsalis pedis pulses are 2+ on the right side and 2+ on the left side.       Posterior tibial pulses are 2+ on the right side and 2+ on the left side.     Heart sounds: Normal heart sounds. No murmur heard.    No friction rub. No gallop.     Comments: 1+ pitting bilateral pretibial edema  Pulmonary:     Effort: Pulmonary effort is normal. No respiratory distress.     Breath sounds: Normal breath sounds. No stridor. No wheezing, rhonchi or rales.  Abdominal:     General: Bowel sounds are normal. There is no distension.     Palpations: Abdomen is soft.  Musculoskeletal:     Right lower leg: Edema present.     Left lower leg: Edema present.     Right foot: Normal range of motion.     Left foot: Normal range of motion.  Feet:     Right foot:     Skin integrity: Skin integrity normal.     Toenail Condition: Right toenails are abnormally thick and long.     Left foot:  Skin integrity: Skin integrity normal.     Toenail Condition: Left toenails are abnormally thick and long.  Skin:    General: Skin is warm and dry.     Coloration: Skin is not jaundiced.      Findings: No rash.     Nails: There is no clubbing.  Neurological:     Mental Status: He is alert. Mental status is at baseline.     Cranial Nerves: No dysarthria or facial asymmetry.     Motor: No tremor or abnormal muscle tone.  Psychiatric:        Mood and Affect: Mood normal.        Speech: Speech normal.        Behavior: Behavior normal. Behavior is cooperative.     Diabetic Foot Exam - Simple   Simple Foot Form Diabetic Foot exam was performed with the following findings: Yes 08/22/2023 10:49 AM  Visual Inspection No deformities, no ulcerations, no other skin breakdown bilaterally: Yes Sensation Testing Intact to touch and monofilament testing bilaterally: Yes Pulse Check Posterior Tibialis and Dorsalis pulse intact bilaterally: Yes Comments         Assessment & Plan:     ICD-10-CM   1. Type 2 diabetes mellitus with diabetic nephropathy, 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HCC)  E11.21 Lipid panel   Z79.4 Hemoglobin A1C    COMPLETE METABOLIC PANEL WITH GFR    Microalbumin / creatinine urine ratio    CBC with Differential/Platelet    dapagliflozin propanediol (FARXIGA) 10 MG TABS tablet    Insulin Pen Needle (B-D UF III MINI PEN NEEDLES) 31G X 5 MM MISC    insulin lispro (HUMALOG KWIKPEN) 100 UNIT/ML KwikPen    insulin glargine (LANTUS SOLOSTAR) 100 UNIT/ML Solostar Pen    GVOKE HYPOPEN 2-PACK 1 MG/0.2ML SOAJ   has been well controlled, due for labs, dm foot exam done, will adjust meds per A1C result, on statin/ARB    2. Hypertension associated with type 2 diabetes mellitus (HCC)  E11.59 COMPLETE METABOLIC PANEL WITH GFR   I15.2 losartan (COZAAR) 100 MG tablet   Bp well controlled and at goal today on losartan, no changes    3. Aortic atherosclerosis (HCC)  I70.0 Lipid panel    Hemoglobin A1C    COMPLETE METABOLIC PANEL WITH GFR   on statin, monitoring    4. Mixed hyperlipidemia  E78.2 Lipid panel    COMPLETE METABOLIC PANEL WITH GFR    atorvastatin  (LIPITOR) 40 MG tablet   on lipitor, due for labs    5. Urine test positive for microalbuminuria  R80.9 Microalbumin / creatinine urine ratio   on farxiga, recheck urine    6. Aneurysm of ascending aorta without rupture (HCC)  I71.21    annual monitoring via CT and vascular - last done in april/may 2024, 4.5 cm and stable,continue with HTN and HLD management    7. Coronary artery disease involving native coronary artery of native heart without angina pectoris  I25.10 Lipid panel    COMPLETE METABOLIC PANEL WITH GFR    atorvastatin (LIPITOR) 40 MG tablet   no current exertional sx, pt IDDM, HTN and HLD well controlled, contiue monitoring    8. Class 2 severe obesity with serious comorbidity and body mass index (BMI) of 36.0 to 36.9 in adult, unspecified obesity type (HCC)  Z61.096 Lipid panel   Z68.36 Hemoglobin A1C   E66.01 COMPLETE METABOLIC PANEL WITH GFR    Microalbumin / creatinine urine ratio  CBC with Differential/Platelet   associated HTN, HLD, IDDM, CAD, pt is active, working on healthy diet and lifestyle and is compliant with meds and f/up visits    9. Encounter for medication monitoring  Z51.81 Lipid panel    Hemoglobin A1C    COMPLETE METABOLIC PANEL WITH GFR    Microalbumin / creatinine urine ratio    CBC with Differential/Platelet         Return in about 3 months (around 11/20/2023) for Routine follow-up.   Danelle Berry, PA-C 08/22/23 10:35 AM

## 2023-08-22 NOTE — Patient Instructions (Signed)
I have printed your requested prescriptions. We have previously had the pharmacist also help get patient assistance from specialty pharmacies.  Please let us know if you are unable to get your medications or if you need them sent again to different places, we are happy to help.

## 2023-08-23 ENCOUNTER — Other Ambulatory Visit: Payer: Self-pay | Admitting: Family Medicine

## 2023-08-23 DIAGNOSIS — E1121 Type 2 diabetes mellitus with diabetic nephropathy: Secondary | ICD-10-CM

## 2023-08-23 DIAGNOSIS — Z794 Long term (current) use of insulin: Secondary | ICD-10-CM

## 2023-08-23 LAB — CBC WITH DIFFERENTIAL/PLATELET
Absolute Lymphocytes: 2048 {cells}/uL (ref 850–3900)
Absolute Monocytes: 422 {cells}/uL (ref 200–950)
Basophils Absolute: 38 {cells}/uL (ref 0–200)
Basophils Relative: 0.6 %
Eosinophils Absolute: 192 {cells}/uL (ref 15–500)
Eosinophils Relative: 3 %
HCT: 45.3 % (ref 38.5–50.0)
Hemoglobin: 14.8 g/dL (ref 13.2–17.1)
MCH: 28.3 pg (ref 27.0–33.0)
MCHC: 32.7 g/dL (ref 32.0–36.0)
MCV: 86.6 fL (ref 80.0–100.0)
MPV: 11.2 fL (ref 7.5–12.5)
Monocytes Relative: 6.6 %
Neutro Abs: 3699 {cells}/uL (ref 1500–7800)
Neutrophils Relative %: 57.8 %
Platelets: 200 10*3/uL (ref 140–400)
RBC: 5.23 10*6/uL (ref 4.20–5.80)
RDW: 12.7 % (ref 11.0–15.0)
Total Lymphocyte: 32 %
WBC: 6.4 10*3/uL (ref 3.8–10.8)

## 2023-08-23 LAB — LIPID PANEL
Cholesterol: 137 mg/dL (ref <200)
HDL: 42 mg/dL (ref >=40)
LDL Cholesterol (Calc): 81 mg/dL
Non-HDL Cholesterol (Calc): 95 mg/dL (ref <130)
Total CHOL/HDL Ratio: 3.3 (calc) (ref <5.0)
Triglycerides: 60 mg/dL (ref <150)

## 2023-08-23 LAB — COMPLETE METABOLIC PANEL WITH GFR
AG Ratio: 1.2 (calc) (ref 1.0–2.5)
ALT: 15 U/L (ref 9–46)
AST: 16 U/L (ref 10–35)
Albumin: 4.2 g/dL (ref 3.6–5.1)
Alkaline phosphatase (APISO): 88 U/L (ref 35–144)
BUN: 13 mg/dL (ref 7–25)
CO2: 28 mmol/L (ref 20–32)
Calcium: 10.6 mg/dL — ABNORMAL HIGH (ref 8.6–10.3)
Chloride: 106 mmol/L (ref 98–110)
Creat: 0.88 mg/dL (ref 0.70–1.28)
Globulin: 3.6 g/dL (ref 1.9–3.7)
Glucose, Bld: 134 mg/dL — ABNORMAL HIGH (ref 65–99)
Potassium: 4.3 mmol/L (ref 3.5–5.3)
Sodium: 141 mmol/L (ref 135–146)
Total Bilirubin: 0.7 mg/dL (ref 0.2–1.2)
Total Protein: 7.8 g/dL (ref 6.1–8.1)
eGFR: 89 mL/min/{1.73_m2} (ref >=60)

## 2023-08-23 LAB — HEMOGLOBIN A1C
Hgb A1c MFr Bld: 7.3 %{Hb} — ABNORMAL HIGH (ref <5.7)
Mean Plasma Glucose: 163 mg/dL
eAG (mmol/L): 9 mmol/L

## 2023-08-23 LAB — MICROALBUMIN / CREATININE URINE RATIO
Creatinine, Urine: 70 mg/dL (ref 20–320)
Microalb Creat Ratio: 533 mg/g{creat} — ABNORMAL HIGH (ref <30)
Microalb, Ur: 37.3 mg/dL

## 2023-08-26 NOTE — Telephone Encounter (Signed)
Requested medication (s) are due for refill today:   This is not covered by insurance.   Alternative being requested  Requested medication (s) are on the active medication list:   Yes  Future visit scheduled:   Yes   Last ordered: 08/22/2023 by Danelle Berry, PA-C  Returned because an alternative is being requested.   This not covered by insurance.   Requested Prescriptions  Pending Prescriptions Disp Refills   ZEGALOGUE 0.6 MG/0.6ML SOSY [Pharmacy Med Name: ZEGALOGUE 0.6 MG/0.6 ML SYRING]  0     Off-Protocol Failed - 08/26/2023  7:52 AM      Failed - Medication not assigned to a protocol, review manually.      Passed - Valid encounter within last 12 months    Recent Outpatient Visits           4 days ago Type 2 diabetes mellitus with diabetic nephropathy, with long-term current use of insulin [facility])   [place] [facility] Danelle Berry, PA-C   5 months ago Type 2 diabetes mellitus with diabetic nephropathy, with long-term current use of insulin [facility])   [place] [facility] Danelle Berry, PA-C   9 months ago Type 2 diabetes mellitus with diabetic nephropathy, with long-term current use of insulin [facility])   [place] [facility] Danelle Berry, PA-C   9 months ago Type 2 diabetes mellitus with diabetic nephropathy, with long-term current use of insulin [facility])   [place] [facility] Danelle Berry, PA-C   1 year ago Type 2 diabetes mellitus with diabetic nephropathy, with long-term current use of insulin [facility])   [place] [facility] Margarita Mail, DO       Future Appointments             In 4 days McGowan, Elana Alm [facility] [place]   In 2 months Danelle Berry, PA-C [facility]   In 8 months Deirdre Evener, MD [facility]

## 2023-08-26 NOTE — Telephone Encounter (Signed)
PA approved.

## 2023-08-26 NOTE — Telephone Encounter (Signed)
PA submitted.

## 2023-08-30 ENCOUNTER — Ambulatory Visit: Payer: Medicare PPO | Admitting: Urology

## 2023-08-30 ENCOUNTER — Encounter: Payer: Self-pay | Admitting: Urology

## 2023-08-30 VITALS — BP 142/74 | HR 80

## 2023-08-30 DIAGNOSIS — N3943 Post-void dribbling: Secondary | ICD-10-CM | POA: Diagnosis not present

## 2023-08-30 DIAGNOSIS — N3941 Urge incontinence: Secondary | ICD-10-CM

## 2023-08-30 LAB — BLADDER SCAN AMB NON-IMAGING: Scan Result: 34

## 2023-08-30 MED ORDER — TROSPIUM CHLORIDE ER 60 MG PO CP24: 60.0000 mg | ORAL_CAPSULE | Freq: Every day | ORAL | 3 refills | Status: AC

## 2023-08-30 NOTE — Progress Notes (Signed)
08/30/2023 11:01 AM   Edgar [name] July 19, 1946 [id number]  Referring provider: Danelle Berry, PA-C [address] [place],  [place] [address]  Urological history: 1. 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 BPH with LU TS -PSA (08/2022) 2.60 -Myrbetriq not covered by insurance  Chief Complaint  Patient presents with   Follow-up   HPI: Edgar [name] is a 78 y.o. male who presents today for for 6  week follow up after starting Sanctura XL 60 mg daily.  Previous records reviewed.   I PSS 5/1  PVR 34 mL  He states Sanctura XL 60 mg daily is working great.  He no longer has any urinary leakage.  Patient denies any modifying or aggravating factors.  Patient denies any recent UTI's, gross hematuria, dysuria or suprapubic/flank pain.  Patient denies any fevers, chills, nausea or vomiting.     IPSS     Row Name 08/30/23 0900         International Prostate Symptom Score   How often have you had the sensation of not emptying your bladder? Not at All     How often have you had to urinate less than every two hours? Less than half the time     How often have you found you stopped and started again several times when you urinated? Less than half the time     How often have you found it difficult to postpone urination? Not at All     How often have you had a weak urinary stream? Not at All     How often have you had to strain to start urination? Not at All     How many times did you typically get up at night to urinate? 1 Time     Total IPSS Score 5       Quality of Life due to urinary symptoms   If you were to spend the rest of your life with your urinary condition just the way it is now how would you feel about that? Pleased              Score:  1-7 Mild 8-19 Moderate 20-35 Severe    PMH: 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   Closed  intertrochanteric fracture of left hip, sequela 05/30/2016   Coronary artery disease 10/25/2017   CT scan March 2019   Diabetes mellitus without complication (HCC)    Essential hypertension, benign 12/23/2015   History of colonoscopy with polypectomy 12/07/2016   Polypectomy x 12 per patient   Hx of pulmonary embolus 05/18/2016   Hospitalization Sept 2017, following hip fracture   Hx of tobacco use, presenting hazards to health 12/23/2015   Quit prior to 1997   Hx of tobacco use, presenting hazards to health 12/23/2015   Quit prior to 1997    Hyperlipidemia 12/23/2015   Hypertension    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   Obesity 12/23/2015   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   Pulmonary embolism (HCC) 05/18/2016   Hospitalization Sept 2017, following hip fracture   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   left ant lat thigh, SCCis in a SK, schedule EDC   Squamous cell carcinoma of skin 08/14/2021   L ant lat thigh, EDC 09/07/21   Tinea pedis 06/25/2016    Surgical History: Past Surgical History:  Procedure Laterality Date   CIRCUMCISION N/A 03/26/2022   Procedure: CIRCUMCISION ADULT;  Surgeon: Vanna Scotland, MD;  Location: [facility] ORS;  Service: Urology;  Laterality: N/A;   COLONOSCOPY WITH PROPOFOL N/A 01/15/2017   Procedure: COLONOSCOPY WITH PROPOFOL;  Surgeon: Wyline Mood, MD;  Location[facility] ENDOSCOPY;  Service: Endoscopy;  Laterality: N/A;   COLONOSCOPY WITH PROPOFOL N/A 12/17/2019   Procedure: COLONOSCOPY WITH PROPOFOL;  Surgeon: Wyline Mood, MD;  Location: [facility] ENDOSCOPY;  Service: Gastroenterology;  Laterality: N/A;   EYE SURGERY Bilateral    cataract surgery   FRACTURE SURGERY     INTRAMEDULLARY (IM) NAIL INTERTROCHANTERIC Left 04/14/2016   Procedure: INTRAMEDULLARY (IM) NAIL INTERTROCHANTRIC;  Surgeon: Christena Flake, MD;  Location: [facility] ORS;  Service: Orthopedics;  Laterality: Left;   JOINT REPLACEMENT Left 04/2016   TOTAL HIP  ARTHROPLASTY      Home Medications:  Allergies as of 08/30/2023   No Known Allergies      Medication List        Accurate as of August 30, 2023 11:01 AM. If you have any questions, ask your nurse or doctor.          Accu-Chek Guide test strip Generic drug: glucose blood USE IN THE MORNING AT NOON AND AT BEDTIME E11.21   amLODipine 10 MG tablet Commonly known as: NORVASC TAKE 1 TABLET BY MOUTH EVERY DAY   aspirin EC 81 MG tablet Take 81 mg by mouth daily. Swallow whole.   atorvastatin 40 MG tablet Commonly known as: LIPITOR TAKE 1 TABLET BY MOUTH EVERYDAY AT BEDTIME   B-D UF III MINI PEN NEEDLES 31G X 5 MM Misc Generic drug: Insulin Pen Needle Use to inject insulin four times daily   Basaglar KwikPen 100 UNIT/ML DIAL AND INJECT 60 UNITS UNDER THE SKIN AT BEDTIME. MAX DOSE PER DAY 60 UNITS.   Lantus SoloStar 100 UNIT/ML Solostar Pen Generic drug: insulin glargine Inject 50-70 Units into the skin at bedtime.   dapagliflozin propanediol 10 MG Tabs tablet Commonly known as: Farxiga Take 1 tablet (10 mg total) by mouth in the morning.   Gvoke HypoPen 2-Pack 1 MG/0.2ML Soaj Generic drug: Glucagon Inject 1 mg into the skin as needed (for hypoglycemia episodes (glucose below 60 and symptomatic)).   insulin lispro 100 UNIT/ML KwikPen Commonly known as: HumaLOG KwikPen Inject 10 Units into the skin 3 (three) times daily. Patient receives via [employer] Patient Assistance through Dec 2024.   Lancets Misc Use as directed to monitor FSBS up to 4 x a day. Dx: E11.21 Z79.4   losartan 100 MG tablet Commonly known as: COZAAR Take 1 tablet (100 mg total) by mouth daily.   Trospium Chloride 60 MG Cp24 Take 1 capsule (60 mg total) by mouth daily.        Allergies: No Known Allergies  Family History: Family History  Problem Relation Age of Onset   Cancer Mother    Diabetes Mother    Heart disease Father    Alcohol abuse Brother     Social History:  reports  that he quit smoking about 45 years ago. His smoking use included cigarettes. He has never used smokeless tobacco. He reports that he does not drink alcohol and does not use drugs.  ROS: Pertinent ROS in HPI  Physical Exam: BP (!) 142/74   Pulse 80   Constitutional:  Well nourished. Alert and oriented, No acute distress. HEENT: [place] AT, moist mucus membranes.  Trachea midline, no masses. Cardiovascular: No clubbing, cyanosis, or  edema. Respiratory: Normal respiratory effort, no increased work of breathing. Neurologic: Grossly intact, no focal deficits, moving all 4 extremities. Psychiatric: Normal mood and affect.   Laboratory Data: N/A  Pertinent Imaging:  08/30/23 09:54  Scan Result 34 ml    Assessment & Plan:    1. Urge incontinence -at goal with Sanctura XL 60 mg daily  -prescription for Sanctura for 90 days sent to pharmacy -He will return in one year for repeat IPSS and PVR  2. Post void dribbling -resolved with Sanctura XL  Return in about 1 year (around 08/29/2024) for I PSS and PVR .  These notes generated with voice recognition software. I apologize for typographical errors.  Cloretta Ned  [facility] [address]  [address] [place], [place] [address] [phone]

## 2023-09-06 ENCOUNTER — Other Ambulatory Visit: Payer: Self-pay | Admitting: Family Medicine

## 2023-09-06 DIAGNOSIS — E1121 Type 2 diabetes mellitus with diabetic nephropathy: Secondary | ICD-10-CM

## 2023-09-20 ENCOUNTER — Encounter: Payer: Self-pay | Admitting: Podiatry

## 2023-09-20 ENCOUNTER — Ambulatory Visit: Payer: Medicare PPO | Admitting: Podiatry

## 2023-09-20 ENCOUNTER — Ambulatory Visit (INDEPENDENT_AMBULATORY_CARE_PROVIDER_SITE_OTHER): Payer: Medicare PPO

## 2023-09-20 DIAGNOSIS — B351 Tinea unguium: Secondary | ICD-10-CM | POA: Diagnosis not present

## 2023-09-20 DIAGNOSIS — S92405A Nondisplaced unspecified fracture of left great toe, initial encounter for closed fracture: Secondary | ICD-10-CM | POA: Diagnosis not present

## 2023-09-20 DIAGNOSIS — M79675 Pain in left toe(s): Secondary | ICD-10-CM

## 2023-09-20 DIAGNOSIS — M79674 Pain in right toe(s): Secondary | ICD-10-CM

## 2023-09-20 DIAGNOSIS — M2140 Flat foot [pes planus] (acquired), unspecified foot: Secondary | ICD-10-CM

## 2023-09-20 NOTE — Progress Notes (Signed)
Chief Complaint  Patient presents with   Fracture    "It feels good and all."   Diabetes    "I want him to cut my nails and I need some new Diabetic shoes.  These have run over."    HPI: 78 y.o. male presenting today for follow-up evaluation of fracture to the left great toe.  Patient states that he no longer has any pain or tenderness associated to the foot.  He is doing well.  Also presenting today requesting routine diabetic footcare.  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   Closed intertrochanteric fracture of left hip, sequela 05/30/2016   Coronary artery disease 10/25/2017   CT scan March 2019   Diabetes mellitus without complication (HCC)    Essential hypertension, benign 12/23/2015   History of colonoscopy with polypectomy 12/07/2016   Polypectomy x 12 per patient   Hx of pulmonary embolus 05/18/2016   Hospitalization Sept 2017, following hip fracture   Hx of tobacco use, presenting hazards to health 12/23/2015   Quit prior to 1997   Hx of tobacco use, presenting hazards to health 12/23/2015   Quit prior to 1997    Hyperlipidemia 12/23/2015   Hypertension    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   Obesity 12/23/2015   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   Pulmonary embolism (HCC) 05/18/2016   Hospitalization Sept 2017, following hip fracture   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   left ant lat thigh, SCCis in a SK, schedule EDC   Squamous cell carcinoma of skin 08/14/2021   L ant lat thigh, EDC 09/07/21   Tinea pedis 06/25/2016    Past Surgical History:  Procedure Laterality Date   CIRCUMCISION N/A 03/26/2022   Procedure: CIRCUMCISION ADULT;  Surgeon: Vanna Scotland, MD;  Location: [facility] ORS;  Service: Urology;  Laterality: N/A;   COLONOSCOPY WITH PROPOFOL N/A 01/15/2017   Procedure: COLONOSCOPY WITH PROPOFOL;   Surgeon: Wyline Mood, MD;  Location: [facility] ENDOSCOPY;  Service: Endoscopy;  Laterality: N/A;   COLONOSCOPY WITH PROPOFOL N/A 12/17/2019   Procedure: COLONOSCOPY WITH PROPOFOL;  Surgeon: Wyline Mood, MD;  Location: [facility] ENDOSCOPY;  Service: Gastroenterology;  Laterality: N/A;   EYE SURGERY Bilateral    cataract surgery   FRACTURE SURGERY     INTRAMEDULLARY (IM) NAIL INTERTROCHANTERIC Left 04/14/2016   Procedure: INTRAMEDULLARY (IM) NAIL INTERTROCHANTRIC;  Surgeon: Christena Flake, MD;  Location: [facility] ORS;  Service: Orthopedics;  Laterality: Left;   JOINT REPLACEMENT Left 04/2016   TOTAL HIP ARTHROPLASTY      No Known Allergies   Physical Exam: General: The patient is alert and oriented x3 in no acute distress.  Dermatology: Skin is warm, dry and supple bilateral lower extremities.  Hyperkeratotic dystrophic elongated nails noted 1-5 bilateral  Vascular: Palpable pedal pulses bilaterally. Capillary refill within normal limits.  Skin is warm to touch.  Ecchymosis noted around the great toe and the lesser MTPs secondary to the history of injury  Neurological: Diminished via light touch  Musculoskeletal Exam: No pedal deformities noted.  Negative for any significant tenderness to palpation throughout the great toe  Radiographic Exam LT foot 09/20/2023:  Overall improvement.  Moderate degenerative changes noted throughout the pedal joints of the foot.  Acute fracture noted to the distal portion of the proximal phalanx of the left great toe extending into the IPJ.  Minimally displaced. Accessory ossicles also noted throughout the lesser MTPs  Assessment/Plan of Care: 1.  Closed minimally displaced fracture proximal phalanx left great toe; resolved 2.  Pain due to onychomycosis of toenails both 3.  Pes planovalgus deformity bilateral with DJD  -Patient evaluated.  X-rays reviewed - No pain associated to the toe.  Doing well.  Continue wearing diabetic shoes and insoles -Mechanical debridement  of nails 1-5 bilateral performed using a nail nipper without incident or bleeding -Patient is requesting new diabetic shoes and insoles today.  His are over 63-year-old.  New order was placed for diabetic shoes and custom molded Plastizote insoles -Return to clinic with me as needed.  Appointment with orthotics department next available       Felecia Shelling, DPM [facility]  Dr. Felecia Shelling, DPM    [address] [place], [place] [address]                Office [phone]  Fax [phone]

## 2023-09-21 ENCOUNTER — Other Ambulatory Visit: Payer: Self-pay | Admitting: Physician Assistant

## 2023-09-21 DIAGNOSIS — Z794 Long term (current) use of insulin: Secondary | ICD-10-CM

## 2023-09-23 NOTE — Telephone Encounter (Signed)
Resending d/t last was "print"  Requested Prescriptions  Pending Prescriptions Disp Refills   HUMALOG KWIKPEN 100 UNIT/ML KwikPen [Pharmacy Med Name: HumaLOG KwikPen Subcutaneous Solution Pen-injector 100 UNIT/ML] 15 mL 0    Sig: DIAL AND INJECT 10 UNITS UNDER THE SKIN THREE TIMES DAILY.     Endocrinology:  Diabetes - Insulins Passed - 09/23/2023 11:49 AM      Passed - HBA1C is between 0 and 7.9 and within 180 days    Hgb A1c MFr Bld  Date Value Ref Range Status  08/22/2023 7.3 (H) <5.7 % of total Hgb Final    Comment:    For someone without known diabetes, a hemoglobin A1c value of 6.5% or greater indicates that they may have  diabetes and this should be confirmed with a follow-up  test. . For someone with known diabetes, a value <7% indicates  that their diabetes is well controlled and a value  greater than or equal to 7% indicates suboptimal  control. A1c targets should be individualized based on  duration of diabetes, age, comorbid conditions, and  other considerations. . Currently, no consensus exists regarding use of hemoglobin A1c for diagnosis of diabetes for children. Verna Czech - Valid encounter within last 6 months    Recent Outpatient Visits           1 month ago Type 2 diabetes mellitus with diabetic nephropathy, with long-term current use of insulin [facility])   [facility] Danelle Berry, PA-C   6 months ago Type 2 diabetes mellitus with diabetic nephropathy, with long-term current use of insulin [facility])   [facility] Danelle Berry, PA-C   9 months ago Type 2 diabetes mellitus with diabetic nephropathy, with long-term current use of insulin [facility])   [facility] Danelle Berry, PA-C   10 months ago Type 2 diabetes mellitus with diabetic nephropathy, with long-term current use of insulin [facility])   [facility] Danelle Berry, PA-C   1 year ago Type 2 diabetes  mellitus with diabetic nephropathy, with long-term current use of insulin [facility])   [facility] Margarita Mail, DO       Future Appointments             In 1 month Danelle Berry, PA-C [facility]   In 7 months Deirdre Evener, MD [facility]   In 11 months [place], Elana Alm [facility] [place]

## 2023-10-09 ENCOUNTER — Telehealth: Payer: Self-pay

## 2023-10-09 NOTE — Telephone Encounter (Signed)
 Left vm to reschedule appt on 4/18 we are closed that day.

## 2023-10-21 ENCOUNTER — Other Ambulatory Visit: Payer: Self-pay | Admitting: Family Medicine

## 2023-10-21 DIAGNOSIS — I10 Essential (primary) hypertension: Secondary | ICD-10-CM

## 2023-11-08 LAB — HM DIABETES EYE EXAM

## 2023-11-18 ENCOUNTER — Encounter: Payer: Self-pay | Admitting: Ophthalmology

## 2023-11-19 ENCOUNTER — Encounter: Payer: Self-pay | Admitting: Ophthalmology

## 2023-11-19 NOTE — Anesthesia Preprocedure Evaluation (Addendum)
 Anesthesia Evaluation  Patient identified by MRN, date of birth, ID band Patient awake    Reviewed: Allergy & Precautions, H&P , NPO status , Patient's Chart, lab work & pertinent test results  Airway Mallampati: IV  TM Distance: >3 FB Neck ROM: Full  Mouth opening: Limited Mouth Opening  Dental no notable dental hx. (+) Missing Has about 6 or 7 lower teeth, no upper teeth, plans to have all teeth removed and get dentures. Lower teeth slightly loose:   Pulmonary former smoker   Pulmonary exam normal breath sounds clear to auscultation       Cardiovascular hypertension, + CAD  Normal cardiovascular exam Rhythm:Regular Rate:Normal  12-17-22 CT chest IMPRESSION: 1. Stable uncomplicated fusiform aneurysmal dilatation of the ascending thoracic aorta measuring 45 mm (which equals 4.5 cm) in diameter, unchanged compared to the 04/2016 examination. Aortic aneurysm NOS (ICD10-I71.9). 2. Similar findings of borderline cardiomegaly with enlargement of the caliber of the main pulmonary artery, nonspecific though could be seen in the setting of pulmonary arterial hypertension. Further evaluation with cardiac echo could be performed as indicated. 3. Aortic Atherosclerosis (ICD10-I70.0).   Daughter with patient, has POA, states he is having recheck of aneurysm "really soon"   Neuro/Psych negative neurological ROS  negative psych ROS   GI/Hepatic negative GI ROS, Neg liver ROS,,,  Endo/Other  diabetes    Renal/GU Renal diseasenegative Renal ROS  negative genitourinary   Musculoskeletal negative musculoskeletal ROS (+)    Abdominal   Peds negative pediatric ROS (+)  Hematology negative hematology ROS (+)   Anesthesia Other Findings Hypertension  Diabetes mellitus with diabetic nephropathy Essential hypertension, benign Hyperlipidemia Hx of tobacco use, presenting hazards to health  Obesity Pulmonary embolism (HCC)  History  of colonoscopy with polypectomy Microalbuminuria due to type 2 diabetes mellitus  Coronary artery disease Aneurysm of aorta (HCC)  Aneurysm of thoracic aorta (HCC) Closed intertrochanteric fracture of left hip, sequela  Hx of tobacco use, presenting hazards to health 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  Tinea pedis Hx of pulmonary embolus  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   Hypoglycemia in preop holding, received dextrose  50%, 20 ml IV, will recheck  blood sugar  in 1/2  Daughter, Fateh Kindle, has POA, [phone]  Reproductive/Obstetrics negative OB ROS                             Anesthesia Physical Anesthesia Plan  ASA: 3  Anesthesia Plan: MAC   Post-op Pain Management:    Induction: Intravenous  PONV Risk Score and Plan:   Airway Management Planned: Natural Airway and Nasal Cannula  Additional Equipment:   Intra-op Plan:   Post-operative Plan:   Informed Consent: I have reviewed the patients History and Physical, chart, labs and discussed the procedure including the risks, benefits and alternatives for the proposed anesthesia with the patient or authorized representative who has indicated his/her understanding and acceptance.     Dental Advisory Given  Plan Discussed with: Anesthesiologist, CRNA and Surgeon  Anesthesia Plan Comments: (Patient consented for risks of anesthesia including but not limited to:  - adverse reactions to medications - damage to eyes, teeth, lips or other oral mucosa - nerve damage due to positioning  - sore throat or hoarseness - Damage to heart, brain, nerves, lungs, other parts of body or loss of life  Patient voiced understanding and assent.)  Anesthesia Quick Evaluation

## 2023-11-20 ENCOUNTER — Ambulatory Visit: Payer: Self-pay | Admitting: Family Medicine

## 2023-11-22 ENCOUNTER — Other Ambulatory Visit: Payer: Medicare PPO

## 2023-11-25 NOTE — Discharge Instructions (Signed)

## 2023-11-26 ENCOUNTER — Encounter
Admission: RE | Disposition: A | Discharge status: Home / Self Care | Payer: Self-pay | Source: Home / Self Care | Attending: Ophthalmology

## 2023-11-26 ENCOUNTER — Ambulatory Visit: Payer: Self-pay | Admitting: Anesthesiology

## 2023-11-26 ENCOUNTER — Ambulatory Visit
Admission: RE | Admit: 2023-11-26 | Discharge: 2023-11-26 | Disposition: A | Discharge status: Home / Self Care | Attending: Ophthalmology | Admitting: Ophthalmology

## 2023-11-26 ENCOUNTER — Other Ambulatory Visit: Payer: Self-pay

## 2023-11-26 ENCOUNTER — Encounter: Payer: Self-pay | Admitting: Ophthalmology

## 2023-11-26 DIAGNOSIS — E1136 Type 2 diabetes mellitus with diabetic cataract: Secondary | ICD-10-CM | POA: Insufficient documentation

## 2023-11-26 DIAGNOSIS — Z794 Long term (current) use of insulin: Secondary | ICD-10-CM | POA: Diagnosis not present

## 2023-11-26 DIAGNOSIS — Z7984 Long term (current) use of oral hypoglycemic drugs: Secondary | ICD-10-CM | POA: Diagnosis not present

## 2023-11-26 DIAGNOSIS — I10 Essential (primary) hypertension: Secondary | ICD-10-CM | POA: Diagnosis not present

## 2023-11-26 DIAGNOSIS — I2510 Atherosclerotic heart disease of native coronary artery without angina pectoris: Secondary | ICD-10-CM | POA: Diagnosis not present

## 2023-11-26 DIAGNOSIS — Z87891 Personal history of nicotine dependence: Secondary | ICD-10-CM | POA: Insufficient documentation

## 2023-11-26 DIAGNOSIS — H2511 Age-related nuclear cataract, right eye: Secondary | ICD-10-CM | POA: Insufficient documentation

## 2023-11-26 HISTORY — DX: Unspecified hearing loss, unspecified ear: H91.90

## 2023-11-26 HISTORY — DX: Presence of external hearing-aid: Z97.4

## 2023-11-26 HISTORY — DX: Type 2 diabetes mellitus with diabetic nephropathy: E11.21

## 2023-11-26 HISTORY — PX: CATARACT EXTRACTION W/PHACO: SHX586

## 2023-11-26 LAB — GLUCOSE, CAPILLARY
Glucose-Capillary: 73 mg/dL (ref 70–99)
Glucose-Capillary: 123 mg/dL — ABNORMAL HIGH (ref 70–99)
Glucose-Capillary: 105 mg/dL — ABNORMAL HIGH (ref 70–99)

## 2023-11-26 SURGERY — PHACOEMULSIFICATION, CATARACT, WITH IOL INSERTION
Anesthesia: Monitor Anesthesia Care | Site: Eye | Laterality: Right

## 2023-11-26 MED ORDER — LIDOCAINE HCL (PF) 2 % IJ SOLN
INTRAOCULAR | Status: DC | PRN
  Administered 2023-11-26: 2 mL

## 2023-11-26 MED ORDER — TETRACAINE HCL 0.5 % OP SOLN
1.0000 [drp] | OPHTHALMIC | Status: DC | PRN
  Administered 2023-11-26 (×3): 1 [drp] via OPHTHALMIC

## 2023-11-26 MED ORDER — TETRACAINE HCL 0.5 % OP SOLN
OPHTHALMIC | Status: AC
  Filled 2023-11-26: qty 4

## 2023-11-26 MED ORDER — DEXTROSE 50 % IV SOLN
INTRAVENOUS | Status: AC
  Filled 2023-11-26: qty 50

## 2023-11-26 MED ORDER — MOXIFLOXACIN HCL 0.5 % OP SOLN
OPHTHALMIC | Status: DC | PRN
  Administered 2023-11-26: .2 mL via OPHTHALMIC

## 2023-11-26 MED ORDER — MIDAZOLAM HCL 5 MG/5ML IJ SOLN
INTRAMUSCULAR | Status: DC | PRN
  Administered 2023-11-26: 1 mg via INTRAVENOUS

## 2023-11-26 MED ORDER — ARMC OPHTHALMIC DILATING DROPS
1.0000 | OPHTHALMIC | Status: DC | PRN
  Administered 2023-11-26 (×3): 1 via OPHTHALMIC

## 2023-11-26 MED ORDER — MIDAZOLAM HCL 2 MG/2ML IJ SOLN
INTRAMUSCULAR | Status: AC
  Filled 2023-11-26: qty 2

## 2023-11-26 MED ORDER — ARMC OPHTHALMIC DILATING DROPS
OPHTHALMIC | Status: AC
  Filled 2023-11-26: qty 0.5

## 2023-11-26 MED ORDER — DEXTROSE 50 % IV SOLN
20.0000 mL | Freq: Once | INTRAVENOUS | Status: AC
  Administered 2023-11-26: 20 mL via INTRAVENOUS

## 2023-11-26 MED ORDER — SIGHTPATH DOSE#1 BSS IO SOLN
INTRAOCULAR | Status: DC | PRN
Start: 2023-11-26 — End: 2023-11-26
  Administered 2023-11-26: 15 mL via INTRAOCULAR

## 2023-11-26 MED ORDER — SIGHTPATH DOSE#1 NA CHONDROIT SULF-NA HYALURON 40-17 MG/ML IO SOLN
INTRAOCULAR | Status: DC | PRN
  Administered 2023-11-26: 1 mL via INTRAOCULAR

## 2023-11-26 MED ORDER — SIGHTPATH DOSE#1 BSS IO SOLN
INTRAOCULAR | Status: DC | PRN
  Administered 2023-11-26: 44 mL via OPHTHALMIC

## 2023-11-26 MED ORDER — BRIMONIDINE TARTRATE-TIMOLOL 0.2-0.5 % OP SOLN
OPHTHALMIC | Status: DC | PRN
Start: 2023-11-26 — End: 2023-11-26
  Administered 2023-11-26: 1 [drp] via OPHTHALMIC

## 2023-11-26 MED ORDER — FENTANYL CITRATE (PF) 100 MCG/2ML IJ SOLN
INTRAMUSCULAR | Status: DC | PRN
  Administered 2023-11-26: 50 ug via INTRAVENOUS

## 2023-11-26 MED ORDER — FENTANYL CITRATE (PF) 100 MCG/2ML IJ SOLN
INTRAMUSCULAR | Status: AC
  Filled 2023-11-26: qty 2

## 2023-11-26 SURGICAL SUPPLY — 13 items
CATARACT SUITE SIGHTPATH (MISCELLANEOUS) ×1 IMPLANT
CYSTOTOME ANGL RVRS SHRT 25G (CUTTER) ×1 IMPLANT
CYSTOTOME ANGL RVRS SHRT 25GA (CUTTER) ×1 IMPLANT
FEE CATARACT SUITE SIGHTPATH (MISCELLANEOUS) ×1 IMPLANT
GLOVE BIOGEL PI IND STRL 8 (GLOVE) ×1 IMPLANT
GLOVE SURG LX STRL 8.0 MICRO (GLOVE) ×1 IMPLANT
GLOVE SURG PROTEXIS BL SZ6.5 (GLOVE) ×1 IMPLANT
GLOVE SURG SYN 6.5 PF PI BL (GLOVE) ×1 IMPLANT
LENS CLAREON 20.5 (Intraocular Lens) ×1 IMPLANT
LENS IOL CLRN 20.5 (Intraocular Lens) IMPLANT
NDL FILTER BLUNT 18X1 1/2 (NEEDLE) ×1 IMPLANT
NEEDLE FILTER BLUNT 18X1 1/2 (NEEDLE) ×1 IMPLANT
SYR 3ML LL SCALE MARK (SYRINGE) ×1 IMPLANT

## 2023-11-26 NOTE — H&P (Signed)
 [facility]   Primary Care Physician:  Adeline Hone, PA-C Ophthalmologist: Dr. Jeb Miner  Pre-Procedure History & Physical: HPI:  Edgar [name] is a 78 y.o. male here for cataract surgery.   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   Closed intertrochanteric fracture of left hip, sequela 05/30/2016   Coronary artery disease 10/25/2017   CT scan March 2019   Diabetes mellitus with diabetic nephropathy (HCC)    Diabetes mellitus without complication (HCC)    Essential hypertension, benign 12/23/2015   History of colonoscopy with polypectomy 12/07/2016   Polypectomy x 12 per patient   HOH (hard of hearing)    Hx of pulmonary embolus 05/18/2016   Hospitalization Sept 2017, following hip fracture   Hx of tobacco use, presenting hazards to health 12/23/2015   Quit prior to 1997   Hx of tobacco use, presenting hazards to health 12/23/2015   Quit prior to 1997    Hyperlipidemia 12/23/2015   Hypertension    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   Obesity 12/23/2015   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   Pulmonary embolism (HCC) 05/18/2016   Hospitalization Sept 2017, following hip fracture   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   left ant lat thigh, SCCis in a SK, schedule EDC   Squamous cell carcinoma of skin 08/14/2021   L ant lat thigh, EDC 09/07/21   Tinea pedis 06/25/2016   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     Past Surgical History:  Procedure Laterality Date   CIRCUMCISION N/A 03/26/2022   Procedure: CIRCUMCISION ADULT;  Surgeon: Dustin Gimenez, MD;  Location: [facility] ORS;  Service: Urology;  Laterality: N/A;   COLONOSCOPY WITH PROPOFOL  N/A 01/15/2017   Procedure: COLONOSCOPY WITH PROPOFOL ;  Surgeon: Luke Salaam, MD;  Location: [facility] ENDOSCOPY;  Service: Endoscopy;  Laterality: N/A;   COLONOSCOPY WITH PROPOFOL  N/A 12/17/2019    Procedure: COLONOSCOPY WITH PROPOFOL ;  Surgeon: Luke Salaam, MD;  Location: [facility] ENDOSCOPY;  Service: Gastroenterology;  Laterality: N/A;   EYE SURGERY Bilateral    cataract surgery   FRACTURE SURGERY     INTRAMEDULLARY (IM) NAIL INTERTROCHANTERIC Left 04/14/2016   Procedure: INTRAMEDULLARY (IM) NAIL INTERTROCHANTRIC;  Surgeon: Elner Hahn, MD;  Location: [facility] ORS;  Service: Orthopedics;  Laterality: Left;   JOINT REPLACEMENT Left 04/2016   TOTAL HIP ARTHROPLASTY      Prior to Admission medications   Medication Sig Start Date End Date Taking? Authorizing Provider  amLODipine  (NORVASC ) 10 MG tablet TAKE 1 TABLET BY MOUTH EVERY DAY 10/21/23  Yes Tapia, Leisa, PA-C  aspirin  EC 81 MG tablet Take 81 mg by mouth daily. Swallow whole.   Yes [provider]  atorvastatin  (LIPITOR) 40 MG tablet TAKE 1 TABLET BY MOUTH EVERYDAY AT BEDTIME 08/22/23  Yes Tapia, Leisa, PA-C  dapagliflozin  propanediol (FARXIGA ) 10 MG TABS tablet Take 1 tablet (10 mg total) by mouth in the morning. 08/22/23  Yes Tapia, Leisa, PA-C  insulin  glargine (LANTUS  SOLOSTAR) 100 UNIT/ML Solostar Pen Inject 50-70 Units into the skin at bedtime. Patient taking differently: Inject 50-70 Units into the skin at bedtime. Using 60 units 08/22/23  Yes Tapia, Leisa, PA-C  insulin  lispro (HUMALOG  KWIKPEN) 100 UNIT/ML KwikPen DIAL  AND INJECT 10 UNITS UNDER THE SKIN THREE TIMES DAILY. 09/23/23  Yes Tapia, Leisa, PA-C  Lancets MISC Use as directed to monitor FSBS up  to 4 x a day. Dx: E11.21 Z79.4 04/23/22  Yes Tapia, Leisa, PA-C  losartan  (COZAAR ) 100 MG tablet Take 1 tablet (100 mg total) by mouth daily. 08/22/23  Yes Tapia, Leisa, PA-C  Trospium  Chloride 60 MG CP24 Take 1 capsule (60 mg total) by mouth daily. 08/30/23  Yes McGowan, Cathleen Coach A, PA-C  ACCU-CHEK GUIDE TEST test strip USE IN THE MORNING AT [facility] AND AT BEDTIME E11.21 09/06/23   Tapia, Leisa, PA-C  GVOKE HYPOPEN  2-PACK 1 MG/0.2ML SOAJ Inject 1 mg into the skin as needed (for  hypoglycemia episodes (glucose below 60 and symptomatic)). 08/22/23   Tapia, Leisa, PA-C  Insulin  Pen Needle (B-D UF III MINI PEN NEEDLES) 31G X 5 MM MISC Use to inject insulin  four times daily 08/22/23   Tapia, Leisa, PA-C    Allergies as of 11/11/2023   (No Known Allergies)    Family History  Problem Relation Age of Onset   Cancer Mother    Diabetes Mother    Heart disease Father    Alcohol abuse Brother     Social History   Socioeconomic History   Marital status: Widowed    Spouse name: Not on file   Number of children: Not on file   Years of education: Not on file   Highest education level: GED or equivalent  Occupational History   Not on file  Tobacco Use   Smoking status: Former    Current packs/day: 0.00    Types: Cigarettes    Quit date: 09/01/1978    Years since quitting: 45.2   Smokeless tobacco: Never  Vaping Use   Vaping status: Never Used  Substance and Sexual Activity   Alcohol use: No    Alcohol/week: 0.0 standard drinks of alcohol   Drug use: No   Sexual activity: Not Currently  Other Topics Concern   Not on file  Social History Narrative   Pt's daughter lives with him   Social Drivers of Health   Financial Resource Strain: Patient Declined (08/21/2023)   Overall Financial Resource Strain (CARDIA)    Difficulty of Paying Living Expenses: Patient declined  Food Insecurity: Patient Declined (08/21/2023)   Hunger Vital Sign    Worried About Running Out of Food in the Last Year: Patient declined    Ran Out of Food in the Last Year: Patient declined  Transportation Needs: No Transportation Needs (08/21/2023)   PRAPARE - Administrator, Civil Service (Medical): No    Lack of Transportation (Non-Medical): No  Physical Activity: Unknown (08/21/2023)   Exercise Vital Sign    Days of Exercise per Week: Patient declined    Minutes of Exercise per Session: 30 min  Stress: No Stress Concern Present (08/21/2023)   [employer] of Occupational  Health - Occupational Stress Questionnaire    Feeling of Stress : Not at all  Social Connections: Moderately Isolated (08/21/2023)   Social Connection and Isolation Panel [NHANES]    Frequency of Communication with Friends and Family: More than three times a week    Frequency of Social Gatherings with Friends and Family: Once a week    Attends Religious Services: More than 4 times per year    Active Member of [employer] or Organizations: No    Attends Banker Meetings: Never    Marital Status: Widowed  Intimate Partner Violence: Not At Risk (04/12/2023)   Humiliation, Afraid, Rape, and Kick questionnaire    Fear of Current or Ex-Partner: No    Emotionally Abused:  No    Physically Abused: No    Sexually Abused: No    Review of Systems: See HPI, otherwise negative ROS  Physical Exam: BP 115/78   Pulse 61   Temp 97.7 F (36.5 C) (Temporal)   Resp 15   Ht 6' 0.01" (1.829 m)   Wt 121.1 kg   SpO2 98%   BMI 36.20 kg/m  General:   Alert, cooperative. Head:  Normocephalic and atraumatic. Respiratory:  Normal work of breathing. Cardiovascular:  NAD  Impression/Plan: Edgar [name] is here for cataract surgery.  Risks, benefits, limitations, and alternatives regarding cataract surgery have been reviewed with the patient.  Questions have been answered.  All parties agreeable.   Clair Crews, MD  11/26/2023, 10:33 AM

## 2023-11-26 NOTE — Op Note (Signed)
 PREOPERATIVE DIAGNOSIS:  Nuclear sclerotic cataract of the right eye.   POSTOPERATIVE DIAGNOSIS:  Right Eye Cataract   OPERATIVE PROCEDURE:ORPROCALL@   SURGEON:  Clair Crews, MD.   ANESTHESIA:  Anesthesiologist: Emilie Harden, MD CRNA: Mertie Abt, CRNA; Bill Budd, CRNA  1.      Managed anesthesia care. 2.      0.75ml of Shugarcaine was instilled in the eye following the paracentesis.   COMPLICATIONS:  None.   TECHNIQUE:   Stop and chop   DESCRIPTION OF PROCEDURE:  The patient was examined and consented in the preoperative holding area where the aforementioned topical anesthesia was applied to the right eye and then brought back to the Operating Room where the right eye was prepped and draped in the usual sterile ophthalmic fashion and a lid speculum was placed. A paracentesis was created with the side port blade and the anterior chamber was filled with viscoelastic. A near clear corneal incision was performed with the steel keratome. A continuous curvilinear capsulorrhexis was performed with a cystotome followed by the capsulorrhexis forceps. Hydrodissection and hydrodelineation were carried out with BSS on a blunt cannula. The lens was removed in a stop and chop  technique and the remaining cortical material was removed with the irrigation-aspiration handpiece. The capsular bag was inflated with viscoelastic and the Technis [id number]  lens was placed in the capsular bag without complication. The remaining viscoelastic was removed from the eye with the irrigation-aspiration handpiece. The wounds were hydrated. The anterior chamber was flushed with BSS and the eye was inflated to physiologic pressure. 0.40ml of Vigamox  was placed in the anterior chamber. The wounds were found to be water tight. The eye was dressed with Combigan . The patient was given protective glasses to wear throughout the day and a shield with which to sleep tonight. The patient was also given drops with which to  begin a drop regimen today and will follow-up with me in one day. Implant Name Type Inv. Item Serial No. Manufacturer Lot No. LRB No. Used Action  LENS CLAREON 20.5 - [id number] Intraocular Lens LENS CLAREON 20.5 [id number] SIGHTPATH  Right 1 Implanted   Procedure(s): PHACOEMULSIFICATION, CATARACT, WITH IOL INSERTION 6.85 00:43.9 (Right)  Electronically signed: Clair Crews 11/26/2023 11:00 AM

## 2023-11-26 NOTE — Transfer of Care (Signed)
 Immediate Anesthesia Transfer of Care Note  Patient: Edgar [name]  Procedure(s) Performed: PHACOEMULSIFICATION, CATARACT, WITH IOL INSERTION 6.85 00:43.9 (Right: Eye)  Patient Location: PACU  Anesthesia Type: MAC  Level of Consciousness: awake, alert  and patient cooperative  Airway and Oxygen Therapy: Patient Spontanous Breathing and Patient connected to supplemental oxygen  Post-op Assessment: Post-op Vital signs reviewed, Patient's Cardiovascular Status Stable, Respiratory Function Stable, Patent Airway and No signs of Nausea or vomiting  Post-op Vital Signs: Reviewed and stable  Complications: No notable events documented.

## 2023-11-26 NOTE — Anesthesia Postprocedure Evaluation (Signed)
 Anesthesia Post Note  Patient: Edgar [name]  Procedure(s) Performed: PHACOEMULSIFICATION, CATARACT, WITH IOL INSERTION 6.85 00:43.9 (Right: Eye)  Patient location during evaluation: PACU Anesthesia Type: MAC Level of consciousness: awake and alert Pain management: pain level controlled Vital Signs Assessment: post-procedure vital signs reviewed and stable Respiratory status: spontaneous breathing, nonlabored ventilation, respiratory function stable and patient connected to nasal cannula oxygen Cardiovascular status: stable and blood pressure returned to baseline Postop Assessment: no apparent nausea or vomiting Anesthetic complications: no   No notable events documented.   Last Vitals:  Vitals:   11/26/23 1105 11/26/23 1110  BP: 104/63 105/70  Pulse: 60 (!) 59  Resp: 12 (!) 23  Temp: (!) 36.2 C (!) 36.2 C  SpO2: 96% 95%    Last Pain:  Vitals:   11/26/23 1110  TempSrc:   PainSc: 0-No pain                 Kiaraliz Rafuse C Jazlen Ogarro

## 2023-12-04 ENCOUNTER — Ambulatory Visit (INDEPENDENT_AMBULATORY_CARE_PROVIDER_SITE_OTHER): Admitting: Family Medicine

## 2023-12-04 ENCOUNTER — Other Ambulatory Visit: Payer: Self-pay

## 2023-12-04 ENCOUNTER — Encounter: Payer: Self-pay | Admitting: Family Medicine

## 2023-12-04 VITALS — BP 126/72 | HR 72 | Temp 98.4°F | Resp 16 | Ht 72.0 in | Wt 268.1 lb

## 2023-12-04 DIAGNOSIS — Z794 Long term (current) use of insulin: Secondary | ICD-10-CM

## 2023-12-04 DIAGNOSIS — E1121 Type 2 diabetes mellitus with diabetic nephropathy: Secondary | ICD-10-CM | POA: Diagnosis not present

## 2023-12-04 DIAGNOSIS — I10 Essential (primary) hypertension: Secondary | ICD-10-CM | POA: Diagnosis not present

## 2023-12-04 DIAGNOSIS — I2510 Atherosclerotic heart disease of native coronary artery without angina pectoris: Secondary | ICD-10-CM | POA: Diagnosis not present

## 2023-12-04 DIAGNOSIS — E782 Mixed hyperlipidemia: Secondary | ICD-10-CM | POA: Diagnosis not present

## 2023-12-04 DIAGNOSIS — E1159 Type 2 diabetes mellitus with other circulatory complications: Secondary | ICD-10-CM

## 2023-12-04 DIAGNOSIS — I152 Hypertension secondary to endocrine disorders: Secondary | ICD-10-CM

## 2023-12-04 LAB — POCT GLYCOSYLATED HEMOGLOBIN (HGB A1C): Hemoglobin A1C: 6.5 % — AB (ref 4.0–5.6)

## 2023-12-04 MED ORDER — LOSARTAN POTASSIUM 100 MG PO TABS: 100.0000 mg | ORAL_TABLET | Freq: Every day | ORAL | 1 refills | Status: DC

## 2023-12-04 MED ORDER — ATORVASTATIN CALCIUM 40 MG PO TABS: ORAL_TABLET | ORAL | 1 refills | Status: DC

## 2023-12-04 MED ORDER — AMLODIPINE BESYLATE 10 MG PO TABS: 10.0000 mg | ORAL_TABLET | Freq: Every day | ORAL | 0 refills | Status: DC

## 2023-12-04 MED ORDER — DAPAGLIFLOZIN PROPANEDIOL 10 MG PO TABS: 10.0000 mg | ORAL_TABLET | Freq: Every morning | ORAL | 5 refills | Status: DC

## 2023-12-04 NOTE — Progress Notes (Signed)
 Name: Edgar [name]   MRN: [id number]    DOB: 1946-08-06   Date:12/04/2023       Progress Note  Chief Complaint  Patient presents with   Medical Management of Chronic Issues    3 month recheck     Subjective:   Edgar [name] is a 78 y.o. male, presents to clinic for routine f/up and med refill  Pt here for routine f/up IDDM, HLD, HTN No med changes or concerns today  POCA1C was done and at goal - he is here alone today Labs done last ov so POC was done today No low blood sugar episodes  Insulin  and farxiga  previously through assistance program with [facility]  Past HPI - no changes DM:   Pt managing DM with farxiga , lantus  65 units  night and humalog  10 TID Denies: Polyuria, polydipsia, vision changes, neuropathy, hypoglycemia Reviewed his gluclose book he brought in with him. Recent pertinent labs: Lab Results  Component Value Date   HGBA1C 6.5 (A) 12/04/2023   HGBA1C 7.3 (H) 08/22/2023   HGBA1C 6.7 (H) 03/13/2023   Standard of care and health maintenance: Urine Microalbumin:  done Foot exam: done last OV DM eye exam:  due april ACEI/ARB:  yes - losartan  Statin:  yes lipitor   Hyperlipidemia: labs checked last OV Currently treated with lipitor, pt reports good med compliance Last Lipids: Lab Results  Component Value Date   CHOL 137 08/22/2023   HDL 42 08/22/2023   LDLCALC 81 08/22/2023   TRIG 60 08/22/2023   CHOLHDL 3.3 08/22/2023   - Denies: Chest pain, shortness of breath, myalgias, claudication  Hypertension: BP at goal today Currently managed on losartan  Pt reports good med compliance and denies any SE.   Blood pressure today is well controlled. BP Readings from Last 3 Encounters:  12/04/23 126/72  11/26/23 105/70  08/30/23 (!) 142/74        Current Outpatient Medications:    amLODipine  (NORVASC ) 10 MG tablet, TAKE 1 TABLET BY MOUTH EVERY DAY, Disp: 90 tablet, Rfl: 0   aspirin  EC 81 MG tablet, Take 81 mg by mouth daily. Swallow whole.,  Disp: , Rfl:    atorvastatin  (LIPITOR) 40 MG tablet, TAKE 1 TABLET BY MOUTH EVERYDAY AT BEDTIME, Disp: 90 tablet, Rfl: 1   dapagliflozin  propanediol (FARXIGA ) 10 MG TABS tablet, Take 1 tablet (10 mg total) by mouth in the morning., Disp: 90 tablet, Rfl: 1   GVOKE HYPOPEN  2-PACK 1 MG/0.2ML SOAJ, Inject 1 mg into the skin as needed (for hypoglycemia episodes (glucose below 60 and symptomatic))., Disp: 0.4 mL, Rfl: 1   insulin  glargine (LANTUS  SOLOSTAR) 100 UNIT/ML Solostar Pen, Inject 50-70 Units into the skin at bedtime. (Patient taking differently: Inject 50-70 Units into the skin at bedtime. Using 60 units), Disp: 30 mL, Rfl: 5   insulin  lispro (HUMALOG  KWIKPEN) 100 UNIT/ML KwikPen, DIAL  AND INJECT 10 UNITS UNDER THE SKIN THREE TIMES DAILY., Disp: 15 mL, Rfl: 2   losartan  (COZAAR ) 100 MG tablet, Take 1 tablet (100 mg total) by mouth daily., Disp: 90 tablet, Rfl: 1   Trospium  Chloride 60 MG CP24, Take 1 capsule (60 mg total) by mouth daily., Disp: 90 capsule, Rfl: 3   ACCU-CHEK GUIDE TEST test strip, USE IN THE MORNING AT NOON AND AT BEDTIME E11.21 (Patient not taking: Reported on 12/04/2023), Disp: 100 strip, Rfl: 6   Insulin  Pen Needle (B-D UF III MINI PEN NEEDLES) 31G X 5 MM MISC, Use to inject insulin  four  times daily (Patient not taking: Reported on 12/04/2023), Disp: 200 each, Rfl: 11   Lancets MISC, Use as directed to monitor FSBS up to 4 x a day. Dx: E11.21 Z79.4 (Patient not taking: Reported on 12/04/2023), Disp: 100 each, Rfl: 11  Patient Active Problem List   Diagnosis Date Noted   Class 2 severe obesity with serious comorbidity and body mass index (BMI) of 36.0 to 36.9 in adult [facility]) 08/22/2023   Lower urinary tract symptoms (LUTS) 11/29/2022   Urine test positive for microalbuminuria 11/29/2022   Hypertension associated with type 2 diabetes mellitus (HCC) 12/21/2021   Acquired trigger finger of right index finger 12/30/2020   Aortic atherosclerosis (HCC) 08/01/2020   Coronary artery  disease 10/25/2017   Aneurysm of thoracic aorta (HCC) 09/24/2017   History of colonoscopy with polypectomy 12/07/2016   Fatty liver 09/11/2016   Essential hypertension, benign 12/23/2015   Mixed hyperlipidemia 12/23/2015   Morbid obesity (HCC) 12/23/2015   Type 2 diabetes mellitus with diabetic nephropathy, with long-term current use of insulin  (HCC) 01/25/2015    Past Surgical History:  Procedure Laterality Date   CATARACT EXTRACTION W/PHACO Right 11/26/2023   Procedure: PHACOEMULSIFICATION, CATARACT, WITH IOL INSERTION 6.85 00:43.9;  Surgeon: Clair Crews, MD;  Location[facility];  Service: Ophthalmology;  Laterality: Right;   CIRCUMCISION N/A 03/26/2022   Procedure: CIRCUMCISION ADULT;  Surgeon: Dustin Gimenez, MD;  Location: [facility] ORS;  Service: Urology;  Laterality: N/A;   COLONOSCOPY WITH PROPOFOL  N/A 01/15/2017   Procedure: COLONOSCOPY WITH PROPOFOL ;  Surgeon: Luke Salaam, MD;  Location: [facility] ENDOSCOPY;  Service: Endoscopy;  Laterality: N/A;   COLONOSCOPY WITH PROPOFOL  N/A 12/17/2019   Procedure: COLONOSCOPY WITH PROPOFOL ;  Surgeon: Luke Salaam, MD;  Location: [facility] ENDOSCOPY;  Service: Gastroenterology;  Laterality: N/A;   EYE SURGERY Bilateral    cataract surgery   FRACTURE SURGERY     INTRAMEDULLARY (IM) NAIL INTERTROCHANTERIC Left 04/14/2016   Procedure: INTRAMEDULLARY (IM) NAIL INTERTROCHANTRIC;  Surgeon: Elner Hahn, MD;  Location: [facility] ORS;  Service: Orthopedics;  Laterality: Left;   JOINT REPLACEMENT Left 04/2016   TOTAL HIP ARTHROPLASTY      Family History  Problem Relation Age of Onset   Cancer Mother    Diabetes Mother    Heart disease Father    Alcohol abuse Brother     Social History   Tobacco Use   Smoking status: Former    Current packs/day: 0.00    Types: Cigarettes    Quit date: 09/01/1978    Years since quitting: 45.2   Smokeless tobacco: Never  Vaping Use   Vaping status: Never Used  Substance Use Topics   Alcohol use: No     Alcohol/week: 0.0 standard drinks of alcohol   Drug use: No     No Known Allergies  Health Maintenance  Topic Date Due   Zoster Vaccines- Shingrix  (1 of 2) 01/19/1966   COVID-19 Vaccine (4 - 2024-25 season) 04/07/2023   DTaP/Tdap/Td (2 - Tdap) 08/21/2024 (Originally 05/14/2019)   INFLUENZA VACCINE  03/06/2024   Medicare Annual Wellness (AWV)  04/11/2024   HEMOGLOBIN A1C  06/04/2024   Diabetic kidney evaluation - eGFR measurement  08/21/2024   Diabetic kidney evaluation - Urine ACR  08/21/2024   FOOT EXAM  08/21/2024   OPHTHALMOLOGY EXAM  11/07/2024   Colonoscopy  12/17/2024   Pneumonia Vaccine 71+ Years old  Completed   Hepatitis C Screening  Completed   HPV VACCINES  Aged Out   Meningococcal B Vaccine  Aged Out    Chart Review Today: I personally reviewed active problem list, medication list, allergies, family history, social history, health maintenance, notes from last encounter, lab results, imaging with the patient/caregiver today.   Review of Systems  Constitutional: Negative.   HENT: Negative.    Eyes: Negative.   Respiratory: Negative.    Cardiovascular: Negative.   Gastrointestinal: Negative.   Endocrine: Negative.   Genitourinary: Negative.   Musculoskeletal: Negative.   Skin: Negative.   Allergic/Immunologic: Negative.   Neurological: Negative.   Hematological: Negative.   Psychiatric/Behavioral: Negative.    All other systems reviewed and are negative.    Objective:   Vitals:   12/04/23 0918  BP: 126/72  Pulse: 72  Resp: 16  Temp: 98.4 F (36.9 C)  TempSrc: Oral  SpO2: 96%  Weight: 268 lb 1.6 oz (121.6 kg)  Height: 6' (1.829 m)    Body mass index is 36.36 kg/m.  Physical Exam Vitals and nursing note reviewed.  Constitutional:      General: He is not in acute distress.    Appearance: He is well-developed. He is obese. He is not ill-appearing, toxic-appearing or diaphoretic.  HENT:     Head: Normocephalic and atraumatic.     Nose: Nose  normal.  Eyes:     General:        Right eye: No discharge.        Left eye: No discharge.     Conjunctiva/sclera: Conjunctivae normal.  Neck:     Trachea: No tracheal deviation.  Cardiovascular:     Rate and Rhythm: Normal rate and regular rhythm.     Pulses: Normal pulses.     Heart sounds: Normal heart sounds.  Pulmonary:     Effort: Pulmonary effort is normal. No respiratory distress.     Breath sounds: Normal breath sounds. No stridor.  Musculoskeletal:     Right lower leg: Edema present.     Left lower leg: Edema present.  Skin:    General: Skin is warm and dry.     Findings: No rash.  Neurological:     Mental Status: He is alert.     Motor: No abnormal muscle tone.     Coordination: Coordination normal.  Psychiatric:        Mood and Affect: Mood normal.        Behavior: Behavior normal.         Assessment & Plan:      ICD-10-CM   1. Type 2 diabetes mellitus with diabetic nephropathy, with long-term current use of insulin  (HCC)  E11.21 POCT HgB A1C   Z79.4    well controlled, A1c at goal, on statin/ARB, utd on all other screening except was due for DM eye exam this month    2. Essential hypertension, benign  I10 amLODipine  (NORVASC ) 10 MG tablet   stable, well controlled and at goal, no med changes    3. Mixed hyperlipidemia  E78.2 atorvastatin  (LIPITOR) 40 MG tablet   on lipitor, good compliance, no SE or concerns, recent labs done and at goal    4. Coronary artery disease involving native coronary artery of native heart without angina pectoris  I25.10 atorvastatin  (LIPITOR) 40 MG tablet   no current exertional sx, pt IDDM, HTN and HLD well controlled, contiue monitoring    5. Hypertension associated with type 2 diabetes mellitus (HCC)  E11.59 losartan  (COZAAR ) 100 MG tablet   I15.2    see #2  Return in about 6 months (around 06/04/2024) for Routine follow-up.   Adeline Hone, PA-C 12/04/23 9:45 AM

## 2023-12-06 ENCOUNTER — Other Ambulatory Visit

## 2023-12-06 ENCOUNTER — Telehealth (INDEPENDENT_AMBULATORY_CARE_PROVIDER_SITE_OTHER): Payer: Self-pay | Admitting: Vascular Surgery

## 2023-12-06 NOTE — Telephone Encounter (Signed)
 LVM for pt's daughter to call to the office to make a CT results - see Dr. Vonna Guardian.

## 2023-12-09 ENCOUNTER — Telehealth (INDEPENDENT_AMBULATORY_CARE_PROVIDER_SITE_OTHER): Payer: Self-pay | Admitting: Vascular Surgery

## 2023-12-09 NOTE — Telephone Encounter (Signed)
 LVM for pt TCB and schedule a CT results - see JD.

## 2023-12-24 ENCOUNTER — Ambulatory Visit
Admission: RE | Admit: 2023-12-24 | Discharge: 2023-12-24 | Disposition: A | Discharge status: Home / Self Care | Source: Ambulatory Visit | Attending: Vascular Surgery | Admitting: Vascular Surgery

## 2023-12-24 ENCOUNTER — Encounter (INDEPENDENT_AMBULATORY_CARE_PROVIDER_SITE_OTHER): Payer: Self-pay

## 2023-12-24 DIAGNOSIS — I7121 Aneurysm of the ascending aorta, without rupture: Secondary | ICD-10-CM | POA: Diagnosis present

## 2023-12-24 LAB — POCT I-STAT CREATININE: Creatinine, Ser: 1.2 mg/dL (ref 0.61–1.24)

## 2023-12-24 MED ORDER — IOHEXOL 350 MG/ML SOLN
75.0000 mL | Freq: Once | INTRAVENOUS | Status: AC | PRN
Start: 2023-12-24 — End: 2023-12-24
  Administered 2023-12-24: 75 mL via INTRAVENOUS

## 2023-12-31 ENCOUNTER — Ambulatory Visit (INDEPENDENT_AMBULATORY_CARE_PROVIDER_SITE_OTHER): Admitting: Vascular Surgery

## 2023-12-31 ENCOUNTER — Encounter (INDEPENDENT_AMBULATORY_CARE_PROVIDER_SITE_OTHER): Payer: Self-pay | Admitting: Vascular Surgery

## 2023-12-31 VITALS — BP 112/70 | HR 74 | Resp 20 | Ht 72.0 in | Wt 273.0 lb

## 2023-12-31 DIAGNOSIS — I10 Essential (primary) hypertension: Secondary | ICD-10-CM

## 2023-12-31 DIAGNOSIS — E1121 Type 2 diabetes mellitus with diabetic nephropathy: Secondary | ICD-10-CM | POA: Diagnosis not present

## 2023-12-31 DIAGNOSIS — Z794 Long term (current) use of insulin: Secondary | ICD-10-CM

## 2023-12-31 DIAGNOSIS — I7121 Aneurysm of the ascending aorta, without rupture: Secondary | ICD-10-CM | POA: Diagnosis not present

## 2023-12-31 NOTE — Progress Notes (Signed)
 MRN : [id number]  Edgar [name] is a 78 y.o. ([birth year]) male who presents with chief complaint of  Chief Complaint  Patient presents with   Follow-up    CT results  .  History of Present Illness: Patient returns today in follow up of his thoracic aortic aneurysm.  He is doing well.  He denies any current complaints today.  He denies any aneurysm related symptoms.  Specifically, not having any chest pain, upper back pain, or signs of peripheral embolization.  I have independently reviewed his recent CT scan.  His ascending thoracic aorta is stable at 4.5 cm in maximal diameter.  He has mild dilatation of the proximal descending thoracic aorta to 4.0 cm but no significant change from previous studies.  Current Outpatient Medications  Medication Sig Dispense Refill   amLODipine  (NORVASC ) 10 MG tablet Take 1 tablet (10 mg total) by mouth daily. 90 tablet 0   aspirin  EC 81 MG tablet Take 81 mg by mouth daily. Swallow whole.     atorvastatin  (LIPITOR) 40 MG tablet TAKE 1 TABLET BY MOUTH EVERYDAY AT BEDTIME 90 tablet 1   dapagliflozin  propanediol (FARXIGA ) 10 MG TABS tablet Take 1 tablet (10 mg total) by mouth in the morning. 30 tablet 5   GVOKE HYPOPEN  2-PACK 1 MG/0.2ML SOAJ Inject 1 mg into the skin as needed (for hypoglycemia episodes (glucose below 60 and symptomatic)). 0.4 mL 1   insulin  glargine (LANTUS  SOLOSTAR) 100 UNIT/ML Solostar Pen Inject 50-70 Units into the skin at bedtime. (Patient taking differently: Inject 50-70 Units into the skin at bedtime. Using 60 units) 30 mL 5   insulin  lispro (HUMALOG  KWIKPEN) 100 UNIT/ML KwikPen DIAL  AND INJECT 10 UNITS UNDER THE SKIN THREE TIMES DAILY. 15 mL 2   losartan  (COZAAR ) 100 MG tablet Take 1 tablet (100 mg total) by mouth daily. 90 tablet 1   Trospium  Chloride 60 MG CP24 Take 1 capsule (60 mg total) by mouth daily. 90 capsule 3   ACCU-CHEK GUIDE TEST test strip USE IN THE MORNING AT NOON AND AT BEDTIME E11.21 (Patient not taking: Reported on  12/31/2023) 100 strip 6   Insulin  Pen Needle (B-D UF III MINI PEN NEEDLES) 31G X 5 MM MISC Use to inject insulin  four times daily (Patient not taking: Reported on 12/31/2023) 200 each 11   Lancets MISC Use as directed to monitor FSBS up to 4 x a day. Dx: E11.21 Z79.4 (Patient not taking: Reported on 12/31/2023) 100 each 11   No current facility-administered medications for this visit.    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   Closed intertrochanteric fracture of left hip, sequela 05/30/2016   Coronary artery disease 10/25/2017   CT scan March 2019   Diabetes mellitus with diabetic nephropathy (HCC)    Diabetes mellitus without complication (HCC)    Essential hypertension, benign 12/23/2015   History of colonoscopy with polypectomy 12/07/2016   Polypectomy x 12 per patient   HOH (hard of hearing)    Hx of pulmonary embolus 05/18/2016   Hospitalization Sept 2017, following hip fracture   Hx of tobacco use, presenting hazards to health 12/23/2015   Quit prior to 1997   Hx of tobacco use, presenting hazards to health 12/23/2015   Quit prior to 1997    Hyperlipidemia 12/23/2015   Hypertension    Microalbuminuria due to type 2  diabetes mellitus (HCC) 05/14/2017   Obesity 12/23/2015   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   Pulmonary embolism (HCC) 05/18/2016   Hospitalization Sept 2017, following hip fracture   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   left ant lat thigh, SCCis in a SK, schedule EDC   Squamous cell carcinoma of skin 08/14/2021   L ant lat thigh, EDC 09/07/21   Tinea pedis 06/25/2016   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     Past Surgical History:  Procedure Laterality Date   CATARACT EXTRACTION W/PHACO Right 11/26/2023   Procedure: PHACOEMULSIFICATION, CATARACT, WITH IOL INSERTION 6.85 00:43.9;  Surgeon: Clair Crews, MD;  Location: [facility];   Service: Ophthalmology;  Laterality: Right;   CIRCUMCISION N/A 03/26/2022   Procedure: CIRCUMCISION ADULT;  Surgeon: Dustin Gimenez, MD;  Location: [facility] ORS;  Service: Urology;  Laterality: N/A;   COLONOSCOPY WITH PROPOFOL  N/A 01/15/2017   Procedure: COLONOSCOPY WITH PROPOFOL ;  Surgeon: Luke Salaam, MD;  Location: [facility];  Service: Endoscopy;  Laterality: N/A;   COLONOSCOPY WITH PROPOFOL  N/A 12/17/2019   Procedure: COLONOSCOPY WITH PROPOFOL ;  Surgeon: Luke Salaam, MD;  Location: [facility] ENDOSCOPY;  Service: Gastroenterology;  Laterality: N/A;   EYE SURGERY Bilateral    cataract surgery   FRACTURE SURGERY     INTRAMEDULLARY (IM) NAIL INTERTROCHANTERIC Left 04/14/2016   Procedure: INTRAMEDULLARY (IM) NAIL INTERTROCHANTRIC;  Surgeon: Elner Hahn, MD;  Location: [facility] ORS;  Service: Orthopedics;  Laterality: Left;   JOINT REPLACEMENT Left 04/2016   TOTAL HIP ARTHROPLASTY       Social History   Tobacco Use   Smoking status: Former    Current packs/day: 0.00    Types: Cigarettes    Quit date: 09/01/1978    Years since quitting: 45.3   Smokeless tobacco: Never  Vaping Use   Vaping status: Never Used  Substance Use Topics   Alcohol use: No    Alcohol/week: 0.0 standard drinks of alcohol   Drug use: No      Family History  Problem Relation Age of Onset   Cancer Mother    Diabetes Mother    Heart disease Father    Alcohol abuse Brother      No Known Allergies   REVIEW OF SYSTEMS (Negative unless checked)  Constitutional: [] Weight loss  [] Fever  [] Chills Cardiac: [] Chest pain   [] Chest pressure   [] Palpitations   [] Shortness of breath when laying flat   [] Shortness of breath at rest   [] Shortness of breath with exertion. Vascular:  [] Pain in legs with walking   [] Pain in legs at rest   [] Pain in legs when laying flat   [] Claudication   [] Pain in feet when walking  [] Pain in feet at rest  [] Pain in feet when laying flat   [] History of DVT   [] Phlebitis   [] Swelling in legs    [] Varicose veins   [] Non-healing ulcers Pulmonary:   [] Uses home oxygen   [] Productive cough   [] Hemoptysis   [] Wheeze  [] COPD   [] Asthma Neurologic:  [] Dizziness  [] Blackouts   [] Seizures   [] History of stroke   [] History of TIA  [] Aphasia   [] Temporary blindness   [] Dysphagia   [] Weakness or numbness in arms   [] Weakness or numbness in legs Musculoskeletal:  [x] Arthritis   [] Joint swelling   [x] Joint pain   [] Low back pain Hematologic:  [] Easy bruising  [] Easy bleeding   [] Hypercoagulable state   [] Anemic   Gastrointestinal:  [] Blood in stool   [] Vomiting blood  [] Gastroesophageal  reflux/heartburn   [] Abdominal pain Genitourinary:  [] Chronic kidney disease   [] Difficult urination  [] Frequent urination  [] Burning with urination   [] Hematuria Skin:  [] Rashes   [] Ulcers   [] Wounds Psychological:  [] History of anxiety   []  History of major depression.  Physical Examination  BP 112/70   Pulse 74   Resp 20   Ht 6' (1.829 m)   Wt 273 lb (123.8 kg)   BMI 37.03 kg/m  Gen:  WD/WN, NAD. Appears younger than stated age. Head: [place]/AT, No temporalis wasting. Ear/Nose/Throat: Hearing grossly intact, nares w/o erythema or drainage Eyes: Conjunctiva clear. Sclera non-icteric Neck: Supple.  Trachea midline Pulmonary:  Good air movement, no use of accessory muscles.  Cardiac: RRR, no JVD Vascular:  Vessel Right Left  Radial Palpable Palpable                          PT Palpable Palpable  DP Palpable Palpable   Gastrointestinal: soft, non-tender/non-distended. No guarding/reflex.  Musculoskeletal: M/S 5/5 throughout.  No deformity or atrophy. Mild LE edema. Neurologic: Sensation grossly intact in extremities.  Symmetrical.  Speech is fluent.  Psychiatric: Judgment intact, Mood & affect appropriate for pt's clinical situation. Dermatologic: No rashes or ulcers noted.  No cellulitis or open wounds.      Labs Recent Results (from the past 2160 hours)  HM DIABETES EYE EXAM     Status:  None   Collection Time: 11/08/23 10:20 AM  Result Value Ref Range   HM Diabetic Eye Exam No Retinopathy No Retinopathy    Comment: Abstracted by HIM  Glucose, capillary     Status: None   Collection Time: 11/26/23  9:29 AM  Result Value Ref Range   Glucose-Capillary 73 70 - 99 mg/dL    Comment: Glucose reference range applies only to samples taken after fasting for at least 8 hours.   Comment 1 Notify RN   Glucose, capillary     Status: Abnormal   Collection Time: 11/26/23 10:02 AM  Result Value Ref Range   Glucose-Capillary 123 (H) 70 - 99 mg/dL    Comment: Glucose reference range applies only to samples taken after fasting for at least 8 hours.  Glucose, capillary     Status: Abnormal   Collection Time: 11/26/23 11:07 AM  Result Value Ref Range   Glucose-Capillary 105 (H) 70 - 99 mg/dL    Comment: Glucose reference range applies only to samples taken after fasting for at least 8 hours.  POCT HgB A1C     Status: Abnormal   Collection Time: 12/04/23  9:43 AM  Result Value Ref Range   Hemoglobin A1C 6.5 (A) 4.0 - 5.6 %   HbA1c POC (<> result, manual entry)     HbA1c, POC (prediabetic range)     HbA1c, POC (controlled diabetic range)    I-STAT creatinine     Status: None   Collection Time: 12/24/23  5:52 PM  Result Value Ref Range   Creatinine, Ser 1.20 0.61 - 1.24 mg/dL    Radiology CT ANGIO CHEST AORTA W/CM & OR WO/CM Result Date: 12/25/2023 CLINICAL DATA:  Aortic aneurysm suspected EXAM: CT ANGIOGRAPHY CHEST WITH CONTRAST TECHNIQUE: Multidetector CT imaging of the chest was performed using the standard protocol during bolus administration of intravenous contrast. Multiplanar CT image reconstructions and MIPs were obtained to evaluate the vascular anatomy. Multiplanar image (3D post-processing) reconstructions and MIPs were obtained to evaluate the vascular anatomy. RADIATION DOSE REDUCTION: This exam  was performed according to the departmental dose-optimization program which  includes automated exposure control, adjustment of the mA and/or kV according to patient size and/or use of iterative reconstruction technique. CONTRAST:  75mL OMNIPAQUE  IOHEXOL  350 MG/ML SOLN COMPARISON:  CT angiogram of the chest performed Dec 17, 2022 FINDINGS: Cardiovascular: Normal size heart. No pericardial effusion. Calcific atherosclerosis is present in the left coronary territory. Three vessel aortic arch with mild atherosclerotic changes in the proximal segments. Sinus of Valsalva: 45 x 40 x 46 mm Sinotubular junction: 41 x 42 mm Tubular ascending thoracic aorta: 45 x 45 mm Aortic arch: 35 x 34 mm Proximal descending thoracic aorta: 40 x 40 mm Descending thoracic aorta at the level of the aortic hiatus: 32 x 31 mm Main pulmonary artery: 35 mm Mediastinum/Nodes: No enlarged mediastinal, hilar, or axillary lymph nodes. Thyroid  gland, trachea, and esophagus demonstrate no significant findings. Lungs/Pleura: Lungs are clear. No pleural effusion or pneumothorax. Upper Abdomen: No acute abnormality. Musculoskeletal: Degenerative changes in the imaged osseous structures. Review of the MIP images confirms the above findings. IMPRESSION: 1. Stable aneurysm of the tubular ascending thoracic aorta measuring 4.5 cm. 2. Dilatation of proximal descending thoracic aorta measuring 4.0 cm. 3. Ascending thoracic aortic aneurysm. Recommend semi-annual imaging followup by CTA or MRA and referral to cardiothoracic surgery if not already obtained. This recommendation follows 2010 ACCF/AHA/AATS/ACR/ASA/SCA/SCAI/SIR/STS/SVM Guidelines for the Diagnosis and Management of Patients With Thoracic Aortic Disease. Circulation. 2010; 121: Y782-N562. Aortic aneurysm NOS (ICD10-I71.9) 4. Coronary artery atherosclerotic vascular disease. 5. Dilation of the main pulmonary artery which can be observed in the setting of pulmonary arterial hypertension. Electronically Signed   By: Reagan Camera M.D.   On: 12/25/2023 05:23     Assessment/Plan  Aneurysm of thoracic aorta [facility]) He has undergone a recent CT scan of the chest which I have independently reviewed.  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  This is stable without any risk to him currently.  No role for intervention at this size.  Continue to monitor on an annual basis with CT scans of the chest.  Essential hypertension, benign blood pressure control important in reducing the progression of atherosclerotic disease and aneurysmal growth. On appropriate oral medications.     Type 2 diabetes mellitus with diabetic nephropathy, with long-term current use of insulin  (HCC) blood glucose control important in reducing the progression of atherosclerotic disease. Also, involved in wound healing. On appropriate medications.    Mikki Alexander, MD  12/31/2023 9:55 AM    This note was created with Dragon medical transcription system.  Any errors from dictation are purely unintentional

## 2024-02-21 ENCOUNTER — Emergency Department

## 2024-02-21 ENCOUNTER — Other Ambulatory Visit: Payer: Self-pay

## 2024-02-21 ENCOUNTER — Emergency Department
Admission: EM | Admit: 2024-02-21 | Discharge: 2024-02-21 | Disposition: A | Discharge status: Home / Self Care | Attending: Emergency Medicine | Admitting: Emergency Medicine

## 2024-02-21 DIAGNOSIS — Z794 Long term (current) use of insulin: Secondary | ICD-10-CM | POA: Insufficient documentation

## 2024-02-21 DIAGNOSIS — Z7982 Long term (current) use of aspirin: Secondary | ICD-10-CM | POA: Insufficient documentation

## 2024-02-21 DIAGNOSIS — K5900 Constipation, unspecified: Secondary | ICD-10-CM | POA: Insufficient documentation

## 2024-02-21 LAB — CBC WITH DIFFERENTIAL/PLATELET
Abs Immature Granulocytes: 0.02 K/uL (ref 0.00–0.07)
Basophils Absolute: 0.1 K/uL (ref 0.0–0.1)
Basophils Relative: 1 %
Eosinophils Absolute: 0.2 K/uL (ref 0.0–0.5)
Eosinophils Relative: 2 %
HCT: 44.2 % (ref 39.0–52.0)
Hemoglobin: 14.8 g/dL (ref 13.0–17.0)
Immature Granulocytes: 0 %
Lymphocytes Relative: 35 %
Lymphs Abs: 2.8 K/uL (ref 0.7–4.0)
MCH: 28.5 pg (ref 26.0–34.0)
MCHC: 33.5 g/dL (ref 30.0–36.0)
MCV: 85 fL (ref 80.0–100.0)
Monocytes Absolute: 0.7 K/uL (ref 0.1–1.0)
Monocytes Relative: 8 %
Neutro Abs: 4.3 K/uL (ref 1.7–7.7)
Neutrophils Relative %: 54 %
Platelets: 211 K/uL (ref 150–400)
RBC: 5.2 MIL/uL (ref 4.22–5.81)
RDW: 14 % (ref 11.5–15.5)
WBC: 8.1 K/uL (ref 4.0–10.5)
nRBC: 0 % (ref 0.0–0.2)

## 2024-02-21 LAB — COMPREHENSIVE METABOLIC PANEL WITH GFR
ALT: 16 U/L (ref 0–44)
AST: 20 U/L (ref 15–41)
Albumin: 3.7 g/dL (ref 3.5–5.0)
Alkaline Phosphatase: 69 U/L (ref 38–126)
Anion gap: 9 (ref 5–15)
BUN: 19 mg/dL (ref 8–23)
CO2: 22 mmol/L (ref 22–32)
Calcium: 10.3 mg/dL (ref 8.9–10.3)
Chloride: 108 mmol/L (ref 98–111)
Creatinine, Ser: 1.08 mg/dL (ref 0.61–1.24)
GFR, Estimated: >60 mL/min (ref >60)
Glucose, Bld: 67 mg/dL — ABNORMAL LOW (ref 70–99)
Potassium: 3.8 mmol/L (ref 3.5–5.1)
Sodium: 139 mmol/L (ref 135–145)
Total Bilirubin: 1 mg/dL (ref 0.0–1.2)
Total Protein: 7.9 g/dL (ref 6.5–8.1)

## 2024-02-21 MED ORDER — FLEET ENEMA RE ENEM
1.0000 | ENEMA | Freq: Once | RECTAL | Status: AC
  Administered 2024-02-21: 1 via RECTAL

## 2024-02-21 MED ORDER — MAGNESIUM CITRATE PO SOLN
1.0000 | Freq: Once | ORAL | Status: AC
  Administered 2024-02-21: 1 via ORAL
  Filled 2024-02-21: qty 296

## 2024-02-21 NOTE — ED Provider Notes (Signed)
 [place] EMERGENCY DEPARTMENT AT [facility] Provider Note   CSN: [id number] Arrival date & time: 02/21/24  1757     Patient presents with: Constipation   Edgar [name] is a 78 y.o. male presents to the emergency department for evaluation of constipation.  Patient has not had a bowel movement in 6 days.  Has take milk of magnesia twice a day along with Dulcolax once daily over the last few days.  He denies any abdominal pain nausea or vomiting.  He has not had much to eat today but states he is able to drink fluids well.  He denies any history of bowel obstruction.  Denies any hard stools or rectal bleeding      Prior to Admission medications   Medication Sig Start Date End Date Taking? Authorizing Provider  ACCU-CHEK GUIDE TEST test strip USE IN THE MORNING AT [facility] AND AT BEDTIME E11.21 Patient not taking: Reported on 12/31/2023 09/06/23   Tapia, Leisa, PA-C  amLODipine  (NORVASC ) 10 MG tablet Take 1 tablet (10 mg total) by mouth daily. 12/04/23   Tapia, Leisa, PA-C  aspirin  EC 81 MG tablet Take 81 mg by mouth daily. Swallow whole.    [provider]  atorvastatin  (LIPITOR) 40 MG tablet TAKE 1 TABLET BY MOUTH EVERYDAY AT BEDTIME 12/04/23   Tapia, Leisa, PA-C  dapagliflozin  propanediol (FARXIGA ) 10 MG TABS tablet Take 1 tablet (10 mg total) by mouth in the morning. 12/04/23   Tapia, Leisa, PA-C  GVOKE HYPOPEN  2-PACK 1 MG/0.2ML SOAJ Inject 1 mg into the skin as needed (for hypoglycemia episodes (glucose below 60 and symptomatic)). 08/22/23   Tapia, Leisa, PA-C  insulin  glargine (LANTUS  SOLOSTAR) 100 UNIT/ML Solostar Pen Inject 50-70 Units into the skin at bedtime. Patient taking differently: Inject 50-70 Units into the skin at bedtime. Using 60 units 08/22/23   Tapia, Leisa, PA-C  insulin  lispro (HUMALOG  KWIKPEN) 100 UNIT/ML KwikPen DIAL  AND INJECT 10 UNITS UNDER THE SKIN THREE TIMES DAILY. 09/23/23   Tapia, Leisa, PA-C  Insulin  Pen Needle (B-D UF III MINI PEN NEEDLES) 31G X 5 MM  MISC Use to inject insulin  four times daily Patient not taking: Reported on 12/31/2023 08/22/23   Tapia, Leisa, PA-C  Lancets MISC Use as directed to monitor FSBS up to 4 x a day. Dx: E11.21 Z79.4 Patient not taking: Reported on 12/31/2023 04/23/22   Tapia, Leisa, PA-C  losartan  (COZAAR ) 100 MG tablet Take 1 tablet (100 mg total) by mouth daily. 12/04/23   Tapia, Leisa, PA-C  Trospium  Chloride 60 MG CP24 Take 1 capsule (60 mg total) by mouth daily. 08/30/23   Helon Kirsch A, PA-C    Allergies: Patient has no known allergies.    Review of Systems  Updated Vital Signs BP 99/78   Pulse 77   Temp 98 F (36.7 C) (Oral)   Resp 18   SpO2 96%   Physical Exam Constitutional:      Appearance: He is well-developed.  HENT:     Head: Normocephalic and atraumatic.  Eyes:     Conjunctiva/sclera: Conjunctivae normal.  Cardiovascular:     Rate and Rhythm: Normal rate.  Pulmonary:     Effort: Pulmonary effort is normal. No respiratory distress.  Abdominal:     General: There is no distension.     Tenderness: There is no abdominal tenderness. There is no right CVA tenderness, left CVA tenderness or guarding.  Genitourinary:    Comments: Rectal exam performed.  Mild amount of stool in the  rectal vault, moderately hard Musculoskeletal:        General: Normal range of motion.     Cervical back: Normal range of motion.  Skin:    General: Skin is warm.     Findings: No rash.  Neurological:     Mental Status: He is alert and oriented to person, place, and time.  Psychiatric:        Behavior: Behavior normal.        Thought Content: Thought content normal.     (all labs ordered are listed, but only abnormal results are displayed) Labs Reviewed  COMPREHENSIVE METABOLIC PANEL WITH GFR - Abnormal; Notable for the following components:      Result Value   Glucose, Bld 67 (*)    All other components within normal limits  CBC WITH DIFFERENTIAL/PLATELET    EKG: None  Radiology: DG Abdomen  1 View Result Date: 02/21/2024 CLINICAL DATA:  constipation EXAM: ABDOMEN - 1 VIEW COMPARISON:  None Available. FINDINGS: Nonobstructive bowel gas pattern. Moderate gaseous distention of the ascending colon. Moderate volume fecal loading in the distal transverse and descending colon, extending to the rectum. No pneumoperitoneum. No acute fracture or destructive lesion. The lung bases are clear.Multilevel degenerative disc disease of the spine. Partially visualized left femoral intramedullary nail. IMPRESSION: Nonobstructive bowel gas pattern. Moderate volume fecal loading in the left colon, worrisome for constipation. Electronically Signed   By: Rogelia Myers M.D.   On: 02/21/2024 18:56     Procedures   Medications Ordered in the ED  magnesium  citrate solution 1 Bottle (has no administration in time range)  sodium phosphate (FLEET) enema 1 enema (has no administration in time range)                                    Medical Decision Making Risk OTC drugs.   78 year old male with constipation.  No bowel movement for 6 days.  He was given enema as well as mag citrate here in the ED and did see a small bowel movement.  His vital signs are stable, afebrile.  Normal CMP as well as CBC.  Abdominal films showed no signs of obstructive bowel gas pattern.  Moderate stool burden.  Patient will has no tenderness on exam with normal bowel sounds.  He is tolerating p.o. well with no vomiting.  Patient has really had very minimal medications at home.  Will have him continue with daily Dulcolax, MiraLAX  and milk of mag at home.  He is given strict return precautions and understands signs symptoms return to the ED for. Final diagnoses:  Constipation, unspecified constipation type    ED Discharge Orders     None          [name] 02/21/24 2307    [name], MD 02/22/24 1558

## 2024-02-21 NOTE — ED Triage Notes (Signed)
 Patient states he hasn't had a bowel movement in 6 days; no relief with Miralax  or Dulcolax. Reports abdominal pain 10/10.

## 2024-02-21 NOTE — Discharge Instructions (Addendum)
 Please take MiraLAX  twice daily and Dulcolax twice daily.  Continue with milk of magnesia daily as needed.  Make sure you are drinking lots of fluids and eating a diet high in fiber.  Return to the ER for any vomiting severe abdominal pain worsening symptoms or any urgent changes in your health

## 2024-02-21 NOTE — ED Notes (Signed)
 Small brown liquid BM noted in pt's brief. Nothing noted in toilet. Some BM noted on toilet paper with wiping.

## 2024-02-21 NOTE — ED Notes (Signed)
 Pt ambulatory to bathroom at this time.

## 2024-02-24 ENCOUNTER — Other Ambulatory Visit: Payer: Self-pay | Admitting: Family Medicine

## 2024-02-24 DIAGNOSIS — Z794 Long term (current) use of insulin: Secondary | ICD-10-CM

## 2024-02-26 NOTE — Telephone Encounter (Signed)
 Requested Prescriptions  Pending Prescriptions Disp Refills   HUMALOG  KWIKPEN 100 UNIT/ML KwikPen [Pharmacy Med Name: HumaLOG  KwikPen Subcutaneous Solution Pen-injector 100 UNIT/ML] 15 mL 0    Sig: DIAL  AND INJECT 10 UNITS UNDER THE SKIN THREE TIMES DAILY.     Endocrinology:  Diabetes - Insulins Passed - 02/26/2024  4:32 PM      Passed - HBA1C is between 0 and 7.9 and within 180 days    Hemoglobin A1C  Date Value Ref Range Status  12/04/2023 6.5 (A) 4.0 - 5.6 % Final   Hgb A1c MFr Bld  Date Value Ref Range Status  08/22/2023 7.3 (H) <5.7 % of total Hgb Final    Comment:    For someone without known diabetes, a hemoglobin A1c value of 6.5% or greater indicates that they may have  diabetes and this should be confirmed with a follow-up  test. . For someone with known diabetes, a value <7% indicates  that their diabetes is well controlled and a value  greater than or equal to 7% indicates suboptimal  control. A1c targets should be individualized based on  duration of diabetes, age, comorbid conditions, and  other considerations. . Currently, no consensus exists regarding use of hemoglobin A1c for diagnosis of diabetes for children. Edgar [name] - Valid encounter within last 6 months    Recent Outpatient Visits           2 months ago Type 2 diabetes mellitus with diabetic nephropathy, with long-term current use of insulin  [facility])   [facility] Edgar Mole, PA-C       Future Appointments             In 1 month Edgar Alm BROCKS, MD [facility]   In 3 months Edgar Mole, PA-C [facility]   In 6 months Edgar [name], Edgar [name] [facility]

## 2024-03-11 ENCOUNTER — Encounter: Payer: Self-pay | Admitting: Family Medicine

## 2024-03-19 ENCOUNTER — Ambulatory Visit: Admitting: Family Medicine

## 2024-03-19 ENCOUNTER — Encounter: Payer: Self-pay | Admitting: Family Medicine

## 2024-03-19 ENCOUNTER — Ambulatory Visit
Admission: RE | Admit: 2024-03-19 | Discharge: 2024-03-19 | Disposition: A | Discharge status: Home / Self Care | Attending: Family Medicine | Admitting: Family Medicine

## 2024-03-19 ENCOUNTER — Ambulatory Visit
Admission: RE | Admit: 2024-03-19 | Discharge: 2024-03-19 | Disposition: A | Discharge status: Home / Self Care | Source: Ambulatory Visit | Attending: Family Medicine | Admitting: Family Medicine

## 2024-03-19 VITALS — BP 128/86 | HR 88 | Temp 97.9°F | Resp 16 | Ht 72.0 in | Wt 271.0 lb

## 2024-03-19 DIAGNOSIS — I152 Hypertension secondary to endocrine disorders: Secondary | ICD-10-CM

## 2024-03-19 DIAGNOSIS — R1084 Generalized abdominal pain: Secondary | ICD-10-CM | POA: Insufficient documentation

## 2024-03-19 DIAGNOSIS — E1159 Type 2 diabetes mellitus with other circulatory complications: Secondary | ICD-10-CM | POA: Diagnosis not present

## 2024-03-19 DIAGNOSIS — I10 Essential (primary) hypertension: Secondary | ICD-10-CM | POA: Diagnosis not present

## 2024-03-19 DIAGNOSIS — R809 Proteinuria, unspecified: Secondary | ICD-10-CM | POA: Diagnosis not present

## 2024-03-19 DIAGNOSIS — K5900 Constipation, unspecified: Secondary | ICD-10-CM | POA: Insufficient documentation

## 2024-03-19 DIAGNOSIS — E1121 Type 2 diabetes mellitus with diabetic nephropathy: Secondary | ICD-10-CM

## 2024-03-19 DIAGNOSIS — R109 Unspecified abdominal pain: Secondary | ICD-10-CM | POA: Diagnosis not present

## 2024-03-19 DIAGNOSIS — Z09 Encounter for follow-up examination after completed treatment for conditions other than malignant neoplasm: Secondary | ICD-10-CM | POA: Diagnosis not present

## 2024-03-19 DIAGNOSIS — Z794 Long term (current) use of insulin: Secondary | ICD-10-CM | POA: Diagnosis not present

## 2024-03-19 DIAGNOSIS — I878 Other specified disorders of veins: Secondary | ICD-10-CM | POA: Diagnosis not present

## 2024-03-19 MED ORDER — POLYETHYLENE GLYCOL 3350 17 GM/SCOOP PO POWD: 17.0000 g | Freq: Every day | ORAL | 2 refills | Status: AC | PRN

## 2024-03-19 MED ORDER — MAGNESIUM OXIDE -MG SUPPLEMENT 400 (240 MG) MG PO TABS: 400.0000 mg | ORAL_TABLET | Freq: Every day | ORAL | 1 refills | Status: AC | PRN

## 2024-03-19 NOTE — Progress Notes (Signed)
 "   Name: Edgar [name]   MRN: [id number]    DOB: 1946/07/17   Date:03/19/2024       Progress Note  Chief Complaint  Patient presents with   Constipation    Since July, not resolving.     Subjective:   Edgar [name] is a 78 y.o. male, presents to clinic for routine follow up on chronic conditions and constipation  Constipation and ER visit in the past month DG Abdomen 1 View CLINICAL DATA:  constipation  EXAM: ABDOMEN - 1 VIEW  COMPARISON:  None Available.  FINDINGS: Nonobstructive bowel gas pattern. Moderate gaseous distention of the ascending colon. Moderate volume fecal loading in the distal transverse and descending colon, extending to the rectum. No pneumoperitoneum. No acute fracture or destructive lesion. The lung bases are clear.Multilevel degenerative disc disease of the spine. Partially visualized left femoral intramedullary nail.  IMPRESSION: Nonobstructive bowel gas pattern. Moderate volume fecal loading in the left colon, worrisome for constipation.  Electronically Signed   By: Rogelia Myers M.D.   On: 02/21/2024 18:56  Discussed the use of AI scribe software for clinical note transcription with the patient, who gave verbal consent to proceed.  History of Present Illness Edgar [name] is a 78 year old male who presents with constipation and bowel movement issues.  Constipation and bowel dysfunction - Severe constipation with no bowel movement for over a week, requiring hospital treatment - Hospital intervention included rectal spray to soften stool, avoiding manual disimpaction - Magnesium  citrate administered in hospital, resulting in loose, watery stools and need for multiple pull-ups - Currently uses liquid magnesium  supplements to maintain bowel looseness, but concerned about excessive looseness and urgency - Fears eating due to potential for urgent bathroom needs - History of severe pain and swelling from bowel blockage, previously requiring  hospital intervention - Uses Raisin Bran with milk for fiber, but cautious about overconsumption  Upper respiratory symptoms - Recurrent sinus issues with weather changes - Green nasal discharge - Manages symptoms with a handkerchief  Nutritional intake and support - Finds cooking challenging and relies on daughters for meal preparation - No recent food intake - No blood sugar issues      Current Outpatient Medications:    amLODipine  (NORVASC ) 10 MG tablet, Take 1 tablet (10 mg total) by mouth daily., Disp: 90 tablet, Rfl: 0   aspirin  EC 81 MG tablet, Take 81 mg by mouth daily. Swallow whole., Disp: , Rfl:    atorvastatin  (LIPITOR) 40 MG tablet, TAKE 1 TABLET BY MOUTH EVERYDAY AT BEDTIME, Disp: 90 tablet, Rfl: 1   dapagliflozin  propanediol (FARXIGA ) 10 MG TABS tablet, Take 1 tablet (10 mg total) by mouth in the morning., Disp: 30 tablet, Rfl: 5   GVOKE HYPOPEN  2-PACK 1 MG/0.2ML SOAJ, Inject 1 mg into the skin as needed (for hypoglycemia episodes (glucose below 60 and symptomatic))., Disp: 0.4 mL, Rfl: 1   HUMALOG  KWIKPEN 100 UNIT/ML KwikPen, DIAL  AND INJECT 10 UNITS UNDER THE SKIN THREE TIMES DAILY., Disp: 15 mL, Rfl: 0   insulin  glargine (LANTUS  SOLOSTAR) 100 UNIT/ML Solostar Pen, Inject 50-70 Units into the skin at bedtime., Disp: 30 mL, Rfl: 5   losartan  (COZAAR ) 100 MG tablet, Take 1 tablet (100 mg total) by mouth daily., Disp: 90 tablet, Rfl: 1   magnesium  citrate SOLN, Take 1 Bottle by mouth once., Disp: , Rfl:    magnesium  hydroxide (MILK OF MAGNESIA) 400 MG/5ML suspension, Take 15 mLs by mouth daily as needed for mild  constipation., Disp: , Rfl:    Trospium  Chloride 60 MG CP24, Take 1 capsule (60 mg total) by mouth daily., Disp: 90 capsule, Rfl: 3  Patient Active Problem List   Diagnosis Date Noted   Class 2 severe obesity with serious comorbidity and body mass index (BMI) of 36.0 to 36.9 in adult [facility]) 08/22/2023   Lower urinary tract symptoms (LUTS) 11/29/2022   Urine test  positive for microalbuminuria 11/29/2022   Hypertension associated with type 2 diabetes mellitus (HCC) 12/21/2021   Acquired trigger finger of right index finger 12/30/2020   Aortic atherosclerosis (HCC) 08/01/2020   Coronary artery disease 10/25/2017   Aneurysm of thoracic aorta (HCC) 09/24/2017   History of colonoscopy with polypectomy 12/07/2016   Fatty liver 09/11/2016   Essential hypertension, benign 12/23/2015   Mixed hyperlipidemia 12/23/2015   Morbid obesity (HCC) 12/23/2015   Type 2 diabetes mellitus with diabetic nephropathy, with long-term current use of insulin  (HCC) 01/25/2015    Past Surgical History:  Procedure Laterality Date   CATARACT EXTRACTION W/PHACO Right 11/26/2023   Procedure: PHACOEMULSIFICATION, CATARACT, WITH IOL INSERTION 6.85 00:43.9;  Surgeon: Jaye Fallow, MD;  Location: [facility];  Service: Ophthalmology;  Laterality: Right;   CIRCUMCISION N/A 03/26/2022   Procedure: CIRCUMCISION ADULT;  Surgeon: Penne Knee, MD;  Location: [facility];  Service: Urology;  Laterality: N/A;   COLONOSCOPY WITH PROPOFOL  N/A 01/15/2017   Procedure: COLONOSCOPY WITH PROPOFOL ;  Surgeon: Therisa Bi, MD;  Location: [facility] END[facility]COPY;  Service: Endoscopy;  Laterality: N/A;   COLONOSCOPY WITH PROPOFOL  N/A 12/17/2019   Procedure: COLONOSCOPY WITH PROPOFOL ;  Surgeon: Therisa Bi, MD;  Location: [facility] ENDOSCOPY;  Service: Gastroenterology;  Laterality: N/A;   EYE SURGERY Bilateral    cataract surgery   FRACTURE SURGERY     INTRAMEDULLARY (IM) NAIL INTERTROCHANTERIC Left 04/14/2016   Procedure: INTRAMEDULLARY (IM) NAIL INTERTROCHANTRIC;  Surgeon: Norleen JINNY Maltos, MD;  Location: [facility];  Service: Orthopedics;  Laterality: Left;   JOINT REPLACEMENT Left 04/2016   TOTAL HIP ARTHROPLASTY      Family History  Problem Relation Age of Onset   Cancer Mother    Diabetes Mother    Heart disease Father    Alcohol abuse Brother     Social History   Tobacco Use   Smoking  status: Former    Current packs/day: 0.00    Types: Cigarettes    Quit date: 09/01/1978    Years since quitting: 45.5   Smokeless tobacco: Never  Vaping Use   Vaping status: Never Used  Substance Use Topics   Alcohol use: No    Alcohol/week: 0.0 standard drinks of alcohol   Drug use: No     No Known Allergies  Health Maintenance  Topic Date Due   Medicare Annual Wellness (AWV)  04/11/2024   COVID-19 Vaccine (5 - 2024-25 season) 04/04/2024 (Originally 04/07/2023)   DTaP/Tdap/Td (2 - Tdap) 08/21/2024 (Originally 05/14/2019)   INFLUENZA VACCINE  11/03/2024 (Originally 03/06/2024)   HEMOGLOBIN A1C  06/04/2024   Diabetic kidney evaluation - Urine ACR  08/21/2024   FOOT EXAM  08/21/2024   OPHTHALMOLOGY EXAM  11/07/2024   Colonoscopy  12/17/2024   Diabetic kidney evaluation - eGFR measurement  02/20/2025   Pneumococcal Vaccine: 50+ Years  Completed   Hepatitis C Screening  Completed   Zoster Vaccines- Shingrix   Completed   HPV VACCINES  Aged Out   Meningococcal B Vaccine  Aged Out    Chart Review Today: I personally reviewed active problem list, medication list,  allergies, family history, social history, health maintenance, notes from last encounter, lab results, imaging with the patient/caregiver today.   Review of Systems  Constitutional: Negative.   HENT: Negative.    Eyes: Negative.   Respiratory: Negative.    Cardiovascular: Negative.   Gastrointestinal: Negative.   Endocrine: Negative.   Genitourinary: Negative.   Musculoskeletal: Negative.   Skin: Negative.   Allergic/Immunologic: Negative.   Neurological: Negative.   Hematological: Negative.   Psychiatric/Behavioral: Negative.    All other systems reviewed and are negative.    Objective:   Vitals:   03/19/24 0857  BP: 128/86  Pulse: 88  Resp: 16  Temp: 97.9 F (36.6 C)  SpO2: 99%  Weight: 271 lb (122.9 kg)  Height: 6' (1.829 m)    Body mass index is 36.75 kg/m.  Physical Exam Vitals and nursing  note reviewed.  Constitutional:      General: He is not in acute distress.    Appearance: Normal appearance. He is well-developed. He is obese. He is not ill-appearing, toxic-appearing or diaphoretic.  HENT:     Head: Normocephalic and atraumatic.     Right Ear: External ear normal.     Left Ear: External ear normal.     Nose: Nose normal.  Eyes:     General: No scleral icterus.       Right eye: No discharge.        Left eye: No discharge.     Conjunctiva/sclera: Conjunctivae normal.  Neck:     Trachea: No tracheal deviation.  Cardiovascular:     Rate and Rhythm: Normal rate.  Pulmonary:     Effort: Pulmonary effort is normal. No respiratory distress.     Breath sounds: No stridor.  Abdominal:     General: Abdomen is protuberant. Bowel sounds are normal. There is no distension.     Palpations: Abdomen is soft.     Tenderness: There is no abdominal tenderness. There is no guarding or rebound.  Skin:    General: Skin is warm and dry.     Findings: No rash.  Neurological:     Mental Status: He is alert.     Motor: No abnormal muscle tone.     Coordination: Coordination normal.     Gait: Gait normal.  Psychiatric:        Mood and Affect: Mood normal.        Behavior: Behavior normal.          Assessment & Plan:   Generalized abdominal pain - see below -     DG Abd 1 View -     Ambulatory referral to Gastroenterology  Constipation, unspecified constipation type - see below -     Polyethylene Glycol 3350 ; Take 17 g by mouth daily as needed for moderate constipation or severe constipation. Until daily soft stools  Dispense: 578 g; Refill: 2 -     Magnesium  Oxide -Mg Supplement; Take 1 tablet (400 mg total) by mouth daily as needed.  Dispense: 90 tablet; Refill: 1 -     DG Abd 1 View -     Ambulatory referral to Gastroenterology  Urine test positive for microalbuminuria -     Microalbumin / creatinine urine ratio  Essential hypertension, benign BP Readings from Last  3 Encounters:  03/19/24 128/86  02/21/24 (!) 134/99  12/31/23 112/70  Bp currently controlled on current meds  -     Comprehensive metabolic panel with GFR  Morbid obesity (HCC) Associated comorbidities including Htn  HLD DM  Wt Readings from Last 5 Encounters:  03/19/24 271 lb (122.9 kg)  12/31/23 273 lb (123.8 kg)  12/04/23 268 lb 1.6 oz (121.6 kg)  11/26/23 267 lb (121.1 kg)  08/22/23 268 lb 1.6 oz (121.6 kg)   BMI Readings from Last 5 Encounters:  03/19/24 36.75 kg/m  12/31/23 37.03 kg/m  12/04/23 36.36 kg/m  11/26/23 36.20 kg/m  08/22/23 36.36 kg/m    Hypertension associated with type 2 diabetes mellitus (HCC) See above and below  Encounter for examination following treatment at hospital ER visit results, notes, imaging all reviewed with pt He has kept loose to watery bowels since Ed but also has been drinking a lot of magnesium /mylanta and mag citrate   Type 2 diabetes mellitus with diabetic nephropathy, with long-term current use of insulin  (HCC) -     Comprehensive metabolic panel with GFR -     Hemoglobin A1c -     Microalbumin / creatinine urine ratio    Assessment & Plan Chronic constipation Chronic constipation with recent severe episode managed in hospital. Current high-dose magnesium  causing diarrhea. Needs softer stools without diarrhea. - Prescribe Miralax  for stool softening. - Recommend lower dose magnesium  supplement and stop what he is currently taking - Advise high fiber diet and adequate fluid intake. - Order abdominal x-ray to reassess stool burden.  Type 2 diabetes mellitus with diabetic nephropathy No issues with blood glucose control. Managed with insulin . Farxiga  supply needs follow-up. - Continue current diabetes management regimen. - Follow up on Farxiga  supply with pharmacist.  Chronic sinusitis Recurrent symptoms with seasonal changes.  Recording duration: 18 minutes     Return in about 1 month (around 04/19/2024) for 1  month constipation follow up, 6 month DM HTN HLD f/up .   Michelene Cower, PA-C 03/19/24 9:33 AM   "

## 2024-03-20 LAB — COMPREHENSIVE METABOLIC PANEL WITH GFR
AG Ratio: 1.2 (calc) (ref 1.0–2.5)
ALT: 17 U/L (ref 9–46)
AST: 16 U/L (ref 10–35)
Albumin: 3.9 g/dL (ref 3.6–5.1)
Alkaline phosphatase (APISO): 73 U/L (ref 35–144)
BUN: 12 mg/dL (ref 7–25)
CO2: 28 mmol/L (ref 20–32)
Calcium: 10.5 mg/dL — ABNORMAL HIGH (ref 8.6–10.3)
Chloride: 106 mmol/L (ref 98–110)
Creat: 0.98 mg/dL (ref 0.70–1.28)
Globulin: 3.3 g/dL (ref 1.9–3.7)
Glucose, Bld: 140 mg/dL — ABNORMAL HIGH (ref 65–99)
Potassium: 4.4 mmol/L (ref 3.5–5.3)
Sodium: 140 mmol/L (ref 135–146)
Total Bilirubin: 0.6 mg/dL (ref 0.2–1.2)
Total Protein: 7.2 g/dL (ref 6.1–8.1)
eGFR: 79 mL/min/1.73m2 (ref >=60)

## 2024-03-20 LAB — HEMOGLOBIN A1C
Hgb A1c MFr Bld: 7.8 % — ABNORMAL HIGH (ref <5.7)
Mean Plasma Glucose: 177 mg/dL
eAG (mmol/L): 9.8 mmol/L

## 2024-03-20 LAB — MICROALBUMIN / CREATININE URINE RATIO
Creatinine, Urine: 128 mg/dL (ref 20–320)
Microalb Creat Ratio: 129 mg/g{creat} — ABNORMAL HIGH (ref <30)
Microalb, Ur: 16.5 mg/dL

## 2024-03-24 ENCOUNTER — Ambulatory Visit: Payer: Self-pay | Admitting: Family Medicine

## 2024-03-27 ENCOUNTER — Ambulatory Visit: Admitting: Family Medicine

## 2024-04-20 ENCOUNTER — Encounter: Payer: Self-pay | Admitting: Family Medicine

## 2024-04-20 ENCOUNTER — Ambulatory Visit (INDEPENDENT_AMBULATORY_CARE_PROVIDER_SITE_OTHER): Admitting: Family Medicine

## 2024-04-20 VITALS — BP 120/78 | HR 78 | Resp 16 | Ht 72.0 in | Wt 267.0 lb

## 2024-04-20 DIAGNOSIS — E1121 Type 2 diabetes mellitus with diabetic nephropathy: Secondary | ICD-10-CM | POA: Diagnosis not present

## 2024-04-20 DIAGNOSIS — H6123 Impacted cerumen, bilateral: Secondary | ICD-10-CM | POA: Diagnosis not present

## 2024-04-20 DIAGNOSIS — Z794 Long term (current) use of insulin: Secondary | ICD-10-CM | POA: Diagnosis not present

## 2024-04-20 DIAGNOSIS — K5900 Constipation, unspecified: Secondary | ICD-10-CM | POA: Diagnosis not present

## 2024-04-20 DIAGNOSIS — R1084 Generalized abdominal pain: Secondary | ICD-10-CM | POA: Diagnosis not present

## 2024-04-20 DIAGNOSIS — I10 Essential (primary) hypertension: Secondary | ICD-10-CM

## 2024-04-20 NOTE — Progress Notes (Signed)
 Patient ID: Edgar [name], male    DOB: 04-27-1946, 78 y.o.   MRN: [id number]  PCP: Leavy Mole, PA-C  Chief Complaint  Patient presents with   Follow-up    1 month    Subjective:   Edgar [name] is a 78 y.o. male, presents to clinic with CC of the following:  HPI  F/up constipation and change in meds/management BM once daily, no abd pain, no concerns with constipation right now  Ears blocked, hearing aids making noise, ears were checked and impacted bilaterally, CMA initiated irrigation Rechecked by me Discussed the use of AI scribe software for clinical note transcription with the patient, who gave verbal consent to proceed.  History of Present Illness Edgar [name] is a 78 year old male who presents for follow-up on constipation and medication management.  Altered bowel habits and constipation - Abdominal pain and severe constipation approximately one month ago, resulting in an emergency room visit - At follow-up on August 14th, using high-dose liquid magnesium  supplements for stool softening, resulting in watery, frequent bowel movements and urgency - Transitioned from high-dose liquid magnesium  to one magnesium  tablet supplement daily since last visit - Titrating Miralax  to achieve soft, daily stools - Currently having one bowel movement per day - No constipation, abdominal pain, or watery bowel movements at present - Maintains adequate hydration and consumes Cheerios cereal; avoids Frosted Flakes due to sugar content  Ear health - Recent ear cleaning performed - Uses softening drops in one ear - Family history of ear problems, as mother had similar issues  Diabetes mellitus management - Did not check blood glucose on the morning of the visit - No recent changes to diabetes medication regimen     Patient Active Problem List   Diagnosis Date Noted   Class 2 severe obesity with serious comorbidity and body mass index (BMI) of 36.0 to 36.9 in adult [facility])  08/22/2023   Lower urinary tract symptoms (LUTS) 11/29/2022   Urine test positive for microalbuminuria 11/29/2022   Hypertension associated with type 2 diabetes mellitus (HCC) 12/21/2021   Acquired trigger finger of right index finger 12/30/2020   Aortic atherosclerosis (HCC) 08/01/2020   Coronary artery disease 10/25/2017   Aneurysm of thoracic aorta (HCC) 09/24/2017   History of colonoscopy with polypectomy 12/07/2016   Fatty liver 09/11/2016   Essential hypertension, benign 12/23/2015   Mixed hyperlipidemia 12/23/2015   Morbid obesity (HCC) 12/23/2015   Type 2 diabetes mellitus with diabetic nephropathy, with long-term current use of insulin  (HCC) 01/25/2015      Current Outpatient Medications:    amLODipine  (NORVASC ) 10 MG tablet, Take 1 tablet (10 mg total) by mouth daily., Disp: 90 tablet, Rfl: 0   aspirin  EC 81 MG tablet, Take 81 mg by mouth daily. Swallow whole., Disp: , Rfl:    atorvastatin  (LIPITOR) 40 MG tablet, TAKE 1 TABLET BY MOUTH EVERYDAY AT BEDTIME, Disp: 90 tablet, Rfl: 1   dapagliflozin  propanediol (FARXIGA ) 10 MG TABS tablet, Take 1 tablet (10 mg total) by mouth in the morning., Disp: 30 tablet, Rfl: 5   GVOKE HYPOPEN  2-PACK 1 MG/0.2ML SOAJ, Inject 1 mg into the skin as needed (for hypoglycemia episodes (glucose below 60 and symptomatic))., Disp: 0.4 mL, Rfl: 1   HUMALOG  KWIKPEN 100 UNIT/ML KwikPen, DIAL  AND INJECT 10 UNITS UNDER THE SKIN THREE TIMES DAILY., Disp: 15 mL, Rfl: 0   insulin  glargine (LANTUS  SOLOSTAR) 100 UNIT/ML Solostar Pen, Inject 50-70 Units into the skin at bedtime., Disp:  30 mL, Rfl: 5   losartan  (COZAAR ) 100 MG tablet, Take 1 tablet (100 mg total) by mouth daily., Disp: 90 tablet, Rfl: 1   magnesium  citrate SOLN, Take 1 Bottle by mouth once., Disp: , Rfl:    MAGnesium -Oxide 400 (240 Mg) MG tablet, Take 1 tablet (400 mg total) by mouth daily as needed., Disp: 90 tablet, Rfl: 1   polyethylene glycol powder (GLYCOLAX /MIRALAX ) 17 GM/SCOOP powder, Take  17 g by mouth daily as needed for moderate constipation or severe constipation. Until daily soft stools, Disp: 578 g, Rfl: 2   Trospium  Chloride 60 MG CP24, Take 1 capsule (60 mg total) by mouth daily., Disp: 90 capsule, Rfl: 3   No Known Allergies   Social History   Tobacco Use   Smoking status: Former    Current packs/day: 0.00    Types: Cigarettes    Quit date: 09/01/1978    Years since quitting: 45.6   Smokeless tobacco: Never  Vaping Use   Vaping status: Never Used  Substance Use Topics   Alcohol use: No    Alcohol/week: 0.0 standard drinks of alcohol   Drug use: No      Chart Review Today: I personally reviewed active problem list, medication list, allergies, family history, social history, health maintenance, notes from last encounter, lab results, imaging with the patient/caregiver today.   Review of Systems  Constitutional: Negative.   HENT: Negative.    Eyes: Negative.   Respiratory: Negative.    Cardiovascular: Negative.   Gastrointestinal: Negative.   Endocrine: Negative.   Genitourinary: Negative.   Musculoskeletal: Negative.   Skin: Negative.   Allergic/Immunologic: Negative.   Neurological: Negative.   Hematological: Negative.   Psychiatric/Behavioral: Negative.    All other systems reviewed and are negative.      Objective:   Vitals:   04/20/24 0941  BP: 120/78  Pulse: 78  Resp: 16  SpO2: 96%  Weight: 267 lb (121.1 kg)  Height: 6' (1.829 m)    Body mass index is 36.21 kg/m.  Physical Exam Vitals and nursing note reviewed.  Constitutional:      General: He is not in acute distress.    Appearance: Normal appearance. He is well-developed. He is obese. He is not ill-appearing, toxic-appearing or diaphoretic.  HENT:     Head: Normocephalic and atraumatic.     Right Ear: External ear normal. Decreased hearing noted. Swelling (slight canal edema and erythema) present. No drainage or tenderness. No middle ear effusion. No mastoid tenderness.  Tympanic membrane is not injected, scarred or perforated.     Left Ear: External ear normal. Decreased hearing noted. No drainage, swelling or tenderness.  No middle ear effusion. No mastoid tenderness. Tympanic membrane is erythematous (mild injection). Tympanic membrane is not injected, scarred, perforated, retracted or bulging.     Nose: Nose normal.  Eyes:     General: No scleral icterus.       Right eye: No discharge.        Left eye: No discharge.     Conjunctiva/sclera: Conjunctivae normal.  Neck:     Trachea: No tracheal deviation.  Cardiovascular:     Rate and Rhythm: Normal rate.     Pulses: Normal pulses.     Heart sounds: Normal heart sounds.  Pulmonary:     Effort: Pulmonary effort is normal. No respiratory distress.     Breath sounds: No stridor.  Abdominal:     General: Abdomen is protuberant. Bowel sounds are normal.  Tenderness: There is no abdominal tenderness. There is no right CVA tenderness, left CVA tenderness, guarding or rebound.     Comments: Rectus diastasis   Skin:    General: Skin is warm and dry.     Findings: No rash.  Neurological:     Mental Status: He is alert.     Motor: No abnormal muscle tone.     Coordination: Coordination normal.     Gait: Gait normal.  Psychiatric:        Mood and Affect: Mood normal.        Behavior: Behavior normal.      Results for orders placed or performed in visit on 03/19/24  Comprehensive metabolic panel with GFR   Collection Time: 03/19/24  9:53 AM  Result Value Ref Range   Glucose, Bld 140 (H) 65 - 99 mg/dL   BUN 12 7 - 25 mg/dL   Creat 9.01 9.29 - 8.71 mg/dL   eGFR 79 > OR = 60 fO/fpw/8.26f7   BUN/Creatinine Ratio SEE NOTE: 6 - 22 (calc)   Sodium 140 135 - 146 mmol/L   Potassium 4.4 3.5 - 5.3 mmol/L   Chloride 106 98 - 110 mmol/L   CO2 28 20 - 32 mmol/L   Calcium  10.5 (H) 8.6 - 10.3 mg/dL   Total Protein 7.2 6.1 - 8.1 g/dL   Albumin 3.9 3.6 - 5.1 g/dL   Globulin 3.3 1.9 - 3.7 g/dL (calc)   AG  Ratio 1.2 1.0 - 2.5 (calc)   Total Bilirubin 0.6 0.2 - 1.2 mg/dL   Alkaline phosphatase (APISO) 73 35 - 144 U/L   AST 16 10 - 35 U/L   ALT 17 9 - 46 U/L  Hemoglobin A1c   Collection Time: 03/19/24  9:53 AM  Result Value Ref Range   Hgb A1c MFr Bld 7.8 (H) <5.7 %   Mean Plasma Glucose 177 mg/dL   eAG (mmol/L) 9.8 mmol/L  Microalbumin / creatinine urine ratio   Collection Time: 03/19/24  9:53 AM  Result Value Ref Range   Creatinine, Urine 128 20 - 320 mg/dL   Microalb, Ur 83.4 mg/dL   Microalb Creat Ratio 129 (H) <30 mg/g creat       Assessment & Plan:    Assessment & Plan 1. Impacted cerumen of both ears (Primary) Bilateral impaction with dysfunction of his hearing aids - irrigated prior to me examining pt, TM both visible and intact - Ear Lavage Impacted cerumen, bilateral Left ear appears clear with visible eardrum. Right ear has some residual wax and irritation but is not blocked. - Use softening drops in the right ear + gentle irrigation - verbal and printed info and instructions given today - Return for ear flushing if blockage persists or if ears feel blocked  2. Constipation, unspecified constipation type Constipation Previously experienced severe constipation with abdominal pain, leading to an ER visit. Currently reports having a bowel movement once a day without constipation or abdominal pain. No longer experiencing watery stools as before. Improvement noted with current management. - Continue current bowel regimen with magnesium  tablet supplement and Miralax  titration to maintain soft daily stools - Encourage adequate hydration and intake of fruits and vegetables  3. Generalized abdominal pain Resolved   4. Morbid obesity (HCC) Associated comorbidities include DM, HTN, HLD Wt Readings from Last 5 Encounters:  04/20/24 267 lb (121.1 kg)  03/19/24 271 lb (122.9 kg)  12/31/23 273 lb (123.8 kg)  12/04/23 268 lb 1.6 oz (121.6 kg)  11/26/23  267 lb (121.1 kg)   BMI  Readings from Last 5 Encounters:  04/20/24 36.21 kg/m  03/19/24 36.75 kg/m  12/31/23 37.03 kg/m  12/04/23 36.36 kg/m  11/26/23 36.20 kg/m     5. Essential hypertension, benign BP near goal today on current meds, no labs and changes at this time  6. Type 2 diabetes mellitus with diabetic nephropathy, with long-term current use of insulin  (HCC) Pt states he didn't check his blood sugars this am, he is working with pharmacist and PCP for improved glycemic control with planned f/up in Nov due to uncontrolled A1c At this time he has no concerns  Lab Results  Component Value Date   HGBA1C 7.8 (H) 03/19/2024    Recording duration: 6 minutes       Michelene Cower, PA-C 04/20/24 10:02 AM

## 2024-04-20 NOTE — Patient Instructions (Signed)
 You can use debrox drops over the counter to break up and soften wax, and only irrigate ears gently or come into the office to ear check and irrigation.

## 2024-04-23 ENCOUNTER — Ambulatory Visit: Payer: Medicare PPO | Admitting: Dermatology

## 2024-04-23 ENCOUNTER — Encounter: Payer: Self-pay | Admitting: Dermatology

## 2024-04-23 DIAGNOSIS — B360 Pityriasis versicolor: Secondary | ICD-10-CM | POA: Diagnosis not present

## 2024-04-23 DIAGNOSIS — Z8589 Personal history of malignant neoplasm of other organs and systems: Secondary | ICD-10-CM

## 2024-04-23 DIAGNOSIS — D1801 Hemangioma of skin and subcutaneous tissue: Secondary | ICD-10-CM

## 2024-04-23 DIAGNOSIS — D229 Melanocytic nevi, unspecified: Secondary | ICD-10-CM

## 2024-04-23 DIAGNOSIS — Z7189 Other specified counseling: Secondary | ICD-10-CM

## 2024-04-23 DIAGNOSIS — L219 Seborrheic dermatitis, unspecified: Secondary | ICD-10-CM | POA: Diagnosis not present

## 2024-04-23 DIAGNOSIS — L578 Other skin changes due to chronic exposure to nonionizing radiation: Secondary | ICD-10-CM

## 2024-04-23 DIAGNOSIS — W908XXA Exposure to other nonionizing radiation, initial encounter: Secondary | ICD-10-CM | POA: Diagnosis not present

## 2024-04-23 DIAGNOSIS — Z1283 Encounter for screening for malignant neoplasm of skin: Secondary | ICD-10-CM

## 2024-04-23 DIAGNOSIS — L729 Follicular cyst of the skin and subcutaneous tissue, unspecified: Secondary | ICD-10-CM

## 2024-04-23 DIAGNOSIS — L814 Other melanin hyperpigmentation: Secondary | ICD-10-CM | POA: Diagnosis not present

## 2024-04-23 DIAGNOSIS — B353 Tinea pedis: Secondary | ICD-10-CM

## 2024-04-23 DIAGNOSIS — L720 Epidermal cyst: Secondary | ICD-10-CM

## 2024-04-23 DIAGNOSIS — L821 Other seborrheic keratosis: Secondary | ICD-10-CM | POA: Diagnosis not present

## 2024-04-23 DIAGNOSIS — Z85828 Personal history of other malignant neoplasm of skin: Secondary | ICD-10-CM

## 2024-04-23 DIAGNOSIS — Z79899 Other long term (current) drug therapy: Secondary | ICD-10-CM

## 2024-04-23 MED ORDER — FLUCONAZOLE 200 MG PO TABS: ORAL_TABLET | ORAL | 0 refills | Status: DC

## 2024-04-23 MED ORDER — KETOCONAZOLE 2 % EX CREA: TOPICAL_CREAM | CUTANEOUS | 11 refills | Status: DC

## 2024-04-23 MED ORDER — HYDROCORTISONE 2.5 % EX CREA: TOPICAL_CREAM | CUTANEOUS | 11 refills | Status: DC

## 2024-04-23 NOTE — Progress Notes (Signed)
 Follow-Up Visit   Subjective  Edgar [name] is a 78 y.o. male who presents for the following: Skin Cancer Screening and Full Body Skin Exam, hx of SCC   The patient presents for Total-Body Skin Exam (TBSE) for skin cancer screening and mole check. The patient has spots, moles and lesions to be evaluated, some may be new or changing and the patient may have concern these could be cancer.  The following portions of the chart were reviewed this encounter and updated as appropriate: medications, allergies, medical history  Review of Systems:  No other skin or systemic complaints except as noted in HPI or Assessment and Plan.  Objective  Well appearing patient in no apparent distress; mood and affect are within normal limits.  A full examination was performed including scalp, head, eyes, ears, nose, lips, neck, chest, axillae, abdomen, back, buttocks, bilateral upper extremities, bilateral lower extremities, hands, feet, fingers, toes, fingernails, and toenails. All findings within normal limits unless otherwise noted below.   Relevant physical exam findings are noted in the Assessment and Plan.       Assessment & Plan   SKIN CANCER SCREENING PERFORMED TODAY.  ACTINIC DAMAGE - Chronic condition, secondary to cumulative UV/sun exposure - diffuse scaly erythematous macules with underlying dyspigmentation - Recommend daily broad spectrum sunscreen SPF 30+ to sun-exposed areas, reapply every 2 hours as needed.  - Staying in the shade or wearing long sleeves, sun glasses (UVA+UVB protection) and wide brim hats (4-inch brim around the entire circumference of the hat) are also recommended for sun protection.  - Call for new or changing lesions.  LENTIGINES, SEBORRHEIC KERATOSES, HEMANGIOMAS - Benign normal skin lesions - Benign-appearing - Call for any changes  MELANOCYTIC NEVI - Tan-brown and/or pink-flesh-colored symmetric macules and papules - Benign appearing on exam today -  Observation - Call clinic for new or changing moles - Recommend daily use of broad spectrum spf 30+ sunscreen to sun-exposed areas.    EPIDERMAL INCLUSION CYST Exam: 0.5 cm Subcutaneous nodule on the back  Benign-appearing. Exam most consistent with an epidermal inclusion cyst. Discussed that a cyst is a benign growth that can grow over time and sometimes get irritated or inflamed. Recommend observation if it is not bothersome. Discussed option of surgical excision to remove it if it is growing, symptomatic, or other changes noted. Please call for new or changing lesions so they can be evaluated.  SEBORRHEIC DERMATITIS with Tinea Versicolor  Forehead; face; scalp Exam: Pink patches with greasy scale on his face and scalp and hypopigmented patches  Chronic and persistent condition with duration or expected duration over one year. Condition is symptomatic/ bothersome to patient. Not currently at goal.  Seborrheic Dermatitis is a chronic persistent rash characterized by pinkness and scaling most commonly of the mid face but also can occur on the scalp (dandruff), ears; mid chest, mid back and groin.  It tends to be exacerbated by stress and cooler weather.  People who have neurologic disease may experience new onset or exacerbation of existing seborrheic dermatitis.  The condition is not curable but treatable and can be controlled.  Treatment Plan: Start Ketoconazole  shampoo at least once a week, massage into scalp and leave in for 10 minutes before rinsing out    Start Ketoconazole  cream apply to face and ears Monday, Wednesday and Friday  Start Hydrocortisone  cream apply to face and ears Tuesday, Thursday and Saturday   TINEA PEDIS Chronic and persistent condition with duration or expected duration over one  year. Condition is symptomatic / bothersome to patient. Not to goal. Exam: Scaling and maceration web spaces and over distal and lateral soles. 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  - Call if any new or changing lesions are noted between office visits   Return in about 1 year (around 04/23/2025) for TBSE, hx of SCC.  IFay Kirks, CMA, am acting as scribe for Alm Rhyme, MD .   Documentation: I have reviewed the above documentation for accuracy and completeness, and I agree with the above.  Alm Rhyme, MD

## 2024-04-23 NOTE — Patient Instructions (Addendum)
 FOR rash on scalp, face and ears   Start Ketoconazole  shampoo at least once a week, massage into scalp and leave in for 10 minutes before rinsing out    Start Ketoconazole  cream apply to face and ears Monday, Wednesday and Friday  Start Hydrocortisone   cream apply to face and ears Tuesday, Thursday and Saturday   For rash on feet and rash on face  Take 1 Diflucan  200 mg tablet on Monday, Wednesday and Friday for 1 month then stop Diflucan  tablets Reviewed risks/benefits. Cure rate ~50%, and recurrences common. Side effects discussed GI upset, headache, dizziness, taste abnormalities, hepatotoxicity, medication interactions. Patient agrees to abstain from alcohol.     Due to recent changes in healthcare laws, you may see results of your pathology and/or laboratory studies on MyChart before the doctors have had a chance to review them. We understand that in some cases there may be results that are confusing or concerning to you. Please understand that not all results are received at the same time and often the doctors may need to interpret multiple results in order to provide you with the best plan of care or course of treatment. Therefore, we ask that you please give us  2 business days to thoroughly review all your results before contacting the office for clarification. Should we see a critical lab result, you will be contacted sooner.   If You Need Anything After Your Visit  If you have any questions or concerns for your doctor, please call our main line at [phone] and press option 4 to reach your doctor's medical assistant. If no one answers, please leave a voicemail as directed and we will return your call as soon as possible. Messages left after 4 pm will be answered the following business day.   You may also send us  a message via MyChart. We typically respond to MyChart messages within 1-2 business days.  For prescription refills, please ask your pharmacy to contact our office. Our  fax number is [phone].  If you have an urgent issue when the clinic is closed that cannot wait until the next business day, you can page your doctor at the number below.    Please note that while we do our best to be available for urgent issues outside of office hours, we are not available 24/7.   If you have an urgent issue and are unable to reach us , you may choose to seek medical care at your doctor's office, retail clinic, urgent care center, or emergency room.  If you have a medical emergency, please immediately call 911 or go to the emergency department.  Pager Numbers  - Dr. Hester: [phone]  - Dr. Jackquline: [phone]  - Dr. Claudene: [phone]   - Dr. Raymund: [phone]  In the event of inclement weather, please call our main line at [phone] for an update on the status of any delays or closures.  Dermatology Medication Tips: Please keep the boxes that topical medications come in in order to help keep track of the instructions about where and how to use these. Pharmacies typically print the medication instructions only on the boxes and not directly on the medication tubes.   If your medication is too expensive, please contact our office at [phone] option 4 or send us  a message through MyChart.   We are unable to tell what your co-pay for medications will be in advance as this is different depending on your insurance coverage. However, we may be able to find a substitute  medication at lower cost or fill out paperwork to get insurance to cover a needed medication.   If a prior authorization is required to get your medication covered by your insurance company, please allow us  1-2 business days to complete this process.  Drug prices often vary depending on where the prescription is filled and some pharmacies may offer cheaper prices.  The website www.goodrx.com contains coupons for medications through different pharmacies. The prices here do not account for  what the cost may be with help from insurance (it may be cheaper with your insurance), but the website can give you the price if you did not use any insurance.  - You can print the associated coupon and take it with your prescription to the pharmacy.  - You may also stop by our office during regular business hours and pick up a GoodRx coupon card.  - If you need your prescription sent electronically to a different pharmacy, notify our office through [facility] or by phone at [phone] option 4.     Si Usted Necesita Algo Despus de Su Visita  Tambin puede enviarnos un mensaje a travs de Clinical cytogeneticist. Por lo general respondemos a los mensajes de MyChart en el transcurso de 1 a 2 das hbiles.  Para renovar recetas, por favor pida a su farmacia que se ponga en contacto con nuestra oficina. Randi lakes de fax es [place] [phone].  Si tiene un asunto urgente cuando la clnica est cerrada y que no puede esperar hasta el siguiente da hbil, puede llamar/localizar a su doctor(a) al nmero que aparece a continuacin.   Por favor, tenga en cuenta que aunque hacemos todo lo posible para estar disponibles para asuntos urgentes fuera del horario de [place], no estamos disponibles las 24 horas del da, los 7 [address]  [address] de la [place].   Si tiene un problema urgente y no puede comunicarse con nosotros, puede optar por buscar atencin mdica  en el consultorio de su doctor(a), en una clnica privada, en un centro de atencin urgente o en una sala de emergencias.  Si tiene Engineer, drilling, por favor llame inmediatamente al 911 o vaya a la sala de emergencias.  Nmeros de bper  - Dr. Hester: [phone]  - Dra. Jackquline: [phone]  - Dr. Claudene: [phone]  - Dra. Kitts: [phone]  En caso de inclemencias del [place], por favor llame a nuestra lnea principal al [phone] para una actualizacin sobre el estado de cualquier retraso o cierre.  Consejos para la medicacin en  dermatologa: Por favor, guarde las cajas en las que vienen los medicamentos de uso tpico para ayudarle a seguir las instrucciones sobre dnde y cmo usarlos. Las farmacias generalmente imprimen las instrucciones del medicamento slo en las cajas y no directamente en los tubos del [place].   Si su medicamento es muy caro, por favor, pngase en contacto con landry rieger llamando al [phone] y presione la opcin 4 o envenos un mensaje a travs de Clinical cytogeneticist.   No podemos decirle cul ser su copago por los medicamentos por adelantado ya que esto es diferente dependiendo de la cobertura de su seguro. Sin embargo, es posible que podamos encontrar un medicamento sustituto a Audiological scientist un formulario para que el seguro cubra el medicamento que se considera necesario.   Si se requiere una autorizacin previa para que su compaa de seguros malta su medicamento, por favor permtanos de 1 a 2 das hbiles para completar este proceso.  Los precios de los medicamentos varan con  frecuencia dependiendo del lugar de dnde se surte la receta y alguna farmacias pueden ofrecer precios ms baratos.  El sitio web www.goodrx.com tiene cupones para medicamentos de Health and safety inspector. Los precios aqu no tienen en cuenta lo que podra costar con la ayuda del seguro (puede ser ms barato con su seguro), pero el sitio web puede darle el precio si no utiliz Tourist information centre manager.  - Puede imprimir el cupn correspondiente y llevarlo con su receta a la farmacia.  - Tambin puede pasar por nuestra oficina durante el horario de atencin regular y Education officer, museum una tarjeta de cupones de GoodRx.  - Si necesita que su receta se enve electrnicamente a una farmacia diferente, informe a nuestra oficina a travs de MyChart de [place] o por telfono llamando al [phone] y presione la opcin 4.

## 2024-04-30 ENCOUNTER — Other Ambulatory Visit: Payer: Self-pay | Admitting: Family Medicine

## 2024-04-30 DIAGNOSIS — E1121 Type 2 diabetes mellitus with diabetic nephropathy: Secondary | ICD-10-CM

## 2024-05-01 ENCOUNTER — Other Ambulatory Visit: Payer: Self-pay | Admitting: Family Medicine

## 2024-05-01 DIAGNOSIS — I10 Essential (primary) hypertension: Secondary | ICD-10-CM

## 2024-05-01 NOTE — Telephone Encounter (Signed)
 Requested Prescriptions  Pending Prescriptions Disp Refills   insulin  lispro (HUMALOG  KWIKPEN) 100 UNIT/ML KwikPen [Pharmacy Med Name: HumaLOG  KwikPen Subcutaneous Solution Pen-injector 100 UNIT/ML] 15 mL 3    Sig: DIAL  AND INJECT 10 UNITS UNDER THE SKIN THREE TIMES A DAY.     Endocrinology:  Diabetes - Insulins Passed - 05/01/2024 11:32 AM      Passed - HBA1C is between 0 and 7.9 and within 180 days    Hgb A1c MFr Bld  Date Value Ref Range Status  03/19/2024 7.8 (H) <5.7 % Final    Comment:    For someone without known diabetes, a hemoglobin A1c value of 6.5% or greater indicates that they may have  diabetes and this should be confirmed with a follow-up  test. . For someone with known diabetes, a value <7% indicates  that their diabetes is well controlled and a value  greater than or equal to 7% indicates suboptimal  control. A1c targets should be individualized based on  duration of diabetes, age, comorbid conditions, and  other considerations. . Currently, no consensus exists regarding use of hemoglobin A1c for diagnosis of diabetes for children. SABRA Amy - Valid encounter within last 6 months    Recent Outpatient Visits           1 week ago Impacted cerumen of both ears   [place] [facility] Leavy Mole, PA-C   1 month ago Generalized abdominal pain   [place] [facility] Leavy Mole, PA-C   4 months ago Type 2 diabetes mellitus with diabetic nephropathy, with long-term current use of insulin  [facility])   [place] [facility] Leavy Mole, PA-C       Future Appointments             In 1 month Leavy Mole, PA-C [facility] [place]   In 3 months McGowan, Clotilda DELENA RIGGERS [facility] [place]   In 12 months Hester Alm BROCKS, MD [facility]

## 2024-05-01 NOTE — Telephone Encounter (Signed)
 Requested Prescriptions  Pending Prescriptions Disp Refills   amLODipine  (NORVASC ) 10 MG tablet [Pharmacy Med Name: AMLODIPINE  BESYLATE 10 MG TAB] 90 tablet 0    Sig: TAKE 1 TABLET BY MOUTH EVERY DAY     Cardiovascular: Calcium  Channel Blockers 2 Passed - 05/01/2024  4:06 PM      Passed - Last BP in normal range    BP Readings from Last 1 Encounters:  04/20/24 120/78         Passed - Last Heart Rate in normal range    Pulse Readings from Last 1 Encounters:  04/20/24 78         Passed - Valid encounter within last 6 months    Recent Outpatient Visits           1 week ago Impacted cerumen of both ears   [facility] Leavy Mole, PA-C   1 month ago Generalized abdominal pain   [place] [facility] Leavy Mole, PA-C   4 months ago Type 2 diabetes mellitus with diabetic nephropathy, with long-term current use of insulin  [facility])   [place] [facility] Leavy Mole, PA-C       Future Appointments             In 1 month Leavy Mole, PA-C [facility] [place]   In 3 months McGowan, Clotilda DELENA RIGGERS [facility] [place]   In 12 months Hester Alm BROCKS, MD [facility]

## 2024-05-25 ENCOUNTER — Encounter: Payer: Self-pay | Admitting: Podiatry

## 2024-05-25 ENCOUNTER — Ambulatory Visit (INDEPENDENT_AMBULATORY_CARE_PROVIDER_SITE_OTHER): Admitting: Podiatry

## 2024-05-25 DIAGNOSIS — Z794 Long term (current) use of insulin: Secondary | ICD-10-CM | POA: Diagnosis not present

## 2024-05-25 DIAGNOSIS — M2011 Hallux valgus (acquired), right foot: Secondary | ICD-10-CM

## 2024-05-25 DIAGNOSIS — M2012 Hallux valgus (acquired), left foot: Secondary | ICD-10-CM | POA: Diagnosis not present

## 2024-05-25 DIAGNOSIS — E1121 Type 2 diabetes mellitus with diabetic nephropathy: Secondary | ICD-10-CM | POA: Diagnosis not present

## 2024-05-25 DIAGNOSIS — M79674 Pain in right toe(s): Secondary | ICD-10-CM

## 2024-05-25 DIAGNOSIS — M79675 Pain in left toe(s): Secondary | ICD-10-CM

## 2024-05-25 DIAGNOSIS — B351 Tinea unguium: Secondary | ICD-10-CM

## 2024-05-25 NOTE — Progress Notes (Signed)
 Subjective:  Patient ID: Edgar [name], male    DOB: 1946-07-23,  MRN: [id number]  Edgar [name] presents to clinic today for at risk foot care. Pt has h/o NIDDM with chronic kidney disease and painful mycotic toenails of both feet that are difficult to trim. Pain interferes with daily activities and wearing enclosed shoe gear comfortably. Patient is requesting Rx for diabetic shoes. Chief Complaint  Patient presents with   Toe Pain    Diabetic foot care. A1c is 7.8. PA-C Paia is his PCP. Last visit was in Sept.   New problem(s): None.   PCP is Edgar Mole, PA-C.  No Known Allergies  Review of Systems: Negative except as noted in the HPI.  Objective: No changes noted in today's physical examination. There were no vitals filed for this visit. Edgar [name] is a pleasant 78 y.o. male morbidly obese in NAD. AAO x 3.  Vascular Examination: Capillary refill time immediate b/l. Palpable pedal pulses. Pedal hair diminished b/l. Pedal edema trace b/l. No pain with calf compression b/l. Skin temperature gradient WNL b/l. No cyanosis or clubbing b/l. No ischemia or gangrene noted b/l.   Neurological Examination: Sensation grossly intact b/l with 10 gram monofilament. Vibratory sensation intact b/l.   Dermatological Examination: Pedal skin with normal turgor, texture and tone b/l.  No open wounds. No interdigital macerations.   Toenails 1-5 b/l thick, discolored, elongated with subungual debris and pain on dorsal palpation.   No corns, calluses, nor porokeratotic lesions.  Musculoskeletal Examination: Muscle strength 5/5 to all lower extremity muscle groups bilaterally. HAV with bunion deformity noted b/l LE. Utilizes cane for ambulation assistance.  Radiographs: None  Last A1c:      Latest Ref Rng & Units 03/19/2024    9:53 AM 12/04/2023    9:43 AM 08/22/2023   11:02 AM  Hemoglobin A1C  Hemoglobin-A1c <5.7 % 7.8  6.5  7.3    Assessment/Plan: 1. Pain due to onychomycosis of  toenails of both feet   2. Hallux valgus, acquired, bilateral   3. Type 2 diabetes mellitus with diabetic nephropathy, with long-term current use of insulin  (HCC)    Orders Placed This Encounter  Procedures   For home use only DME Other see comment    To [facility]: Dispense one pair extra depth shoes and 3 pair total contact insoles.    Length of Need:   12 Months  -Patient was evaluated today. All questions/concerns addressed on today's visit. -Continue foot and shoe inspections daily. Monitor blood glucose per PCP/Endocrinologist's recommendations. -Patient to continue soft, supportive shoe gear daily. -Rx to be sent to [facility] for  one pair diabetic shoes and 3 pair total contact insoles. To [facility] [address] [place], [place] [address] Phone: [phone] Fax: [phone]. -Mycotic toenails 1-5 bilaterally were debrided in length and girth with sterile nail nippers and dremel without incident. -Patient/POA to call should there be question/concern in the interim.   Return in about 3 months (around 08/25/2024).  Delon LITTIE Merlin, DPM      [facility] LOCATION: [address], [place] [address]  Office [phone]   [facility] LOCATION: [address] [place], [place] [address] Office [phone]

## 2024-06-04 ENCOUNTER — Ambulatory Visit: Admitting: Family Medicine

## 2024-06-09 ENCOUNTER — Telehealth: Payer: Self-pay | Admitting: Family Medicine

## 2024-06-09 NOTE — Telephone Encounter (Signed)
 Copied from CRM [id number]. Topic: General - Other >> Jun 09, 2024 11:27 AM Tinnie BROCKS wrote: Reason for CRM: Shante with [facility] calling to see if we received fax from them requesting MD statement of certification for diabetic shoes and most recent office notes. They state it was faxed on 10/21 and 10/27, # confirmed correct, but nothing seen in chart. She will refex. Please keep on the lookout. If you need to contact her, the best call back # is [phone]

## 2024-06-09 NOTE — Telephone Encounter (Signed)
 Paperwork not received yet, will keep lookout for it.

## 2024-07-09 NOTE — Progress Notes (Unsigned)
 07/10/2024 Edgar [name] [id number] 06/29/1946  Gastroenterology Office Note    Referring Provider: Leavy Mole, PA-C Primary Care Physician:  Leavy Mole, PA-C (Inactive)  Primary GI Provider: Jinny Carmine, MD    Chief Complaint   Chief Complaint  Patient presents with   New Patient (Initial Visit)    Constipation -takes laxative and miralax  but does not help-no fiber supplements     History of Present Illness   Edgar [name] is a 78 y.o. male with PMHX of HTN, diabetes, obesity presenting today at the request of Tapia, Leisa, PA-C due to abdominal pain and constipation  Patient accompanied by daughter.  He was seen in 02/2024 for abdominal pain and constipation. He reports that he cannot get his bowels regular.  He is no longer having abdominal pain but bowels are still not regular.  Daughter reports that even after the ER visit constipation has improved but there have still been some times where he would go 9 days without a bowel movement. He was trying MiraLAX  and prune juice every day but then he states it was not working.  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  He reports he did have a bowel movement on Wednesday and Thursday but it was not a whole lot, sometimes it is hard balls and a lot of times he has to strain.  Denies melena or hematochezia.  He is not drinking a lot of water daily, he drinks juice and a Sprite most of the time.   Patient seen by PCP twice since emergency room visit.  Most recent visit he is titrating MiraLAX  to achieve soft daily stools currently no constipation abdominal pain or watery bowel movements.  Patient was seen in the emergency room on 02/21/2024 for constipation, no bowel movement in 6 days. Abdominal films showed no signs of obstructive bowel gas pattern. Moderate stool burden.   Due for colonoscopy 12/2024.  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   Closed intertrochanteric fracture of left hip, sequela 05/30/2016   Coronary artery disease 10/25/2017   CT scan March 2019   Diabetes mellitus with diabetic nephropathy (HCC)    Diabetes mellitus without complication (HCC)    Essential hypertension, benign 12/23/2015   History of colonoscopy with polypectomy 12/07/2016   Polypectomy x 12 per patient   HOH (hard of hearing)    Hx of pulmonary embolus 05/18/2016   Hospitalization Sept 2017, following hip fracture   Hx of tobacco use, presenting hazards to health 12/23/2015   Quit prior to 1997   Hx of tobacco use, presenting hazards to health 12/23/2015   Quit prior to 1997    Hyperlipidemia 12/23/2015   Hypertension    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   Obesity 12/23/2015   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   Pulmonary embolism (HCC) 05/18/2016   Hospitalization Sept 2017, following hip fracture   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   left ant lat thigh, SCCis in a SK, schedule EDC   Squamous cell carcinoma of skin 08/14/2021   L ant lat thigh, EDC 09/07/21   Tinea pedis 06/25/2016   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     Past Surgical History:  Procedure Laterality Date  CATARACT EXTRACTION W/PHACO Right 11/26/2023   Procedure: PHACOEMULSIFICATION, CATARACT, WITH IOL INSERTION 6.85 00:43.9;  Surgeon: Jaye Fallow, MD;  Location: [facility];  Service: Ophthalmology;  Laterality: Right;   CIRCUMCISION N/A 03/26/2022   Procedure: CIRCUMCISION ADULT;  Surgeon: Penne Knee, MD;  Location: [facility];  Service: Urology;  Laterality: N/A;   COLONOSCOPY WITH PROPOFOL  N/A 01/15/2017   Procedure: COLONOSCOPY WITH PROPOFOL ;  Surgeon: Therisa Bi, MD;  Location[facility];  Service: Endoscopy;  Laterality: N/A;   COLONOSCOPY WITH PROPOFOL  N/A 12/17/2019    Procedure: COLONOSCOPY WITH PROPOFOL ;  Surgeon: Therisa Bi, MD;  Location: [facility];  Service: Gastroenterology;  Laterality: N/A;   EYE SURGERY Bilateral    cataract surgery   FRACTURE SURGERY     INTRAMEDULLARY (IM) NAIL INTERTROCHANTERIC Left 04/14/2016   Procedure: INTRAMEDULLARY (IM) NAIL INTERTROCHANTRIC;  Surgeon: Norleen JINNY Maltos, MD;  Location: [facility];  Service: Orthopedics;  Laterality: Left;   JOINT REPLACEMENT Left 04/2016   TOTAL HIP ARTHROPLASTY      Current Outpatient Medications  Medication Sig Dispense Refill   amLODipine  (NORVASC ) 10 MG tablet TAKE 1 TABLET BY MOUTH EVERY DAY 90 tablet 0   aspirin  EC 81 MG tablet Take 81 mg by mouth daily. Swallow whole.     atorvastatin  (LIPITOR) 40 MG tablet TAKE 1 TABLET BY MOUTH EVERYDAY AT BEDTIME 90 tablet 1   dapagliflozin  propanediol (FARXIGA ) 10 MG TABS tablet Take 1 tablet (10 mg total) by mouth in the morning. 30 tablet 5   GVOKE HYPOPEN  2-PACK 1 MG/0.2ML SOAJ Inject 1 mg into the skin as needed (for hypoglycemia episodes (glucose below 60 and symptomatic)). 0.4 mL 1   insulin  glargine (LANTUS  SOLOSTAR) 100 UNIT/ML Solostar Pen Inject 50-70 Units into the skin at bedtime. 30 mL 5   insulin  lispro (HUMALOG  KWIKPEN) 100 UNIT/ML KwikPen DIAL  AND INJECT 10 UNITS UNDER THE SKIN THREE TIMES A DAY. 15 mL 3   losartan  (COZAAR ) 100 MG tablet Take 1 tablet (100 mg total) by mouth daily. 90 tablet 1   magnesium  citrate SOLN Take 1 Bottle by mouth once.     MAGnesium -Oxide 400 (240 Mg) MG tablet Take 1 tablet (400 mg total) by mouth daily as needed. 90 tablet 1   polyethylene glycol powder (GLYCOLAX /MIRALAX ) 17 GM/SCOOP powder Take 17 g by mouth daily as needed for moderate constipation or severe constipation. Until daily soft stools 578 g 2   Trospium  Chloride 60 MG CP24 Take 1 capsule (60 mg total) by mouth daily. 90 capsule 3   No current facility-administered medications for this visit.    Allergies as of 07/10/2024   (No  Known Allergies)    Family History  Problem Relation Age of Onset   Cancer Mother    Diabetes Mother    Heart disease Father    Alcohol abuse Brother     Social History   Socioeconomic History   Marital status: Widowed    Spouse name: Not on file   Number of children: Not on file   Years of education: Not on file   Highest education level: GED or equivalent  Occupational History   Not on file  Tobacco Use   Smoking status: Former    Current packs/day: 0.00    Types: Cigarettes    Quit date: 09/01/1978    Years since quitting: 45.8   Smokeless tobacco: Never  Vaping Use   Vaping status: Never Used  Substance and Sexual Activity   Alcohol use: No  Alcohol/week: 0.0 standard drinks of alcohol   Drug use: No   Sexual activity: Not Currently  Other Topics Concern   Not on file  Social History Narrative   Pt's daughter lives with him   Social Drivers of Health   Financial Resource Strain: Patient Declined (03/19/2024)   Overall Financial Resource Strain (CARDIA)    Difficulty of Paying Living Expenses: Patient declined  Food Insecurity: Patient Declined (03/19/2024)   Hunger Vital Sign    Worried About Running Out of Food in the Last Year: Patient declined    Ran Out of Food in the Last Year: Patient declined  Transportation Needs: Patient Declined (03/19/2024)   PRAPARE - Administrator, Civil Service (Medical): Patient declined    Lack of Transportation (Non-Medical): Patient declined  Physical Activity: Insufficiently Active (03/19/2024)   Exercise Vital Sign    Days of Exercise per Week: 1 day    Minutes of Exercise per Session: 10 min  Stress: No Stress Concern Present (03/19/2024)   [employer] of Occupational Health - Occupational Stress Questionnaire    Feeling of Stress: Only a little  Social Connections: Unknown (03/19/2024)   Social Connection and Isolation Panel    Frequency of Communication with Friends and Family: More than three times  a week    Frequency of Social Gatherings with Friends and Family: More than three times a week    Attends Religious Services: Patient declined    Database Administrator or Organizations: Patient declined    Attends Banker Meetings: Not on file    Marital Status: Widowed  Intimate Partner Violence: Not At Risk (04/12/2023)   Humiliation, Afraid, Rape, and Kick questionnaire    Fear of Current or Ex-Partner: No    Emotionally Abused: No    Physically Abused: No    Sexually Abused: No     RELEVANT GI HISTORY, IMAGING AND LABS: CBC    Component Value Date/Time   WBC 8.1 02/21/2024 1804   RBC 5.20 02/21/2024 1804   HGB 14.8 02/21/2024 1804   HCT 44.2 02/21/2024 1804   PLT 211 02/21/2024 1804   MCV 85.0 02/21/2024 1804   MCH 28.5 02/21/2024 1804   MCHC 33.5 02/21/2024 1804   RDW 14.0 02/21/2024 1804   LYMPHSABS 2.8 02/21/2024 1804   MONOABS 0.7 02/21/2024 1804   EOSABS 0.2 02/21/2024 1804   BASOSABS 0.1 02/21/2024 1804   Recent Labs    08/22/23 1102 02/21/24 1804  HGB 14.8 14.8    CMP     Component Value Date/Time   NA 140 03/19/2024 0953   NA 139 01/06/2016 0918   K 4.4 03/19/2024 0953   CL 106 03/19/2024 0953   CO2 28 03/19/2024 0953   GLUCOSE 140 (H) 03/19/2024 0953   BUN 12 03/19/2024 0953   BUN 16 01/06/2016 0918   CREATININE 0.98 03/19/2024 0953   CALCIUM  10.5 (H) 03/19/2024 0953   PROT 7.2 03/19/2024 0953   PROT 7.5 12/23/2015 0959   ALBUMIN 3.7 02/21/2024 1804   ALBUMIN 3.8 01/06/2016 0918   AST 16 03/19/2024 0953   ALT 17 03/19/2024 0953   ALKPHOS 69 02/21/2024 1804   BILITOT 0.6 03/19/2024 0953   BILITOT 0.5 12/23/2015 0959   GFRNONAA >60 02/21/2024 1804   GFRNONAA 69 08/01/2020 0946   GFRAA 80 08/01/2020 0946      Latest Ref Rng & Units 03/19/2024    9:53 AM 02/21/2024    6:04 PM 08/22/2023   11:02  AM  Hepatic Function  Total Protein 6.1 - 8.1 g/dL 7.2  7.9  7.8   Albumin 3.5 - 5.0 g/dL  3.7    AST 10 - 35 U/L 16  20  16    ALT 9  - 46 U/L 17  16  15    Alk Phosphatase 38 - 126 U/L  69    Total Bilirubin 0.2 - 1.2 mg/dL 0.6  1.0  0.7       Review of Systems   All systems reviewed and negative except where noted in HPI.    Physical Exam  BP 135/82   Pulse 74   Temp 98.5 F (36.9 C)   Ht 6' 6 (1.981 m)   Wt 268 lb (121.6 kg)   SpO2 95%   BMI 30.97 kg/m  No LMP for male patient. General:   Alert and oriented. Pleasant and cooperative. Well-nourished and well-developed.  In no acute distress Head:  Normocephalic and atraumatic. Eyes:  Without icterus Ears:  Normal auditory acuity. Neck:  Supple; no masses or thyromegaly. Lungs:  Respirations even and unlabored.  Clear throughout to auscultation.   No wheezes, crackles, or rhonchi. No acute distress. Heart:  Regular rate and rhythm; no murmurs, clicks, rubs, or gallops. Abdomen:  Normal bowel sounds.  No bruits.  Soft, non-tender and non-distended without masses, hepatosplenomegaly or hernias noted.  Large body habitus. No guarding or rebound tenderness. Rectal:  Deferred Msk:  Symmetrical without gross deformities. Normal posture. Extremities:  Without edema. Neurologic:  Alert and  oriented x4;  grossly normal neurologically. Skin:  Intact without significant lesions or rashes. Psych:  Alert and cooperative. Normal mood and affect.   Assessment & Plan   Edgar [name] is a 78 y.o. male presenting today with chronic constipation.  He is not having abdominal pain.  Chronic constipation - Discussed constipation treatment at length. - Will check abdominal x-ray for stool burden.  Plan for bowel purge if needed  - Recommend High Fiber diet with fruits, vegetables, and whole grains. -Add Benefiber and drink 64 ounces of Fluids Daily. - Will trial Linzess.  Patient given samples of 72 mcg and 145 mcg.  Daughter will call back and let us  know what works for him.    I discussed the assessment and treatment plan with the patient. The patient was provided an  opportunity to ask questions and all were answered. The patient agreed with the plan and demonstrated an understanding of the instructions.   The patient was advised to call back or seek an in-person evaluation if the symptoms worsen or if the condition fails to improve as anticipated.  Follow-up in 6 weeks  Grayce Bohr, DNP, AGNP-C [facility]

## 2024-07-10 ENCOUNTER — Encounter: Payer: Self-pay | Admitting: Family Medicine

## 2024-07-10 ENCOUNTER — Ambulatory Visit: Payer: Self-pay | Admitting: Family Medicine

## 2024-07-10 ENCOUNTER — Ambulatory Visit
Admission: RE | Admit: 2024-07-10 | Discharge: 2024-07-10 | Disposition: A | Source: Ambulatory Visit | Attending: Family Medicine | Admitting: Family Medicine

## 2024-07-10 ENCOUNTER — Ambulatory Visit: Admitting: Family Medicine

## 2024-07-10 ENCOUNTER — Ambulatory Visit
Admission: RE | Admit: 2024-07-10 | Discharge: 2024-07-10 | Disposition: A | Attending: Family Medicine | Admitting: Family Medicine

## 2024-07-10 VITALS — BP 135/82 | HR 74 | Temp 98.5°F | Ht 78.0 in | Wt 268.0 lb

## 2024-07-10 DIAGNOSIS — K5900 Constipation, unspecified: Secondary | ICD-10-CM

## 2024-07-10 NOTE — Patient Instructions (Addendum)
 If abdominal xray shows stool burden: Please do the following: Purchase a bottle of Miralax  over the counter as well as a box of 5 mg dulcolax tablets. Take 4 dulcolax tablets. Wait 1 hour. You will then drink 6-8 capfuls of Miralax  mixed in an adequate amount of water/juice/gatorade (you may choose which of these liquids to drink) over the next 2-3 hours. You should expect results within 1 to 6 hours after completing the bowel purge. Go to the er if you have severe AB pain, can not pass gas or stool in over 12 hours, can not hold down any food.   Outpatient [facility] [address] [place] [address]

## 2024-07-24 ENCOUNTER — Encounter

## 2024-07-28 ENCOUNTER — Other Ambulatory Visit: Payer: Self-pay | Admitting: Internal Medicine

## 2024-07-28 DIAGNOSIS — Z794 Long term (current) use of insulin: Secondary | ICD-10-CM

## 2024-07-28 NOTE — Telephone Encounter (Signed)
 Copied from CRM #[id number]. Topic: Clinical - Medication Refill >> Jul 28, 2024  9:31 AM Kevelyn M wrote: Medication: insulin  lispro (HUMALOG  KWIKPEN) 100 UNIT/ML, insulin  glargine (LANTUS  SOLOSTAR) 100 UNIT/ML Solostar Pen  Has the patient contacted their pharmacy? Yes (Agent: If no, request that the patient contact the pharmacy for the refill. If patient does not wish to contact the pharmacy document the reason why and proceed with request.) (Agent: If yes, when and what did the pharmacy advise?)  This is the patient's preferred pharmacy:  [id number] [place], [place] - [address] [place] [address] Phone: [phone] Fax: [phone]  Is this the correct pharmacy for this prescription? yes If no, delete pharmacy and type the correct one.   Has the prescription been filled recently? No  Is the patient out of the medication? Yes  Has the patient been seen for an appointment in the last year OR does the patient have an upcoming appointment? Yes  Can we respond through MyChart? Yes  Agent: Please be advised that Rx refills may take up to 3 business days. We ask that you follow-up with your pharmacy.

## 2024-07-29 MED ORDER — LANTUS SOLOSTAR 100 UNIT/ML ~~LOC~~ SOPN: 50.0000 [IU] | PEN_INJECTOR | Freq: Every day | SUBCUTANEOUS | 1 refills | Status: DC

## 2024-07-29 MED ORDER — INSULIN LISPRO (1 UNIT DIAL) 100 UNIT/ML (KWIKPEN): 10.0000 [IU] | PEN_INJECTOR | Freq: Three times a day (TID) | SUBCUTANEOUS | 1 refills | Status: DC

## 2024-07-29 NOTE — Telephone Encounter (Signed)
 Requested Prescriptions  Pending Prescriptions Disp Refills   insulin  lispro (HUMALOG  KWIKPEN) 100 UNIT/ML KwikPen 15 mL 1    Sig: Inject 10 Units into the skin 3 (three) times daily.     Endocrinology:  Diabetes - Insulins Passed - 07/29/2024  2:58 PM      Passed - HBA1C is between 0 and 7.9 and within 180 days    Hgb A1c MFr Bld  Date Value Ref Range Status  03/19/2024 7.8 (H) <5.7 % Final    Comment:    For someone without known diabetes, a hemoglobin A1c value of 6.5% or greater indicates that they may have  diabetes and this should be confirmed with a follow-up  test. . For someone with known diabetes, a value <7% indicates  that their diabetes is well controlled and a value  greater than or equal to 7% indicates suboptimal  control. A1c targets should be individualized based on  duration of diabetes, age, comorbid conditions, and  other considerations. . Currently, no consensus exists regarding use of hemoglobin A1c for diagnosis of diabetes for children. SABRA Amy - Valid encounter within last 6 months    Recent Outpatient Visits           3 months ago Impacted cerumen of both ears   [facility] Leavy Mole, PA-C   4 months ago Generalized abdominal pain   [facility] Leavy Mole, PA-C   7 months ago Type 2 diabetes mellitus with diabetic nephropathy, with long-term current use of insulin  [facility])   [facility] Leavy Mole, PA-C       Future Appointments             In 1 month McGowan, Clotilda DELENA RIGGERS [facility] [place]   In 9 months Hester Alm BROCKS, MD [facility]             insulin  glargine (LANTUS  SOLOSTAR) 100 UNIT/ML Solostar Pen 30 mL 5    Sig: Inject 50-70 Units into the skin at bedtime.     Endocrinology:  Diabetes - Insulins Passed - 07/29/2024  2:58 PM      Passed - HBA1C is between 0 and 7.9 and within 180 days     Hgb A1c MFr Bld  Date Value Ref Range Status  03/19/2024 7.8 (H) <5.7 % Final    Comment:    For someone without known diabetes, a hemoglobin A1c value of 6.5% or greater indicates that they may have  diabetes and this should be confirmed with a follow-up  test. . For someone with known diabetes, a value <7% indicates  that their diabetes is well controlled and a value  greater than or equal to 7% indicates suboptimal  control. A1c targets should be individualized based on  duration of diabetes, age, comorbid conditions, and  other considerations. . Currently, no consensus exists regarding use of hemoglobin A1c for diagnosis of diabetes for children. SABRA Amy - Valid encounter within last 6 months    Recent Outpatient Visits           3 months ago Impacted cerumen of both ears    [facility] Leavy Mole, PA-C   4 months ago Generalized abdominal pain   [facility] [place], [place], [place]   7 months ago Type 2 diabetes mellitus  with diabetic nephropathy, with long-term current use of insulin  [facility])   [place] [facility] Leavy Mole, PA-C       Future Appointments             In 1 month McGowan, Clotilda DELENA RIGGERS [facility] [place]   In 9 months Hester Alm BROCKS, MD [facility]

## 2024-07-31 ENCOUNTER — Other Ambulatory Visit (HOSPITAL_COMMUNITY): Payer: Self-pay

## 2024-07-31 ENCOUNTER — Telehealth: Payer: Self-pay

## 2024-07-31 NOTE — Telephone Encounter (Signed)
 Copied from CRM #[id number]. Topic: Clinical - Prescription Issue >> Jul 31, 2024 12:42 PM Selinda RAMAN wrote: Reason for CRM: Corean the daughter and POA called in stating the only thing that her father needs is the actually pen needles and test strips for both the Lantus  Solostar and his Humalog  Kwikpen. She says he is good with the insulin  itself he just needs the needles and test strips. Please assist patient further and let her know when this is taken care of. These both need to go to his local pharmacy   [id number] [place], [place] - [address]  Phone: [phone] Fax: [phone]

## 2024-08-04 MED ORDER — INSULIN PEN NEEDLE 31G X 5 MM MISC: 2 refills | Status: AC

## 2024-08-04 MED ORDER — BLOOD GLUCOSE TEST STRIPS 333 VI STRP: ORAL_STRIP | 5 refills | Status: AC

## 2024-08-04 NOTE — Addendum Note (Signed)
 Addended by: YVONE PIERCE C on: 08/04/2024 10:30 AM   Modules accepted: Orders

## 2024-08-04 NOTE — Telephone Encounter (Signed)
RX sent daughter notified

## 2024-08-13 ENCOUNTER — Other Ambulatory Visit: Payer: Self-pay | Admitting: Family Medicine

## 2024-08-13 ENCOUNTER — Ambulatory Visit: Admitting: Family Medicine

## 2024-08-13 ENCOUNTER — Encounter: Payer: Self-pay | Admitting: Family Medicine

## 2024-08-13 VITALS — BP 124/76 | HR 66 | Resp 16 | Ht 78.0 in | Wt 272.0 lb

## 2024-08-13 DIAGNOSIS — E782 Mixed hyperlipidemia: Secondary | ICD-10-CM | POA: Diagnosis not present

## 2024-08-13 DIAGNOSIS — I7121 Aneurysm of the ascending aorta, without rupture: Secondary | ICD-10-CM

## 2024-08-13 DIAGNOSIS — I152 Hypertension secondary to endocrine disorders: Secondary | ICD-10-CM

## 2024-08-13 DIAGNOSIS — E1165 Type 2 diabetes mellitus with hyperglycemia: Secondary | ICD-10-CM | POA: Diagnosis not present

## 2024-08-13 DIAGNOSIS — I2510 Atherosclerotic heart disease of native coronary artery without angina pectoris: Secondary | ICD-10-CM | POA: Diagnosis not present

## 2024-08-13 DIAGNOSIS — E1159 Type 2 diabetes mellitus with other circulatory complications: Secondary | ICD-10-CM | POA: Diagnosis not present

## 2024-08-13 DIAGNOSIS — E1121 Type 2 diabetes mellitus with diabetic nephropathy: Secondary | ICD-10-CM | POA: Diagnosis not present

## 2024-08-13 DIAGNOSIS — Z794 Long term (current) use of insulin: Secondary | ICD-10-CM | POA: Diagnosis not present

## 2024-08-13 MED ORDER — DAPAGLIFLOZIN PROPANEDIOL 10 MG PO TABS: 10.0000 mg | ORAL_TABLET | Freq: Every morning | ORAL | 0 refills | Status: AC

## 2024-08-13 MED ORDER — ATORVASTATIN CALCIUM 40 MG PO TABS: ORAL_TABLET | ORAL | 2 refills | Status: AC

## 2024-08-13 MED ORDER — AMLODIPINE BESYLATE 10 MG PO TABS: 10.0000 mg | ORAL_TABLET | Freq: Every day | ORAL | 1 refills | Status: AC

## 2024-08-13 MED ORDER — LOSARTAN POTASSIUM 100 MG PO TABS: 100.0000 mg | ORAL_TABLET | Freq: Every day | ORAL | 1 refills | Status: AC

## 2024-08-13 MED ORDER — LANTUS SOLOSTAR 100 UNIT/ML ~~LOC~~ SOPN: 67.0000 [IU] | PEN_INJECTOR | Freq: Every day | SUBCUTANEOUS | 0 refills | Status: AC

## 2024-08-13 MED ORDER — INSULIN LISPRO (1 UNIT DIAL) 100 UNIT/ML (KWIKPEN): 10.0000 [IU] | PEN_INJECTOR | Freq: Three times a day (TID) | SUBCUTANEOUS | 1 refills | Status: AC

## 2024-08-13 NOTE — Patient Instructions (Signed)

## 2024-08-13 NOTE — Progress Notes (Addendum)
 "  Established Patient Office Visit  Subjective   Patient ID: Edgar [name], male    DOB: [birth year]  Age: 79 y.o. MRN: [id number]  Chief Complaint  Patient presents with   Transitions Of Care    HPI Patient is a pleasant 79 year old male seen in office today. He is a new patient to me. He is hard of hearing and admits he did not wear his hearing aid to today's visit. He brings with him a form that he requests to be completed to allow him to continue to drive. Form provided by the [facility] to the patient.   He also states he needs his insulins refilled but voices there is form that he will bring that I need to sign and his daughter faxes. He admits he forgot the form today but will bring this back to the office. Unsure of what form is referring to.    Review of Systems  Respiratory:  Negative for shortness of breath.   Cardiovascular:  Negative for chest pain.  Neurological:  Negative for dizziness, loss of consciousness and headaches.      Objective:     BP 124/76   Pulse 66   Resp 16   Ht 6' 6 (1.981 m)   Wt 272 lb (123.4 kg)   SpO2 97%   BMI 31.43 kg/m  BP Readings from Last 3 Encounters:  08/13/24 124/76  07/10/24 135/82  04/20/24 120/78   Wt Readings from Last 3 Encounters:  08/13/24 272 lb (123.4 kg)  07/10/24 268 lb (121.6 kg)  04/20/24 267 lb (121.1 kg)      Physical Exam Constitutional:      General: He is not in acute distress.    Appearance: He is obese.  Neck:     Vascular: No carotid bruit.  Cardiovascular:     Rate and Rhythm: Normal rate and regular rhythm.     Heart sounds: Normal heart sounds.  Pulmonary:     Effort: Pulmonary effort is normal. No respiratory distress.     Breath sounds: Normal breath sounds. No wheezing, rhonchi or rales.  Skin:    General: Skin is warm and dry.  Neurological:     General: No focal deficit present.     Mental Status: He is alert and oriented to person, place, and time.  Psychiatric:        Mood and Affect:  Mood normal.        Behavior: Behavior normal.      Last CBC Lab Results  Component Value Date   WBC 8.1 02/21/2024   HGB 14.8 02/21/2024   HCT 44.2 02/21/2024   MCV 85.0 02/21/2024   MCH 28.5 02/21/2024   RDW 14.0 02/21/2024   PLT 211 02/21/2024   Last metabolic panel Lab Results  Component Value Date   GLUCOSE 140 (H) 03/19/2024   NA 140 03/19/2024   K 4.4 03/19/2024   CL 106 03/19/2024   CO2 28 03/19/2024   BUN 12 03/19/2024   CREATININE 0.98 03/19/2024   EGFR 79 03/19/2024   CALCIUM  10.5 (H) 03/19/2024   PHOS 3.5 08/26/2017   PROT 7.2 03/19/2024   ALBUMIN 3.7 02/21/2024   LABGLOB 3.3 12/23/2015   AGRATIO 1.3 12/23/2015   BILITOT 0.6 03/19/2024   ALKPHOS 69 02/21/2024   AST 16 03/19/2024   ALT 17 03/19/2024   ANIONGAP 9 02/21/2024   Last lipids Lab Results  Component Value Date   CHOL 137 08/22/2023   HDL 42  08/22/2023   LDLCALC 81 08/22/2023   TRIG 60 08/22/2023   CHOLHDL 3.3 08/22/2023   Last hemoglobin A1c Lab Results  Component Value Date   HGBA1C 7.8 (H) 03/19/2024          Assessment & Plan:   Assessment & Plan Type 2 diabetes mellitus with hyperglycemia, with long-term current use of insulin  (HCC) Type 2 DM uncontrolled with last A1c of 7.8 in 03/2024. He reports adherence to diabetic regimen including insulin  therapies and Farxiga . He states he takes Insulin  Lispro as directed 10 units TID with meals. He states he takes Lantus  insulin  67 units nightly. Reviewed Lantus  prescription instructions that state he can take 50 to 70 units nightly. Unable to identify reasoning or plan regarding range of 50 to 70 units and patient cannot clarify. As noted, he states he is taking 67 units nightly.   He denies episodes of hypoglycemia. He states his blood sugars range from 150 to 200 typically.   -Update labs -Continue Insulin  Lispro (Humalog ) 10 units subcutaneously TID with meals -Continue Insulin  Glargine (Lantus ) 67 units subcutaneously nightly   -Continue Farxiga  10mg  once daily -F/u in 1 month to review labs and potential medication adjustments pending labs  Orders:   HgB A1c   Comprehensive Metabolic Panel (CMET)   dapagliflozin  propanediol (FARXIGA ) 10 MG TABS tablet; Take 1 tablet (10 mg total) by mouth in the morning.   insulin  lispro (HUMALOG  KWIKPEN) 100 UNIT/ML KwikPen; Inject 10 Units into the skin with breakfast, with lunch, and with evening meal.   insulin  glargine (LANTUS  SOLOSTAR) 100 UNIT/ML Solostar Pen; Inject 67 Units into the skin at bedtime.  Aneurysm of ascending aorta without rupture CTA chest with evidence of aneurysm of the tubular ascending thoracic aorta measuring 4.5cm in 12/2023. Recommended semi-annual follow up and referral to cardiothoracic surgery.  Patient not established with cardiology or cardiothoracic surgery.  -Referral placed to cardiothoracic surgery -CTA chest follow up ordered -Plan for 1 month f/u  Orders:   CT ANGIO CHEST AORTA W/CM & OR WO/CM; Future  Coronary artery disease involving native coronary artery of native heart without angina pectoris Evidence of CAD seen on 12/24/23 CTA chest with impression noting coronary artery atherosclerotic vascular disease.   -Update labs including lipid panel -Continue Atorvastatin  40mg  every night at bedtime Orders:   Lipid Profile   atorvastatin  (LIPITOR) 40 MG tablet; TAKE 1 TABLET BY MOUTH EVERYDAY AT BEDTIME  Hypertension associated with type 2 diabetes mellitus (HCC) HTN stable/controlled, BP 124/76 at today's visit.   -Update labs -Continue Amlodipine  10mg  once daily, refill provided -Continue Losartan  100mg  once daily, refill provided -F/u in 1 month  Orders:   CBC w/Diff/Platelet   Comprehensive Metabolic Panel (CMET)   amLODipine  (NORVASC ) 10 MG tablet; Take 1 tablet (10 mg total) by mouth daily.   losartan  (COZAAR ) 100 MG tablet; Take 1 tablet (100 mg total) by mouth daily.  Mixed hyperlipidemia Hyperlipidemia controlled,  last LDL of 81 in 08/2023 Orders:   Lipid Profile   atorvastatin  (LIPITOR) 40 MG tablet; TAKE 1 TABLET BY MOUTH EVERYDAY AT BEDTIME  Type 2 diabetes mellitus with diabetic nephropathy, with long-term current use of insulin  (HCC) Type E DM with evidence if diabetic nephropathy with microalbumin creatinine ratio of 129. GFR stable at 79, CKD 2. Patient is on ARB and SGLT2 therapies.   -Continue Losartan  100mg  once daily -Continue Farxiga  10mg  daily  -Improve glycemic control. Education provided regarding diabetic diet. Continue diabetic regimen without changes at this time. Planning  for 1 month f/u to review labs and adjust medications accordingly pending labs.  Orders:   losartan  (COZAAR ) 100 MG tablet; Take 1 tablet (100 mg total) by mouth daily.   dapagliflozin  propanediol (FARXIGA ) 10 MG TABS tablet; Take 1 tablet (10 mg total) by mouth in the morning.   insulin  glargine (LANTUS  SOLOSTAR) 100 UNIT/ML Solostar Pen; Inject 67 Units into the skin at bedtime.      Return in about 1 month (around 09/13/2024) for chronic condition follow up .    LAYMON LOISE CORE, FNP "

## 2024-08-13 NOTE — Assessment & Plan Note (Addendum)
 CTA chest with evidence of aneurysm of the tubular ascending thoracic aorta measuring 4.5cm in 12/2023. Recommended semi-annual follow up and referral to cardiothoracic surgery.  Patient not established with cardiology or cardiothoracic surgery.  -Referral placed to cardiothoracic surgery -CTA chest follow up ordered -Plan for 1 month f/u  Orders:   CT ANGIO CHEST AORTA W/CM & OR WO/CM; Future

## 2024-08-13 NOTE — Addendum Note (Signed)
 Addended by: EDITH GRATE on: 08/13/2024 02:07 PM   Modules accepted: Orders

## 2024-08-13 NOTE — Assessment & Plan Note (Addendum)
 Type E DM with evidence if diabetic nephropathy with microalbumin creatinine ratio of 129. GFR stable at 79, CKD 2. Patient is on ARB and SGLT2 therapies.   -Continue Losartan  100mg  once daily -Continue Farxiga  10mg  daily  -Improve glycemic control. Education provided regarding diabetic diet. Continue diabetic regimen without changes at this time. Planning for 1 month f/u to review labs and adjust medications accordingly pending labs.  Orders:   losartan  (COZAAR ) 100 MG tablet; Take 1 tablet (100 mg total) by mouth daily.   dapagliflozin  propanediol (FARXIGA ) 10 MG TABS tablet; Take 1 tablet (10 mg total) by mouth in the morning.   insulin  glargine (LANTUS  SOLOSTAR) 100 UNIT/ML Solostar Pen; Inject 67 Units into the skin at bedtime.

## 2024-08-13 NOTE — Assessment & Plan Note (Addendum)
 HTN stable/controlled, BP 124/76 at today's visit.   -Update labs -Continue Amlodipine  10mg  once daily, refill provided -Continue Losartan  100mg  once daily, refill provided -F/u in 1 month  Orders:   CBC w/Diff/Platelet   Comprehensive Metabolic Panel (CMET)   amLODipine  (NORVASC ) 10 MG tablet; Take 1 tablet (10 mg total) by mouth daily.   losartan  (COZAAR ) 100 MG tablet; Take 1 tablet (100 mg total) by mouth daily.

## 2024-08-13 NOTE — Assessment & Plan Note (Addendum)
 Evidence of CAD seen on 12/24/23 CTA chest with impression noting coronary artery atherosclerotic vascular disease.   -Update labs including lipid panel -Continue Atorvastatin  40mg  every night at bedtime Orders:   Lipid Profile   atorvastatin  (LIPITOR) 40 MG tablet; TAKE 1 TABLET BY MOUTH EVERYDAY AT BEDTIME

## 2024-08-13 NOTE — Assessment & Plan Note (Addendum)
 Hyperlipidemia controlled, last LDL of 81 in 08/2023 Orders:   Lipid Profile   atorvastatin  (LIPITOR) 40 MG tablet; TAKE 1 TABLET BY MOUTH EVERYDAY AT BEDTIME

## 2024-08-14 ENCOUNTER — Ambulatory Visit: Payer: Self-pay | Admitting: Family Medicine

## 2024-08-14 LAB — CBC WITH DIFFERENTIAL/PLATELET
Absolute Lymphocytes: 2037 {cells}/uL (ref 850–3900)
Absolute Monocytes: 476 {cells}/uL (ref 200–950)
Basophils Absolute: 43 {cells}/uL (ref 0–200)
Basophils Relative: 0.7 %
Eosinophils Absolute: 177 {cells}/uL (ref 15–500)
Eosinophils Relative: 2.9 %
HCT: 47.8 % (ref 39.4–51.1)
Hemoglobin: 15.3 g/dL (ref 13.2–17.1)
MCH: 28 pg (ref 27.0–33.0)
MCHC: 32 g/dL (ref 31.6–35.4)
MCV: 87.4 fL (ref 81.4–101.7)
MPV: 9.8 fL (ref 7.5–12.5)
Monocytes Relative: 7.8 %
Neutro Abs: 3367 {cells}/uL (ref 1500–7800)
Neutrophils Relative %: 55.2 %
Platelets: 260 Thousand/uL (ref 140–400)
RBC: 5.47 Million/uL (ref 4.20–5.80)
RDW: 13 % (ref 11.0–15.0)
Total Lymphocyte: 33.4 %
WBC: 6.1 Thousand/uL (ref 3.8–10.8)

## 2024-08-14 LAB — COMPREHENSIVE METABOLIC PANEL WITH GFR
AG Ratio: 1.2 (calc) (ref 1.0–2.5)
ALT: 15 U/L (ref 9–46)
AST: 19 U/L (ref 10–35)
Albumin: 4.1 g/dL (ref 3.6–5.1)
Alkaline phosphatase (APISO): 84 U/L (ref 35–144)
BUN: 14 mg/dL (ref 7–25)
CO2: 28 mmol/L (ref 20–32)
Calcium: 10 mg/dL (ref 8.6–10.3)
Chloride: 105 mmol/L (ref 98–110)
Creat: 0.9 mg/dL (ref 0.70–1.28)
Globulin: 3.5 g/dL (ref 1.9–3.7)
Glucose, Bld: 98 mg/dL (ref 65–99)
Potassium: 4.3 mmol/L (ref 3.5–5.3)
Sodium: 140 mmol/L (ref 135–146)
Total Bilirubin: 0.8 mg/dL (ref 0.2–1.2)
Total Protein: 7.6 g/dL (ref 6.1–8.1)
eGFR: 88 mL/min/1.73m2

## 2024-08-14 LAB — LIPID PANEL
Cholesterol: 119 mg/dL
HDL: 40 mg/dL
LDL Cholesterol (Calc): 66 mg/dL
Non-HDL Cholesterol (Calc): 79 mg/dL
Total CHOL/HDL Ratio: 3 (calc)
Triglycerides: 57 mg/dL

## 2024-08-14 LAB — HEMOGLOBIN A1C
Hgb A1c MFr Bld: 6.7 % — ABNORMAL HIGH
Mean Plasma Glucose: 146 mg/dL
eAG (mmol/L): 8.1 mmol/L

## 2024-08-24 ENCOUNTER — Encounter

## 2024-08-25 NOTE — Progress Notes (Unsigned)
 "   08/26/2024 Edgar [name] [id number] 08-01-1946  Gastroenterology Office Note     Primary Care Physician:  Edith Laymon SAILOR, FNP  Primary GI Provider: Celestia Rima, NP; Jinny Carmine, MD    Chief Complaint   Chief Complaint  Patient presents with   Follow-up    Constipation -much improvement-BM's sometime 3 a day-     History of Present Illness   Edgar [name] is a 79 y.o. male with PMHX of HTN, diabetes, obesity presenting today for follow-up  Discussed the use of AI scribe software for clinical note transcription with the patient, who gave verbal consent to proceed.  Bowel habits have significantly improved since the last visit, with movements occurring at least once daily, sometimes twice, and described as working normal. No consistent use of medications for constipation, including fiber supplements or Miralax , as bowel function has normalized. He previously used medication when his bowels were not moving but does not require it currently. Patient does not recall receiving Linzess samples after last visit, he is unsure if his daughter has them or not.   Denies abdominal pain and blood in stool. Remains active and busy in retirement, often away from home.  Patient last seen by myself on 07/10/2024 for chronic constipation.  Patient to trial Linzess.   07/10/2024 Abdominal x-ray with moderate stool burden.     Past Medical History:  Diagnosis Date   Aneurysm of aorta 09/11/2016   Noted on CT scan Sept 2017   Aneurysm of thoracic aorta 09/24/2017   Closed intertrochanteric fracture of left hip, sequela 05/30/2016   Coronary artery disease 10/25/2017   CT scan March 2019   Diabetes mellitus with diabetic nephropathy (HCC)    Diabetes mellitus without complication (HCC)    Essential hypertension, benign 12/23/2015   History of colonoscopy with polypectomy 12/07/2016   Polypectomy x 12 per patient   HOH (hard of hearing)    Hx of pulmonary embolus 05/18/2016    Hospitalization Sept 2017, following hip fracture   Hx of tobacco use, presenting hazards to health 12/23/2015   Quit prior to 1997   Hx of tobacco use, presenting hazards to health 12/23/2015   Quit prior to 1997    Hyperlipidemia 12/23/2015   Hypertension    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   Obesity 12/23/2015   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   Pulmonary embolism (HCC) 05/18/2016   Hospitalization Sept 2017, following hip fracture   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   left ant lat thigh, SCCis in a SK, schedule EDC   Squamous cell carcinoma of skin 08/14/2021   L ant lat thigh, EDC 09/07/21   Tinea pedis 06/25/2016   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     Past Surgical History:  Procedure Laterality Date   CATARACT EXTRACTION W/PHACO Right 11/26/2023   Procedure: PHACOEMULSIFICATION, CATARACT, WITH IOL INSERTION 6.85 00:43.9;  Surgeon: Jaye Fallow, MD;  Location: [facility];  Service: Ophthalmology;  Laterality: Right;   CIRCUMCISION N/A 03/26/2022   Procedure: CIRCUMCISION ADULT;  Surgeon: Penne Knee, MD;  Location: [facility];  Service: Urology;  Laterality: N/A;   COLONOSCOPY WITH PROPOFOL  N/A 01/15/2017   Procedure: COLONOSCOPY WITH PROPOFOL ;  Surgeon: Therisa Bi, MD;  Location: [facility];  Service: Endoscopy;  Laterality: N/A;   COLONOSCOPY WITH PROPOFOL  N/A 12/17/2019   Procedure: COLONOSCOPY WITH PROPOFOL ;  Surgeon: Therisa Bi, MD;  Location: [facility];  Service: Gastroenterology;  Laterality: N/A;   EYE SURGERY  Bilateral    cataract surgery   FRACTURE SURGERY     INTRAMEDULLARY (IM) NAIL INTERTROCHANTERIC Left 04/14/2016   Procedure: INTRAMEDULLARY (IM) NAIL INTERTROCHANTRIC;  Surgeon: Norleen JINNY Maltos, MD;  Location: [facility];  Service: Orthopedics;  Laterality: Left;   JOINT REPLACEMENT Left 04/2016   TOTAL HIP ARTHROPLASTY      Current Outpatient Medications  Medication Sig Dispense Refill    amLODipine  (NORVASC ) 10 MG tablet Take 1 tablet (10 mg total) by mouth daily. 90 tablet 1   aspirin  EC 81 MG tablet Take 81 mg by mouth daily. Swallow whole.     atorvastatin  (LIPITOR) 40 MG tablet TAKE 1 TABLET BY MOUTH EVERYDAY AT BEDTIME 90 tablet 2   Cholecalciferol  (VITAMIN D ) 50 MCG (2000 UT) CAPS Take 1 capsule by mouth daily.     dapagliflozin  propanediol (FARXIGA ) 10 MG TABS tablet Take 1 tablet (10 mg total) by mouth in the morning. 90 tablet 0   Glucose Blood (BLOOD GLUCOSE TEST STRIPS 333) STRP Test BG Tid (what ins. Covers/machine) Dx E11.65 100 strip 5   GVOKE HYPOPEN  2-PACK 1 MG/0.2ML SOAJ Inject 1 mg into the skin as needed (for hypoglycemia episodes (glucose below 60 and symptomatic)). 0.4 mL 1   insulin  glargine (LANTUS  SOLOSTAR) 100 UNIT/ML Solostar Pen Inject 67 Units into the skin at bedtime. 30 mL 0   insulin  lispro (HUMALOG  KWIKPEN) 100 UNIT/ML KwikPen Inject 10 Units into the skin with breakfast, with lunch, and with evening meal. 15 mL 1   Insulin  Pen Needle 31G X 5 MM MISC To inject insulin  QID, DX E11.65 360 each 2   losartan  (COZAAR ) 100 MG tablet Take 1 tablet (100 mg total) by mouth daily. 90 tablet 1   MAGnesium -Oxide 400 (240 Mg) MG tablet Take 1 tablet (400 mg total) by mouth daily as needed. 90 tablet 1   Omega-3 Fatty Acids (FISH OIL ) 1000 MG CAPS Take 1 capsule by mouth daily.     polyethylene glycol powder (GLYCOLAX /MIRALAX ) 17 GM/SCOOP powder Take 17 g by mouth daily as needed for moderate constipation or severe constipation. Until daily soft stools 578 g 2   Trospium  Chloride 60 MG CP24 Take 1 capsule (60 mg total) by mouth daily. 90 capsule 3   No current facility-administered medications for this visit.    Allergies as of 08/26/2024   (No Known Allergies)    Family History  Problem Relation Age of Onset   Cancer Mother    Diabetes Mother    Heart disease Father    Alcohol abuse Brother     Social History   Socioeconomic History   Marital  status: Widowed    Spouse name: Not on file   Number of children: Not on file   Years of education: Not on file   Highest education level: 12th grade  Occupational History   Not on file  Tobacco Use   Smoking status: Former    Current packs/day: 0.00    Types: Cigarettes    Quit date: 09/01/1978    Years since quitting: 46.0   Smokeless tobacco: Never  Vaping Use   Vaping status: Never Used  Substance and Sexual Activity   Alcohol use: No    Alcohol/week: 0.0 standard drinks of alcohol   Drug use: No   Sexual activity: Not Currently  Other Topics Concern   Not on file  Social History Narrative   Pt's daughter lives with him   Social Drivers of Health   Tobacco Use: Medium  Risk (08/26/2024)   Patient History    Smoking Tobacco Use: Former    Smokeless Tobacco Use: Never    Passive Exposure: Not on file  Financial Resource Strain: Patient Declined (08/13/2024)   Overall Financial Resource Strain (CARDIA)    Difficulty of Paying Living Expenses: Patient declined  Food Insecurity: No Food Insecurity (08/13/2024)   Epic    Worried About Programme Researcher, Broadcasting/film/video in the Last Year: Never true    Ran Out of Food in the Last Year: Never true  Transportation Needs: No Transportation Needs (08/13/2024)   Epic    Lack of Transportation (Medical): No    Lack of Transportation (Non-Medical): No  Physical Activity: Insufficiently Active (08/13/2024)   Exercise Vital Sign    Days of Exercise per Week: 2 days    Minutes of Exercise per Session: 30 min  Stress: No Stress Concern Present (08/13/2024)   [employer] of Occupational Health - Occupational Stress Questionnaire    Feeling of Stress: Only a little  Social Connections: Socially Isolated (08/13/2024)   Social Connection and Isolation Panel    Frequency of Communication with Friends and Family: More than three times a week    Frequency of Social Gatherings with Friends and Family: Once a week    Attends Religious Services: Patient  declined    Database Administrator or Organizations: No    Attends Engineer, Structural: Not on file    Marital Status: Widowed  Intimate Partner Violence: Not At Risk (08/13/2024)   Epic    Fear of Current or Ex-Partner: No    Emotionally Abused: No    Physically Abused: No    Sexually Abused: No  Depression (PHQ2-9): Low Risk (08/13/2024)   Depression (PHQ2-9)    PHQ-2 Score: 0  Alcohol Screen: Low Risk (08/13/2024)   Alcohol Screen    Last Alcohol Screening Score (AUDIT): 0  Housing: Low Risk (08/13/2024)   Epic    Unable to Pay for Housing in the Last Year: No    Number of Times Moved in the Last Year: 0    Homeless in the Last Year: No  Utilities: Not At Risk (08/13/2024)   Epic    Threatened with loss of utilities: No  Health Literacy: Adequate Health Literacy (08/13/2024)   B1300 Health Literacy    Frequency of need for help with medical instructions: Never     RELEVANT GI HISTORY, IMAGING AND LABS: CBC    Component Value Date/Time   WBC 6.1 08/13/2024 1158   RBC 5.47 08/13/2024 1158   HGB 15.3 08/13/2024 1158   HCT 47.8 08/13/2024 1158   PLT 260 08/13/2024 1158   MCV 87.4 08/13/2024 1158   MCH 28.0 08/13/2024 1158   MCHC 32.0 08/13/2024 1158   RDW 13.0 08/13/2024 1158   LYMPHSABS 2.8 02/21/2024 1804   MONOABS 0.7 02/21/2024 1804   EOSABS 177 08/13/2024 1158   BASOSABS 43 08/13/2024 1158   Recent Labs    02/21/24 1804 08/13/24 1158  HGB 14.8 15.3    CMP     Component Value Date/Time   NA 140 08/13/2024 1158   NA 139 01/06/2016 0918   K 4.3 08/13/2024 1158   CL 105 08/13/2024 1158   CO2 28 08/13/2024 1158   GLUCOSE 98 08/13/2024 1158   BUN 14 08/13/2024 1158   BUN 16 01/06/2016 0918   CREATININE 0.90 08/13/2024 1158   CALCIUM  10.0 08/13/2024 1158   PROT 7.6 08/13/2024 1158   PROT  7.5 12/23/2015 0959   ALBUMIN 3.7 02/21/2024 1804   ALBUMIN 3.8 01/06/2016 0918   AST 19 08/13/2024 1158   ALT 15 08/13/2024 1158   ALKPHOS 69 02/21/2024 1804    BILITOT 0.8 08/13/2024 1158   BILITOT 0.5 12/23/2015 0959   GFRNONAA >60 02/21/2024 1804   GFRNONAA 69 08/01/2020 0946   GFRAA 80 08/01/2020 0946      Latest Ref Rng & Units 08/13/2024   11:58 AM 03/19/2024    9:53 AM 02/21/2024    6:04 PM  Hepatic Function  Total Protein 6.1 - 8.1 g/dL 7.6  7.2  7.9   Albumin 3.5 - 5.0 g/dL   3.7   AST 10 - 35 U/L 19  16  20    ALT 9 - 46 U/L 15  17  16    Alk Phosphatase 38 - 126 U/L   69   Total Bilirubin 0.2 - 1.2 mg/dL 0.8  0.6  1.0       Review of Systems   All systems reviewed and negative except where noted in HPI.    Physical Exam  BP 127/82   Pulse 72   Temp 97.7 F (36.5 C)   Ht 6' 6 (1.981 m)   Wt 271 lb (122.9 kg)   SpO2 99%   BMI 31.32 kg/m  No LMP for male patient. General:   Alert and oriented. Pleasant and cooperative. Well-nourished and well-developed. In no acute distress.  Head:  Normocephalic and atraumatic. Eyes:  Without icterus Ears:  Normal auditory acuity. Abdomen:  Normal bowel sounds.  No bruits.  Soft, non-tender and non-distended without masses, hepatosplenomegaly or hernias noted.  No guarding or rebound tenderness.     Rectal:  Deferred. Msk:  Symmetrical without gross deformities. Normal posture. Extremities:  Without edema. Neurologic:  Alert and  oriented x4;  grossly normal neurologically. Skin:  Intact without significant lesions or rashes. Psych:  Alert and cooperative. Normal mood and affect.   Assessment & Plan   Edgar [name] is a 79 y.o. male presenting today for follow up on constipation.  Patient reports bowels are moving daily now and previous stool burden has resolved.  Patient is taking fiber and MiraLAX  as needed, instructed patient to continue fiber and MiraLAX  on a more consistent basis.  Patient given samples of Linzess 72 mcg to have on hand in case constipation returns.   Will follow-up in 6 months.   Grayce Bohr, DNP, AGNP-C [facility]   "

## 2024-08-26 ENCOUNTER — Encounter: Payer: Self-pay | Admitting: Family Medicine

## 2024-08-26 ENCOUNTER — Ambulatory Visit: Admitting: Family Medicine

## 2024-08-26 VITALS — BP 127/82 | HR 72 | Temp 97.7°F | Ht 78.0 in | Wt 271.0 lb

## 2024-08-26 DIAGNOSIS — Z09 Encounter for follow-up examination after completed treatment for conditions other than malignant neoplasm: Secondary | ICD-10-CM | POA: Diagnosis not present

## 2024-08-26 DIAGNOSIS — K5900 Constipation, unspecified: Secondary | ICD-10-CM

## 2024-08-26 NOTE — Patient Instructions (Signed)
 Given Samples of Linzess. Linzess works best when taken once a day every day, on an empty stomach, at least 30 minutes before your first meal of the day.  When Linzess is taken daily as directed:  *Constipation relief is typically felt in about a week Diarrhea may occur in the first 2 weeks -keep taking it.  The diarrhea should go away and you should start having normal, complete, full bowel movements. It may be helpful to start treatment when you can be near the comfort of your own bathroom, such as a weekend.    Start OTC Benefiber Powder. Mix 1 - 2 Tablespoons in 6 - 8 ounces of a Drink Once Daily. Drink 64 ounces of water / fluids Daily.   For constipation: Start OTC Miralax  Powder Mix 1 capful in 6 to 8 ounces of a drink once daily  Recommend high-fiber diet, 30 g of fiber daily Eat fruits, vegetables, and whole grains

## 2024-08-28 ENCOUNTER — Ambulatory Visit: Payer: Self-pay | Admitting: Urology

## 2024-08-30 NOTE — Progress Notes (Unsigned)
 "    09/01/2024 6:53 AM   Edgar [name] [birth year] [id number]  Referring provider: Leavy Mole, PA-C No address on file  Urological history: 1. 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 BPH with LU TS -PSA (08/2022) 2.60 -Myrbetriq  not covered by insurance - trospium  XL 60 mg  No chief complaint on file.  HPI: Edgar [name] is a 79 y.o. man who presents today for yearly follow up for BPH with LU TS.    Previous records reviewed.   I PSS ***  - Irritative symptoms: frequency, urgency, nocturia  ___*** - Obstructive symptoms: weak stream, hesitancy, intermittency, straining, incomplete emptying *** - Incontinence: none/post-void dribbling/urge incontinence/stress incontinence/mixed incontinence, and # of pads *** - Symptom progression: stable / worsening *** - Current therapy: ?-blocker? 5-ARI? (document) *** - Response to therapy: partial / good / inadequate *** - Denies: gross hematuria, dysuria, flank pain, fever, chills, retention. 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   Closed intertrochanteric fracture of left hip, sequela 05/30/2016   Coronary artery disease 10/25/2017   CT scan March 2019   Diabetes mellitus with diabetic nephropathy (HCC)    Diabetes mellitus without complication (HCC)    Essential hypertension, benign 12/23/2015   History of colonoscopy with polypectomy 12/07/2016   Polypectomy x 12 per patient   HOH (hard of hearing)    Hx of pulmonary embolus 05/18/2016   Hospitalization Sept 2017, following hip fracture   Hx of tobacco use, presenting hazards to health 12/23/2015   Quit prior to 1997   Hx of tobacco use, presenting hazards to health 12/23/2015   Quit prior to 1997     Hyperlipidemia 12/23/2015   Hypertension    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   Obesity 12/23/2015   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   Pulmonary embolism (HCC) 05/18/2016   Hospitalization Sept 2017, following hip fracture   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   left ant lat thigh, SCCis in a SK, schedule EDC   Squamous cell carcinoma of skin 08/14/2021   L ant lat thigh, EDC 09/07/21   Tinea pedis 06/25/2016   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     Surgical History: Past Surgical History:  Procedure Laterality Date   CATARACT EXTRACTION W/PHACO Right 11/26/2023   Procedure: PHACOEMULSIFICATION, CATARACT, WITH IOL INSERTION 6.85 00:43.9;  Surgeon: Jaye Fallow, MD;  Location: [facility];  Service: Ophthalmology;  Laterality: Right;   CIRCUMCISION N/A 03/26/2022   Procedure: CIRCUMCISION ADULT;  Surgeon: Penne Knee, MD;  Location: [facility];  Service: Urology;  Laterality: N/A;   COLONOSCOPY WITH PROPOFOL  N/A 01/15/2017   Procedure: COLONOSCOPY WITH PROPOFOL ;  Surgeon: Therisa Bi, MD;  Location: [facility];  Service: Endoscopy;  Laterality: N/A;   COLONOSCOPY WITH PROPOFOL  N/A 12/17/2019   Procedure: COLONOSCOPY WITH PROPOFOL ;  Surgeon: Therisa Bi, MD;  Location: [facility];  Service: Gastroenterology;  Laterality: N/A;   EYE SURGERY Bilateral    cataract surgery   FRACTURE SURGERY     INTRAMEDULLARY (IM) NAIL INTERTROCHANTERIC Left 04/14/2016   Procedure: INTRAMEDULLARY (IM) NAIL INTERTROCHANTRIC;  Surgeon: Norleen JINNY Maltos, MD;  Location: [facility];  Service: Orthopedics;  Laterality: Left;   JOINT REPLACEMENT Left  04/2016   TOTAL HIP ARTHROPLASTY      Home Medications:  Allergies as of 09/01/2024   No Known Allergies      Medication List        Accurate as of August 30, 2024  6:53 AM. If you have any questions, ask your nurse or doctor.          amLODipine  10 MG tablet Commonly known as: NORVASC  Take 1  tablet (10 mg total) by mouth daily.   aspirin  EC 81 MG tablet Take 81 mg by mouth daily. Swallow whole.   atorvastatin  40 MG tablet Commonly known as: LIPITOR TAKE 1 TABLET BY MOUTH EVERYDAY AT BEDTIME   Blood Glucose Test Strips 333 Strp Test BG Tid (what ins. Covers/machine) Dx E11.65   dapagliflozin  propanediol 10 MG Tabs tablet Commonly known as: Farxiga  Take 1 tablet (10 mg total) by mouth in the morning.   Fish Oil  1000 MG Caps Take 1 capsule by mouth daily.   Gvoke HypoPen  2-Pack 1 MG/0.2ML Soaj Generic drug: Glucagon  Inject 1 mg into the skin as needed (for hypoglycemia episodes (glucose below 60 and symptomatic)).   insulin  lispro 100 UNIT/ML KwikPen Commonly known as: HumaLOG  KwikPen Inject 10 Units into the skin with breakfast, with lunch, and with evening meal.   Insulin  Pen Needle 31G X 5 MM Misc To inject insulin  QID, DX E11.65   Lantus  SoloStar 100 UNIT/ML Solostar Pen Generic drug: insulin  glargine Inject 67 Units into the skin at bedtime.   losartan  100 MG tablet Commonly known as: COZAAR  Take 1 tablet (100 mg total) by mouth daily.   magnesium  oxide 400 (240 Mg) MG tablet Commonly known as: MAGnesium -Oxide Take 1 tablet (400 mg total) by mouth daily as needed.   polyethylene glycol powder 17 GM/SCOOP powder Commonly known as: GLYCOLAX /MIRALAX  Take 17 g by mouth daily as needed for moderate constipation or severe constipation. Until daily soft stools   Trospium  Chloride 60 MG Cp24 Take 1 capsule (60 mg total) by mouth daily.   Vitamin D  50 MCG (2000 UT) Caps Take 1 capsule by mouth daily.        Allergies: No Known Allergies  Family History: Family History  Problem Relation Age of Onset   Cancer Mother    Diabetes Mother    Heart disease Father    Alcohol abuse Brother     Social History:  reports that he quit smoking about 46 years ago. His smoking use included cigarettes. He has never used smokeless tobacco. He reports that he  does not drink alcohol and does not use drugs.  ROS: Pertinent ROS in HPI  Physical Exam: There were no vitals taken for this visit.  Constitutional:  Well nourished. Alert and oriented, No acute distress. HEENT: [place] AT, moist mucus membranes.  Trachea midline, no masses. Cardiovascular: No clubbing, cyanosis, or edema. Respiratory: Normal respiratory effort, no increased work of breathing. Neurologic: Grossly intact, no focal deficits, moving all 4 extremities. Psychiatric: Normal mood and affect.   Laboratory Data: See Epic and HPI   I have reviewed the labs.  See HPI.     Pertinent Imaging: ***  Assessment & Plan:    1. BPH with LU TS  - moderate symptoms; no red flags; PVR acceptable. *** - continue / adjust / initiate tadalafil 5 mg daily *** - Continue / adjust / initiate ?-blocker for symptom relief. *** - Consider 5-ARI if prostate >40 g or PSA >1.5.*** - Discussed combination therapy if symptoms remain  moderate-severe.*** - Behavioral strategies: reduce evening fluids, avoid bladder irritants, timed/double voiding.*** - Cannot tolerate medication or medication failure, schedule cystoscopy *** - educated on red flag symptoms: acute retention, gross hematuria, fever, severe pain - advised to call clinic or go to the ED if these occur - return to clinic in *** symptom re-evaluation ***  No follow-ups on file.  These notes generated with voice recognition software. I apologize for typographical errors.  CLOTILDA HELON RIGGERS  [facility] [address]  [address] [place], [place] [address] [phone]   "

## 2024-09-01 ENCOUNTER — Ambulatory Visit: Admitting: Urology

## 2024-09-01 DIAGNOSIS — N138 Other obstructive and reflux uropathy: Secondary | ICD-10-CM

## 2024-09-10 ENCOUNTER — Ambulatory Visit: Admitting: Podiatry

## 2024-09-10 NOTE — Progress Notes (Unsigned)
"  °  Subjective:  Patient ID: Edgar [name], male    DOB: Oct 17, 1945,  MRN: [id number]  Edgar [name] presents to clinic today for {jgcomplaint:23593}  Chief Complaint  Patient presents with   Diabetes    A1c 6.7 He saw NP Wall in Jan 2026   New problem(s): None. {jgcomplaint:23593}  PCP is Wall, Laymon SAILOR, FNP.  Allergies[1]  Review of Systems: Negative except as noted in the HPI.  Objective: No changes noted in today's physical examination. There were no vitals filed for this visit. Edgar [name] is a pleasant 79 y.o. male {jgbodyhabitus:24098} AAO x 3.  Assessment/Plan: No diagnosis found.  No orders of the defined types were placed in this encounter.   None {Jgplan:23602::-Patient/POA to call should there be question/concern in the interim.}   No follow-ups on file.  Edgar [name] Merlin, DPM      [facility] LOCATION: [address] [place] [address]                   Office [phone]   [facility] LOCATION: [address] [place], [place] [address] Office [phone]     [1] No Known Allergies  "

## 2024-09-11 ENCOUNTER — Ambulatory Visit

## 2024-09-14 ENCOUNTER — Ambulatory Visit: Admitting: Family Medicine

## 2024-09-15 ENCOUNTER — Ambulatory Visit: Admitting: Urology

## 2024-09-15 ENCOUNTER — Encounter: Admitting: Nurse Practitioner

## 2024-09-18 ENCOUNTER — Ambulatory Visit

## 2024-09-24 ENCOUNTER — Encounter

## 2024-12-10 ENCOUNTER — Ambulatory Visit: Admitting: Podiatry

## 2024-12-29 ENCOUNTER — Ambulatory Visit (INDEPENDENT_AMBULATORY_CARE_PROVIDER_SITE_OTHER): Admitting: Vascular Surgery

## 2025-02-23 ENCOUNTER — Ambulatory Visit: Admitting: Family Medicine

## 2025-04-29 ENCOUNTER — Ambulatory Visit: Admitting: Dermatology
